# Patient Record
Sex: Male | Born: 1945 | Race: Black or African American | Hispanic: No | Marital: Married | Smoking: Former smoker
Health system: Southern US, Community
[De-identification: ages and names within clinical notes are randomized; demographics above are authoritative.]

## PROBLEM LIST (undated history)

## (undated) DIAGNOSIS — I509 Heart failure, unspecified: Secondary | ICD-10-CM

## (undated) DIAGNOSIS — I10 Essential (primary) hypertension: Secondary | ICD-10-CM

## (undated) DIAGNOSIS — E7800 Pure hypercholesterolemia, unspecified: Secondary | ICD-10-CM

## (undated) DIAGNOSIS — E119 Type 2 diabetes mellitus without complications: Secondary | ICD-10-CM

## (undated) DIAGNOSIS — R600 Localized edema: Secondary | ICD-10-CM

## (undated) DIAGNOSIS — K635 Polyp of colon: Secondary | ICD-10-CM

## (undated) DIAGNOSIS — K760 Fatty (change of) liver, not elsewhere classified: Secondary | ICD-10-CM

## (undated) HISTORY — DX: Fatty (change of) liver, not elsewhere classified: K76.0

## (undated) HISTORY — PX: COLONOSCOPY: SHX174

## (undated) HISTORY — DX: Localized edema: R60.0

## (undated) HISTORY — PX: HERNIA REPAIR: SHX51

---

## 2005-10-16 ENCOUNTER — Ambulatory Visit (HOSPITAL_COMMUNITY): Admission: RE | Admit: 2005-10-16 | Discharge: 2005-10-16 | Payer: Self-pay | Admitting: Internal Medicine

## 2005-10-16 ENCOUNTER — Encounter (INDEPENDENT_AMBULATORY_CARE_PROVIDER_SITE_OTHER): Payer: Self-pay | Admitting: Specialist

## 2005-10-16 ENCOUNTER — Ambulatory Visit: Payer: Self-pay | Admitting: Internal Medicine

## 2005-11-24 ENCOUNTER — Encounter: Payer: Self-pay | Admitting: Cardiology

## 2005-11-25 ENCOUNTER — Observation Stay (HOSPITAL_COMMUNITY): Admission: AD | Admit: 2005-11-25 | Discharge: 2005-11-26 | Payer: Self-pay | Admitting: Cardiology

## 2005-11-25 ENCOUNTER — Ambulatory Visit: Payer: Self-pay | Admitting: Cardiology

## 2005-11-25 ENCOUNTER — Ambulatory Visit: Payer: Self-pay | Admitting: Internal Medicine

## 2005-11-26 ENCOUNTER — Encounter: Payer: Self-pay | Admitting: Internal Medicine

## 2005-11-26 ENCOUNTER — Encounter: Payer: Self-pay | Admitting: Cardiology

## 2010-02-21 ENCOUNTER — Encounter: Payer: Self-pay | Admitting: Physician Assistant

## 2010-02-21 ENCOUNTER — Encounter: Payer: Self-pay | Admitting: Cardiology

## 2010-02-23 ENCOUNTER — Encounter: Payer: Self-pay | Admitting: Cardiology

## 2010-02-24 ENCOUNTER — Ambulatory Visit: Payer: Self-pay | Admitting: Cardiology

## 2010-04-21 DIAGNOSIS — E782 Mixed hyperlipidemia: Secondary | ICD-10-CM | POA: Insufficient documentation

## 2010-04-21 DIAGNOSIS — E7800 Pure hypercholesterolemia, unspecified: Secondary | ICD-10-CM | POA: Insufficient documentation

## 2010-04-21 DIAGNOSIS — I10 Essential (primary) hypertension: Secondary | ICD-10-CM | POA: Insufficient documentation

## 2010-04-21 DIAGNOSIS — E119 Type 2 diabetes mellitus without complications: Secondary | ICD-10-CM | POA: Insufficient documentation

## 2010-04-21 DIAGNOSIS — E876 Hypokalemia: Secondary | ICD-10-CM | POA: Insufficient documentation

## 2010-04-21 DIAGNOSIS — E785 Hyperlipidemia, unspecified: Secondary | ICD-10-CM

## 2010-04-21 DIAGNOSIS — I2510 Atherosclerotic heart disease of native coronary artery without angina pectoris: Secondary | ICD-10-CM | POA: Insufficient documentation

## 2010-04-21 DIAGNOSIS — E1100 Type 2 diabetes mellitus with hyperosmolarity without nonketotic hyperglycemic-hyperosmolar coma (NKHHC): Secondary | ICD-10-CM | POA: Insufficient documentation

## 2010-04-21 DIAGNOSIS — Z87891 Personal history of nicotine dependence: Secondary | ICD-10-CM | POA: Insufficient documentation

## 2010-04-21 DIAGNOSIS — Z9119 Patient's noncompliance with other medical treatment and regimen: Secondary | ICD-10-CM | POA: Insufficient documentation

## 2010-04-22 ENCOUNTER — Encounter (INDEPENDENT_AMBULATORY_CARE_PROVIDER_SITE_OTHER): Payer: Self-pay | Admitting: *Deleted

## 2010-07-31 NOTE — Consult Note (Signed)
Summary: CARDIOLOGY CONSULT/ [place] [facility]/ [place]   Imported By: Delfino Lovett 04/21/2010 16:53:17  _____________________________________________________________________  External Attachment:    Type:   Image     Comment:   External Document

## 2010-07-31 NOTE — Letter (Signed)
Summary: Appointment - Missed  [facility] [place]. [address] [place] [address]   Phone: [phone]  Fax: [phone]     April 22, 2010 MRN: [id number]   [facility] [place], [place]  [address]   Dear Gerald Evans,  Our records indicate you missed your appointment on        04/22/10                with Dr.    Dannielle Burn         It is very important that we reach you to reschedule this appointment. We look forward to participating in your health care needs. Please contact us at the number listed above at your earliest convenience to reschedule this appointment.     Sincerely,    Public relations account executive

## 2010-07-31 NOTE — Letter (Signed)
Summary: Discharge Summary  Discharge Summary   Imported By: Bartholomew Boards 04/21/2010 16:46:33  _____________________________________________________________________  External Attachment:    Type:   Image     Comment:   External Document

## 2010-07-31 NOTE — Consult Note (Signed)
Summary: CARDIOLOGY CONSULT/ [place] [facility]/ [place]   Imported By: Delfino Lovett 04/21/2010 16:52:54  _____________________________________________________________________  External Attachment:    Type:   Image     Comment:   External Document

## 2010-07-31 NOTE — Letter (Signed)
Summary: Discharge Summary/ [facility]  Discharge Summary/ [facility]   Imported By: Bartholomew Boards 04/21/2010 16:48:49  _____________________________________________________________________  External Attachment:    Type:   Image     Comment:   External Document

## 2010-07-31 NOTE — Cardiovascular Report (Signed)
Summary: Cardiac Catheterization  Cardiac Catheterization   Imported By: Bartholomew Boards 04/21/2010 16:44:53  _____________________________________________________________________  External Attachment:    Type:   Image     Comment:   External Document

## 2010-11-14 NOTE — Op Note (Signed)
Gerald Evans, Gerald Evans               ACCOUNT NO.:  [id number]   MEDICAL RECORD NO.:  [id number]          PATIENT TYPE:  AMB   LOCATION:  DAY                           FACILITY:  [facility]   PHYSICIAN:  Hildred Laser, M.D.    DATE OF BIRTH:  1946/05/10   DATE OF PROCEDURE:  10/16/2005  DATE OF DISCHARGE:                                 OPERATIVE REPORT   PROCEDURE PERFORMED:  Colonoscopy with polypectomy.   INDICATIONS FOR PROCEDURE:  Gerald Evans is a 65 year old African-American male  who was undergoing screening colonoscopy.  Family history is negative for  colorectal cancer.  Procedure risks were reviewed with the patient and  informed consent was obtained.   MEDS FOR CONSCIOUS SEDATION:  Demerol 50 mg IV, Versed 4 mg IV.   DESCRIPTION OF PROCEDURE:  Procedure performed in endoscopy suite.  Patient's vital signs and oxygen saturation were monitored during the  procedure and remained stable.  Patient was placed left lateral position and  rectal examination performed.  No abnormality noted on external or digital  exam.  Olympus video scope was placed in rectum and advanced under vision  into sigmoid colon and beyond.  Preparation was excellent.  Redundant colon.  The scope was easily passed to the cecum which was identified by the  ileocecal valve and appendiceal orifice.  The cecum was normal.  As the  scope was withdrawn, colonic  mucosa was carefully examined.  There was a  sessile polyp with bilobed appearance at __________.  There were two tiny  diverticula seen on the way in and a small lipoma approximately of proximal  sigmoid colon.  As the scope was withdrawn, colonic mucosa was carefully  examined.  A 5 mm polyp was snared from the distal descending colon and  another 7 to 8 mm sessile polyp was snared from the sigmoid colon.  Both of  these polyps were retrieved but they broke into pieces as they were  suctioned through the channel.  Mucosa of the rest of the colon was normal.  Rectal mucosa similarly was normal.  Scope was retroflexed to examine  anorectal junction and small hemorrhoids were noted below the dentate line.  Endoscope was straightened and withdrawn.  The patient tolerated the  procedure well.   FINAL DIAGNOSES:  1.  Examination performed to cecum.  2.  A 5 mm polyp snared from descending colon.  3.  7 to 8 mm polyp snared from sigmoid colon.  4.  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  5.  External hemorrhoids.   RECOMMENDATIONS:  1.  Standard instructions given.  2.  High fiber diet.  3.  I will be contacting the patient with biopsy results and further      recommendations.  Since both of these polyps appear to be adenomatous,      he will need to return in five years.  We will make final      recommendations after histology reviewed.      Hildred Laser, M.D.  Electronically Signed     NR/MEDQ  D:  10/16/2005  T:  10/17/2005  Job:  [id number]   cc:   Matthias Hughs  Fax: [phone]

## 2010-11-14 NOTE — Cardiovascular Report (Signed)
[name], [name]               ACCOUNT NO.:  [id number]   MEDICAL RECORD NO.:  [id number]          PATIENT TYPE:  INP   LOCATION:  6529                         FACILITY:  [facility]   PHYSICIAN:  Glori Bickers, M.D. LHCDATE OF BIRTH:  July 22, 1945   DATE OF PROCEDURE:  11/26/2005  DATE OF DISCHARGE:                              CARDIAC CATHETERIZATION   PRIMARY CARE PHYSICIAN:  Matthias Hughs, M.D. in [place].   CARDIOLOGIST:  Ernestine Mcmurray, M.D.   PATIENT IDENTIFICATION:  Gerald Evans is a delightful 65 year old male with a  history of hyperlipidemia, hypertension and recent onset of diabetes.  He  has had a week or two of substernal chest discomfort, as well as some  dyspnea on exertion.  He was admitted to [facility].  He ruled out for  myocardial infarction with serial cardiac markers.  EKG was nonacute.  He  was referred for cardiac catheterization.   PROCEDURES PERFORMED:  1.  Selective coronary angiography.  2.  Left heart cath.  3.  Left ventriculogram.  4.  Angio-Seal closure device.   DESCRIPTION OF PROCEDURE:  The risks and benefits of the catheterization  were explained.  Consent was signed and placed in the chart.  A 6-French  arterial sheath was placed in the right femoral artery using a modified  Seldinger technique.  Standard catheters including preformed Judkins JL-4,  JR-4 and angled pigtail were used for the catheterization.  All catheter  exchanges were made over wire.  There were no apparent complications.  At  the end the procedure, the right femoral arteriotomy site was closed using  Angio-Seal closure device.  There was good hemostasis.  No apparent  complications.   Central aortic pressure 122/75, a mean of 96.  LV 119/1 with an EDP of 9.  There was no aortic stenosis.   Left main was normal.   LAD was a very large vessel wrapping the apex.  It gave off a single  diagonal in the midsection.  There was some mild plaquing of about 20% in  the  mid-to-distal LAD, but no obstructive lesions.   Left circumflex gave off of a large branching ramus, moderate-sized OM-1, a  small OM-2.  There was some mild 20% plaquing in the ramus, but otherwise no  significant disease.   Right coronary artery was a moderate-sized vessel.  It gave off an RV branch  and acute marginal branch, a small PDA and a small PL.  There was a 30-40%  lesion proximally.   The left ventriculogram done in the RAO position showed an EF of about 50%  with no wall motion abnormalities and no mitral regurgitation.   ASSESSMENT:  1.  Minimal nonobstructive coronary artery disease.  2.  Low normal left ventricular function with normal filling pressures.   PLAN:  1.  Continue aggressive risk factor management.  I have encouraged him to      get his diabetes under good control with weight loss and exercise, and      also follow his cholesterol and blood pressure closely.  We will also  check a D-dimer to rule out the possibility of pulmonary emboli.  2.  He should be stable to go home later today if his groin is stable and      his D-dimer is not elevated.      Glori Bickers, M.D. [facility]  Electronically Signed     DB/MEDQ  D:  11/26/2005  T:  11/26/2005  Job:  [id number]   cc:   Ernestine Mcmurray, M.D. [facility]  [address]  [address] [place] [address]   David Tapper  Fax: [phone]

## 2010-11-14 NOTE — Discharge Summary (Signed)
Gerald Evans, Gerald Evans               ACCOUNT NO.:  [id number]   MEDICAL RECORD NO.:  [id number]          PATIENT TYPE:  INP   LOCATION:  2852                         FACILITY:  [facility]   PHYSICIAN:  Gerald Bickers, M.D. LHCDATE OF BIRTH:  September 07, 1945   DATE OF ADMISSION:  11/25/2005  DATE OF DISCHARGE:  11/26/2005                                 DISCHARGE SUMMARY   PROCEDURES:  1.  Cardiac catheterization.  2.  Coronary arteriogram.  3.  Left ventriculogram.   DISCHARGE DIAGNOSES:  Chest pain status post cardiac catheterization this  admission with minimal coronary artery disease.   SECONDARY DIAGNOSES:  1.  Hypertension.  2.  Hyperlipidemia.  3.  Mild anemia with a hemoglobin of 11.5, hematocrit 35.3 this admission.  4.  Status post umbilical hernia repair.  5.  No allergies.  6.  Family history of coronary artery disease in a brother.   Evans COURSE:  Gerald Evans is a 65 year old male with no previous history  of coronary artery disease.  He went to Gerald Evans on Nov 24, 2005,  for chest pain.  His symptoms were concerning for unstable anginal pain and  he has multiple cardiac risk factors.  He was transferred to Gerald Evans for further evaluation and cardiac catheterization.   The cardiac enzymes were negative for MI.  A cardiac catheterization showed  a 20% distal LAD, 20% ramus, and a 30-40% proximal RCA.  His EF was 50% with  no wall motion abnormalities or MR.  Gerald Evans evaluated the films and  felt that he had minimal nonobstructive coronary artery disease and low  normal EF.  It was felt that a D-dimer should be checked to rule out PE and  aggressive risk factor management should be pursued.   The D-dimer was less than 0.22.  A lipid profile was performed which showed  a total cholesterol 183, triglycerides 89, HDL 49, LDL 116.   Post cath, Gerald Evans was ambulating without chest pain or shortness of  breath.  He was considered stable for  discharge on Nov 26, 2005, and is to  follow up as an outpatient with Gerald Evans and with Gerald Evans in the [place]  office on a p.r.n. basis.   DISCHARGE INSTRUCTIONS:  His activity is to be increased gradually.  He is  to stick to a low fat diabetic diet.  He is to call our office for any  problems with the cath site.  He is to follow up with Gerald Evans and call  for appointment and follow up with Gerald Evans as needed.   DISCHARGE MEDICATIONS:  1.  Lipitor 10 mg daily (consider increasing as goal LDL optimally is less      than 70).  2.  Metformin 500 mg daily, restart November 29, 2005.  3.  Triamterene HCTZ as at home.  4.  Norvasc 5 mg daily.  5.  Altace to 0.5 mg daily.  6.  Potassium 20 mEq daily.  7.  Metoprolol 50 mg b.i.d.  8.  Coated aspirin 81 mg a day.  Gerald Ferries, P.A. [facility]      Gerald Bickers, M.D. [facility]  Electronically Signed    RB/MEDQ  D:  11/26/2005  T:  11/26/2005  Job:  [id number]   cc:   Gerald Evans  Fax: [phone]

## 2012-01-20 DIAGNOSIS — I10 Essential (primary) hypertension: Secondary | ICD-10-CM

## 2012-01-26 ENCOUNTER — Telehealth (INDEPENDENT_AMBULATORY_CARE_PROVIDER_SITE_OTHER): Payer: Self-pay | Admitting: *Deleted

## 2012-01-26 ENCOUNTER — Telehealth: Payer: Self-pay

## 2012-01-26 NOTE — Telephone Encounter (Signed)
LM for pt to call

## 2012-01-26 NOTE — Telephone Encounter (Signed)
Patient calls this morning stating he got a letter but does not have it and thinks he was about doing his TCS again.  He last had it 09/2005.    If you would look and see if we sent letter if not then he may need one and he was due I believe back 2007.

## 2012-01-27 NOTE — Telephone Encounter (Signed)
Pt returned call. I explained to him that I had a referral from Dr. Legrand Rams and he had his last colonoscopy in 09/2005. He needs OV. But if he wants Dr. Laural Golden to do the procedure he will not need OV here. He said to let him discuss with his wife and he will get back in touch with me. I gave him Dr. Olevia Perches phone number if that is what he wants. He said that he will let me know.

## 2012-02-01 ENCOUNTER — Telehealth (INDEPENDENT_AMBULATORY_CARE_PROVIDER_SITE_OTHER): Payer: Self-pay | Admitting: *Deleted

## 2012-02-01 ENCOUNTER — Other Ambulatory Visit (INDEPENDENT_AMBULATORY_CARE_PROVIDER_SITE_OTHER): Payer: Self-pay | Admitting: *Deleted

## 2012-02-01 DIAGNOSIS — Z1211 Encounter for screening for malignant neoplasm of colon: Secondary | ICD-10-CM

## 2012-02-01 DIAGNOSIS — Z860100 Personal history of colon polyps, unspecified: Secondary | ICD-10-CM

## 2012-02-01 MED ORDER — PEG 3350-KCL-NA BICARB-NACL 420 G PO SOLR: 4000.0000 mL | Freq: Once | ORAL | Status: AC

## 2012-02-01 NOTE — Telephone Encounter (Signed)
Patient needs movi prep 

## 2012-02-01 NOTE — Telephone Encounter (Signed)
TCS sch'd 03/16/12, patient aware

## 2012-02-02 NOTE — Telephone Encounter (Signed)
Pt is scheduled for colonoscopy with Dr. Laural Golden on 03/16/2012.

## 2012-02-24 ENCOUNTER — Telehealth (INDEPENDENT_AMBULATORY_CARE_PROVIDER_SITE_OTHER): Payer: Self-pay | Admitting: *Deleted

## 2012-02-24 NOTE — Telephone Encounter (Signed)
PCP/Requesting MD: fanta  Name & DOB: Gerald Evans 05/14/1946     Procedure: tcs  Reason/Indication:  Hx polyps  Has patient had this procedure before?  yes  If so, when, by whom and where?  2007  Is there a family history of colon cancer?  no  Who?  What age when diagnosed?    Is patient diabetic?   yes      Does patient have prosthetic heart valve?  no  Do you have a pacemaker?  no  Has patient had joint replacement within last 12 months?  no  Is patient on Coumadin, Plavix and/or Aspirin? yes  Medications: lisinopril 20 mg daily, metoprolol 100 mg bid, metformin 500 mg daily, asa 81 mg daily  Allergies: nkda  Medication Adjustment: asa 2 days, 1/2 metformin day before  Procedure date & time: 03/16/12 at 1200

## 2012-02-26 ENCOUNTER — Telehealth (INDEPENDENT_AMBULATORY_CARE_PROVIDER_SITE_OTHER): Payer: Self-pay | Admitting: *Deleted

## 2012-02-26 DIAGNOSIS — Z1211 Encounter for screening for malignant neoplasm of colon: Secondary | ICD-10-CM

## 2012-02-26 MED ORDER — PEG 3350-KCL-NA BICARB-NACL 420 G PO SOLR: 4000.0000 mL | Freq: Once | ORAL | Status: AC

## 2012-02-26 NOTE — Telephone Encounter (Signed)
Patient needs trilyte 

## 2012-02-26 NOTE — Telephone Encounter (Signed)
agree

## 2012-03-07 ENCOUNTER — Encounter (HOSPITAL_COMMUNITY): Payer: Self-pay | Admitting: Pharmacy Technician

## 2012-03-15 MED ORDER — SODIUM CHLORIDE 0.45 % IV SOLN: Freq: Once | INTRAVENOUS | Status: DC

## 2012-03-16 ENCOUNTER — Encounter (HOSPITAL_COMMUNITY): Admission: RE | Payer: Self-pay | Source: Ambulatory Visit

## 2012-03-16 ENCOUNTER — Ambulatory Visit (HOSPITAL_COMMUNITY): Admission: RE | Admit: 2012-03-16 | Payer: Medicare Other | Source: Ambulatory Visit | Admitting: Internal Medicine

## 2012-03-16 ENCOUNTER — Encounter (INDEPENDENT_AMBULATORY_CARE_PROVIDER_SITE_OTHER): Payer: Self-pay | Admitting: *Deleted

## 2012-03-16 ENCOUNTER — Other Ambulatory Visit (INDEPENDENT_AMBULATORY_CARE_PROVIDER_SITE_OTHER): Payer: Self-pay | Admitting: *Deleted

## 2012-03-16 DIAGNOSIS — Z86010 Personal history of colonic polyps: Secondary | ICD-10-CM

## 2012-03-16 SURGERY — COLONOSCOPY
Anesthesia: Moderate Sedation

## 2012-03-16 NOTE — Telephone Encounter (Signed)
This encounter was created in error - please disregard.

## 2012-03-22 ENCOUNTER — Encounter (HOSPITAL_COMMUNITY): Payer: Self-pay | Admitting: Dietician

## 2012-03-22 NOTE — Progress Notes (Signed)
[facility] Diabetes Class Completion  Date:March 22, 2012  Time: 10:00 AM  Pt attended [facility]'s Diabetes Group Education Class on March 22, 2012.   Patient was educated on the following topics: carbohydrate metabolism in relation to diabetes, sources of carbohydrate, carbohydrate counting, meal planning strategies, food label reading, and portion control.   Joaquim Lai, RD, LDN

## 2012-04-06 ENCOUNTER — Telehealth (INDEPENDENT_AMBULATORY_CARE_PROVIDER_SITE_OTHER): Payer: Self-pay | Admitting: *Deleted

## 2012-04-06 NOTE — Telephone Encounter (Signed)
PCP/Requesting MD: fanta  Name & DOB: Gerald Evans October 12, 1945   Procedure: tcs  Reason/Indication:  Hx polyps  Has patient had this procedure before?  yes  If so, when, by whom and where?  2007  Is there a family history of colon cancer?  no  Who?  What age when diagnosed?    Is patient diabetic?   yes      Does patient have prosthetic heart valve?  no  Do you have a pacemaker?  no  Has patient had joint replacement within last 12 months?  no  Is patient on Coumadin, Plavix and/or Aspirin? yes  Medications: lisinopril 20 mg daily, metoprolol 100 mg bid, metformin 500 mg daily, asa 81 mg daily  Allergies: nkda  Medication Adjustment: asa 2 days, 1/2 metformin day before  Procedure date & time: 04/21/12 at 1200

## 2012-04-07 NOTE — Telephone Encounter (Signed)
agaree

## 2012-04-20 MED ORDER — SODIUM CHLORIDE 0.45 % IV SOLN
INTRAVENOUS | Status: DC
  Administered 2012-04-21: 12:00:00 via INTRAVENOUS

## 2012-04-21 ENCOUNTER — Ambulatory Visit (HOSPITAL_COMMUNITY)
Admission: RE | Admit: 2012-04-21 | Discharge: 2012-04-21 | Disposition: A | Discharge status: Home / Self Care | Payer: Medicare Other | Source: Ambulatory Visit | Attending: Internal Medicine | Admitting: Internal Medicine

## 2012-04-21 ENCOUNTER — Encounter (HOSPITAL_COMMUNITY)
Admission: RE | Disposition: A | Discharge status: Home / Self Care | Payer: Self-pay | Source: Ambulatory Visit | Attending: Internal Medicine

## 2012-04-21 ENCOUNTER — Encounter (HOSPITAL_COMMUNITY): Payer: Self-pay | Admitting: *Deleted

## 2012-04-21 DIAGNOSIS — E119 Type 2 diabetes mellitus without complications: Secondary | ICD-10-CM | POA: Insufficient documentation

## 2012-04-21 DIAGNOSIS — Z01812 Encounter for preprocedural laboratory examination: Secondary | ICD-10-CM | POA: Insufficient documentation

## 2012-04-21 DIAGNOSIS — Z860100 Personal history of colon polyps, unspecified: Secondary | ICD-10-CM | POA: Insufficient documentation

## 2012-04-21 DIAGNOSIS — D126 Benign neoplasm of colon, unspecified: Secondary | ICD-10-CM

## 2012-04-21 DIAGNOSIS — I10 Essential (primary) hypertension: Secondary | ICD-10-CM | POA: Insufficient documentation

## 2012-04-21 HISTORY — PX: COLONOSCOPY: SHX5424

## 2012-04-21 HISTORY — DX: Type 2 diabetes mellitus without complications: E11.9

## 2012-04-21 HISTORY — DX: Polyp of colon: K63.5

## 2012-04-21 HISTORY — DX: Essential (primary) hypertension: I10

## 2012-04-21 LAB — GLUCOSE, CAPILLARY

## 2012-04-21 SURGERY — COLONOSCOPY
Anesthesia: Moderate Sedation

## 2012-04-21 MED ORDER — MEPERIDINE HCL 50 MG/ML IJ SOLN
INTRAMUSCULAR | Status: AC
  Filled 2012-04-21: qty 1

## 2012-04-21 MED ORDER — MIDAZOLAM HCL 5 MG/5ML IJ SOLN
INTRAMUSCULAR | Status: DC | PRN
  Administered 2012-04-21: 2 mg via INTRAVENOUS
  Administered 2012-04-21: 1 mg via INTRAVENOUS
  Administered 2012-04-21: 2 mg via INTRAVENOUS

## 2012-04-21 MED ORDER — STERILE WATER FOR IRRIGATION IR SOLN
Status: DC | PRN
  Administered 2012-04-21: 12:00:00

## 2012-04-21 MED ORDER — MEPERIDINE HCL 50 MG/ML IJ SOLN
INTRAMUSCULAR | Status: DC | PRN
  Administered 2012-04-21 (×2): 25 mg via INTRAVENOUS

## 2012-04-21 MED ORDER — MIDAZOLAM HCL 5 MG/5ML IJ SOLN
INTRAMUSCULAR | Status: AC
  Filled 2012-04-21: qty 5

## 2012-04-21 NOTE — Op Note (Signed)
COLONOSCOPY PROCEDURE REPORT  PATIENT:  Gerald Evans  MR#:  [id number] Birthdate:  October 03, 1945, 66 y.o., male Endoscopist:  Dr. Rogene Houston, MD Referred By:  Dr. Rosita Fire, M.D. Procedure Date: 04/21/2012  Procedure:   Colonoscopy with snare polypectomy.  Indications:  Patient is 66 year old male with history of colonic adenomas. He is undergoing surveillance colonoscopy.  Informed Consent:  The procedure and risks were reviewed with the patient and informed consent was obtained.  Medications:  Demerol 50 mg IV Versed 5 mg IV  Description of procedure:  After a digital rectal exam was performed, that colonoscope was advanced from the anus through the rectum and colon to the area of the cecum, ileocecal valve and appendiceal orifice. The cecum was deeply intubated. These structures were well-seen and photographed for the record. From the level of the cecum and ileocecal valve, the scope was slowly and cautiously withdrawn. The mucosal surfaces were carefully surveyed utilizing scope tip to flexion to facilitate fold flattening as needed. The scope was pulled down into the rectum where a thorough exam including retroflexion was performed.  Findings:   Prep excellent. Redundant colon. 5 mm polyp snared from left edge of ileocecal valve. Mucosa of the rest of the colon was normal. Normal rectal mucosa and anal rectal junction.  Therapeutic/Diagnostic Maneuvers Performed:  See above  Complications:  None  Cecal Withdrawal Time:   17 minutes  Impression:  Examination performed to cecum. Redundant colon. 5 mm cecal polyp snared.  Recommendations:  Standard instructions given. I would be contacting patient with biopsy results and further recommendations.  REHMAN,NAJEEB U  04/21/2012 12:27 PM  CC: Dr. Rosita Fire, MD & Dr. Rayne Du ref. provider found

## 2012-04-21 NOTE — H&P (Signed)
Gerald Evans is an 66 y.o. male.   Chief Complaint: Patient is here for colonoscopy. HPI: Patient is 66 year old Caucasian male who is undergoing surveillance colonoscopy. His last exam was in April 2007 with removal of 2 adenomas. He denies abdominal pain, change in his bowel habits or rectal bleeding. He has good appetite and his weight has been stable. Family history is negative for colorectal carcinoma.  Past Medical History  Diagnosis Date  . Hypertension   . Diabetes mellitus without complication   . Colon polyps     Past Surgical History  Procedure Date  . Colonoscopy   . Hernia repair     Family History  Problem Relation Age of Onset  . Colon cancer Neg Hx    Social History:  reports that he has quit smoking. His smoking use included Cigarettes. He has a 10 pack-year smoking history. He does not have any smokeless tobacco history on file. He reports that he drinks about 2 ounces of alcohol per week. He reports that he does not use illicit drugs.  Allergies: No Known Allergies  Medications Prior to Admission  Medication Sig Dispense Refill  . amLODipine (NORVASC) 5 MG tablet Take 5 mg by mouth daily.      Marland Kitchen linagliptin (TRADJENTA) 5 MG TABS tablet Take 5 mg by mouth daily.      Marland Kitchen lisinopril (PRINIVIL,ZESTRIL) 40 MG tablet Take 40 mg by mouth daily.      . metoprolol (LOPRESSOR) 100 MG tablet Take 100 mg by mouth daily.       Marland Kitchen aspirin EC 81 MG tablet Take 81 mg by mouth daily.        Results for orders placed during the hospital encounter of 04/21/12 (from the past 48 hour(s))  GLUCOSE, CAPILLARY     Status: Abnormal   Collection Time   04/21/12 11:37 AM      Component Value Range Comment   Glucose-Capillary 125 (*) 70 - 99 mg/dL    Comment 1 Documented in Chart      No results found.  ROS  Blood pressure 155/75, pulse 50, temperature 97.9 F (36.6 C), temperature source Oral, resp. rate 23, height 5' 8.5" (1.74 m), weight 210 lb (95.255 kg), SpO2  97.00%. Physical Exam  Constitutional: He appears well-developed and well-nourished.  HENT:  Mouth/Throat: Oropharynx is clear and moist.  Eyes: Conjunctivae normal are normal. No scleral icterus.  Neck: No thyromegaly present.  Cardiovascular: Normal rate, regular rhythm and normal heart sounds.   No murmur heard. Respiratory: Effort normal and breath sounds normal.  GI: Soft. He exhibits no distension and no mass. There is no tenderness.       Horizontal scar below umbilicus  Lymphadenopathy:    He has no cervical adenopathy.  Skin: Skin is warm and dry.     Assessment/Plan History of colonic adenomas. Surveillance colonoscopy.  Alisa Stjames U 04/21/2012, 11:46 AM

## 2012-04-27 ENCOUNTER — Encounter (HOSPITAL_COMMUNITY): Payer: Self-pay | Admitting: Internal Medicine

## 2012-05-02 ENCOUNTER — Encounter (INDEPENDENT_AMBULATORY_CARE_PROVIDER_SITE_OTHER): Payer: Self-pay | Admitting: *Deleted

## 2013-04-23 ENCOUNTER — Emergency Department (HOSPITAL_COMMUNITY)
Admission: EM | Admit: 2013-04-23 | Discharge: 2013-04-23 | Disposition: A | Discharge status: Home / Self Care | Payer: Medicare Other | Attending: Emergency Medicine | Admitting: Emergency Medicine

## 2013-04-23 ENCOUNTER — Encounter (HOSPITAL_COMMUNITY): Payer: Self-pay | Admitting: Emergency Medicine

## 2013-04-23 ENCOUNTER — Emergency Department (HOSPITAL_COMMUNITY): Payer: Medicare Other

## 2013-04-23 DIAGNOSIS — Z860100 Personal history of colon polyps, unspecified: Secondary | ICD-10-CM | POA: Insufficient documentation

## 2013-04-23 DIAGNOSIS — M25562 Pain in left knee: Secondary | ICD-10-CM

## 2013-04-23 DIAGNOSIS — I10 Essential (primary) hypertension: Secondary | ICD-10-CM | POA: Insufficient documentation

## 2013-04-23 DIAGNOSIS — M25569 Pain in unspecified knee: Secondary | ICD-10-CM | POA: Insufficient documentation

## 2013-04-23 DIAGNOSIS — E119 Type 2 diabetes mellitus without complications: Secondary | ICD-10-CM | POA: Insufficient documentation

## 2013-04-23 DIAGNOSIS — M25469 Effusion, unspecified knee: Secondary | ICD-10-CM | POA: Insufficient documentation

## 2013-04-23 DIAGNOSIS — Z7982 Long term (current) use of aspirin: Secondary | ICD-10-CM | POA: Insufficient documentation

## 2013-04-23 DIAGNOSIS — Z79899 Other long term (current) drug therapy: Secondary | ICD-10-CM | POA: Insufficient documentation

## 2013-04-23 DIAGNOSIS — Z87891 Personal history of nicotine dependence: Secondary | ICD-10-CM | POA: Insufficient documentation

## 2013-04-23 MED ORDER — HYDROCODONE-ACETAMINOPHEN 5-325 MG PO TABS: 2.0000 | ORAL_TABLET | ORAL | Status: DC | PRN

## 2013-04-23 MED ORDER — MELOXICAM 15 MG PO TABS: 15.0000 mg | ORAL_TABLET | Freq: Every day | ORAL | Status: DC

## 2013-04-23 NOTE — ED Provider Notes (Signed)
CSN: [id number]     Arrival date & time 04/23/13  1744 History   First MD Initiated Contact with Patient 04/23/13 1904     This chart was scribed for non-physician practitioner, Alvina Chou PA-C, working with Leota Jacobsen, MD by Forrestine Him, ED Scribe. This patient was seen in room WTR8/WTR8 and the patient's care was started at 7:51 PM.   Chief Complaint  Patient presents with  . Knee Pain   Patient is a 67 y.o. male presenting with knee pain. The history is provided by the patient. No language interpreter was used.  Knee Pain  HPI Comments: Gerald Evans is a 67 y.o. male who presents to the Emergency Department complaining of left knee pain that started about a week ago. He also reports associated swelling in his left knee. Pt states on 10/21 he was bending over, and felt a "pull" in his knee. He states he is unable to bare weight on his left leg. Pt says he has not tried anything OTC for the pain. Pt denies numbness or tingling.  Past Medical History  Diagnosis Date  . Hypertension   . Diabetes mellitus without complication   . Colon polyps    Past Surgical History  Procedure Laterality Date  . Colonoscopy    . Hernia repair    . Colonoscopy  04/21/2012    Procedure: COLONOSCOPY;  Surgeon: Rogene Houston, MD;  Location: [facility];  Service: Endoscopy;  Laterality: N/A;  1200   Family History  Problem Relation Age of Onset  . Colon cancer Neg Hx    History  Substance Use Topics  . Smoking status: Former Smoker -- 0.50 packs/day for 20 years    Types: Cigarettes  . Smokeless tobacco: Not on file  . Alcohol Use: 2.0 oz/week    4 drink(s) per week    Review of Systems  Musculoskeletal: Positive for arthralgias (knee pain).  All other systems reviewed and are negative.    Allergies  Review of patient's allergies indicates no known allergies.  Home Medications   Current Outpatient Rx  Name  Route  Sig  Dispense  Refill  . amLODipine (NORVASC) 5 MG  tablet   Oral   Take 5 mg by mouth daily.         Marland Kitchen aspirin EC 81 MG tablet   Oral   Take 81 mg by mouth daily.         Marland Kitchen linagliptin (TRADJENTA) 5 MG TABS tablet   Oral   Take 5 mg by mouth daily.         Marland Kitchen lisinopril (PRINIVIL,ZESTRIL) 40 MG tablet   Oral   Take 40 mg by mouth daily.         . metFORMIN (GLUCOPHAGE) 500 MG tablet   Oral   Take 500 mg by mouth at bedtime.         . metoprolol (LOPRESSOR) 100 MG tablet   Oral   Take 100 mg by mouth daily.           BP 160/68  Pulse 66  Temp(Src) 98.4 F (36.9 C) (Oral)  SpO2 95%  Physical Exam  Nursing note and vitals reviewed. Constitutional: He is oriented to person, place, and time. He appears well-developed and well-nourished.  HENT:  Head: Normocephalic and atraumatic.  Eyes: EOM are normal.  Neck: Normal range of motion.  Cardiovascular: Normal rate.   Pulmonary/Chest: Effort normal.  Musculoskeletal: Normal range of motion.  Generalized left knee  edema Medial knee tenderness to palpation No obvious deformity ROM limited due to pain and swelling  No calf tenderness to palpation   Neurological: He is alert and oriented to person, place, and time.  Skin: Skin is warm and dry.  Psychiatric: He has a normal mood and affect. His behavior is normal.    ED Course  Procedures (including critical care time)  DIAGNOSTIC STUDIES: Oxygen Saturation is 95% on RA, Adequate by my interpretation.    COORDINATION OF CARE: 7:17 PM- Will give pain medication. Will refer pt to orthopedic physician. Discussed treatment plan with pt at bedside and pt agreed to plan.     Labs Review Labs Reviewed - No data to display Imaging Review Dg Knee Complete 4 Views Left  04/23/2013   CLINICAL DATA:  Pain and swelling for 10 days  EXAM: LEFT KNEE - COMPLETE 4+ VIEW  COMPARISON:  None.  FINDINGS: Four views of left knee submitted. No acute fracture or subluxation. There is narrowing of medial joint compartment.  Spurring of medial femoral condyle and medial tibial plateau. Moderate joint effusion. Dystrophic soft tissue calcifications are noted. Narrowing of patellofemoral joint space.  IMPRESSION: No acute fracture or subluxation. Osteoarthritic changes as described above. Moderate joint effusion.   Electronically Signed   By: Lahoma Crocker M.D.   On: 04/23/2013 18:55    EKG Interpretation   None       MDM   1. Left knee pain     7:55 PM Xray shows moderate joint effusion. Patient will have Mobic for pain. Patient instructed to elevate and ice knee for pain and swelling relief. Vitals stable and patient afebrile. No neurovascular compromise or acute injury. I doubt septic joint at this time.   I personally performed the services described in this documentation, which was scribed in my presence. The recorded information has been reviewed and is accurate.    Alvina Chou, PA-C 04/23/13 2001

## 2013-04-23 NOTE — ED Notes (Signed)
Pt states that for more than a week now he has been having L knee pain and swelling. Denies numbness or tingling. Unable to walk on it. Feels like it 'slipped out.'

## 2013-04-23 NOTE — ED Provider Notes (Signed)
Medical screening examination/treatment/procedure(s) were performed by non-physician practitioner and as supervising physician I was immediately available for consultation/collaboration.   Leota Jacobsen, MD 04/23/13 [phone]

## 2015-05-14 DIAGNOSIS — E669 Obesity, unspecified: Secondary | ICD-10-CM | POA: Diagnosis not present

## 2015-05-14 DIAGNOSIS — E1165 Type 2 diabetes mellitus with hyperglycemia: Secondary | ICD-10-CM | POA: Diagnosis not present

## 2015-05-14 DIAGNOSIS — I10 Essential (primary) hypertension: Secondary | ICD-10-CM | POA: Diagnosis not present

## 2015-05-14 DIAGNOSIS — Z23 Encounter for immunization: Secondary | ICD-10-CM | POA: Diagnosis not present

## 2015-05-14 DIAGNOSIS — E785 Hyperlipidemia, unspecified: Secondary | ICD-10-CM | POA: Diagnosis not present

## 2015-08-13 DIAGNOSIS — E1165 Type 2 diabetes mellitus with hyperglycemia: Secondary | ICD-10-CM | POA: Diagnosis not present

## 2015-08-13 DIAGNOSIS — E785 Hyperlipidemia, unspecified: Secondary | ICD-10-CM | POA: Diagnosis not present

## 2015-08-13 DIAGNOSIS — Z6835 Body mass index (BMI) 35.0-35.9, adult: Secondary | ICD-10-CM | POA: Diagnosis not present

## 2015-08-13 DIAGNOSIS — I10 Essential (primary) hypertension: Secondary | ICD-10-CM | POA: Diagnosis not present

## 2015-08-16 DIAGNOSIS — E1165 Type 2 diabetes mellitus with hyperglycemia: Secondary | ICD-10-CM | POA: Diagnosis not present

## 2015-10-23 DIAGNOSIS — E785 Hyperlipidemia, unspecified: Secondary | ICD-10-CM | POA: Diagnosis not present

## 2015-10-23 DIAGNOSIS — E1165 Type 2 diabetes mellitus with hyperglycemia: Secondary | ICD-10-CM | POA: Diagnosis not present

## 2015-10-23 DIAGNOSIS — E119 Type 2 diabetes mellitus without complications: Secondary | ICD-10-CM | POA: Diagnosis not present

## 2015-10-23 DIAGNOSIS — Z6835 Body mass index (BMI) 35.0-35.9, adult: Secondary | ICD-10-CM | POA: Diagnosis not present

## 2015-10-23 DIAGNOSIS — I10 Essential (primary) hypertension: Secondary | ICD-10-CM | POA: Diagnosis not present

## 2016-02-19 DIAGNOSIS — E785 Hyperlipidemia, unspecified: Secondary | ICD-10-CM | POA: Diagnosis not present

## 2016-02-19 DIAGNOSIS — E119 Type 2 diabetes mellitus without complications: Secondary | ICD-10-CM | POA: Diagnosis not present

## 2016-02-19 DIAGNOSIS — E1165 Type 2 diabetes mellitus with hyperglycemia: Secondary | ICD-10-CM | POA: Diagnosis not present

## 2016-02-19 DIAGNOSIS — Z0000 Encounter for general adult medical examination without abnormal findings: Secondary | ICD-10-CM | POA: Diagnosis not present

## 2016-02-19 DIAGNOSIS — I10 Essential (primary) hypertension: Secondary | ICD-10-CM | POA: Diagnosis not present

## 2016-02-23 DIAGNOSIS — R69 Illness, unspecified: Secondary | ICD-10-CM | POA: Diagnosis not present

## 2016-02-24 DIAGNOSIS — R69 Illness, unspecified: Secondary | ICD-10-CM | POA: Diagnosis not present

## 2016-02-26 DIAGNOSIS — R69 Illness, unspecified: Secondary | ICD-10-CM | POA: Diagnosis not present

## 2016-03-27 DIAGNOSIS — R69 Illness, unspecified: Secondary | ICD-10-CM | POA: Diagnosis not present

## 2016-04-14 ENCOUNTER — Other Ambulatory Visit: Payer: Self-pay | Admitting: Internal Medicine

## 2016-04-14 ENCOUNTER — Other Ambulatory Visit (HOSPITAL_COMMUNITY): Payer: Self-pay | Admitting: Internal Medicine

## 2016-04-14 DIAGNOSIS — I10 Essential (primary) hypertension: Secondary | ICD-10-CM | POA: Diagnosis not present

## 2016-04-14 DIAGNOSIS — R188 Other ascites: Secondary | ICD-10-CM

## 2016-04-14 DIAGNOSIS — E1165 Type 2 diabetes mellitus with hyperglycemia: Secondary | ICD-10-CM | POA: Diagnosis not present

## 2016-04-14 DIAGNOSIS — R600 Localized edema: Secondary | ICD-10-CM | POA: Diagnosis not present

## 2016-04-17 ENCOUNTER — Ambulatory Visit (HOSPITAL_COMMUNITY)
Admission: RE | Admit: 2016-04-17 | Discharge: 2016-04-17 | Disposition: A | Discharge status: Home / Self Care | Payer: Medicare HMO | Source: Ambulatory Visit | Attending: Internal Medicine | Admitting: Internal Medicine

## 2016-04-17 DIAGNOSIS — R188 Other ascites: Secondary | ICD-10-CM | POA: Diagnosis present

## 2016-04-17 DIAGNOSIS — R932 Abnormal findings on diagnostic imaging of liver and biliary tract: Secondary | ICD-10-CM | POA: Insufficient documentation

## 2016-04-17 DIAGNOSIS — K7689 Other specified diseases of liver: Secondary | ICD-10-CM | POA: Insufficient documentation

## 2016-05-01 ENCOUNTER — Encounter (INDEPENDENT_AMBULATORY_CARE_PROVIDER_SITE_OTHER): Payer: Self-pay | Admitting: Internal Medicine

## 2016-05-01 ENCOUNTER — Encounter (INDEPENDENT_AMBULATORY_CARE_PROVIDER_SITE_OTHER): Payer: Self-pay

## 2016-05-13 ENCOUNTER — Encounter (INDEPENDENT_AMBULATORY_CARE_PROVIDER_SITE_OTHER): Payer: Self-pay | Admitting: *Deleted

## 2016-05-13 ENCOUNTER — Encounter (INDEPENDENT_AMBULATORY_CARE_PROVIDER_SITE_OTHER): Payer: Self-pay | Admitting: Internal Medicine

## 2016-05-13 ENCOUNTER — Ambulatory Visit (INDEPENDENT_AMBULATORY_CARE_PROVIDER_SITE_OTHER): Payer: Medicare HMO | Admitting: Internal Medicine

## 2016-05-13 VITALS — BP 140/80 | HR 63 | Resp 18 | Ht 68.0 in | Wt 238.1 lb

## 2016-05-13 DIAGNOSIS — K760 Fatty (change of) liver, not elsewhere classified: Secondary | ICD-10-CM | POA: Diagnosis not present

## 2016-05-13 NOTE — Patient Instructions (Signed)
Labs. And [place]  OV in 6 months.

## 2016-05-13 NOTE — Progress Notes (Signed)
   Subjective:    Patient ID: Gerald Evans, male    DOB: 1946/02/18, 70 y.o.   MRN: [id number]  HPI Referred by Dr. Legrand Rams for fatty liver. He was told by Dr. Legrand Rams to diet and loose weight. He tells me he feels good. No weight loss. His appetite is good.  Has a BM x 2 a day. No drugs.  No tattoos.  No GI symptoms . No bloating.  States he drank on the weekend (6-7 mixed drinks on the weekend). Has not drank in 2 weeks.  Diabetic x 10 yrs.     04/17/2016 US Abdomen:  IMPRESSION: 1. Liver is echogenic consistent fatty infiltration and/or hepatocellular disease.  2. 1.5 cm thinly septated cyst left hepatic lobe, this is most likely benign.  3.  No acute abnormality identified.  No ascites noted.  Review of Systems Past Medical History:  Diagnosis Date  . Colon polyps   . Diabetes mellitus without complication ([place])   . Hypertension     Past Surgical History:  Procedure Laterality Date  . COLONOSCOPY    . COLONOSCOPY  04/21/2012   Procedure: COLONOSCOPY;  Surgeon: Rogene Houston, MD;  Location: [facility];  Service: Endoscopy;  Laterality: N/A;  1200  . HERNIA REPAIR      No Known Allergies  Current Outpatient Prescriptions on File Prior to Visit  Medication Sig Dispense Refill  . amLODipine (NORVASC) 5 MG tablet Take 10 mg by mouth daily.     Marland Kitchen aspirin EC 81 MG tablet Take 81 mg by mouth daily.    Marland Kitchen lisinopril (PRINIVIL,ZESTRIL) 40 MG tablet Take 40 mg by mouth daily.    . metFORMIN (GLUCOPHAGE) 500 MG tablet Take 500 mg by mouth 2 (two) times daily with a meal.     . metoprolol (LOPRESSOR) 100 MG tablet Take 100 mg by mouth 2 (two) times daily.      No current facility-administered medications on file prior to visit.        Objective:   Physical Exam Blood pressure 140/80, pulse 63, resp. rate 18, height 5' 8"  (1.727 m), weight 238 lb 1.6 oz (108 kg).Alert and oriented. Skin warm and dry. Oral mucosa is moist.   . Sclera anicteric, conjunctivae is  pink. Thyroid not enlarged. No cervical lymphadenopathy. Lungs clear. Heart regular rate and rhythm.  Abdomen is soft. Bowel sounds are positive. No hepatomegaly. No abdominal masses felt. No tenderness.  No edema to lower extremities.        Assessment & Plan:  Fatty liver: Am going to get a [place] Elastrography. CBC and Hepatic function.  OV in 6 months if labs are normal. Patient know to diet and exercise.

## 2016-05-14 LAB — CBC WITH DIFFERENTIAL/PLATELET
BASOS PCT: 0 %
Basophils Absolute: 0 cells/uL (ref 0–200)
Eosinophils Absolute: 77 cells/uL (ref 15–500)
Eosinophils Relative: 1 %
HEMATOCRIT: 38.6 % (ref 38.5–50.0)
Hemoglobin: 12 g/dL — ABNORMAL LOW (ref 13.2–17.1)
LYMPHS ABS: 3157 {cells}/uL (ref 850–3900)
LYMPHS PCT: 41 %
MCH: 23.2 pg — ABNORMAL LOW (ref 27.0–33.0)
MCHC: 31.1 g/dL — ABNORMAL LOW (ref 32.0–36.0)
MCV: 74.5 fL — ABNORMAL LOW (ref 80.0–100.0)
MONO ABS: 616 {cells}/uL (ref 200–950)
MPV: 10.2 fL (ref 7.5–12.5)
Monocytes Relative: 8 %
NEUTROS ABS: 3850 {cells}/uL (ref 1500–7800)
Neutrophils Relative %: 50 %
PLATELETS: 236 10*3/uL (ref 140–400)
RBC: 5.18 MIL/uL (ref 4.20–5.80)
RDW: 17 % — AB (ref 11.0–15.0)
WBC: 7.7 10*3/uL (ref 3.8–10.8)

## 2016-05-14 LAB — HEPATIC FUNCTION PANEL
ALT: 11 U/L (ref 9–46)
AST: 16 U/L (ref 10–35)
Albumin: 4.4 g/dL (ref 3.6–5.1)
Alkaline Phosphatase: 66 U/L (ref 40–115)
BILIRUBIN DIRECT: 0.1 mg/dL (ref <=0.2)
BILIRUBIN INDIRECT: 0.2 mg/dL (ref 0.2–1.2)
Total Bilirubin: 0.3 mg/dL (ref 0.2–1.2)
Total Protein: 7.1 g/dL (ref 6.1–8.1)

## 2016-05-18 ENCOUNTER — Ambulatory Visit (HOSPITAL_COMMUNITY)
Admission: RE | Admit: 2016-05-18 | Discharge: 2016-05-18 | Disposition: A | Discharge status: Home / Self Care | Payer: Medicare HMO | Source: Ambulatory Visit | Attending: Internal Medicine | Admitting: Internal Medicine

## 2016-05-18 ENCOUNTER — Other Ambulatory Visit (INDEPENDENT_AMBULATORY_CARE_PROVIDER_SITE_OTHER): Payer: Self-pay | Admitting: Internal Medicine

## 2016-05-18 DIAGNOSIS — K760 Fatty (change of) liver, not elsewhere classified: Secondary | ICD-10-CM | POA: Insufficient documentation

## 2016-05-20 ENCOUNTER — Telehealth (INDEPENDENT_AMBULATORY_CARE_PROVIDER_SITE_OTHER): Payer: Self-pay | Admitting: Internal Medicine

## 2016-05-20 DIAGNOSIS — D508 Other iron deficiency anemias: Secondary | ICD-10-CM

## 2016-05-20 DIAGNOSIS — K760 Fatty (change of) liver, not elsewhere classified: Secondary | ICD-10-CM

## 2016-05-20 NOTE — Telephone Encounter (Signed)
Patient called, left a message, he would like to speak to [place] for second.  No details.  [phone]

## 2016-05-20 NOTE — Telephone Encounter (Signed)
Labs ordered. I have spoke with patient.

## 2016-05-20 NOTE — Telephone Encounter (Signed)
Diet and exercise.   

## 2016-05-25 DIAGNOSIS — D508 Other iron deficiency anemias: Secondary | ICD-10-CM | POA: Diagnosis not present

## 2016-05-25 DIAGNOSIS — K760 Fatty (change of) liver, not elsewhere classified: Secondary | ICD-10-CM | POA: Diagnosis not present

## 2016-05-26 LAB — SEDIMENTATION RATE: SED RATE: 7 mm/h (ref 0–20)

## 2016-05-26 LAB — IRON AND TIBC
%SAT: 24 % (ref 15–60)
Iron: 68 ug/dL (ref 50–180)
TIBC: 279 ug/dL (ref 250–425)
UIBC: 211 ug/dL (ref 125–400)

## 2016-05-26 LAB — VITAMIN B12: Vitamin B-12: 302 pg/mL (ref 200–1100)

## 2016-05-26 LAB — FERRITIN: FERRITIN: 187 ng/mL (ref 20–380)

## 2016-05-26 LAB — FOLATE: Folate: 16.9 ng/mL (ref >5.4)

## 2016-05-28 LAB — ANTI-SMOOTH MUSCLE ANTIBODY, IGG

## 2016-05-29 DIAGNOSIS — E1165 Type 2 diabetes mellitus with hyperglycemia: Secondary | ICD-10-CM | POA: Diagnosis not present

## 2016-05-29 DIAGNOSIS — I10 Essential (primary) hypertension: Secondary | ICD-10-CM | POA: Diagnosis not present

## 2016-05-29 DIAGNOSIS — K760 Fatty (change of) liver, not elsewhere classified: Secondary | ICD-10-CM | POA: Diagnosis not present

## 2016-05-29 DIAGNOSIS — Z6835 Body mass index (BMI) 35.0-35.9, adult: Secondary | ICD-10-CM | POA: Diagnosis not present

## 2016-05-29 DIAGNOSIS — Z23 Encounter for immunization: Secondary | ICD-10-CM | POA: Diagnosis not present

## 2016-06-10 ENCOUNTER — Other Ambulatory Visit (INDEPENDENT_AMBULATORY_CARE_PROVIDER_SITE_OTHER): Payer: Self-pay | Admitting: *Deleted

## 2016-06-10 DIAGNOSIS — B179 Acute viral hepatitis, unspecified: Secondary | ICD-10-CM | POA: Diagnosis not present

## 2016-06-12 LAB — CERULOPLASMIN: Ceruloplasmin: 33 mg/dL (ref 18–36)

## 2016-06-12 LAB — ALPHA-1-ANTITRYPSIN: A1 ANTITRYPSIN SER: 117 mg/dL (ref 83–199)

## 2016-06-21 DIAGNOSIS — R69 Illness, unspecified: Secondary | ICD-10-CM | POA: Diagnosis not present

## 2016-07-08 DIAGNOSIS — R69 Illness, unspecified: Secondary | ICD-10-CM | POA: Diagnosis not present

## 2016-08-19 ENCOUNTER — Emergency Department (HOSPITAL_COMMUNITY)
Admission: EM | Admit: 2016-08-19 | Discharge: 2016-08-19 | Disposition: A | Discharge status: Home / Self Care | Payer: Medicare HMO | Attending: Emergency Medicine | Admitting: Emergency Medicine

## 2016-08-19 ENCOUNTER — Encounter (HOSPITAL_COMMUNITY): Payer: Self-pay | Admitting: Cardiology

## 2016-08-19 ENCOUNTER — Emergency Department (HOSPITAL_COMMUNITY): Payer: Medicare HMO

## 2016-08-19 DIAGNOSIS — R1013 Epigastric pain: Secondary | ICD-10-CM

## 2016-08-19 DIAGNOSIS — R079 Chest pain, unspecified: Secondary | ICD-10-CM | POA: Diagnosis not present

## 2016-08-19 DIAGNOSIS — Z87891 Personal history of nicotine dependence: Secondary | ICD-10-CM | POA: Insufficient documentation

## 2016-08-19 DIAGNOSIS — Z79899 Other long term (current) drug therapy: Secondary | ICD-10-CM | POA: Insufficient documentation

## 2016-08-19 DIAGNOSIS — R0789 Other chest pain: Secondary | ICD-10-CM | POA: Diagnosis not present

## 2016-08-19 DIAGNOSIS — Z794 Long term (current) use of insulin: Secondary | ICD-10-CM | POA: Diagnosis not present

## 2016-08-19 DIAGNOSIS — I10 Essential (primary) hypertension: Secondary | ICD-10-CM | POA: Diagnosis not present

## 2016-08-19 DIAGNOSIS — E119 Type 2 diabetes mellitus without complications: Secondary | ICD-10-CM | POA: Diagnosis not present

## 2016-08-19 DIAGNOSIS — Z7982 Long term (current) use of aspirin: Secondary | ICD-10-CM | POA: Insufficient documentation

## 2016-08-19 LAB — CBC
HCT: 37.7 % — ABNORMAL LOW (ref 39.0–52.0)
HEMOGLOBIN: 12.5 g/dL — AB (ref 13.0–17.0)
MCH: 23.4 pg — ABNORMAL LOW (ref 26.0–34.0)
MCHC: 33.2 g/dL (ref 30.0–36.0)
MCV: 70.5 fL — AB (ref 78.0–100.0)
Platelets: 285 10*3/uL (ref 150–400)
RBC: 5.35 MIL/uL (ref 4.22–5.81)
RDW: 15.8 % — ABNORMAL HIGH (ref 11.5–15.5)
WBC: 6.4 10*3/uL (ref 4.0–10.5)

## 2016-08-19 LAB — BASIC METABOLIC PANEL
Anion gap: 9 (ref 5–15)
BUN: 15 mg/dL (ref 6–20)
CHLORIDE: 103 mmol/L (ref 101–111)
CO2: 24 mmol/L (ref 22–32)
Calcium: 8.8 mg/dL — ABNORMAL LOW (ref 8.9–10.3)
Creatinine, Ser: 1.07 mg/dL (ref 0.61–1.24)
GFR calc non Af Amer: >60 mL/min (ref >60)
Glucose, Bld: 182 mg/dL — ABNORMAL HIGH (ref 65–99)
POTASSIUM: 3.5 mmol/L (ref 3.5–5.1)
SODIUM: 136 mmol/L (ref 135–145)

## 2016-08-19 LAB — HEPATIC FUNCTION PANEL
ALT: 11 U/L — AB (ref 17–63)
AST: 16 U/L (ref 15–41)
Albumin: 4 g/dL (ref 3.5–5.0)
Alkaline Phosphatase: 65 U/L (ref 38–126)
BILIRUBIN INDIRECT: 0.6 mg/dL (ref 0.3–0.9)
Bilirubin, Direct: 0.1 mg/dL (ref 0.1–0.5)
TOTAL PROTEIN: 7.1 g/dL (ref 6.5–8.1)
Total Bilirubin: 0.7 mg/dL (ref 0.3–1.2)

## 2016-08-19 LAB — LIPASE, BLOOD: LIPASE: 24 U/L (ref 11–51)

## 2016-08-19 LAB — TROPONIN I
Troponin I: <0.03 ng/mL (ref <0.03)
Troponin I: <0.03 ng/mL (ref <0.03)

## 2016-08-19 NOTE — Discharge Instructions (Signed)
Return tomorrow at the given time for an ultrasound of your gallbladder.  Follow-up with cardiology to discuss the possibility of a stress test. The contact information for the [facility] cardiology offices provided in this discharge summary for you to call and make these arrangements.  Return emergency department if your symptoms significantly worsen or change in the meantime.

## 2016-08-19 NOTE — ED Provider Notes (Signed)
[facility] DEPT Provider Note   CSN: [id number] Arrival date & time: 08/19/16  1449     History   Chief Complaint Chief Complaint  Patient presents with  . Chest Pain    HPI Gerald Evans is a 71 y.o. male.  Patient is a 71 year old male with past medical history of hypertension, diabetes, high cholesterol. He presents today for evaluation of chest discomfort. He states this started 2 days ago and is worsening. He describes it as a burning to his upper abdomen/lower chest. He denies any shortness of breath cough, or diaphoresis. He does report having vomiting recently, but no diarrhea. He denies any exertional symptoms. He tells me he had a heart cath approximately 10 years ago that revealed no significant lesions.   The history is provided by the patient.  Chest Pain   This is a new problem. The current episode started 2 days ago. Episode frequency: Intermittently. The problem has been gradually worsening. The pain is present in the substernal region. The pain is moderate. The pain does not radiate. He has tried nothing for the symptoms. The treatment provided no relief.    Past Medical History:  Diagnosis Date  . Colon polyps   . Diabetes mellitus without complication ([place])   . Hypertension     Patient Active Problem List   Diagnosis Date Noted  . DIAB W/HYPEROSMOLARITY TYPE II/UNS TYPE UNCNTRL 04/21/2010  . PURE HYPERCHOLESTEROLEMIA 04/21/2010  . HYPERLIPIDEMIA 04/21/2010  . HYPOPOTASSEMIA 04/21/2010  . HYPERTENSION 04/21/2010  . CORONARY ATHEROSCLEROSIS NATIVE CORONARY ARTERY 04/21/2010  . PERS HX NONCOMPLIANCE W/MED TX PRS HAZARDS HLTH 04/21/2010  . TOBACCO ABUSE, HX OF 04/21/2010    Past Surgical History:  Procedure Laterality Date  . COLONOSCOPY    . COLONOSCOPY  04/21/2012   Procedure: COLONOSCOPY;  Surgeon: Rogene Houston, MD;  Location: [facility];  Service: Endoscopy;  Laterality: N/A;  1200  . HERNIA REPAIR         Home Medications     Prior to Admission medications   Medication Sig Start Date End Date Taking? Authorizing Provider  amLODipine (NORVASC) 5 MG tablet Take 10 mg by mouth daily.     Historical Provider, MD  aspirin EC 81 MG tablet Take 81 mg by mouth daily.    Historical Provider, MD  HYDROCHLOROTHIAZIDE PO Take 25 mg by mouth daily.    Historical Provider, MD  Insulin Glargine (TOUJEO SOLOSTAR ) Inject into the skin. Patient states that it varies depending on his blood sugars. It may be 0-20 untis- 40 units    Historical Provider, MD  lisinopril (PRINIVIL,ZESTRIL) 40 MG tablet Take 40 mg by mouth daily.    Historical Provider, MD  metFORMIN (GLUCOPHAGE) 500 MG tablet Take 500 mg by mouth 2 (two) times daily with a meal.     Historical Provider, MD  metoprolol (LOPRESSOR) 100 MG tablet Take 100 mg by mouth 2 (two) times daily.     Historical Provider, MD  simvastatin (ZOCOR) 20 MG tablet Take 20 mg by mouth daily.    Historical Provider, MD    Family History Family History  Problem Relation Age of Onset  . Hypertension Father   . Diabetes Sister   . Hypertension Sister   . Healthy Son   . Healthy Daughter   . Colon cancer Neg Hx     Social History Social History  Substance Use Topics  . Smoking status: Former Smoker    Packs/day: 0.50    Years: 20.00  Types: Cigarettes  . Smokeless tobacco: Never Used  . Alcohol use 2.0 oz/week    4 Standard drinks or equivalent per week     Comment: Patient states that he drinks on weekends, has ben dry for 2 weeks per patient     Allergies   Patient has no known allergies.   Review of Systems Review of Systems  Cardiovascular: Positive for chest pain.  All other systems reviewed and are negative.    Physical Exam Updated Vital Signs BP 136/79   Pulse 85   Temp 99.3 F (37.4 C) (Oral)   Resp 16   Ht 5' 8"  (1.727 m)   Wt 230 lb (104.3 kg)   SpO2 96%   BMI 34.97 kg/m   Physical Exam  Constitutional: He is oriented to person, place,  and time. He appears well-developed and well-nourished. No distress.  HENT:  Head: Normocephalic and atraumatic.  Mouth/Throat: Oropharynx is clear and moist.  Neck: Normal range of motion. Neck supple.  Cardiovascular: Normal rate and regular rhythm.  Exam reveals no friction rub.   No murmur heard. Pulmonary/Chest: Effort normal and breath sounds normal. No respiratory distress. He has no wheezes. He has no rales.  Abdominal: Soft. Bowel sounds are normal. He exhibits no distension. There is no tenderness.  Musculoskeletal: Normal range of motion. He exhibits no edema.  Neurological: He is alert and oriented to person, place, and time. Coordination normal.  Skin: Skin is warm and dry. He is not diaphoretic.  Nursing note and vitals reviewed.    ED Treatments / Results  Labs (all labs ordered are listed, but only abnormal results are displayed) Labs Reviewed  BASIC METABOLIC PANEL - Abnormal; Notable for the following:       Result Value   Glucose, Bld 182 (*)    Calcium 8.8 (*)    All other components within normal limits  CBC - Abnormal; Notable for the following:    Hemoglobin 12.5 (*)    HCT 37.7 (*)    MCV 70.5 (*)    MCH 23.4 (*)    RDW 15.8 (*)    All other components within normal limits  TROPONIN I    EKG  EKG Interpretation  Date/Time:  Wednesday August 19 2016 14:55:54 EST Ventricular Rate:  84 PR Interval:  180 QRS Duration: 86 QT Interval:  362 QTC Calculation: 427 R Axis:   12 Text Interpretation:  Normal sinus rhythm Anterior infarct , age undetermined Abnormal ECG Confirmed by Ridhi Hoffert  MD, Miosotis Wetsel ([id number]) on 08/19/2016 3:02:59 PM       Radiology Dg Chest 2 View  Result Date: 08/19/2016 CLINICAL DATA:  Chest pain. EXAM: CHEST  2 VIEW COMPARISON:  Prior report 05/02/2011 . FINDINGS: Mediastinum and hilar structures normal. Cardiomegaly with normal pulmonary vascularity. Low lung volumes with mild basilar atelectasis. Mild right middle lobe infiltrate  cannot be excluded . Patch that No pleural effusion or pneumothorax. Prior right paratracheal soft tissues most likely secondary to degenerative change. Similar findings noted on prior exams. Degenerative changes thoracic spine. IMPRESSION: 1. Low lung volumes with mild basilar atelectasis. Mild right middle lobe infiltrate cannot be excluded. 2.  Stable cardiomegaly.  No pulmonary venous congestion Electronically Signed   By: Marcello Moores  Register   On: 08/19/2016 15:31    Procedures Procedures (including critical care time)  Medications Ordered in ED Medications - No data to display   Initial Impression / Assessment and Plan / ED Course  I have reviewed the triage  vital signs and the nursing notes.  Pertinent labs & imaging results that were available during my care of the patient were reviewed by me and considered in my medical decision making (see chart for details).  Workup reveals no evidence for a cardiac etiology. EKG is unchanged and troponin is negative 2. He appears to be tender in the epigastrium and I suspect possibly could be related to a gallbladder. He will be discharged with pain medication and outpatient ultrasound.  He will also be given the contact information with cardiology for him to follow-up to discuss a possible stress test even though his symptoms are atypical for this.  Final Clinical Impressions(s) / ED Diagnoses   Final diagnoses:  None    New Prescriptions New Prescriptions   No medications on file     Veryl Speak, MD 08/19/16 2021

## 2016-08-19 NOTE — ED Triage Notes (Signed)
Epigastric pain since last night.  Vomited times one.

## 2016-08-20 ENCOUNTER — Ambulatory Visit (HOSPITAL_COMMUNITY)
Admit: 2016-08-20 | Discharge: 2016-08-20 | Disposition: A | Discharge status: Home / Self Care | Payer: Medicare HMO | Source: Ambulatory Visit | Attending: Emergency Medicine | Admitting: Emergency Medicine

## 2016-08-20 DIAGNOSIS — R0789 Other chest pain: Secondary | ICD-10-CM | POA: Diagnosis not present

## 2016-08-20 DIAGNOSIS — R1013 Epigastric pain: Secondary | ICD-10-CM

## 2016-08-20 DIAGNOSIS — K7689 Other specified diseases of liver: Secondary | ICD-10-CM | POA: Insufficient documentation

## 2016-08-20 NOTE — ED Provider Notes (Signed)
Pt returned for outpatient [place] with negative findings, normal appearing gallbladder study.  Discussed results.  Advised f/u with pcp for further eval/management if sx persist.    Evalee Jefferson, PA-C 08/20/16 [place], MD 08/21/16 [phone]

## 2016-08-27 DIAGNOSIS — E785 Hyperlipidemia, unspecified: Secondary | ICD-10-CM | POA: Diagnosis not present

## 2016-08-27 DIAGNOSIS — E1165 Type 2 diabetes mellitus with hyperglycemia: Secondary | ICD-10-CM | POA: Diagnosis not present

## 2016-08-27 DIAGNOSIS — Z6835 Body mass index (BMI) 35.0-35.9, adult: Secondary | ICD-10-CM | POA: Diagnosis not present

## 2016-08-27 DIAGNOSIS — I10 Essential (primary) hypertension: Secondary | ICD-10-CM | POA: Diagnosis not present

## 2016-09-14 DIAGNOSIS — E1165 Type 2 diabetes mellitus with hyperglycemia: Secondary | ICD-10-CM | POA: Diagnosis not present

## 2016-09-14 DIAGNOSIS — I10 Essential (primary) hypertension: Secondary | ICD-10-CM | POA: Diagnosis not present

## 2016-09-20 DIAGNOSIS — R69 Illness, unspecified: Secondary | ICD-10-CM | POA: Diagnosis not present

## 2016-11-10 ENCOUNTER — Ambulatory Visit (INDEPENDENT_AMBULATORY_CARE_PROVIDER_SITE_OTHER): Payer: Self-pay | Admitting: Internal Medicine

## 2016-11-10 ENCOUNTER — Ambulatory Visit (INDEPENDENT_AMBULATORY_CARE_PROVIDER_SITE_OTHER): Payer: Medicare HMO | Admitting: Internal Medicine

## 2016-11-10 ENCOUNTER — Encounter (INDEPENDENT_AMBULATORY_CARE_PROVIDER_SITE_OTHER): Payer: Self-pay | Admitting: Internal Medicine

## 2016-11-10 VITALS — BP 180/60 | HR 64 | Temp 97.8°F | Ht 68.0 in | Wt 227.5 lb

## 2016-11-10 DIAGNOSIS — K760 Fatty (change of) liver, not elsewhere classified: Secondary | ICD-10-CM

## 2016-11-10 LAB — HEPATITIS PANEL, ACUTE
HCV Ab: NEGATIVE
HEP B C IGM: NONREACTIVE
Hep A IgM: NONREACTIVE
Hepatitis B Surface Ag: NEGATIVE

## 2016-11-10 LAB — HEPATIC FUNCTION PANEL
ALT: 14 U/L (ref 9–46)
AST: 19 U/L (ref 10–35)
Albumin: 4.2 g/dL (ref 3.6–5.1)
Alkaline Phosphatase: 72 U/L (ref 40–115)
BILIRUBIN DIRECT: 0.1 mg/dL (ref <=0.2)
BILIRUBIN INDIRECT: 0.2 mg/dL (ref 0.2–1.2)
Total Bilirubin: 0.3 mg/dL (ref 0.2–1.2)
Total Protein: 7 g/dL (ref 6.1–8.1)

## 2016-11-10 NOTE — Progress Notes (Signed)
   Subjective:    Patient ID: Gerald Evans, male    DOB: 1946/06/04, 71 y.o.   MRN: [id number] wt in November 238.   HPI Here today for f/u. He was last seen in November of 2017.  Hx of fatty liver.  Auto immune process ruled out.  He is trying to diet. He has lost from 238 to 227.5. He is walking x once a week. Has been repairing houses. Staying active. Appetite is good. Weight loss intentional. He says he has more energy.   Hepatic Function Latest Ref Rng & Units 08/19/2016 05/13/2016  Total Protein 6.5 - 8.1 g/dL 7.1 7.1  Albumin 3.5 - 5.0 g/dL 4.0 4.4  AST 15 - 41 U/L 16 16  ALT 17 - 63 U/L 11(L) 11  Alk Phosphatase 38 - 126 U/L 65 66  Total Bilirubin 0.3 - 1.2 mg/dL 0.7 0.3  Bilirubin, Direct 0.1 - 0.5 mg/dL 0.1 0.1   CBC    Component Value Date/Time   WBC 6.4 08/19/2016 1508   RBC 5.35 08/19/2016 1508   HGB 12.5 (L) 08/19/2016 1508   HCT 37.7 (L) 08/19/2016 1508   PLT 285 08/19/2016 1508   MCV 70.5 (L) 08/19/2016 1508   MCH 23.4 (L) 08/19/2016 1508   MCHC 33.2 08/19/2016 1508   RDW 15.8 (H) 08/19/2016 1508   LYMPHSABS 3,157 05/13/2016 1557   MONOABS 616 05/13/2016 1557   EOSABS 77 05/13/2016 1557   BASOSABS 0 05/13/2016 1557    05/20/2016 Ferritin 187,   SMA less than 20 sedrate 7, vitamin B12 302,, Folate 16.9, Iron 68, Ceruloplasmin 33, Alpha 1 Antitrypsin 117.    08/20/2016 [place] RUQ:   Increased hepatic echotexture likely reflecting fatty infiltrative change. Partially septated cyst measuring 1.9 cm in greatest dimension in the left hepatic lobe appears stable.  No gallstones or sonographic evidence of acute cholecystitis. If there are clinical concerns of chronic cholecystitis, a nuclear medicine hepatobiliary scan with gallbladder ejection fraction determination may be useful.   04/2016 [place] Elasat: F3-F4 04/17/2016 US Abdomen:  IMPRESSION: 1. Liver is echogenic consistent fatty infiltration and/or hepatocellular disease.  2. 1.5 cm thinly septated  cyst left hepatic lobe, this is most likely benign.  3. No acute abnormality identified. No ascites noted. Review of Systems     Objective:   Physical Exam Blood pressure (!) 180/60, pulse 64, temperature 97.8 F (36.6 C), height _0  (1.727 m), weight 227 lb 8 oz (103.2 kg). Alert and oriented. Skin warm and dry. Oral mucosa is moist.   . Sclera anicteric, conjunctivae is pink. Thyroid not enlarged. No cervical lymphadenopathy. Lungs clear. Heart regular rate and rhythm.  Abdomen is soft. Bowel sounds are positive. No hepatomegaly. No abdominal masses felt. No tenderness.  No edema to lower extremities.          Assessment & Plan:  Fatty liver with normal transaminases. CBC normal with normal platelets.  Hepatic function, Acute Hepatitis panel  Continue to diet and exercise.  OV in 6 months.

## 2016-11-10 NOTE — Patient Instructions (Addendum)
Continue to diet and exercise. OV in 6 months. Nonalcoholic Fatty Liver Disease Diet Nonalcoholic fatty liver disease is a condition that causes fat to accumulate in and around the liver. The disease makes it harder for the liver to work the way that it should. Following a healthy diet can help to keep nonalcoholic fatty liver disease under control. It can also help to prevent or improve conditions that are associated with the disease, such as heart disease, diabetes, high blood pressure, and abnormal cholesterol levels. Along with regular exercise, this diet:  Promotes weight loss.  Helps to control blood sugar levels.  Helps to improve the way that the body uses insulin. What do I need to know about this diet?  Use the glycemic index (GI) to plan your meals. The index tells you how quickly a food will raise your blood sugar. Choose low-GI foods. These foods take a longer time to raise blood sugar.  Keep track of how many calories you take in. Eating the right amount of calories will help you to achieve a healthy weight.  You may want to follow a Mediterranean diet. This diet includes a lot of vegetables, lean meats or fish, whole grains, fruits, and healthy oils and fats. What foods can I eat? Grains  Whole grains, such as whole-wheat or whole-grain breads, crackers, tortillas, cereals, and pasta. Stone-ground whole wheat. Pumpernickel bread. Unsweetened oatmeal. Bulgur. Barley. Quinoa. Brown or wild rice. Corn or whole-wheat flour tortillas. Vegetables  Lettuce. Spinach. Peas. Beets. Cauliflower. Cabbage. Broccoli. Carrots. Tomatoes. Squash. Eggplant. Herbs. Peppers. Onions. Cucumbers. Brussels sprouts. Yams and sweet potatoes. Beans. Lentils. Fruits  Bananas. Apples. Oranges. Grapes. Papaya. Mango. Pomegranate. Kiwi. Grapefruit. Cherries. Meats and Other Protein Sources  Seafood and shellfish. Lean meats. Poultry. Tofu. Dairy  Low-fat or fat-free dairy products, such as yogurt, cottage  cheese, and cheese. Beverages  Water. Sugar-free drinks. Tea. Coffee. Low-fat or skim milk. Milk alternatives, such as soy or almond milk. Real fruit juice. Condiments  Mustard. Relish. Low-fat, low-sugar ketchup and barbecue sauce. Low-fat or fat-free mayonnaise. Sweets and Desserts  Sugar-free sweets. Fats and Oils  Avocado. Canola or olive oil. Nuts and nut butters. Seeds. The items listed above may not be a complete list of recommended foods or beverages. Contact your dietitian for more options.  What foods are not recommended? Palm oil and coconut oil. Processed foods. Fried foods. Sweetened drinks, such as sweet tea, milkshakes, snow cones, iced sweet drinks, and sodas. Alcohol. Sweets. Foods that contain a lot of salt or sodium. The items listed above may not be a complete list of foods and beverages to avoid. Contact your dietitian for more information.  This information is not intended to replace advice given to you by your health care provider. Make sure you discuss any questions you have with your health care provider. Document Released: 10/30/2014 Document Revised: 11/21/2015 Document Reviewed: 07/10/2014 Elsevier Interactive Patient Education  2017 [employer].

## 2016-11-27 DIAGNOSIS — E785 Hyperlipidemia, unspecified: Secondary | ICD-10-CM | POA: Diagnosis not present

## 2016-11-27 DIAGNOSIS — I10 Essential (primary) hypertension: Secondary | ICD-10-CM | POA: Diagnosis not present

## 2016-11-27 DIAGNOSIS — E1165 Type 2 diabetes mellitus with hyperglycemia: Secondary | ICD-10-CM | POA: Diagnosis not present

## 2016-11-27 DIAGNOSIS — E119 Type 2 diabetes mellitus without complications: Secondary | ICD-10-CM | POA: Diagnosis not present

## 2016-11-27 DIAGNOSIS — Z0000 Encounter for general adult medical examination without abnormal findings: Secondary | ICD-10-CM | POA: Diagnosis not present

## 2016-12-06 DIAGNOSIS — R69 Illness, unspecified: Secondary | ICD-10-CM | POA: Diagnosis not present

## 2017-01-13 IMAGING — US US ABDOMEN COMPLETE
1 series · 14 of 25 positions shown · non-contrast
Comparison: None.

CLINICAL DATA: Abdominal swelling.  Ascites.

EXAM:
ABDOMEN ULTRASOUND COMPLETE

[Series 1: us abdomen complete · 0.21mm/px · 14 of 107 slices shown]
[im 1/107]
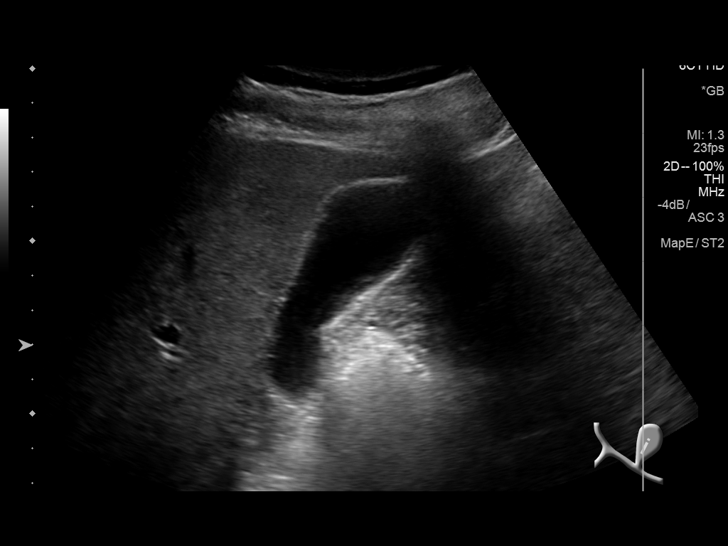
[im 9/107]
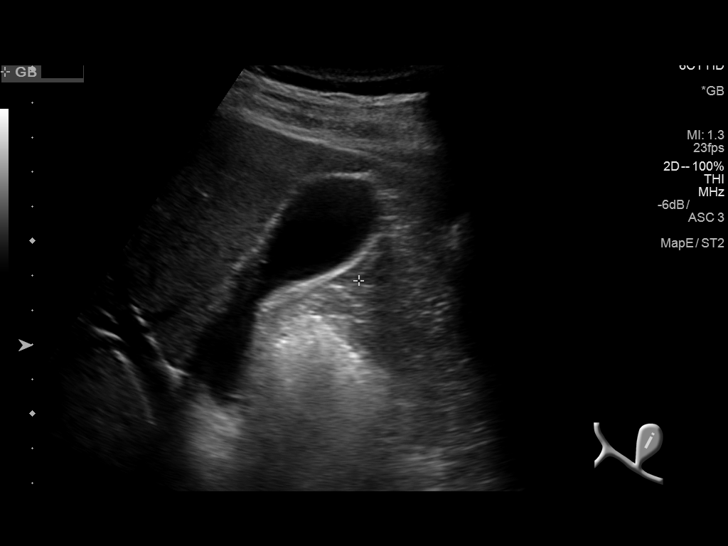
[im 18/107]
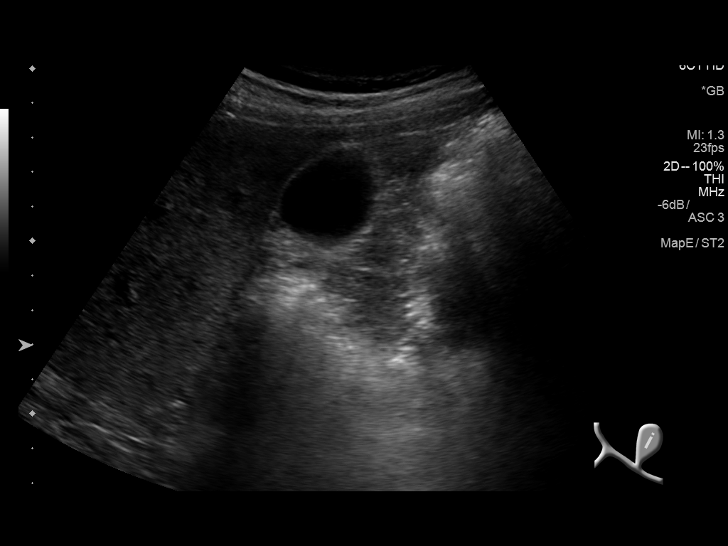
[im 27/107]
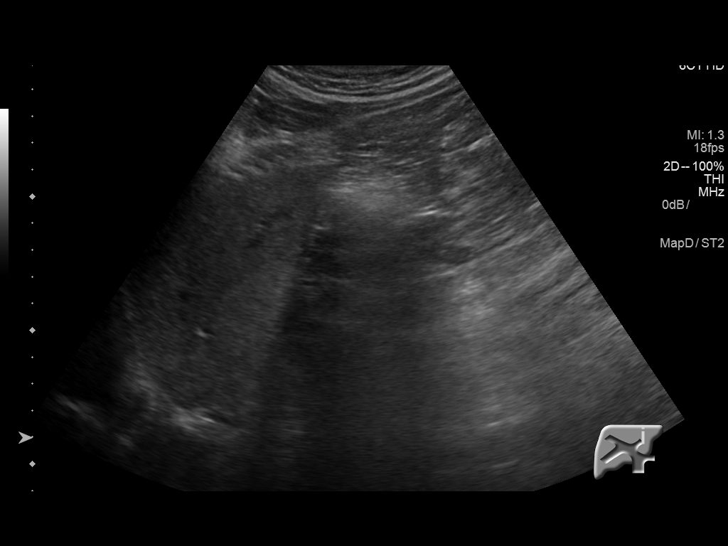
[im 36/107]
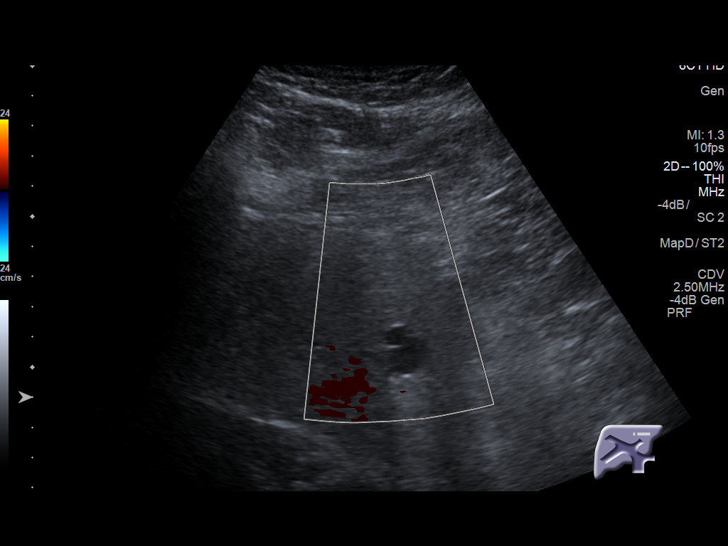
[im 40/107]
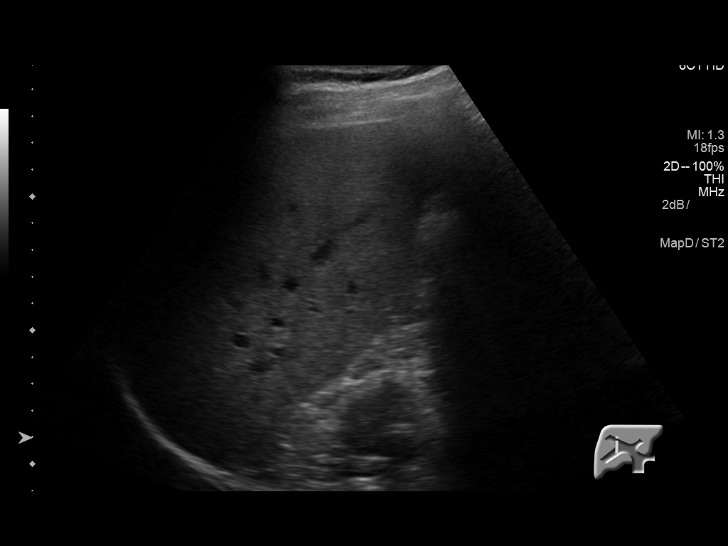
[im 49/107]
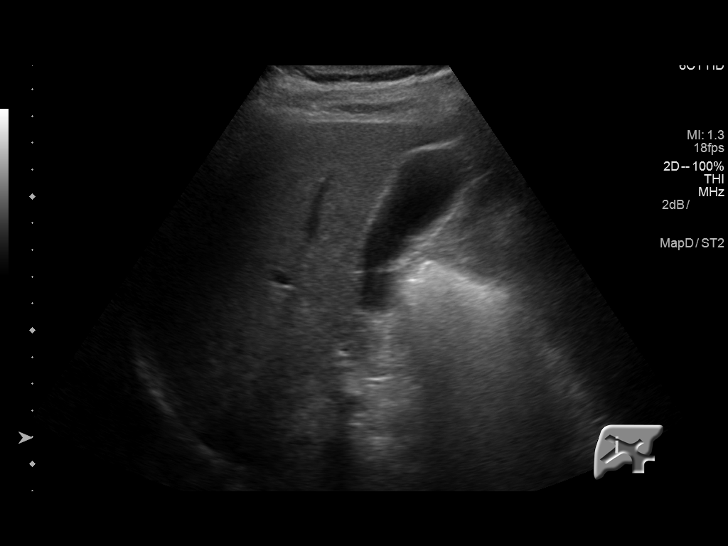
[im 58/107]
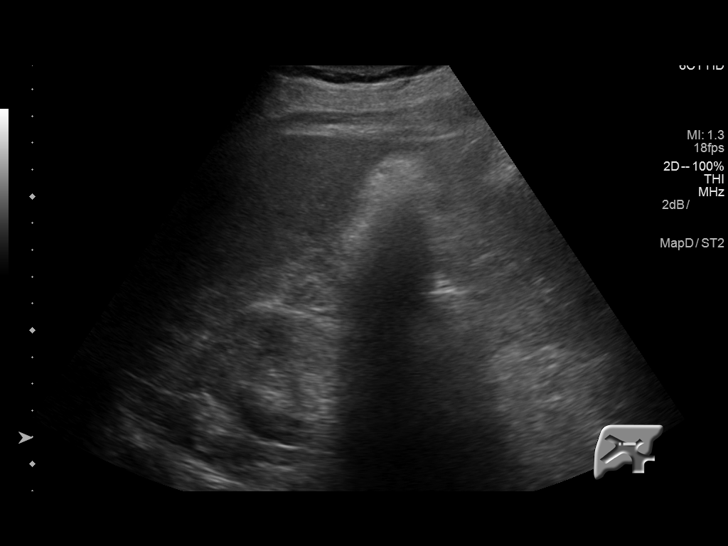
[im 67/107]
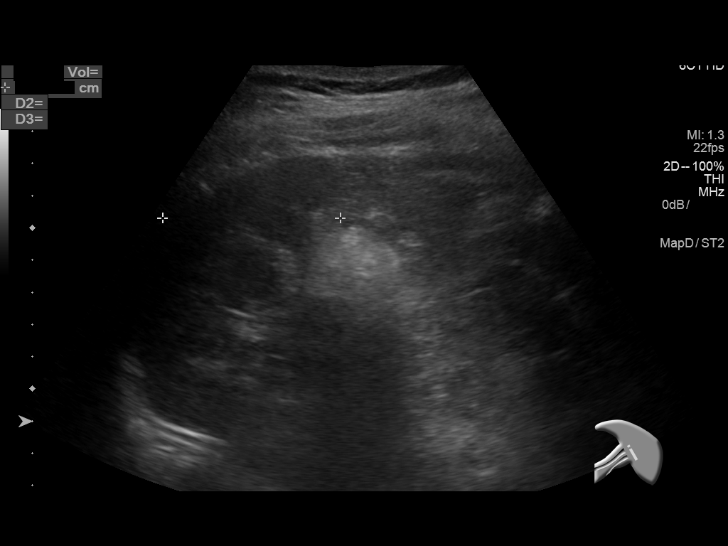
[im 71/107]
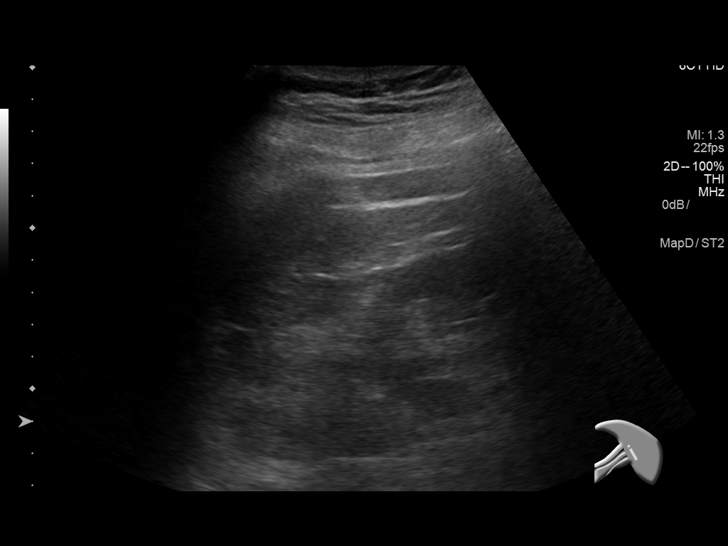
[im 80/107]
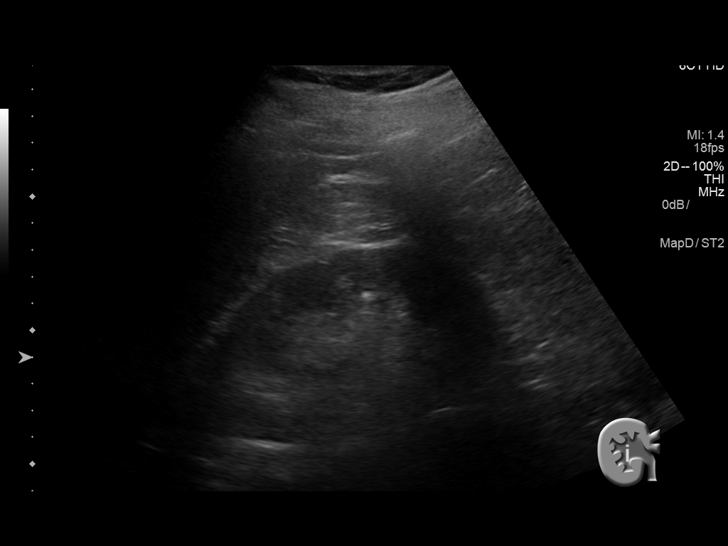
[im 89/107]
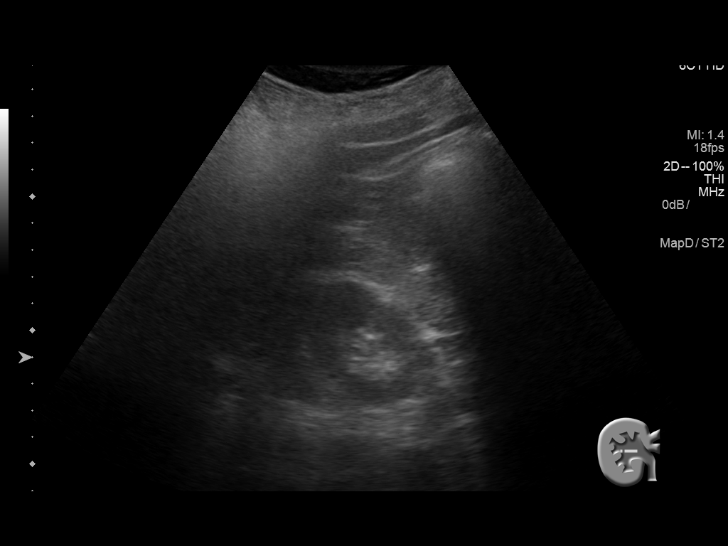
[im 98/107]
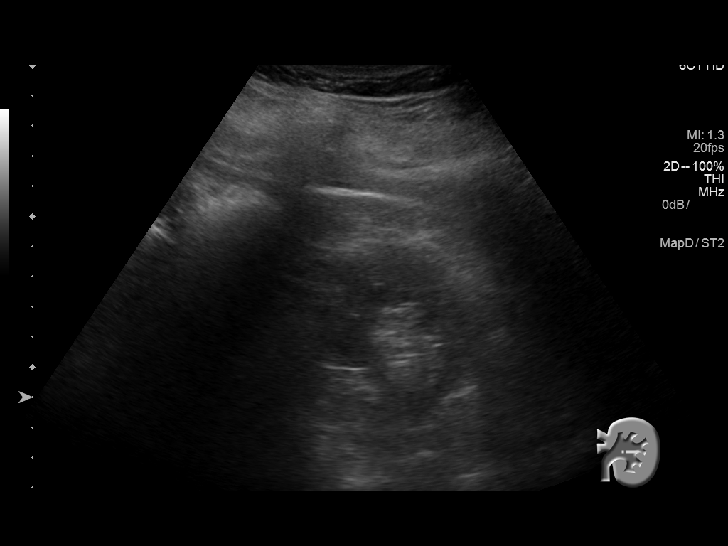
[im 107/107]
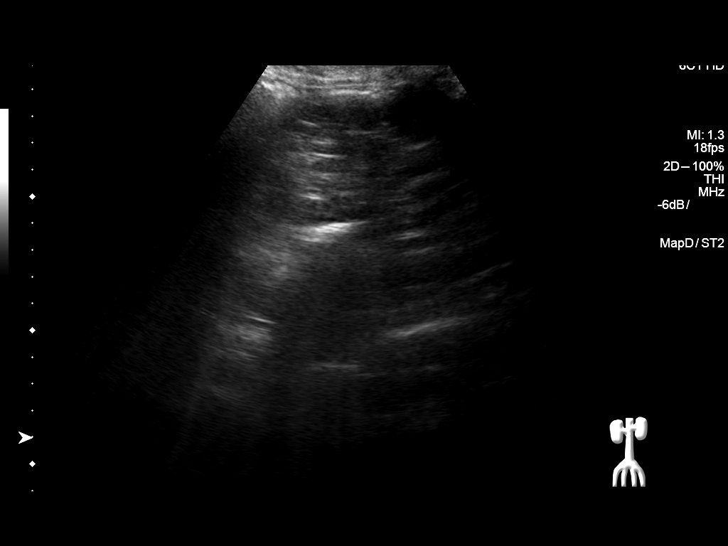

[14 of 25 positions shown; findings below may reference images not displayed]

FINDINGS: Gallbladder: No gallstones or wall thickening visualized. No
sonographic Murphy sign noted by sonographer.

Common bile duct: Diameter: 4.1 mm

Liver: There is echogenic consistent fatty infiltration and/or
hepatocellular disease. 1.5 cm cyst with thin septation left hepatic
lobe. This is most likely benign.

IVC: No abnormality visualized.

Pancreas: Visualized portion unremarkable.

Spleen: Size and appearance within normal limits.

Right Kidney: Length: 11.9 cm. Echogenicity within normal limits. No
mass or hydronephrosis visualized.

Left Kidney: Length: 11.8 cm. Echogenicity within normal limits. No
mass or hydronephrosis visualized.

Abdominal aorta: No aneurysm visualized.

Other findings: No ascites.
IMPRESSION: 1. Liver is echogenic consistent fatty infiltration and/or
hepatocellular disease.

2. 1.5 cm thinly septated cyst left hepatic lobe, this is most
likely benign.

3.  No acute abnormality identified.  No ascites noted.

## 2017-02-03 DIAGNOSIS — R69 Illness, unspecified: Secondary | ICD-10-CM | POA: Diagnosis not present

## 2017-02-10 DIAGNOSIS — R69 Illness, unspecified: Secondary | ICD-10-CM | POA: Diagnosis not present

## 2017-02-13 IMAGING — US US ABDOMEN LIMITED
1 series · 13 of 25 positions shown · non-contrast
Comparison: None.

CLINICAL DATA: Fatty liver

EXAM:
US ABDOMEN LIMITED - RIGHT UPPER QUADRANT
ULTRASOUND HEPATIC ELASTOGRAPHY
TECHNIQUE: Limited right upper quadrant abdominal ultrasound was performed. In
addition, ultrasound elastography evaluation of the liver was
performed. A region of interest was placed in the right lobe of the
liver. Following application of a compressive sonographic pulse,
shear waves were detected in the adjacent hepatic tissue and the
shear wave velocity was calculated. Multiple assessments were
performed at the selected site. Median shear wave velocity is
correlated to a Metavir fibrosis score.

[Series 1: us abdomen limited · 0.19mm/px · 13 of 65 slices shown]
[im 1/65]
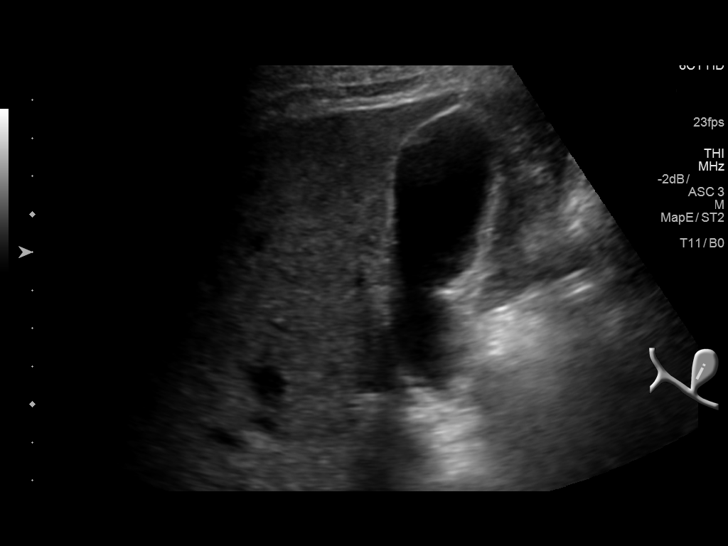
[im 6/65]
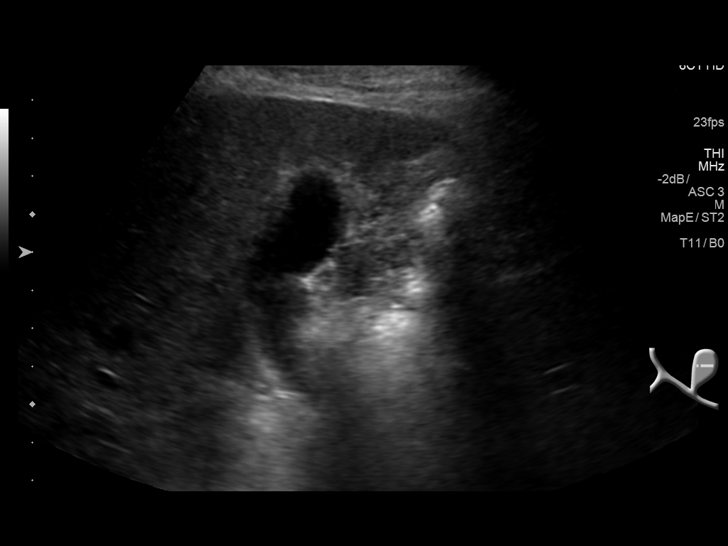
[im 11/65]
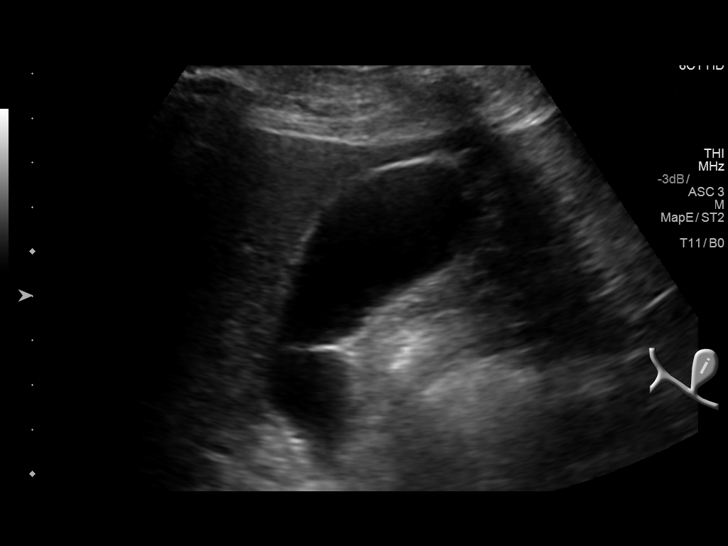
[im 17/65]
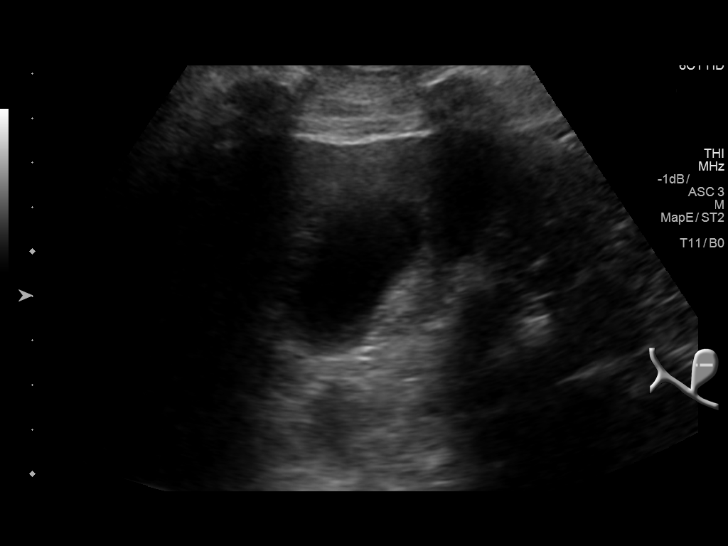
[im 22/65]
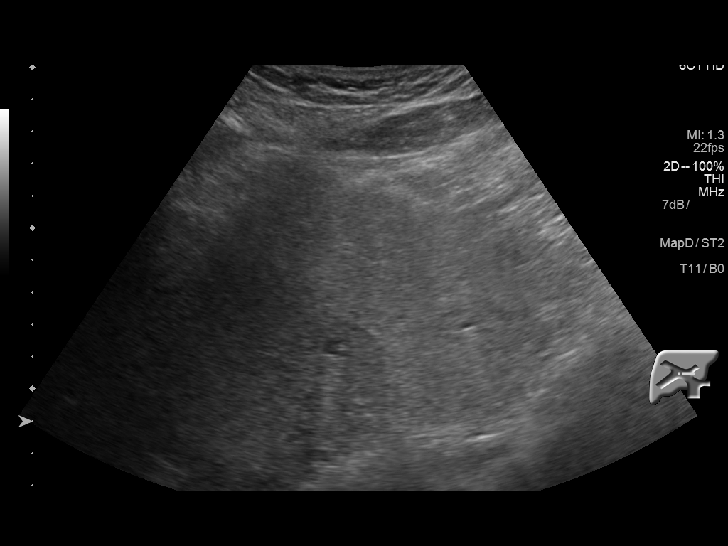
[im 27/65]
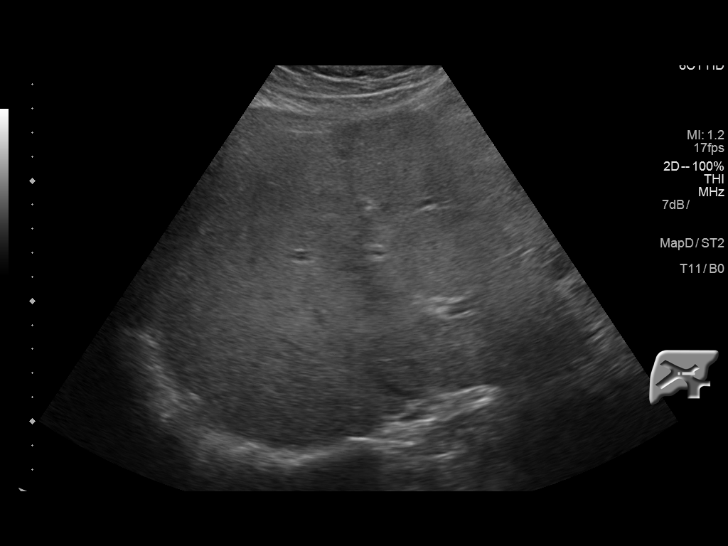
[im 33/65]
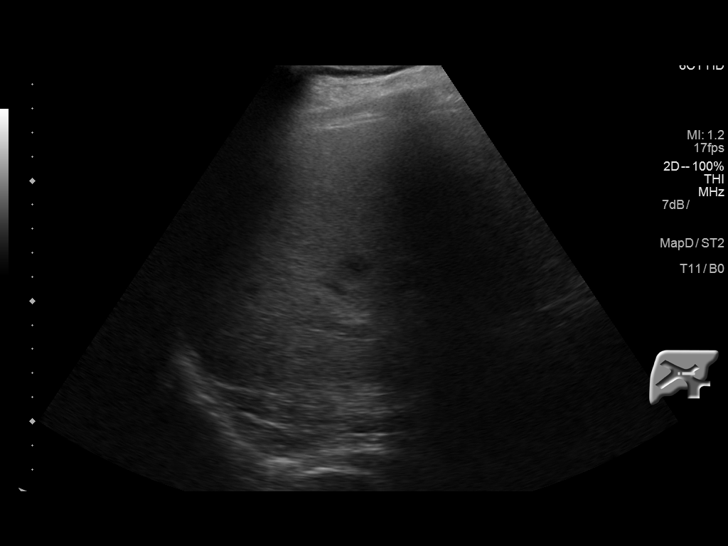
[im 38/65]
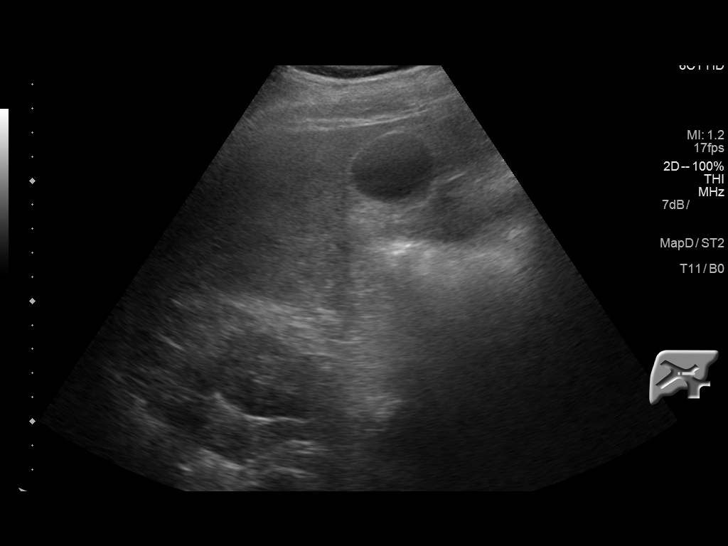
[im 43/65]
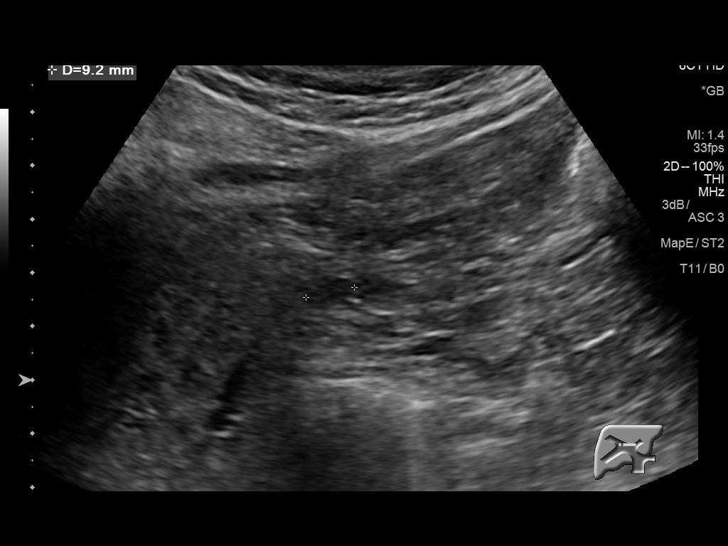
[im 49/65]
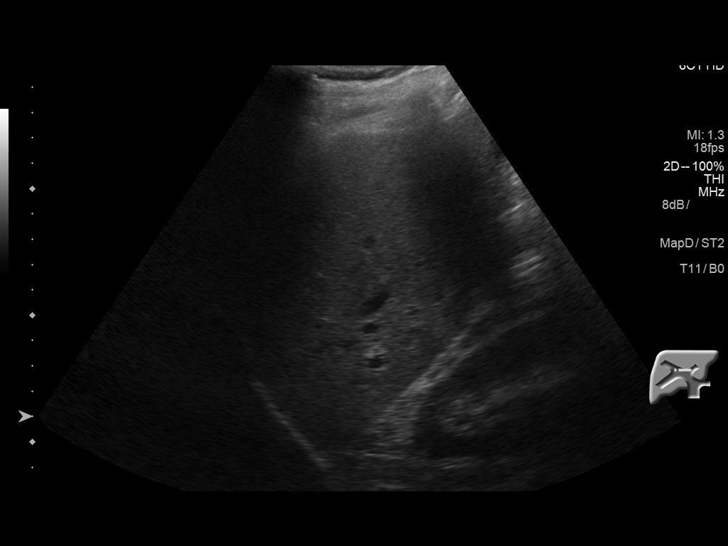
[im 54/65]
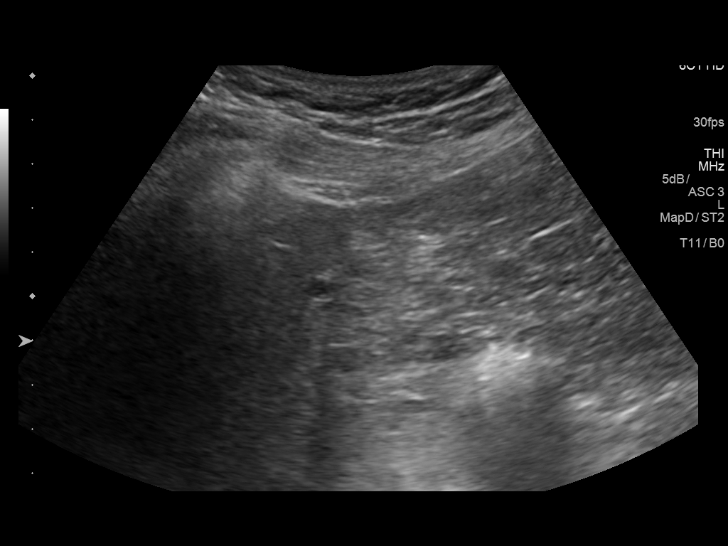
[im 59/65]
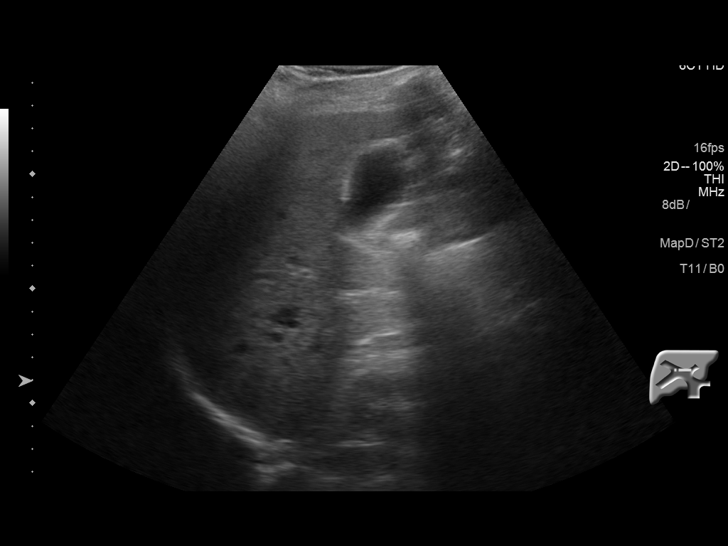
[im 65/65]
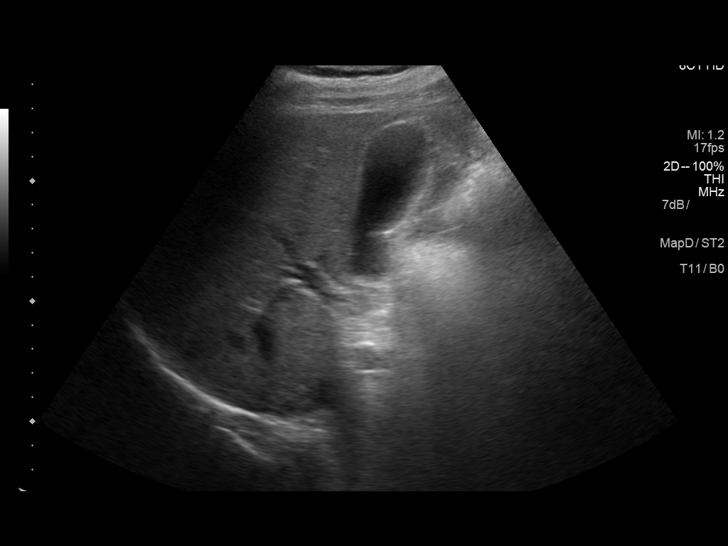

[13 of 25 positions shown; findings below may reference images not displayed]

FINDINGS: ULTRASOUND ABDOMEN LIMITED RIGHT UPPER QUADRANT

Gallbladder:

No gallstones or wall thickening visualized. No sonographic Murphy
sign noted.

Common bile duct:

Diameter: Normal caliber, 3 mm

Liver:

Increased echotexture compatible fatty infiltration. 9 mm hypoechoic
area within the left hepatic lobe, likely small cyst. No biliary
duct dilatation.

ULTRASOUND HEPATIC ELASTOGRAPHY

Device: Siemens Helix VTQ

Patient position: Left lateral decubitus

Transducer 4 v 1

Number of measurements: 10

Hepatic segment:  8

Median velocity:   3.67  m/sec

IQR:

IQR/Median velocity ratio:

Corresponding Metavir fibrosis score:  F 3+ F 4

Risk of fibrosis: High

Limitations of exam: None

Pertinent findings noted on other imaging exams:  None

Please note that abnormal shear wave velocities may also be
identified in clinical settings other than with hepatic fibrosis,
such as: acute hepatitis, elevated right heart and central venous
pressures including use of beta blockers, Yuu disease
([place]), infiltrative processes such as
mastocytosis/amyloidosis/infiltrative tumor, extrahepatic
cholestasis, in the post-prandial state, and liver transplantation.
Correlation with patient history, laboratory data, and clinical
condition recommended.
IMPRESSION: ULTRASOUND ABDOMEN:
Diffuse fatty infiltration of the liver.

ULTRASOUND HEPATIC ELASTOGRAHY:

Median hepatic shear wave velocity is calculated at 3.67 m/sec.

Corresponding Metavir fibrosis score is  F 3+ F 4.

Risk of fibrosis is high.

Follow-up: Advised

## 2017-03-24 DIAGNOSIS — Z1389 Encounter for screening for other disorder: Secondary | ICD-10-CM | POA: Diagnosis not present

## 2017-03-24 DIAGNOSIS — I10 Essential (primary) hypertension: Secondary | ICD-10-CM | POA: Diagnosis not present

## 2017-03-24 DIAGNOSIS — E785 Hyperlipidemia, unspecified: Secondary | ICD-10-CM | POA: Diagnosis not present

## 2017-03-24 DIAGNOSIS — Z23 Encounter for immunization: Secondary | ICD-10-CM | POA: Diagnosis not present

## 2017-03-24 DIAGNOSIS — E119 Type 2 diabetes mellitus without complications: Secondary | ICD-10-CM | POA: Diagnosis not present

## 2017-03-24 DIAGNOSIS — Z6835 Body mass index (BMI) 35.0-35.9, adult: Secondary | ICD-10-CM | POA: Diagnosis not present

## 2017-03-24 DIAGNOSIS — E1165 Type 2 diabetes mellitus with hyperglycemia: Secondary | ICD-10-CM | POA: Diagnosis not present

## 2017-03-24 DIAGNOSIS — Z0000 Encounter for general adult medical examination without abnormal findings: Secondary | ICD-10-CM | POA: Diagnosis not present

## 2017-04-15 DIAGNOSIS — R69 Illness, unspecified: Secondary | ICD-10-CM | POA: Diagnosis not present

## 2017-04-27 ENCOUNTER — Encounter (INDEPENDENT_AMBULATORY_CARE_PROVIDER_SITE_OTHER): Payer: Self-pay | Admitting: *Deleted

## 2017-05-13 ENCOUNTER — Encounter (INDEPENDENT_AMBULATORY_CARE_PROVIDER_SITE_OTHER): Payer: Self-pay

## 2017-05-13 ENCOUNTER — Other Ambulatory Visit (INDEPENDENT_AMBULATORY_CARE_PROVIDER_SITE_OTHER): Payer: Self-pay | Admitting: *Deleted

## 2017-05-13 ENCOUNTER — Ambulatory Visit (INDEPENDENT_AMBULATORY_CARE_PROVIDER_SITE_OTHER): Payer: Medicare HMO | Admitting: Internal Medicine

## 2017-05-13 ENCOUNTER — Encounter (INDEPENDENT_AMBULATORY_CARE_PROVIDER_SITE_OTHER): Payer: Self-pay | Admitting: Internal Medicine

## 2017-05-13 VITALS — BP 142/70 | HR 60 | Temp 97.6°F | Ht 68.0 in | Wt 240.3 lb

## 2017-05-13 DIAGNOSIS — Z86010 Personal history of colonic polyps: Secondary | ICD-10-CM | POA: Insufficient documentation

## 2017-05-13 DIAGNOSIS — K760 Fatty (change of) liver, not elsewhere classified: Secondary | ICD-10-CM

## 2017-05-13 NOTE — Progress Notes (Signed)
   Subjective:    Patient ID: Gerald Evans, male    DOB: 03-Jan-1946, 71 y.o.   MRN: [id number] Wt in May was 227.  HPI Here today for f/u. Las seen in May of 2018 for fatty liver with normal liver enzymes.  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 Partially septated cyst measuring 1.9 cm in greatest dimension in the left hepatic lobe appears stable. Normal platelets.  Auto immune process ruled out.  05/20/2016 Ferritin 187,   SMA less than 20 sedrate 7, vitamin B12 302,, Folate 16.9, Iron 68, Ceruloplasmin 33, Alpha 1 Antitrypsin 117.  Acute hepatitis panel negative.  Occasionally has a drink or two on the weekends.  He is doing good. Appetite is good.  She has gained 13 pounds since his in May. BMs ar normal. No melena or BRRB.   Diabetic x 10 yrs.  CBC    Component Value Date/Time   WBC 6.4 08/19/2016 1508   RBC 5.35 08/19/2016 1508   HGB 12.5 (L) 08/19/2016 1508   HCT 37.7 (L) 08/19/2016 1508   PLT 285 08/19/2016 1508   MCV 70.5 (L) 08/19/2016 1508   MCH 23.4 (L) 08/19/2016 1508   MCHC 33.2 08/19/2016 1508   RDW 15.8 (H) 08/19/2016 1508   LYMPHSABS 3,157 05/13/2016 1557   MONOABS 616 05/13/2016 1557   EOSABS 77 05/13/2016 1557   BASOSABS 0 05/13/2016 1557      Hepatic Function Latest Ref Rng & Units 11/10/2016 08/19/2016 05/13/2016  Total Protein 6.1 - 8.1 g/dL 7.0 7.1 7.1  Albumin 3.6 - 5.1 g/dL 4.2 4.0 4.4  AST 10 - 35 U/L 19 16 16   ALT 9 - 46 U/L 14 11(L) 11  Alk Phosphatase 40 - 115 U/L 72 65 66  Total Bilirubin 0.2 - 1.2 mg/dL 0.3 0.7 0.3  Bilirubin, Direct <=0.2 mg/dL 0.1 0.1 0.1      Review of Systems     Objective:   Physical Exam Blood pressure (!) 142/70, pulse 60, temperature 97.6 F (36.4 C), height 5' 8"  (1.727 m), weight 240 lb 4.8 oz (109 kg). Alert and oriented. Skin warm and dry. Oral mucosa is moist.   .  Sclera anicteric, conjunctivae is pink. Thyroid not enlarged. No cervical lymphadenopathy. Lungs clear. Heart regular rate and rhythm.  Abdomen is soft. Bowel sounds are positive. No hepatomegaly. No abdominal masses felt. No tenderness.  No edema to lower extremities.           Assessment & Plan:  Fatty liver with; normal liver enzymes. Platelets are normal. OV in 1 year. Diet and exercise.

## 2017-05-13 NOTE — Patient Instructions (Signed)
Labs today. OV in 6 months.

## 2017-05-14 DIAGNOSIS — K760 Fatty (change of) liver, not elsewhere classified: Secondary | ICD-10-CM | POA: Diagnosis not present

## 2017-05-14 LAB — CBC WITH DIFFERENTIAL/PLATELET
Basophils Absolute: 43 cells/uL (ref 0–200)
Basophils Relative: 0.7 %
EOS PCT: 2.8 %
Eosinophils Absolute: 171 cells/uL (ref 15–500)
HEMATOCRIT: 39.6 % (ref 38.5–50.0)
Hemoglobin: 12.5 g/dL — ABNORMAL LOW (ref 13.2–17.1)
LYMPHS ABS: 2532 {cells}/uL (ref 850–3900)
MCH: 23.3 pg — ABNORMAL LOW (ref 27.0–33.0)
MCHC: 31.6 g/dL — ABNORMAL LOW (ref 32.0–36.0)
MCV: 73.7 fL — ABNORMAL LOW (ref 80.0–100.0)
MPV: 10.7 fL (ref 7.5–12.5)
Monocytes Relative: 7.9 %
NEUTROS ABS: 2873 {cells}/uL (ref 1500–7800)
NEUTROS PCT: 47.1 %
Platelets: 237 10*3/uL (ref 140–400)
RBC: 5.37 10*6/uL (ref 4.20–5.80)
RDW: 16.6 % — AB (ref 11.0–15.0)
Total Lymphocyte: 41.5 %
WBC mixed population: 482 cells/uL (ref 200–950)
WBC: 6.1 10*3/uL (ref 3.8–10.8)

## 2017-05-14 LAB — HEPATIC FUNCTION PANEL
AG Ratio: 1.7 (calc) (ref 1.0–2.5)
ALT: 8 U/L — AB (ref 9–46)
AST: 12 U/L (ref 10–35)
Albumin: 4.2 g/dL (ref 3.6–5.1)
Alkaline phosphatase (APISO): 62 U/L (ref 40–115)
BILIRUBIN TOTAL: 0.3 mg/dL (ref 0.2–1.2)
Bilirubin, Direct: 0.1 mg/dL (ref 0.0–0.2)
Globulin: 2.5 g/dL (calc) (ref 1.9–3.7)
Indirect Bilirubin: 0.2 mg/dL (calc) (ref 0.2–1.2)
Total Protein: 6.7 g/dL (ref 6.1–8.1)

## 2017-05-17 ENCOUNTER — Telehealth (INDEPENDENT_AMBULATORY_CARE_PROVIDER_SITE_OTHER): Payer: Self-pay | Admitting: Internal Medicine

## 2017-05-17 ENCOUNTER — Other Ambulatory Visit (INDEPENDENT_AMBULATORY_CARE_PROVIDER_SITE_OTHER): Payer: Self-pay | Admitting: Internal Medicine

## 2017-05-17 DIAGNOSIS — D508 Other iron deficiency anemias: Secondary | ICD-10-CM

## 2017-05-17 NOTE — Addendum Note (Signed)
Addended by: Butch Penny on: 05/17/2017 01:40 PM   Modules accepted: Orders

## 2017-05-17 NOTE — Telephone Encounter (Signed)
I have spoken with patient 

## 2017-05-17 NOTE — Telephone Encounter (Signed)
I spoke with patient. He does not need Iron studies. Normal in 2017.

## 2017-05-17 NOTE — Telephone Encounter (Signed)
Iron studies ordered

## 2017-05-18 IMAGING — US US ABDOMEN LIMITED
1 series · 14 of 25 positions shown · non-contrast
Comparison: Right upper quadrant ultrasound May 18, 2016

CLINICAL DATA: Three days of chest discomfort. History of
hyperlipidemia and diabetes.

EXAM:
US ABDOMEN LIMITED - RIGHT UPPER QUADRANT

[Series 1: us abdomen limited · 0.24mm/px · 14 of 44 slices shown]
[im 1/44]
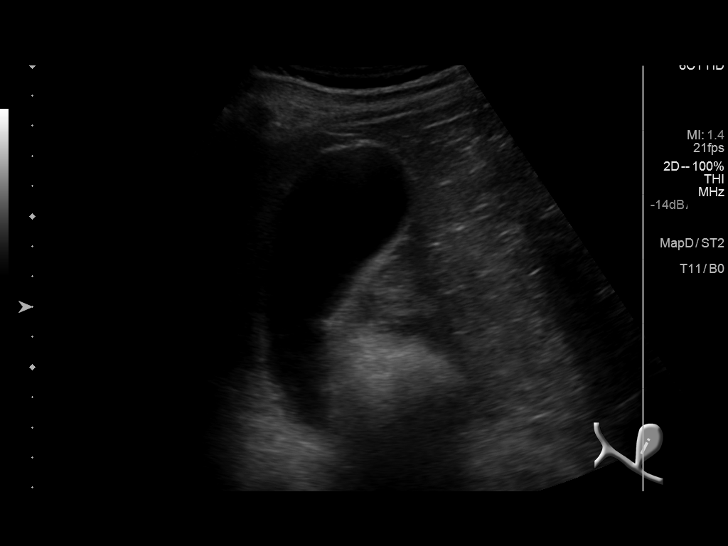
[im 4/44]
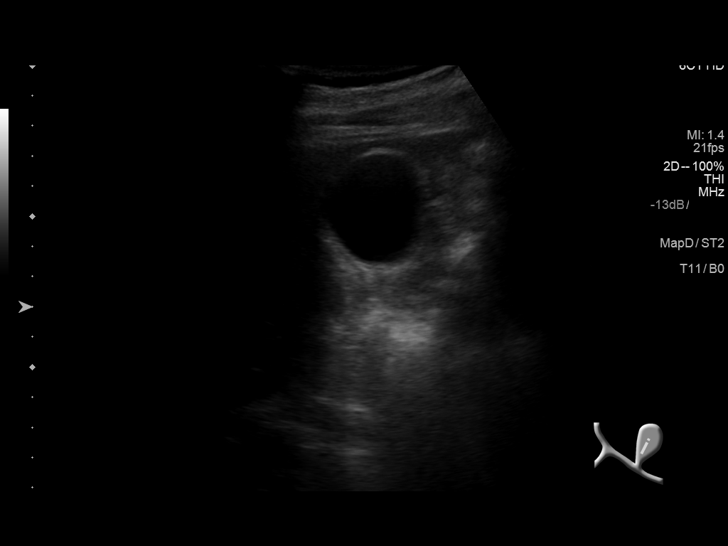
[im 8/44]
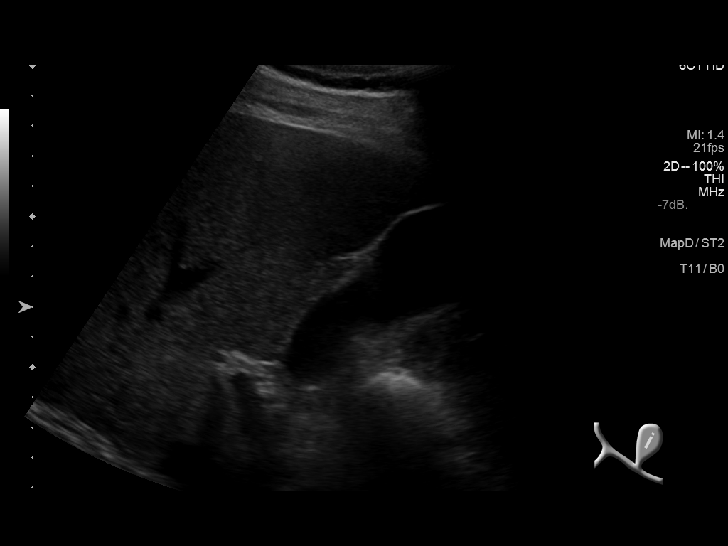
[im 11/44]
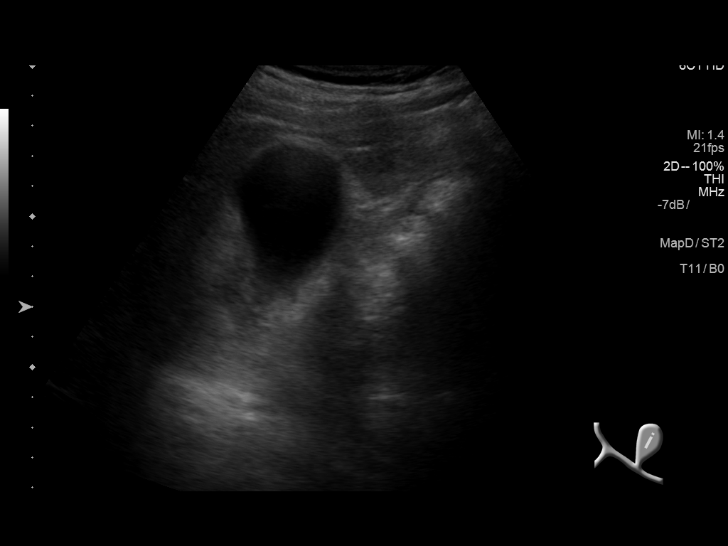
[im 15/44]
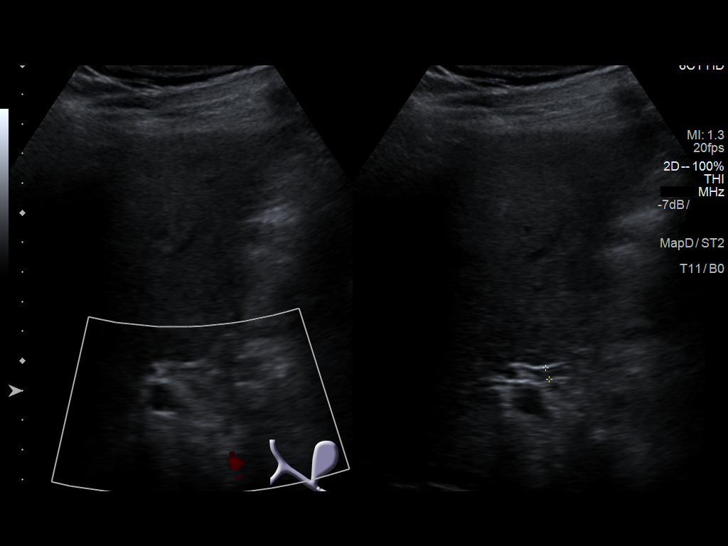
[im 17/44]
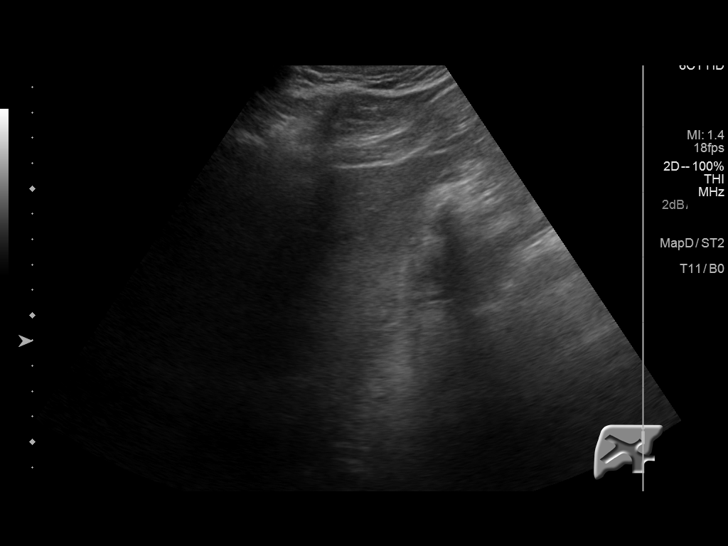
[im 20/44]
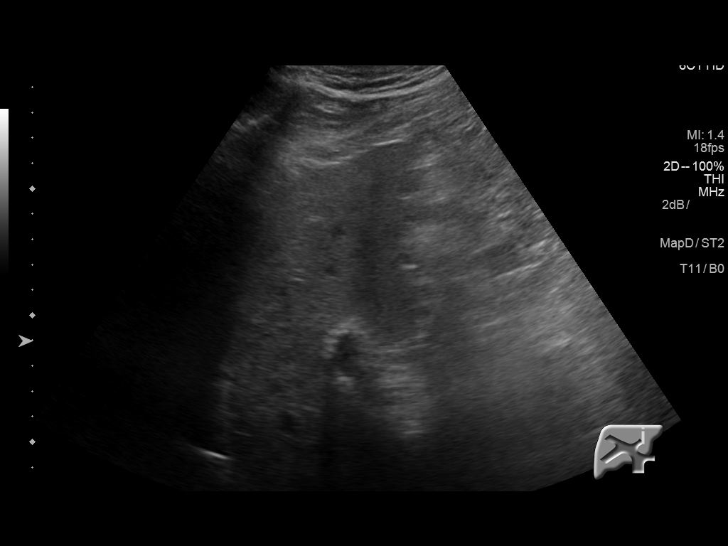
[im 24/44]
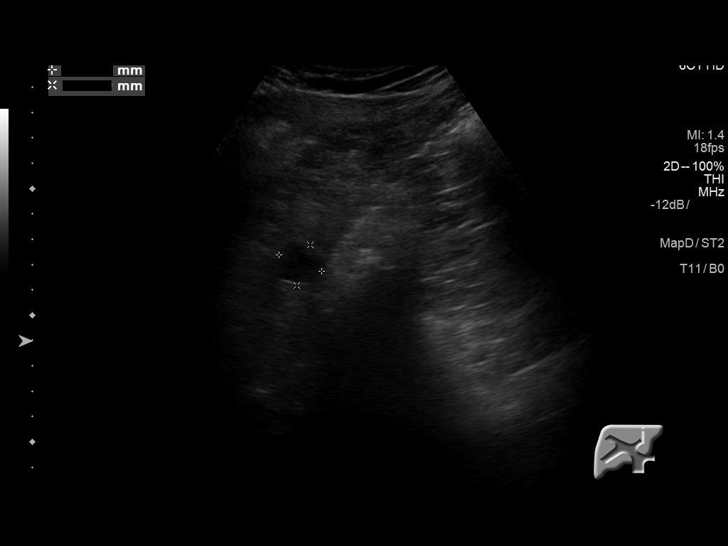
[im 27/44]
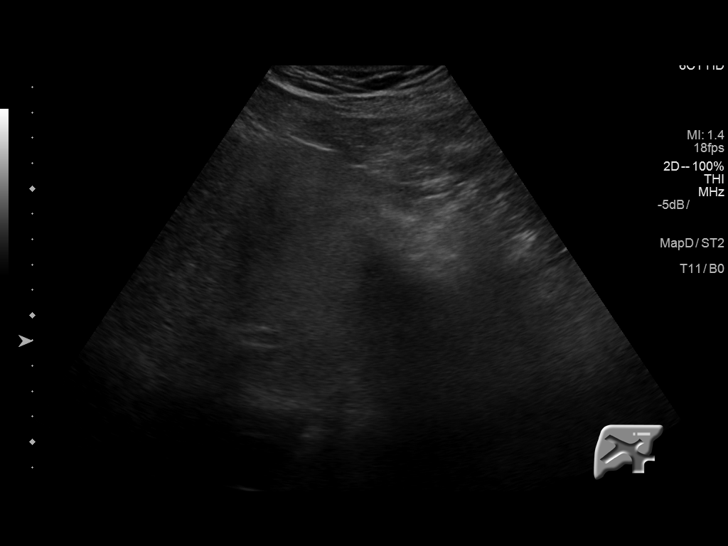
[im 29/44]
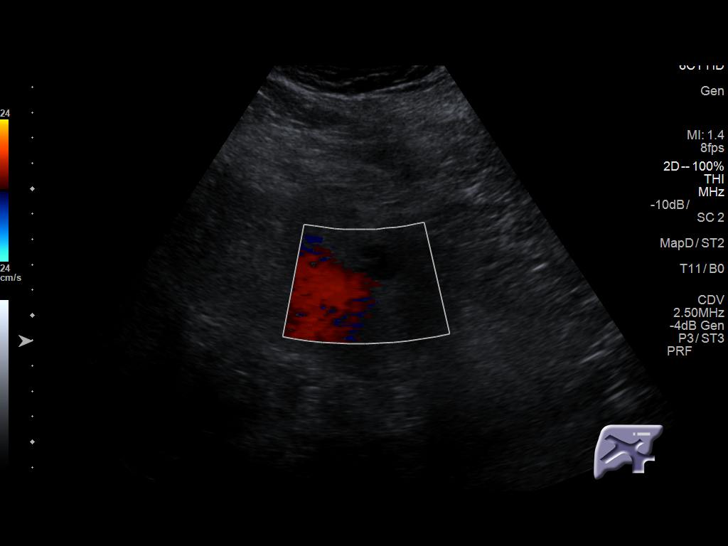
[im 33/44]
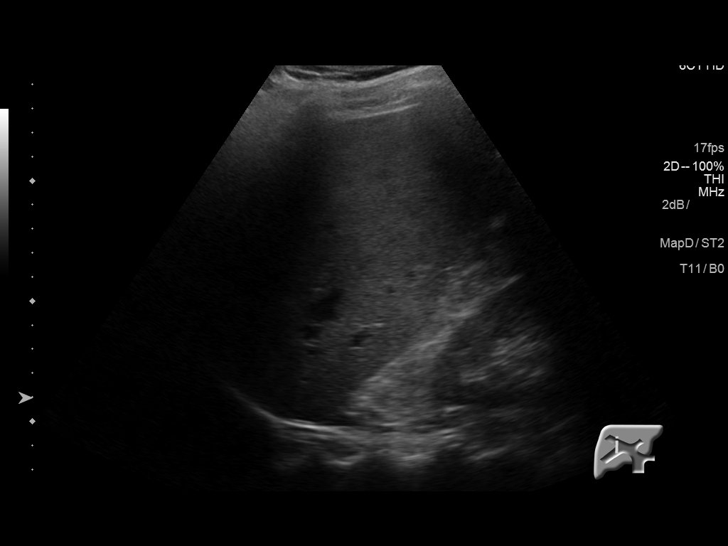
[im 36/44]
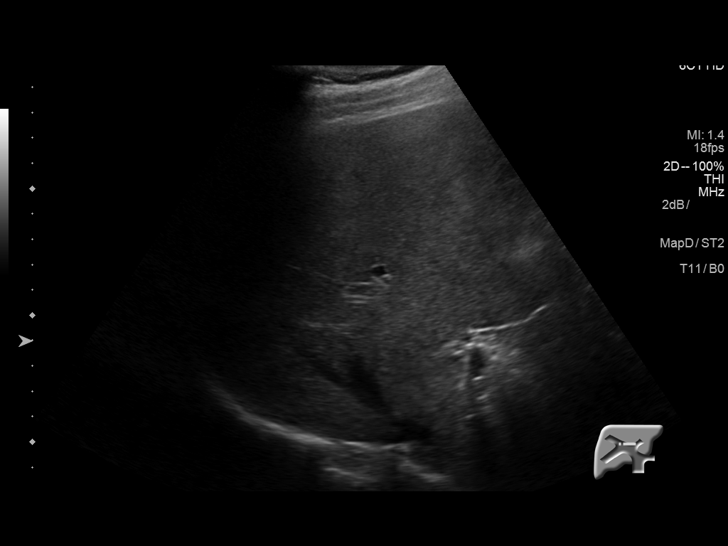
[im 40/44]
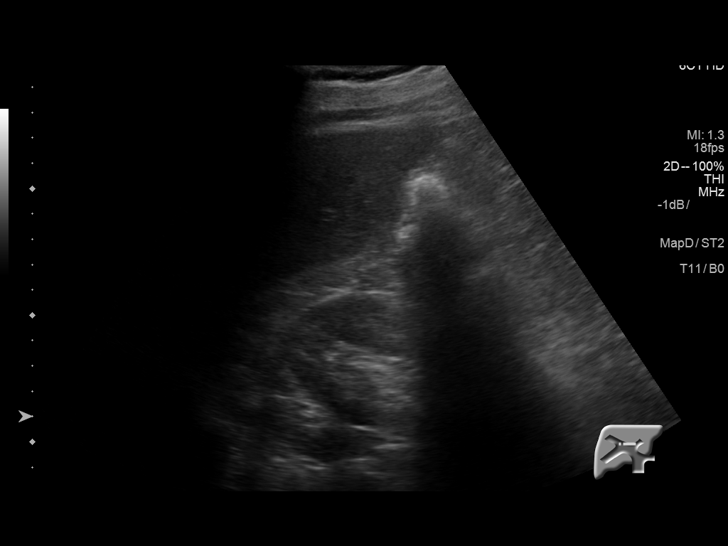
[im 44/44]
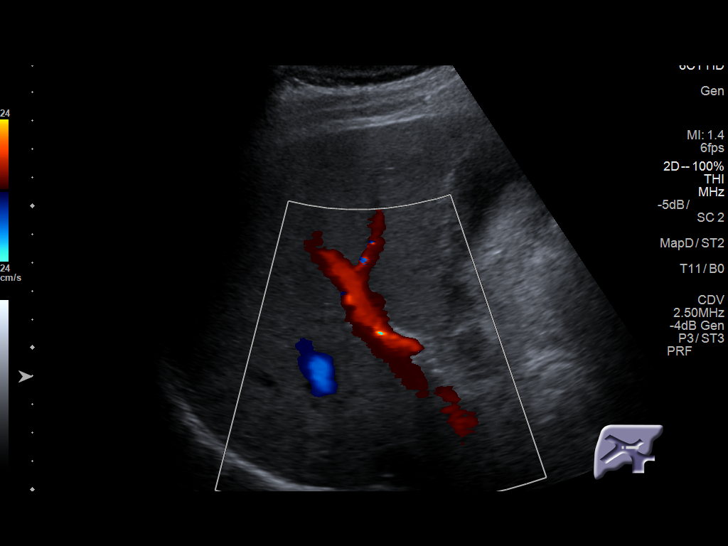

[14 of 25 positions shown; findings below may reference images not displayed]

FINDINGS: Gallbladder:

No gallstones or wall thickening visualized. No sonographic Murphy
sign noted by sonographer.

Common bile duct:

Diameter: 4 mm

Liver:

The hepatic echotexture is mildly increased. There is a partially
septated cyst in the left lobe which measures 1.8 x 1.7 x 1.9 cm.
IMPRESSION: Increased hepatic echotexture likely reflecting fatty infiltrative
change. Partially septated cyst measuring 1.9 cm in greatest
dimension in the left hepatic lobe appears stable.

No gallstones or sonographic evidence of acute cholecystitis. If
there are clinical concerns of chronic cholecystitis, a nuclear
medicine hepatobiliary scan with gallbladder ejection fraction
determination may be useful.

## 2017-06-03 ENCOUNTER — Telehealth (INDEPENDENT_AMBULATORY_CARE_PROVIDER_SITE_OTHER): Payer: Self-pay | Admitting: *Deleted

## 2017-06-03 ENCOUNTER — Encounter (INDEPENDENT_AMBULATORY_CARE_PROVIDER_SITE_OTHER): Payer: Self-pay | Admitting: *Deleted

## 2017-06-03 MED ORDER — PEG 3350-KCL-NA BICARB-NACL 420 G PO SOLR: 4000.0000 mL | Freq: Once | ORAL | 0 refills | Status: AC

## 2017-06-03 NOTE — Telephone Encounter (Signed)
Patient needs trilyte 

## 2017-06-14 ENCOUNTER — Telehealth (INDEPENDENT_AMBULATORY_CARE_PROVIDER_SITE_OTHER): Payer: Self-pay | Admitting: *Deleted

## 2017-06-14 NOTE — Telephone Encounter (Signed)
agree

## 2017-06-14 NOTE — Telephone Encounter (Signed)
Referring MD/PCP: fanta   Procedure: tcs  Reason/Indication:  Hx polyps  Has patient had this procedure before?  Yes, 2013  If so, when, by whom and where?    Is there a family history of colon cancer?  no  Who?  What age when diagnosed?    Is patient diabetic?   yes      Does patient have prosthetic heart valve or mechanical valve?  no  Do you have a pacemaker?  no  Has patient ever had endocarditis? no  Has patient had joint replacement within last 12 months?  no  Is patient constipated or take laxatives? no  Does patient have a history of alcohol/drug use?  no  Is patient on Coumadin, Plavix and/or Aspirin? yes  Medications: asa 81 mg daily, jardiance 10 mg daily, toujeo sliding scale, metformin 500 mg daily  Allergies: nkda  Medication Adjustment per Dr Laural Golden: asa 2 days, don't take diabetic medicine evening before and morning of procedure  Procedure date & time: 07/15/17 at 1200

## 2017-06-24 DIAGNOSIS — I10 Essential (primary) hypertension: Secondary | ICD-10-CM | POA: Diagnosis not present

## 2017-06-24 DIAGNOSIS — E785 Hyperlipidemia, unspecified: Secondary | ICD-10-CM | POA: Diagnosis not present

## 2017-06-24 DIAGNOSIS — R69 Illness, unspecified: Secondary | ICD-10-CM | POA: Diagnosis not present

## 2017-06-24 DIAGNOSIS — E1165 Type 2 diabetes mellitus with hyperglycemia: Secondary | ICD-10-CM | POA: Diagnosis not present

## 2017-06-24 DIAGNOSIS — K760 Fatty (change of) liver, not elsewhere classified: Secondary | ICD-10-CM | POA: Diagnosis not present

## 2017-07-09 DIAGNOSIS — H524 Presbyopia: Secondary | ICD-10-CM | POA: Diagnosis not present

## 2017-07-09 DIAGNOSIS — H40013 Open angle with borderline findings, low risk, bilateral: Secondary | ICD-10-CM | POA: Diagnosis not present

## 2017-07-14 ENCOUNTER — Encounter (INDEPENDENT_AMBULATORY_CARE_PROVIDER_SITE_OTHER): Payer: Self-pay | Admitting: *Deleted

## 2017-09-09 ENCOUNTER — Encounter (HOSPITAL_COMMUNITY): Payer: Self-pay | Admitting: *Deleted

## 2017-09-09 ENCOUNTER — Encounter (HOSPITAL_COMMUNITY)
Admission: RE | Disposition: A | Discharge status: Home / Self Care | Payer: Self-pay | Source: Ambulatory Visit | Attending: Internal Medicine

## 2017-09-09 ENCOUNTER — Other Ambulatory Visit: Payer: Self-pay

## 2017-09-09 ENCOUNTER — Ambulatory Visit (HOSPITAL_COMMUNITY)
Admission: RE | Admit: 2017-09-09 | Discharge: 2017-09-09 | Disposition: A | Discharge status: Home / Self Care | Payer: Medicare HMO | Source: Ambulatory Visit | Attending: Internal Medicine | Admitting: Internal Medicine

## 2017-09-09 DIAGNOSIS — E7800 Pure hypercholesterolemia, unspecified: Secondary | ICD-10-CM | POA: Diagnosis not present

## 2017-09-09 DIAGNOSIS — Z87891 Personal history of nicotine dependence: Secondary | ICD-10-CM | POA: Diagnosis not present

## 2017-09-09 DIAGNOSIS — I10 Essential (primary) hypertension: Secondary | ICD-10-CM | POA: Diagnosis not present

## 2017-09-09 DIAGNOSIS — Z86010 Personal history of colonic polyps: Secondary | ICD-10-CM | POA: Diagnosis not present

## 2017-09-09 DIAGNOSIS — K5730 Diverticulosis of large intestine without perforation or abscess without bleeding: Secondary | ICD-10-CM | POA: Diagnosis not present

## 2017-09-09 DIAGNOSIS — Z1211 Encounter for screening for malignant neoplasm of colon: Secondary | ICD-10-CM | POA: Diagnosis present

## 2017-09-09 DIAGNOSIS — K648 Other hemorrhoids: Secondary | ICD-10-CM | POA: Diagnosis not present

## 2017-09-09 DIAGNOSIS — Z79899 Other long term (current) drug therapy: Secondary | ICD-10-CM | POA: Diagnosis not present

## 2017-09-09 DIAGNOSIS — Z7984 Long term (current) use of oral hypoglycemic drugs: Secondary | ICD-10-CM | POA: Insufficient documentation

## 2017-09-09 DIAGNOSIS — Z7982 Long term (current) use of aspirin: Secondary | ICD-10-CM | POA: Diagnosis not present

## 2017-09-09 DIAGNOSIS — E119 Type 2 diabetes mellitus without complications: Secondary | ICD-10-CM | POA: Diagnosis not present

## 2017-09-09 DIAGNOSIS — D123 Benign neoplasm of transverse colon: Secondary | ICD-10-CM | POA: Diagnosis not present

## 2017-09-09 DIAGNOSIS — Z09 Encounter for follow-up examination after completed treatment for conditions other than malignant neoplasm: Secondary | ICD-10-CM | POA: Diagnosis not present

## 2017-09-09 HISTORY — PX: POLYPECTOMY: SHX5525

## 2017-09-09 HISTORY — DX: Pure hypercholesterolemia, unspecified: E78.00

## 2017-09-09 HISTORY — PX: COLONOSCOPY: SHX5424

## 2017-09-09 LAB — GLUCOSE, CAPILLARY: Glucose-Capillary: 222 mg/dL — ABNORMAL HIGH (ref 65–99)

## 2017-09-09 SURGERY — COLONOSCOPY
Anesthesia: Moderate Sedation

## 2017-09-09 MED ORDER — MIDAZOLAM HCL 5 MG/5ML IJ SOLN
INTRAMUSCULAR | Status: DC | PRN
  Administered 2017-09-09: 1 mg via INTRAVENOUS
  Administered 2017-09-09 (×2): 2 mg via INTRAVENOUS

## 2017-09-09 MED ORDER — MIDAZOLAM HCL 5 MG/5ML IJ SOLN
INTRAMUSCULAR | Status: DC
Start: 2017-09-09 — End: 2017-09-09
  Filled 2017-09-09: qty 10

## 2017-09-09 MED ORDER — STERILE WATER FOR IRRIGATION IR SOLN
Status: DC | PRN
  Administered 2017-09-09: 15 mL

## 2017-09-09 MED ORDER — MEPERIDINE HCL 50 MG/ML IJ SOLN
INTRAMUSCULAR | Status: AC
  Filled 2017-09-09: qty 1

## 2017-09-09 MED ORDER — MEPERIDINE HCL 50 MG/ML IJ SOLN
INTRAMUSCULAR | Status: DC | PRN
  Administered 2017-09-09 (×2): 25 mg via INTRAVENOUS

## 2017-09-09 MED ORDER — SODIUM CHLORIDE 0.9 % IV SOLN
INTRAVENOUS | Status: DC
  Administered 2017-09-09: 11:00:00 via INTRAVENOUS

## 2017-09-09 NOTE — Discharge Instructions (Signed)
Resume aspirin on 09/10/2017. Resume other medications as before. High fiber diet. No driving for 24 hours. Physician will call with biopsy results.   Colonoscopy, Adult, Care After This sheet gives you information about how to care for yourself after your procedure. Your doctor may also give you more specific instructions. If you have problems or questions, call your doctor. Follow these instructions at home: General instructions   For the first 24 hours after the procedure: ? Do not drive or use machinery. ? Do not sign important documents. ? Do not drink alcohol. ? Do your daily activities more slowly than normal. ? Eat foods that are soft and easy to digest. ? Rest often.  Take over-the-counter or prescription medicines only as told by your doctor.  It is up to you to get the results of your procedure. Ask your doctor, or the department performing the procedure, when your results will be ready. To help cramping and bloating:  Try walking around.  Put heat on your belly (abdomen) as told by your doctor. Use a heat source that your doctor recommends, such as a moist heat pack or a heating pad. ? Put a towel between your skin and the heat source. ? Leave the heat on for 20-30 minutes. ? Remove the heat if your skin turns bright red. This is especially important if you cannot feel pain, heat, or cold. You can get burned. Eating and drinking  Drink enough fluid to keep your pee (urine) clear or pale yellow.  Return to your normal diet as told by your doctor. Avoid heavy or fried foods that are hard to digest.  Avoid drinking alcohol for as long as told by your doctor. Contact a doctor if:  You have blood in your poop (stool) 2-3 days after the procedure. Get help right away if:  You have more than a small amount of blood in your poop.  You see large clumps of tissue (blood clots) in your poop.  Your belly is swollen.  You feel sick to your stomach (nauseous).  You  throw up (vomit).  You have a fever.  You have belly pain that gets worse, and medicine does not help your pain. This information is not intended to replace advice given to you by your health care provider. Make sure you discuss any questions you have with your health care provider. Document Released: 07/18/2010 Document Revised: 03/09/2016 Document Reviewed: 03/09/2016 Elsevier Interactive Patient Education  2017 [employer]  Hemorrhoids Hemorrhoids are swollen veins in and around the rectum or anus. There are two types of hemorrhoids:  Internal hemorrhoids. These occur in the veins that are just inside the rectum. They may poke through to the outside and become irritated and painful.  External hemorrhoids. These occur in the veins that are outside of the anus and can be felt as a painful swelling or hard lump near the anus.  Most hemorrhoids do not cause serious problems, and they can be managed with home treatments such as diet and lifestyle changes. If home treatments do not help your symptoms, procedures can be done to shrink or remove the hemorrhoids. What are the causes? This condition is caused by increased pressure in the anal area. This pressure may result from various things, including:  Constipation.  Straining to have a bowel movement.  Diarrhea.  Pregnancy.  Obesity.  Sitting for long periods of time.  Heavy lifting or other activity that causes you to strain.  Anal sex.  What are the signs  or symptoms? Symptoms of this condition include:  Pain.  Anal itching or irritation.  Rectal bleeding.  Leakage of stool (feces).  Anal swelling.  One or more lumps around the anus.  How is this diagnosed? This condition can often be diagnosed through a visual exam. Other exams or tests may also be done, such as:  Examination of the rectal area with a gloved hand (digital rectal exam).  Examination of the anal canal using a small tube (anoscope).  A blood  test, if you have lost a significant amount of blood.  A test to look inside the colon (sigmoidoscopy or colonoscopy).  How is this treated? This condition can usually be treated at home. However, various procedures may be done if dietary changes, lifestyle changes, and other home treatments do not help your symptoms. These procedures can help make the hemorrhoids smaller or remove them completely. Some of these procedures involve surgery, and others do not. Common procedures include:  Rubber band ligation. Rubber bands are placed at the base of the hemorrhoids to cut off the blood supply to them.  Sclerotherapy. Medicine is injected into the hemorrhoids to shrink them.  Infrared coagulation. A type of light energy is used to get rid of the hemorrhoids.  Hemorrhoidectomy surgery. The hemorrhoids are surgically removed, and the veins that supply them are tied off.  Stapled hemorrhoidopexy surgery. A circular stapling device is used to remove the hemorrhoids and use staples to cut off the blood supply to them.  Follow these instructions at home: Eating and drinking  Eat foods that have a lot of fiber in them, such as whole grains, beans, nuts, fruits, and vegetables. Ask your health care provider about taking products that have added fiber (fiber supplements).  Drink enough fluid to keep your urine clear or pale yellow. Managing pain and swelling  Take warm sitz baths for 20 minutes, 3-4 times a day to ease pain and discomfort.  If directed, apply ice to the affected area. Using ice packs between sitz baths may be helpful. ? Put ice in a plastic bag. ? Place a towel between your skin and the bag. ? Leave the ice on for 20 minutes, 2-3 times a day. General instructions  Take over-the-counter and prescription medicines only as told by your health care provider.  Use medicated creams or suppositories as told.  Exercise regularly.  Go to the bathroom when you have the urge to have a  bowel movement. Do not wait.  Avoid straining to have bowel movements.  Keep the anal area dry and clean. Use wet toilet paper or moist towelettes after a bowel movement.  Do not sit on the toilet for long periods of time. This increases blood pooling and pain. Contact a health care provider if:  You have increasing pain and swelling that are not controlled by treatment or medicine.  You have uncontrolled bleeding.  You have difficulty having a bowel movement, or you are unable to have a bowel movement.  You have pain or inflammation outside the area of the hemorrhoids. This information is not intended to replace advice given to you by your health care provider. Make sure you discuss any questions you have with your health care provider. Document Released: 06/12/2000 Document Revised: 11/13/2015 Document Reviewed: 02/27/2015 Elsevier Interactive Patient Education  2018 [employer].  Diverticulosis Diverticulosis is a condition that develops when small pouches (diverticula) form in the wall of the large intestine (colon). The colon is where water is absorbed and stool  is formed. The pouches form when the inside layer of the colon pushes through weak spots in the outer layers of the colon. You may have a few pouches or many of them. What are the causes? The cause of this condition is not known. What increases the risk? The following factors may make you more likely to develop this condition:  Being older than age 54. Your risk for this condition increases with age. Diverticulosis is rare among people younger than age 58. By age 41, many people have it.  Eating a low-fiber diet.  Having frequent constipation.  Being overweight.  Not getting enough exercise.  Smoking.  Taking over-the-counter pain medicines, like aspirin and ibuprofen.  Having a family history of diverticulosis.  What are the signs or symptoms? In most people, there are no symptoms of this condition. If you  do have symptoms, they may include:  Bloating.  Cramps in the abdomen.  Constipation or diarrhea.  Pain in the lower left side of the abdomen.  How is this diagnosed? This condition is most often diagnosed during an exam for other colon problems. Because diverticulosis usually has no symptoms, it often cannot be diagnosed independently. This condition may be diagnosed by:  Using a flexible scope to examine the colon (colonoscopy).  Taking an X-ray of the colon after dye has been put into the colon (barium enema).  Doing a CT scan.  How is this treated? You may not need treatment for this condition if you have never developed an infection related to diverticulosis. If you have had an infection before, treatment may include:  Eating a high-fiber diet. This may include eating more fruits, vegetables, and grains.  Taking a fiber supplement.  Taking a live bacteria supplement (probiotic).  Taking medicine to relax your colon.  Taking antibiotic medicines.  Follow these instructions at home:  Drink 6-8 glasses of water or more each day to prevent constipation.  Try not to strain when you have a bowel movement.  If you have had an infection before: ? Eat more fiber as directed by your health care provider or your diet and nutrition specialist (dietitian). ? Take a fiber supplement or probiotic, if your health care provider approves.  Take over-the-counter and prescription medicines only as told by your health care provider.  If you were prescribed an antibiotic, take it as told by your health care provider. Do not stop taking the antibiotic even if you start to feel better.  Keep all follow-up visits as told by your health care provider. This is important. Contact a health care provider if:  You have pain in your abdomen.  You have bloating.  You have cramps.  You have not had a bowel movement in 3 days. Get help right away if:  Your pain gets worse.  Your  bloating becomes very bad.  You have a fever or chills, and your symptoms suddenly get worse.  You vomit.  You have bowel movements that are bloody or black.  You have bleeding from your rectum. Summary  Diverticulosis is a condition that develops when small pouches (diverticula) form in the wall of the large intestine (colon).  You may have a few pouches or many of them.  This condition is most often diagnosed during an exam for other colon problems.  If you have had an infection related to diverticulosis, treatment may include increasing the fiber in your diet, taking supplements, or taking medicines. This information is not intended to replace advice given to  you by your health care provider. Make sure you discuss any questions you have with your health care provider. Document Released: 03/12/2004 Document Revised: 05/04/2016 Document Reviewed: 05/04/2016 Elsevier Interactive Patient Education  2017 [place].  Colon Polyps Polyps are tissue growths inside the body. Polyps can grow in many places, including the large intestine (colon). A polyp may be a round bump or a mushroom-shaped growth. You could have one polyp or several. Most colon polyps are noncancerous (benign). However, some colon polyps can become cancerous over time. What are the causes? The exact cause of colon polyps is not known. What increases the risk? This condition is more likely to develop in people who:  Have a family history of colon cancer or colon polyps.  Are older than 69 or older than 45 if they are African American.  Have inflammatory bowel disease, such as ulcerative colitis or Crohn disease.  Are overweight.  Smoke cigarettes.  Do not get enough exercise.  Drink too much alcohol.  Eat a diet that is: ? High in fat and red meat. ? Low in fiber.  Had childhood cancer that was treated with abdominal radiation.  What are the signs or symptoms? Most polyps do not cause symptoms. If  you have symptoms, they may include:  Blood coming from your rectum when having a bowel movement.  Blood in your stool.The stool may look dark red or black.  A change in bowel habits, such as constipation or diarrhea.  How is this diagnosed? This condition is diagnosed with a colonoscopy. This is a procedure that uses a lighted, flexible scope to look at the inside of your colon. How is this treated? Treatment for this condition involves removing any polyps that are found. Those polyps will then be tested for cancer. If cancer is found, your health care provider will talk to you about options for colon cancer treatment. Follow these instructions at home: Diet  Eat plenty of fiber, such as fruits, vegetables, and whole grains.  Eat foods that are high in calcium and vitamin D, such as milk, cheese, yogurt, eggs, liver, fish, and broccoli.  Limit foods high in fat, red meats, and processed meats, such as hot dogs, sausage, bacon, and lunch meats.  Maintain a healthy weight, or lose weight if recommended by your health care provider. General instructions  Do not smoke cigarettes.  Do not drink alcohol excessively.  Keep all follow-up visits as told by your health care provider. This is important. This includes keeping regularly scheduled colonoscopies. Talk to your health care provider about when you need a colonoscopy.  Exercise every day or as told by your health care provider. Contact a health care provider if:  You have new or worsening bleeding during a bowel movement.  You have new or increased blood in your stool.  You have a change in bowel habits.  You unexpectedly lose weight. This information is not intended to replace advice given to you by your health care provider. Make sure you discuss any questions you have with your health care provider. Document Released: 03/11/2004 Document Revised: 11/21/2015 Document Reviewed: 05/06/2015 Elsevier Interactive Patient  Education  [employer].

## 2017-09-09 NOTE — H&P (Signed)
Gerald Evans is an 72 y.o. male.   Chief Complaint: Patient is here for colonoscopy. HPI: Patient is 72 year old Afro-American male who is here for surveillance colonoscopy.  He has history of colonic adenomas and 2 prior colonoscopies.  He had one removed on last exam in 2013.  He denies abdominal pain change in bowel habits or rectal bleeding. Family history is negative for CRC.  Past Medical History:  Diagnosis Date  . Colon polyps   . Diabetes mellitus without complication ([place])   . Fatty liver   . Hypercholesteremia   . Hypertension     Past Surgical History:  Procedure Laterality Date  . COLONOSCOPY    . COLONOSCOPY  04/21/2012   Procedure: COLONOSCOPY;  Surgeon: Rogene Houston, MD;  [facility];  Service: Endoscopy;  Laterality: N/A;  1200  . HERNIA REPAIR      Family History  Problem Relation Age of Onset  . Hypertension Father   . Diabetes Sister   . Hypertension Sister   . Healthy Son   . Healthy Daughter   . Colon cancer Neg Hx    Social History:  reports that he has quit smoking. His smoking use included cigarettes. He has a 10.00 pack-year smoking history. he has never used smokeless tobacco. He reports that he drinks about 2.0 oz of alcohol per week. He reports that he does not use drugs.  Allergies: No Known Allergies  Medications Prior to Admission  Medication Sig Dispense Refill  . amLODipine (NORVASC) 5 MG tablet Take 5 mg by mouth daily.     Marland Kitchen aspirin EC 81 MG tablet Take 81 mg by mouth daily.    . empagliflozin (JARDIANCE) 10 MG TABS tablet Take 10 mg by mouth daily.    . hydrochlorothiazide (HYDRODIURIL) 25 MG tablet Take 25 mg by mouth daily.    . Insulin Glargine (TOUJEO SOLOSTAR [place]) Inject 30-50 Units into the skin at bedtime. If blood sugars in the 100-175=30 units If greater than 175=50 units.    Marland Kitchen lisinopril (PRINIVIL,ZESTRIL) 40 MG tablet Take 40 mg by mouth daily.    . metFORMIN (GLUCOPHAGE) 500 MG tablet Take 500 mg by mouth 2  (two) times daily with a meal.     . metoprolol (LOPRESSOR) 100 MG tablet Take 100 mg by mouth 2 (two) times daily.     . simvastatin (ZOCOR) 20 MG tablet Take 20 mg by mouth daily.      Results for orders placed or performed during the hospital encounter of 09/09/17 (from the past 48 hour(s))  Glucose, capillary     Status: Abnormal   Collection Time: 09/09/17 10:58 AM  Result Value Ref Range   Glucose-Capillary 222 (H) 65 - 99 mg/dL   No results found.  ROS  Blood pressure (!) 143/80, pulse 74, temperature 97.7 F (36.5 C), temperature source Oral, resp. rate 20, height 5' 8"  (1.727 m), weight 235 lb (106.6 kg), SpO2 98 %. Physical Exam  Constitutional: He appears well-developed and well-nourished.  HENT:  Mouth/Throat: Oropharynx is clear and moist.  Eyes: Conjunctivae are normal. No scleral icterus.  Neck: No thyromegaly present.  Cardiovascular: Normal rate, regular rhythm and normal heart sounds.  No murmur heard. Respiratory: Effort normal and breath sounds normal.  GI:  Abdomen is full but soft and nontender with organomegaly or masses.  Lymphadenopathy:    He has no cervical adenopathy.     Assessment/Plan History of colonic adenomas. Surveillance colonoscopy.  Hildred Laser, MD 09/09/2017,  11:10 AM

## 2017-09-09 NOTE — Op Note (Signed)
[facility] Patient Name: Gerald Evans Procedure Date: 09/09/2017 10:51 AM MRN: [id number] Date of Birth: August 31, 1945 Attending MD: Hildred Laser , MD CSN: [id number] Age: 72 Admit Type: Outpatient Procedure:                Colonoscopy Indications:              High risk colon cancer surveillance: Personal                            history of colonic polyps Providers:                Hildred Laser, MD, Charlsie Quest. Theda Sers RN, RN, Nelma Rothman, Technician Referring MD:             Rosita Fire, MD Medicines:                Meperidine 50 mg IV, Midazolam 5 mg IV Complications:            No immediate complications. Estimated Blood Loss:     Estimated blood loss was minimal. Procedure:                Pre-Anesthesia Assessment:                           - Prior to the procedure, a History and Physical                            was performed, and patient medications and                            allergies were reviewed. The patient's tolerance of                            previous anesthesia was also reviewed. The risks                            and benefits of the procedure and the sedation                            options and risks were discussed with the patient.                            All questions were answered, and informed consent                            was obtained. Prior Anticoagulants: The patient                            last took aspirin 10 days prior to the procedure.                            ASA Grade Assessment: II - A patient with mild  systemic disease. After reviewing the risks and                            benefits, the patient was deemed in satisfactory                            condition to undergo the procedure.                           After obtaining informed consent, the colonoscope                            was passed under direct vision. Throughout the   procedure, the patient's blood pressure, pulse, and                            oxygen saturations were monitored continuously. The                            EC-[id number] ([id number]) scope was introduced through                            the anus and advanced to the the cecum, identified                            by appendiceal orifice and ileocecal valve. The                            colonoscopy was somewhat difficult due to a                            tortuous colon. The patient tolerated the procedure                            well. The quality of the bowel preparation was                            excellent. The ileocecal valve, appendiceal                            orifice, and rectum, the ileocecal valve and the                            rectum were photographed. Scope In: 11:20:51 AM Scope Out: 11:53:09 AM Scope Withdrawal Time: 0 hours 12 minutes 23 seconds  Total Procedure Duration: 0 hours 32 minutes 18 seconds  Findings:      The perianal and digital rectal examinations were normal.      A small polyp was found in the transverse colon. The polyp was sessile.       Biopsies were taken with a cold forceps for histology.      A few small-mouthed diverticula were found in the sigmoid colon.      Internal hemorrhoids were found during retroflexion. The hemorrhoids       were medium-sized. Impression:               -  One small polyp in the transverse colon. Biopsied.                           - Diverticulosis in the sigmoid colon.                           - Internal hemorrhoids. Moderate Sedation:      Moderate (conscious) sedation was administered by the endoscopy nurse       and supervised by the endoscopist. The following parameters were       monitored: oxygen saturation, heart rate, blood pressure, CO2       capnography and response to care. Total physician intraservice time was       38 minutes. Recommendation:           - Patient has a contact number available for                             emergencies. The signs and symptoms of potential                            delayed complications were discussed with the                            patient. Return to normal activities tomorrow.                            Written discharge instructions were provided to the                            patient.                           - High fiber diet and diabetic (ADA) diet today.                           - Continue present medications.                           - No aspirin, ibuprofen, naproxen, or other                            non-steroidal anti-inflammatory drugs for 1 day.                           - Await pathology results.                           - Repeat colonoscopy in 7 years for surveillance. Procedure Code(s):        --- Professional ---                           [phone], Colonoscopy, flexible; with biopsy, single                            or multiple  [id number], Moderate sedation services provided by the                            same physician or other qualified health care                            professional performing the diagnostic or                            therapeutic service that the sedation supports,                            requiring the presence of an independent trained                            observer to assist in the monitoring of the                            patient's level of consciousness and physiological                            status; initial 15 minutes of intraservice time,                            patient age 61 years or older                           [phone], Moderate sedation services; each additional                            15 minutes intraservice time                           99153, Moderate sedation services; each additional                            15 minutes intraservice time Diagnosis Code(s):        --- Professional ---                           Z86.010, Personal history  of colonic polyps                           D12.3, Benign neoplasm of transverse colon (hepatic                            flexure or splenic flexure)                           K64.8, Other hemorrhoids                           K57.30, Diverticulosis of large intestine without                            perforation or  abscess without bleeding CPT copyright 2016 American [employer]. All rights reserved. The codes documented in this report are preliminary and upon coder review may  be revised to meet current compliance requirements. Hildred Laser, MD Hildred Laser, MD 09/09/2017 12:02:31 PM This report has been signed electronically. Number of Addenda: 0

## 2017-09-10 DIAGNOSIS — R69 Illness, unspecified: Secondary | ICD-10-CM | POA: Diagnosis not present

## 2017-09-15 ENCOUNTER — Encounter (HOSPITAL_COMMUNITY): Payer: Self-pay | Admitting: Internal Medicine

## 2017-09-21 DIAGNOSIS — Z6835 Body mass index (BMI) 35.0-35.9, adult: Secondary | ICD-10-CM | POA: Diagnosis not present

## 2017-09-21 DIAGNOSIS — Z0000 Encounter for general adult medical examination without abnormal findings: Secondary | ICD-10-CM | POA: Diagnosis not present

## 2017-09-21 DIAGNOSIS — E119 Type 2 diabetes mellitus without complications: Secondary | ICD-10-CM | POA: Diagnosis not present

## 2017-09-21 DIAGNOSIS — I10 Essential (primary) hypertension: Secondary | ICD-10-CM | POA: Diagnosis not present

## 2017-09-21 DIAGNOSIS — E785 Hyperlipidemia, unspecified: Secondary | ICD-10-CM | POA: Diagnosis not present

## 2017-09-21 DIAGNOSIS — N529 Male erectile dysfunction, unspecified: Secondary | ICD-10-CM | POA: Diagnosis not present

## 2017-09-21 DIAGNOSIS — N401 Enlarged prostate with lower urinary tract symptoms: Secondary | ICD-10-CM | POA: Diagnosis not present

## 2017-09-21 DIAGNOSIS — E1165 Type 2 diabetes mellitus with hyperglycemia: Secondary | ICD-10-CM | POA: Diagnosis not present

## 2017-11-15 DIAGNOSIS — R69 Illness, unspecified: Secondary | ICD-10-CM | POA: Diagnosis not present

## 2017-12-15 DIAGNOSIS — E1165 Type 2 diabetes mellitus with hyperglycemia: Secondary | ICD-10-CM | POA: Diagnosis not present

## 2017-12-15 DIAGNOSIS — I10 Essential (primary) hypertension: Secondary | ICD-10-CM | POA: Diagnosis not present

## 2017-12-15 DIAGNOSIS — E785 Hyperlipidemia, unspecified: Secondary | ICD-10-CM | POA: Diagnosis not present

## 2017-12-15 DIAGNOSIS — N401 Enlarged prostate with lower urinary tract symptoms: Secondary | ICD-10-CM | POA: Diagnosis not present

## 2017-12-20 DIAGNOSIS — I10 Essential (primary) hypertension: Secondary | ICD-10-CM | POA: Diagnosis not present

## 2017-12-20 DIAGNOSIS — Z79899 Other long term (current) drug therapy: Secondary | ICD-10-CM | POA: Diagnosis not present

## 2017-12-20 DIAGNOSIS — S61332A Puncture wound without foreign body of right middle finger with damage to nail, initial encounter: Secondary | ICD-10-CM | POA: Diagnosis not present

## 2017-12-20 DIAGNOSIS — W228XXA Striking against or struck by other objects, initial encounter: Secondary | ICD-10-CM | POA: Diagnosis not present

## 2017-12-20 DIAGNOSIS — Z794 Long term (current) use of insulin: Secondary | ICD-10-CM | POA: Diagnosis not present

## 2017-12-20 DIAGNOSIS — E119 Type 2 diabetes mellitus without complications: Secondary | ICD-10-CM | POA: Diagnosis not present

## 2017-12-20 DIAGNOSIS — S61232A Puncture wound without foreign body of right middle finger without damage to nail, initial encounter: Secondary | ICD-10-CM | POA: Diagnosis not present

## 2018-01-18 DIAGNOSIS — R69 Illness, unspecified: Secondary | ICD-10-CM | POA: Diagnosis not present

## 2018-02-01 ENCOUNTER — Ambulatory Visit: Payer: Medicare HMO | Admitting: Urology

## 2018-02-01 DIAGNOSIS — N5201 Erectile dysfunction due to arterial insufficiency: Secondary | ICD-10-CM | POA: Diagnosis not present

## 2018-02-01 DIAGNOSIS — R109 Unspecified abdominal pain: Secondary | ICD-10-CM

## 2018-02-07 DIAGNOSIS — I10 Essential (primary) hypertension: Secondary | ICD-10-CM | POA: Diagnosis not present

## 2018-02-07 DIAGNOSIS — E1165 Type 2 diabetes mellitus with hyperglycemia: Secondary | ICD-10-CM | POA: Diagnosis not present

## 2018-02-07 DIAGNOSIS — T464X5A Adverse effect of angiotensin-converting-enzyme inhibitors, initial encounter: Secondary | ICD-10-CM | POA: Diagnosis not present

## 2018-04-01 DIAGNOSIS — R69 Illness, unspecified: Secondary | ICD-10-CM | POA: Diagnosis not present

## 2018-05-10 DIAGNOSIS — Z23 Encounter for immunization: Secondary | ICD-10-CM | POA: Diagnosis not present

## 2018-05-10 DIAGNOSIS — R69 Illness, unspecified: Secondary | ICD-10-CM | POA: Diagnosis not present

## 2018-05-10 DIAGNOSIS — Z0001 Encounter for general adult medical examination with abnormal findings: Secondary | ICD-10-CM | POA: Diagnosis not present

## 2018-05-10 DIAGNOSIS — E1165 Type 2 diabetes mellitus with hyperglycemia: Secondary | ICD-10-CM | POA: Diagnosis not present

## 2018-05-10 DIAGNOSIS — I10 Essential (primary) hypertension: Secondary | ICD-10-CM | POA: Diagnosis not present

## 2018-05-10 DIAGNOSIS — Z1389 Encounter for screening for other disorder: Secondary | ICD-10-CM | POA: Diagnosis not present

## 2018-05-10 DIAGNOSIS — Z1331 Encounter for screening for depression: Secondary | ICD-10-CM | POA: Diagnosis not present

## 2018-05-10 DIAGNOSIS — E785 Hyperlipidemia, unspecified: Secondary | ICD-10-CM | POA: Diagnosis not present

## 2018-05-10 DIAGNOSIS — Z0000 Encounter for general adult medical examination without abnormal findings: Secondary | ICD-10-CM | POA: Diagnosis not present

## 2018-05-10 DIAGNOSIS — E119 Type 2 diabetes mellitus without complications: Secondary | ICD-10-CM | POA: Diagnosis not present

## 2018-05-12 ENCOUNTER — Ambulatory Visit (INDEPENDENT_AMBULATORY_CARE_PROVIDER_SITE_OTHER): Payer: Medicare HMO | Admitting: Internal Medicine

## 2018-05-12 ENCOUNTER — Encounter (INDEPENDENT_AMBULATORY_CARE_PROVIDER_SITE_OTHER): Payer: Self-pay | Admitting: Internal Medicine

## 2018-05-12 VITALS — BP 152/72 | HR 60 | Temp 97.7°F | Ht 68.0 in | Wt 237.9 lb

## 2018-05-12 DIAGNOSIS — K760 Fatty (change of) liver, not elsewhere classified: Secondary | ICD-10-CM | POA: Diagnosis not present

## 2018-05-12 NOTE — Progress Notes (Signed)
Subjective:    Patient ID: Gerald Evans, male    DOB: 04/11/1946, 72 y.o.   MRN: [id number]  HPI Here today for f/u. Last seen in November of 2018. Hx of fatty liver with normal liver enzymes. Diabetic for greater than 10 yrs.  States his sugars are good. He says he walks 4 times a weeks. He is watching what he eats. Rarely eats greasy foods. Appetite is good. He has lost 1 pound since his last visit. BMs are normal. No melnea or BRRB.  Platelet are normal.    [place] elast of 05/18/2016 revealed Corresponding Metavir fibrosis score: F 3+ F 4 08/20/2016 [place] RUQ:  Increased hepatic echotexture likely reflecting fatty infiltrative change. Partially septated cyst measuring 1.9 cm in greatest dimension in the left hepatic lobe appears stable.  08/20/2016 [place] RUQ: IMPRESSION: Increased hepatic echotexture likely reflecting fatty infiltrative change. Partially septated cyst measuring 1.9 cm in greatest dimension in the left hepatic lobe appears stable.  10/31/2016 Acute hepatitis panel negative.  Auto immune process ruled out.  05/20/2016 Ferritin 187, sedrate 7 SMA less than 20, ANA less than 20 sedrate 7, vitamin B12 302,, Folate 16.9, Iron 68, 06/10/2016 Ceruloplasmin 33, Alpha 1 Antitrypsin 117. Acute hepatitis panel negative.    Hepatic Function Latest Ref Rng & Units 05/14/2017 11/10/2016 08/19/2016  Total Protein 6.1 - 8.1 g/dL 6.7 7.0 7.1  Albumin 3.6 - 5.1 g/dL - 4.2 4.0  AST 10 - 35 U/L 12 19 16   ALT 9 - 46 U/L 8(L) 14 11(L)  Alk Phosphatase 40 - 115 U/L - 72 65  Total Bilirubin 0.2 - 1.2 mg/dL 0.3 0.3 0.7  Bilirubin, Direct 0.0 - 0.2 mg/dL 0.1 0.1 0.1   CBC    Component Value Date/Time   WBC 6.1 05/14/2017 1333   RBC 5.37 05/14/2017 1333   HGB 12.5 (L) 05/14/2017 1333   HCT 39.6 05/14/2017 1333   PLT 237 05/14/2017 1333   MCV 73.7 (L) 05/14/2017 1333   MCH 23.3 (L) 05/14/2017 1333   MCHC 31.6 (L) 05/14/2017 1333   RDW 16.6 (H) 05/14/2017 1333   LYMPHSABS 2,532  05/14/2017 1333   MONOABS 616 05/13/2016 1557   EOSABS 171 05/14/2017 1333   BASOSABS 43 05/14/2017 1333        Underwent a colonoscopy in March of this year.  One polyp removed transverse colon. Biopsy: Tubular adenoma.  Few small diverticula, internal hemorrhoids.      Review of Systems     Past Medical History:  Diagnosis Date  . Colon polyps   . Diabetes mellitus without complication ([place])   . Fatty liver   . Hypercholesteremia   . Hypertension     Past Surgical History:  Procedure Laterality Date  . COLONOSCOPY    . COLONOSCOPY  04/21/2012   Procedure: COLONOSCOPY;  Surgeon: Rogene Houston, MD;  [facility];  Service: Endoscopy;  Laterality: N/A;  1200  . COLONOSCOPY N/A 09/09/2017   Procedure: COLONOSCOPY;  Surgeon: Rogene Houston, MD;  [facility];  Service: Endoscopy;  Laterality: N/A;  1200-rescheduled to 3/14/ @12 :00pm per Lelon Frohlich  . HERNIA REPAIR    . POLYPECTOMY  09/09/2017   Procedure: POLYPECTOMY;  Surgeon: Rogene Houston, MD;  [facility]: [facility];  Service: Endoscopy;;  Transverse Colon (CB)    Allergies  Allergen Reactions  . Lisinopril     cough    Current Outpatient Medications on File Prior to Visit  Medication Sig Dispense  Refill  . amLODipine (NORVASC) 5 MG tablet Take 5 mg by mouth daily.     Marland Kitchen aspirin EC 81 MG tablet Take 1 tablet (81 mg total) by mouth daily.    . empagliflozin (JARDIANCE) 10 MG TABS tablet Take 10 mg by mouth daily.    . hydrochlorothiazide (HYDRODIURIL) 25 MG tablet Take 25 mg by mouth daily.    . Insulin Glargine (TOUJEO SOLOSTAR [place]) Inject 30-50 Units into the skin at bedtime. If blood sugars in the 100-175=30 units If greater than 175=50 units.    Marland Kitchen losartan (COZAAR) 100 MG tablet Take 100 mg by mouth daily.    . metFORMIN (GLUCOPHAGE) 500 MG tablet Take 500 mg by mouth 2 (two) times daily with a meal.     . metoprolol (LOPRESSOR) 100 MG tablet Take 100 mg by mouth 2 (two) times daily.       . simvastatin (ZOCOR) 20 MG tablet Take 20 mg by mouth daily.     No current facility-administered medications on file prior to visit.      Objective:   Physical Exam Blood pressure (!) 152/72, pulse 60, temperature 97.7 F (36.5 C), height 5' 8"  (1.727 m), weight 237 lb 14.4 oz (107.9 kg). Alert and oriented. Skin warm and dry. Oral mucosa is moist.   . Sclera anicteric, conjunctivae is pink. Thyroid not enlarged. No cervical lymphadenopathy. Lungs clear. Heart regular rate and rhythm.  Abdomen is soft. Bowel sounds are positive. No hepatomegaly. No abdominal masses felt. No tenderness.  No edema to lower extremities.          Assessment & Plan:  Fatty liver. He is exercising 4 times a week. He is on a low fat diet. His platelet ct is good. Liver enzymes are normal. Will see back in 1 years.

## 2018-05-12 NOTE — Patient Instructions (Addendum)
OV in 1 year.  Labs today

## 2018-05-13 LAB — CBC WITH DIFFERENTIAL/PLATELET
Basophils Absolute: 39 cells/uL (ref 0–200)
Basophils Relative: 0.5 %
EOS ABS: 108 {cells}/uL (ref 15–500)
Eosinophils Relative: 1.4 %
HCT: 39.5 % (ref 38.5–50.0)
HEMOGLOBIN: 12.5 g/dL — AB (ref 13.2–17.1)
Lymphs Abs: 3249 cells/uL (ref 850–3900)
MCH: 23 pg — AB (ref 27.0–33.0)
MCHC: 31.6 g/dL — AB (ref 32.0–36.0)
MCV: 72.6 fL — AB (ref 80.0–100.0)
MONOS PCT: 8.4 %
MPV: 11.5 fL (ref 7.5–12.5)
NEUTROS ABS: 3658 {cells}/uL (ref 1500–7800)
Neutrophils Relative %: 47.5 %
Platelets: 241 10*3/uL (ref 140–400)
RBC: 5.44 10*6/uL (ref 4.20–5.80)
RDW: 16.6 % — ABNORMAL HIGH (ref 11.0–15.0)
Total Lymphocyte: 42.2 %
WBC: 7.7 10*3/uL (ref 3.8–10.8)
WBCMIX: 647 {cells}/uL (ref 200–950)

## 2018-05-13 LAB — COMPREHENSIVE METABOLIC PANEL
AG Ratio: 1.5 (calc) (ref 1.0–2.5)
ALT: 12 U/L (ref 9–46)
AST: 15 U/L (ref 10–35)
Albumin: 4.3 g/dL (ref 3.6–5.1)
Alkaline phosphatase (APISO): 69 U/L (ref 40–115)
BUN: 15 mg/dL (ref 7–25)
CO2: 28 mmol/L (ref 20–32)
CREATININE: 1.08 mg/dL (ref 0.70–1.18)
Calcium: 9.2 mg/dL (ref 8.6–10.3)
Chloride: 102 mmol/L (ref 98–110)
GLUCOSE: 211 mg/dL — AB (ref 65–139)
Globulin: 2.8 g/dL (calc) (ref 1.9–3.7)
Potassium: 3.9 mmol/L (ref 3.5–5.3)
SODIUM: 138 mmol/L (ref 135–146)
TOTAL PROTEIN: 7.1 g/dL (ref 6.1–8.1)
Total Bilirubin: 0.4 mg/dL (ref 0.2–1.2)

## 2018-06-13 DIAGNOSIS — R69 Illness, unspecified: Secondary | ICD-10-CM | POA: Diagnosis not present

## 2018-07-06 DIAGNOSIS — I10 Essential (primary) hypertension: Secondary | ICD-10-CM | POA: Diagnosis not present

## 2018-07-28 DIAGNOSIS — E1165 Type 2 diabetes mellitus with hyperglycemia: Secondary | ICD-10-CM | POA: Diagnosis not present

## 2018-07-28 DIAGNOSIS — I10 Essential (primary) hypertension: Secondary | ICD-10-CM | POA: Diagnosis not present

## 2018-07-28 DIAGNOSIS — S91339A Puncture wound without foreign body, unspecified foot, initial encounter: Secondary | ICD-10-CM | POA: Diagnosis not present

## 2018-08-04 ENCOUNTER — Encounter: Payer: Medicare HMO | Attending: Internal Medicine | Admitting: Nutrition

## 2018-08-04 VITALS — Ht 68.0 in | Wt 238.0 lb

## 2018-08-04 DIAGNOSIS — E1165 Type 2 diabetes mellitus with hyperglycemia: Secondary | ICD-10-CM | POA: Insufficient documentation

## 2018-08-04 DIAGNOSIS — E669 Obesity, unspecified: Secondary | ICD-10-CM | POA: Insufficient documentation

## 2018-08-04 DIAGNOSIS — E118 Type 2 diabetes mellitus with unspecified complications: Secondary | ICD-10-CM | POA: Insufficient documentation

## 2018-08-04 DIAGNOSIS — K760 Fatty (change of) liver, not elsewhere classified: Secondary | ICD-10-CM | POA: Diagnosis not present

## 2018-08-04 DIAGNOSIS — IMO0002 Reserved for concepts with insufficient information to code with codable children: Secondary | ICD-10-CM

## 2018-08-04 NOTE — Progress Notes (Signed)
  Medical Nutrition Therapy:  Appt start time: 0930 end time:  1030.   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 Needing to lose weight and eat healthier to improve his fatty liver.  She does the coooking and shopping in the home. Had DM for 10 yrrs.. Eats 3 meals and 2 - 3 snacks per day. Stays hungry. Drinks a lot of milk. [place] [facility] Dr. Legrand Rams for PCP. FBS 105-170's. Bedtimes 200's usually. Jardiance, and Levermir 50 units.   Current diet is excessive in calories contributing to elevated BS and weigh gain. Vitals with BMI 08/04/2018 05/12/2018  Height 5' 8"  5' 8"   Weight 238 lbs 237 lbs 14 oz  BMI 50.0 37.04  Systolic  888  Diastolic  72  Pulse  60  Respirations     Preferred Learning Style:   No preference indicated   Learning Readiness:   Ready  Change in progress   MEDICATIONS: See list   DIETARY INTAKE:  24-hr recall:  B ( AM): 2 eggs, oatmeal, coffe Snk ( AM):  misc  Chips, or fruit, beans  L ( PM): Vegetables soup, 1-1/2- mushrooms, pumpkin, tomatoes.pasta  And 2 cup of cream of wheat,  Sausage 4 ",  water Snk ( PM):  1/2 bagel,D ( PM):  Snk ( PM): [place] chili 1/2,  With cabbage/carrots 1 c, water Yogurt 1 c,  With raisins, Beverages:water Milk, 16 oz  Or gallon a week.  Usual physical activity: ADL  Estimated energy needs: 1800 calories 200  g carbohydrates 135 g protein 50 g fat  Progress Towards Goal(s):  Some progress.   Nutritional Diagnosis:  NB-1.1 Food and nutrition-related knowledge deficit As related to Diabetes.  As evidenced by  A1C .    Intervention:  Nutrition and Diabetes education provided on My Plate, CHO counting, meal planning, portion sizes, timing of meals, avoiding snacks between meals unless having a low blood sugar, target ranges for A1C and blood sugars, signs/symptoms and treatment of hyper/hypoglycemia, monitoring blood sugars, taking medications as prescribed, benefits of  exercising 30 minutes per day and prevention of complications of DM. Heart Healthy Diet, Lower Fat  Goals 1. Eat three meals on time 2. Cut out snacks Measure foods out Eat 3-4 carb choices per meal  Exercise by walking 30 minutes 3-4 times per week.   Teaching Method Utilized:  Visual Auditory Hands on  Handouts given during visit include:  The Plate Method  Meal Plan Card   Barriers to learning/adherence to lifestyle change: none  Demonstrated degree of understanding via:  Teach Back   Monitoring/Evaluation:  Dietary intake, exercise, , and body weight in 1 month(s).

## 2018-08-04 NOTE — Patient Instructions (Signed)
Goals 1. Eat three meals on time 2. Cut out snacks Measure foods out Eat 3-4 carb choices per meal  Exercise by walking 30 minutes 3-4 times per week.

## 2018-08-10 ENCOUNTER — Encounter: Payer: Self-pay | Admitting: Nutrition

## 2018-08-15 DIAGNOSIS — R69 Illness, unspecified: Secondary | ICD-10-CM | POA: Diagnosis not present

## 2018-08-16 DIAGNOSIS — R69 Illness, unspecified: Secondary | ICD-10-CM | POA: Diagnosis not present

## 2018-09-01 ENCOUNTER — Encounter: Payer: Medicare HMO | Attending: Internal Medicine | Admitting: Nutrition

## 2018-09-01 ENCOUNTER — Encounter: Payer: Self-pay | Admitting: Nutrition

## 2018-09-01 VITALS — Ht 68.0 in | Wt 235.0 lb

## 2018-09-01 DIAGNOSIS — K760 Fatty (change of) liver, not elsewhere classified: Secondary | ICD-10-CM

## 2018-09-01 DIAGNOSIS — E118 Type 2 diabetes mellitus with unspecified complications: Secondary | ICD-10-CM | POA: Insufficient documentation

## 2018-09-01 DIAGNOSIS — E669 Obesity, unspecified: Secondary | ICD-10-CM

## 2018-09-01 DIAGNOSIS — IMO0002 Reserved for concepts with insufficient information to code with codable children: Secondary | ICD-10-CM

## 2018-09-01 DIAGNOSIS — E1165 Type 2 diabetes mellitus with hyperglycemia: Secondary | ICD-10-CM | POA: Diagnosis present

## 2018-09-01 NOTE — Patient Instructions (Signed)
Goals  Go to the [facility] three times per week  Lose 3-4 lbs a month' Aim for BS less than 180 mg/dl before

## 2018-09-01 NOTE — Progress Notes (Signed)
  Medical Nutrition Therapy:  Appt start time: 0930 end time:  1000.   Assessment:  Primary concerns today: Diabetes Type 2 with Fatty Liver..  Lost 3 lbs. Changes made: eating 3 meals per day. Drinking water. Walking some. Levemir 50 units a day. Metformin 850 mg BID. Dr. Legrand Rams PCP. Feels much better.  Cut out snacks.  Avg BS readings 150 mg/dl..  Last A1C 04/2018 was 10.3%.  Scheduled to see Dr. Legrand Rams in April 2020. Willing to start going to [facility] for further weight loss and improved BS control.  Current diet is excessive in calories contributing to elevated BS and weigh gain. Vitals with BMI 08/04/2018 05/12/2018  Height 5' 8"  5' 8"   Weight 238 lbs 237 lbs 14 oz  BMI 72.0 94.70  Systolic  962  Diastolic  72  Pulse  60  Respirations     Preferred Learning Style:   No preference indicated   Learning Readiness:   Ready  Change in progress   MEDICATIONS: See list   DIETARY INTAKE:  24-hr recall:  B ( AM): coffee, oatmeal and fruit, 2 eggmisc  Chips, or fruit, beans  L ( PM): [place], vegetables, spinach, Milk and water  D) Steak, mixed vegetables, water, fruit Snack dark chocolate, Beverages:water  Usual physical activity: walking.   Estimated energy needs: 1800 calories 200  g carbohydrates 135 g protein 50 g fat  Progress Towards Goal(s):  Some progress.   Nutritional Diagnosis:  NB-1.1 Food and nutrition-related knowledge deficit As related to Diabetes.  As evidenced by  A1C .    Intervention:  Nutrition and Diabetes education provided on My Plate, CHO counting, meal planning, portion sizes, timing of meals, avoiding snacks between meals unless having a low blood sugar, target ranges for A1C and blood sugars, signs/symptoms and treatment of hyper/hypoglycemia, monitoring blood sugars, taking medications as prescribed, benefits of exercising 30 minutes per day and prevention of complications of DM. Heart Healthy Diet, Lower Fat  Goals  Go to the [facility] three  times per week  Lose 3-4 lbs a month' Aim for BS less than 180 mg/dl before bed  Teaching Method Utilized:  Visual Auditory Hands on  Handouts given during visit include:  The Plate Method  Meal Plan Card   Barriers to learning/adherence to lifestyle change: none  Demonstrated degree of understanding via:  Teach Back   Monitoring/Evaluation:  Dietary intake, exercise, , and body weight in 3 month(s).

## 2018-10-02 DIAGNOSIS — R69 Illness, unspecified: Secondary | ICD-10-CM | POA: Diagnosis not present

## 2018-10-25 DIAGNOSIS — I10 Essential (primary) hypertension: Secondary | ICD-10-CM | POA: Diagnosis not present

## 2018-10-25 DIAGNOSIS — E1165 Type 2 diabetes mellitus with hyperglycemia: Secondary | ICD-10-CM | POA: Diagnosis not present

## 2018-10-25 DIAGNOSIS — E785 Hyperlipidemia, unspecified: Secondary | ICD-10-CM | POA: Diagnosis not present

## 2018-11-18 DIAGNOSIS — R69 Illness, unspecified: Secondary | ICD-10-CM | POA: Diagnosis not present

## 2018-11-25 DIAGNOSIS — I10 Essential (primary) hypertension: Secondary | ICD-10-CM | POA: Diagnosis not present

## 2018-11-25 DIAGNOSIS — E1165 Type 2 diabetes mellitus with hyperglycemia: Secondary | ICD-10-CM | POA: Diagnosis not present

## 2018-11-25 DIAGNOSIS — R600 Localized edema: Secondary | ICD-10-CM | POA: Diagnosis not present

## 2018-11-28 DIAGNOSIS — I10 Essential (primary) hypertension: Secondary | ICD-10-CM | POA: Diagnosis not present

## 2018-11-28 DIAGNOSIS — R600 Localized edema: Secondary | ICD-10-CM | POA: Diagnosis not present

## 2018-11-28 DIAGNOSIS — E1165 Type 2 diabetes mellitus with hyperglycemia: Secondary | ICD-10-CM | POA: Diagnosis not present

## 2018-12-15 ENCOUNTER — Ambulatory Visit: Payer: Self-pay | Admitting: Nutrition

## 2018-12-20 DIAGNOSIS — I10 Essential (primary) hypertension: Secondary | ICD-10-CM | POA: Diagnosis not present

## 2018-12-20 DIAGNOSIS — E1165 Type 2 diabetes mellitus with hyperglycemia: Secondary | ICD-10-CM | POA: Diagnosis not present

## 2018-12-20 DIAGNOSIS — L209 Atopic dermatitis, unspecified: Secondary | ICD-10-CM | POA: Diagnosis not present

## 2019-04-14 ENCOUNTER — Other Ambulatory Visit: Payer: Self-pay

## 2019-04-14 DIAGNOSIS — Z20822 Contact with and (suspected) exposure to covid-19: Secondary | ICD-10-CM

## 2019-04-15 LAB — NOVEL CORONAVIRUS, NAA: SARS-CoV-2, NAA: NOT DETECTED

## 2019-04-18 ENCOUNTER — Telehealth: Payer: Self-pay | Admitting: General Practice

## 2019-04-18 NOTE — Telephone Encounter (Signed)
Gave patient negative covid test results Patient understood

## 2019-05-15 ENCOUNTER — Ambulatory Visit (INDEPENDENT_AMBULATORY_CARE_PROVIDER_SITE_OTHER): Payer: Medicare HMO | Admitting: Nurse Practitioner

## 2019-05-23 ENCOUNTER — Other Ambulatory Visit: Payer: Self-pay

## 2019-05-23 DIAGNOSIS — Z20822 Contact with and (suspected) exposure to covid-19: Secondary | ICD-10-CM

## 2019-05-24 LAB — NOVEL CORONAVIRUS, NAA: SARS-CoV-2, NAA: NOT DETECTED

## 2019-07-17 ENCOUNTER — Encounter (INDEPENDENT_AMBULATORY_CARE_PROVIDER_SITE_OTHER): Payer: Self-pay | Admitting: Internal Medicine

## 2019-07-17 ENCOUNTER — Encounter (INDEPENDENT_AMBULATORY_CARE_PROVIDER_SITE_OTHER): Payer: Self-pay | Admitting: *Deleted

## 2019-07-17 ENCOUNTER — Other Ambulatory Visit: Payer: Self-pay

## 2019-07-17 ENCOUNTER — Ambulatory Visit (INDEPENDENT_AMBULATORY_CARE_PROVIDER_SITE_OTHER): Payer: Medicare HMO | Admitting: Internal Medicine

## 2019-07-17 DIAGNOSIS — K760 Fatty (change of) liver, not elsewhere classified: Secondary | ICD-10-CM | POA: Diagnosis not present

## 2019-07-17 NOTE — Progress Notes (Signed)
Presenting complaint;  Follow-up for fatty liver.  Subjective and subjective:  Patient is 74 year old Afro-American male who has history of fatty liver.  Elastography back in November 2017 suggested F3 and F4 disease.  He had lab studies in December 2017.  Alpha 1 antitrypsin and ceruloplasmin levels were normal.  Serum iron was 68 saturation was 24% and serum ferritin was 187.  Sed rate was 7 and smooth muscle antibody was negative. Viral markers for hepatitis A, B, and C were negative in May 2018. Patient does not recall if he has had hepatitis a and B vaccination. Patient is advised regular physical activity and up to control of his diabetes.  Patient has no complaints.  He says he has been diabetic for about 11 years.  He feels control has been satisfactory.  He was walking regularly at [place] in [facility] until [place] pandemic started.  He stays busy but does not do any regular physical activity. His appetite is good.  He has not lost any weight since his last visit.  His bowels move daily and he denies melena or rectal bleeding. He has history of colonic adenomas.  Last colonoscopy was in March 2019 with removal of small tubular adenoma from transverse colon. Patient does not drink alcohol.  He states family history is negative for chronic liver disease.   Current Medications: Outpatient Encounter Medications as of 07/17/2019  Medication Sig  . aspirin EC 81 MG tablet Take 1 tablet (81 mg total) by mouth daily.  . empagliflozin (JARDIANCE) 10 MG TABS tablet Take 10 mg by mouth daily.  . hydrochlorothiazide (HYDRODIURIL) 25 MG tablet Take 25 mg by mouth daily.  . insulin detemir (LEVEMIR) 100 UNIT/ML injection Inject 60 Units into the skin at bedtime.   Marland Kitchen losartan (COZAAR) 100 MG tablet Take 100 mg by mouth daily.  . metFORMIN (GLUCOPHAGE) 500 MG tablet Take 500 mg by mouth 2 (two) times daily with a meal.   . metoprolol (LOPRESSOR) 100 MG tablet Take 100 mg by mouth 2  (two) times daily.   Marland Kitchen NOVOLOG FLEXPEN 100 UNIT/ML FlexPen INJECT 10 UNITS SUBCUTANEOUSLY WITH MEALS  . simvastatin (ZOCOR) 20 MG tablet Take 20 mg by mouth daily.  . Insulin Glargine (TOUJEO SOLOSTAR [place]) Inject 30-50 Units into the skin at bedtime. If blood sugars in the 100-175=30 units If greater than 175=50 units.  . [DISCONTINUED] amLODipine (NORVASC) 5 MG tablet Take 5 mg by mouth daily.    No facility-administered encounter medications on file as of 07/17/2019.   Past Medical History:  Diagnosis Date  . Colon polyps   . Diabetes mellitus without complication ([place])   . Fatty liver   . Hypercholesteremia   . Hypertension    Past Surgical History:  Procedure Laterality Date  . COLONOSCOPY    . COLONOSCOPY  04/21/2012   Procedure: COLONOSCOPY;  Surgeon: Rogene Houston, MD;  [facility];  Service: Endoscopy;  Laterality: N/A;  1200  . COLONOSCOPY N/A 09/09/2017   Procedure: COLONOSCOPY;  Surgeon: Rogene Houston, MD;  [facility];  Service: Endoscopy;  Laterality: N/A;  1200-rescheduled to 3/14/ @12 :00pm per Lelon Frohlich  . HERNIA REPAIR    . POLYPECTOMY  09/09/2017   Procedure: POLYPECTOMY;  Surgeon: Rogene Houston, MD;  [facility];  Service: Endoscopy;;  Transverse Colon (CB)      Objective: Blood pressure (!) 143/75, pulse (!) 56, temperature (!) 97 F (36.1 C), temperature source Temporal, height 5' 8"  (  1.727 m), weight 240 lb 8 oz (109.1 kg). Patient is alert and in no acute distress. He is wearing a facial mask. Conjunctiva is pink. Sclera is nonicteric Oropharyngeal mucosa is normal. No neck masses or thyromegaly noted. Cardiac exam with regular rhythm normal S1 and S2. No murmur or gallop noted. Lungs are clear to auscultation. Abdomen is protuberant with horizontal/curved infraumbilical scar.  Abdomen is soft and nontender.  Liver edge is indistinct.  Liver does not appear to be enlarged.  Spleen is nonpalpable. No LE edema or clubbing  noted.  Labs/studies Results:  CBC Latest Ref Rng & Units 05/12/2018 05/14/2017 08/19/2016  WBC 3.8 - 10.8 Thousand/uL 7.7 6.1 6.4  Hemoglobin 13.2 - 17.1 g/dL 12.5(L) 12.5(L) 12.5(L)  Hematocrit 38.5 - 50.0 % 39.5 39.6 37.7(L)  Platelets 140 - 400 Thousand/uL 241 237 285    CMP Latest Ref Rng & Units 05/12/2018 05/14/2017 11/10/2016  Glucose 65 - 139 mg/dL 211(H) - -  BUN 7 - 25 mg/dL 15 - -  Creatinine 0.70 - 1.18 mg/dL 1.08 - -  Sodium 135 - 146 mmol/L 138 - -  Potassium 3.5 - 5.3 mmol/L 3.9 - -  Chloride 98 - 110 mmol/L 102 - -  CO2 20 - 32 mmol/L 28 - -  Calcium 8.6 - 10.3 mg/dL 9.2 - -  Total Protein 6.1 - 8.1 g/dL 7.1 6.7 7.0  Total Bilirubin 0.2 - 1.2 mg/dL 0.4 0.3 0.3  Alkaline Phos 40 - 115 U/L - - 72  AST 10 - 35 U/L 15 12 19   ALT 9 - 46 U/L 12 8(L) 14    Hepatic Function Latest Ref Rng & Units 05/12/2018 05/14/2017 11/10/2016  Total Protein 6.1 - 8.1 g/dL 7.1 6.7 7.0  Albumin 3.6 - 5.1 g/dL - - 4.2  AST 10 - 35 U/L 15 12 19   ALT 9 - 46 U/L 12 8(L) 14  Alk Phosphatase 40 - 115 U/L - - 72  Total Bilirubin 0.2 - 1.2 mg/dL 0.4 0.3 0.3  Bilirubin, Direct 0.0 - 0.2 mg/dL - 0.1 0.1    Last ultrasound was in February 2018 revealing fatty liver and stable left hepatic lobe cyst.  Assessment:  #1.  Fatty liver.  Patient's transaminases have always been normal.  He does not have additional etiology for his liver disease.  Elastography in November 2017 suggested F3 and F4 disease.  He does not have stigmata of chronic liver disease or cirrhosis. His condition needs to be reevaluated.  If he does have cirrhosis then he needs to be screened on regular basis for [facility]. He also needs to get hepatitis A and B vaccination unless he has had it by Dr. Legrand Rams.  #2.  History of colonic adenomas.  He is up-to-date on surveillance colonoscopy schedule.  Last exam was in March 2019 and next exam would be due in March 2026.  #3.  Mild anemia.  Mild anemia possibly due to chronic  disease.  Plan:  Once again patient reminded to do regular physical activity such as brisk walking if he is able to.  Goal is for him to do at least 2 hours/week. Request copy of recent lab from Dr. Josephine Cables office. Schedule abdominal ultrasound with elastography. Office visit in 1 year.

## 2019-07-17 NOTE — Patient Instructions (Signed)
Will request copy of recent blood work from Dr. Josephine Cables office. Physician will call with results of ultrasound and elastography.

## 2019-07-20 ENCOUNTER — Ambulatory Visit (HOSPITAL_COMMUNITY)
Admission: RE | Admit: 2019-07-20 | Discharge: 2019-07-20 | Disposition: A | Discharge status: Home / Self Care | Payer: Medicare HMO | Source: Ambulatory Visit | Attending: Internal Medicine | Admitting: Internal Medicine

## 2019-07-20 ENCOUNTER — Other Ambulatory Visit: Payer: Self-pay

## 2019-07-20 DIAGNOSIS — K760 Fatty (change of) liver, not elsewhere classified: Secondary | ICD-10-CM | POA: Insufficient documentation

## 2019-08-20 ENCOUNTER — Ambulatory Visit: Payer: Medicare HMO | Attending: Internal Medicine

## 2019-08-20 ENCOUNTER — Other Ambulatory Visit: Payer: Self-pay

## 2019-08-20 DIAGNOSIS — Z23 Encounter for immunization: Secondary | ICD-10-CM

## 2019-08-20 NOTE — Progress Notes (Signed)
   Covid-19 Vaccination Clinic  Name:  Gerald Evans    MRN: [id number] DOB: 1945-10-31  08/20/2019  Gerald Evans was observed post Covid-19 immunization for 15 minutes without incidence. He was provided with Vaccine Information Sheet and instruction to access the V-Safe system.   Gerald Evans was instructed to call 911 with any severe reactions post vaccine: Marland Kitchen Difficulty breathing  . Swelling of your face and throat  . A fast heartbeat  . A bad rash all over your body  . Dizziness and weakness    Immunizations Administered    Name Date Dose VIS Date Route   Pfizer COVID-19 Vaccine 08/20/2019 11:12 AM 0.3 mL 06/09/2019 Intramuscular   Manufacturer: [place]   Lot: [id number]   [place]: [id number]

## 2019-09-12 ENCOUNTER — Ambulatory Visit: Payer: Medicare HMO | Attending: Internal Medicine

## 2019-09-12 DIAGNOSIS — Z23 Encounter for immunization: Secondary | ICD-10-CM

## 2019-09-12 NOTE — Progress Notes (Signed)
   Covid-19 Vaccination Clinic  Name:  BENJIMEN KELLEY    MRN: [id number] DOB: 31-Mar-1946  09/12/2019  Mr. Pereyra was observed post Covid-19 immunization for 15 minutes without incident. He was provided with Vaccine Information Sheet and instruction to access the V-Safe system.   Mr. Duffus was instructed to call 911 with any severe reactions post vaccine: Marland Kitchen Difficulty breathing  . Swelling of face and throat  . A fast heartbeat  . A bad rash all over body  . Dizziness and weakness   Immunizations Administered    Name Date Dose VIS Date Route   Pfizer COVID-19 Vaccine 09/12/2019 11:22 AM 0.3 mL 06/09/2019 Intramuscular   Manufacturer: [place]   Lot: [id number]   [place]: [id number]

## 2019-10-10 LAB — HEMOGLOBIN A1C: Hemoglobin A1C: 8.5

## 2019-11-21 ENCOUNTER — Ambulatory Visit (INDEPENDENT_AMBULATORY_CARE_PROVIDER_SITE_OTHER): Payer: Medicare HMO | Admitting: Nurse Practitioner

## 2019-11-22 ENCOUNTER — Other Ambulatory Visit: Payer: Self-pay

## 2019-11-22 ENCOUNTER — Ambulatory Visit (INDEPENDENT_AMBULATORY_CARE_PROVIDER_SITE_OTHER): Payer: Medicare HMO | Admitting: "Endocrinology

## 2019-11-22 ENCOUNTER — Encounter: Payer: Self-pay | Admitting: "Endocrinology

## 2019-11-22 VITALS — BP 167/93 | HR 55 | Ht 68.0 in | Wt 248.6 lb

## 2019-11-22 DIAGNOSIS — E1165 Type 2 diabetes mellitus with hyperglycemia: Secondary | ICD-10-CM | POA: Diagnosis not present

## 2019-11-22 MED ORDER — ACCU-CHEK GUIDE W/DEVICE KIT: 1.0000 | PACK | 0 refills | Status: DC

## 2019-11-22 MED ORDER — ACCU-CHEK GUIDE VI STRP: ORAL_STRIP | 2 refills | Status: DC

## 2019-11-22 MED ORDER — LEVEMIR FLEXTOUCH 100 UNIT/ML ~~LOC~~ SOPN: 60.0000 [IU] | PEN_INJECTOR | Freq: Every day | SUBCUTANEOUS | 2 refills | Status: DC

## 2019-11-22 NOTE — Progress Notes (Signed)
Endocrinology Consult Note       11/22/2019, 12:47 PM   Subjective:    Patient ID: Gerald Evans, male    DOB: [birth year].  Gerald Evans is being seen in consultation for management of currently uncontrolled symptomatic diabetes requested by  Rosita Fire, MD.   Past Medical History:  Diagnosis Date  . Colon polyps   . Diabetes mellitus without complication ([place])   . Fatty liver   . Hypercholesteremia   . Hypertension     Past Surgical History:  Procedure Laterality Date  . COLONOSCOPY    . COLONOSCOPY  04/21/2012   Procedure: COLONOSCOPY;  Surgeon: Rogene Houston, MD;  [facility];  Service: Endoscopy;  Laterality: N/A;  1200  . COLONOSCOPY N/A 09/09/2017   Procedure: COLONOSCOPY;  Surgeon: Rogene Houston, MD;  [facility];  Service: Endoscopy;  Laterality: N/A;  1200-rescheduled to 3/14/ @12 :00pm per Lelon Frohlich  . HERNIA REPAIR    . POLYPECTOMY  09/09/2017   Procedure: POLYPECTOMY;  Surgeon: Rogene Houston, MD;  [facility];  Service: Endoscopy;;  Transverse Colon (CB)    Social History   Socioeconomic History  . Marital status: Married    Spouse name: Not on file  . Number of children: Not on file  . Years of education: Not on file  . Highest education level: Not on file  Occupational History  . Not on file  Tobacco Use  . Smoking status: Former Smoker    Packs/day: 0.50    Years: 20.00    Pack years: 10.00    Types: Cigarettes  . Smokeless tobacco: Never Used  Substance and Sexual Activity  . Alcohol use: Yes    Alcohol/week: 4.0 standard drinks    Types: 4 Standard drinks or equivalent per week    Comment: Patient states that he drinks on weekends, has ben dry for 2 weeks per patient  . Drug use: No  . Sexual activity: Not on file  Other Topics Concern  . Not on file  Social History Narrative  . Not on file   Social Determinants of  Health   Financial Resource Strain:   . Difficulty of Paying Living Expenses:   Food Insecurity:   . Worried About Charity fundraiser in the Last Year:   . Arboriculturist in the Last Year:   Transportation Needs:   . Film/video editor (Medical):   Marland Kitchen Lack of Transportation (Non-Medical):   Physical Activity:   . Days of Exercise per Week:   . Minutes of Exercise per Session:   Stress:   . Feeling of Stress :   Social Connections:   . Frequency of Communication with Friends and Family:   . Frequency of Social Gatherings with Friends and Family:   . Attends Religious Services:   . Active Member of Clubs or Organizations:   . Attends Archivist Meetings:   Marland Kitchen Marital Status:     Family History  Problem Relation Age of Onset  . Hypertension Mother   . Hypertension Father   . Diabetes Sister   .  Hypertension Sister   . Healthy Son   . Healthy Daughter   . Colon cancer Neg Hx     Outpatient Encounter Medications as of 11/22/2019  Medication Sig  . hydrALAZINE (APRESOLINE) 50 MG tablet Take 50 mg by mouth daily.   . [DISCONTINUED] spironolactone (ALDACTONE) 25 MG tablet Take 25 mg by mouth 2 (two) times daily.  Marland Kitchen aspirin EC 81 MG tablet Take 1 tablet (81 mg total) by mouth daily.  . Blood Glucose Monitoring Suppl (ACCU-CHEK GUIDE) w/Device KIT 1 Piece by Does not apply route as directed.  Marland Kitchen glucose blood (ACCU-CHEK GUIDE) test strip Use as instructed  . hydrochlorothiazide (HYDRODIURIL) 25 MG tablet Take 25 mg by mouth daily.  . insulin detemir (LEVEMIR FLEXTOUCH) 100 UNIT/ML FlexPen Inject 60 Units into the skin daily.  Marland Kitchen losartan (COZAAR) 100 MG tablet Take 100 mg by mouth daily.  . metFORMIN (GLUCOPHAGE) 1000 MG tablet Take 1,000 mg by mouth 2 (two) times daily.  . metoprolol (LOPRESSOR) 100 MG tablet Take 100 mg by mouth 2 (two) times daily.   Marland Kitchen NOVOLOG FLEXPEN 100 UNIT/ML FlexPen Inject 6-12 Units into the skin 3 (three) times daily before meals.  .  sildenafil (VIAGRA) 100 MG tablet Take 100 mg by mouth daily as needed.  . simvastatin (ZOCOR) 20 MG tablet Take 20 mg by mouth daily.  . [DISCONTINUED] empagliflozin (JARDIANCE) 10 MG TABS tablet Take 10 mg by mouth daily.  . [DISCONTINUED] insulin detemir (LEVEMIR) 100 UNIT/ML injection Inject 60 Units into the skin at bedtime.   . [DISCONTINUED] metFORMIN (GLUCOPHAGE) 500 MG tablet Take 500 mg by mouth 2 (two) times daily with a meal.    No facility-administered encounter medications on file as of 11/22/2019.    ALLERGIES: Allergies  Allergen Reactions  . Lisinopril     cough    VACCINATION STATUS: Immunization History  Administered Date(s) Administered  . PFIZER SARS-COV-2 Vaccination 08/20/2019, 09/12/2019    Diabetes He presents for his initial diabetic visit. He has type 2 diabetes mellitus. Onset time: He was diagnosed at approximate age of 54 years. His disease course has been worsening. There are no hypoglycemic associated symptoms. Pertinent negatives for hypoglycemia include no confusion, headaches, pallor or seizures. Associated symptoms include polydipsia and polyuria. Pertinent negatives for diabetes include no chest pain, no fatigue, no polyphagia and no weakness. There are no hypoglycemic complications. Symptoms are worsening. Diabetic complications include impotence. Risk factors for coronary artery disease include dyslipidemia, diabetes mellitus, family history, tobacco exposure, sedentary lifestyle, male sex, obesity and hypertension. Current diabetic treatment includes insulin injections (Is currently on Levemir 60 units nightly, NovoLog 10 units 3 times daily AC, Jardiance 10 mg daily, Metformin 500 mg p.o. twice daily.). His weight is fluctuating minimally. He is following a generally unhealthy diet. When asked about meal planning, he reported none. He has not had a previous visit with a dietitian. He participates in exercise intermittently. (He did not bring any logs nor  meter to review.  His recent A1c was 8.5% on October 10, 2019.) An ACE inhibitor/angiotensin II receptor blocker is being taken. Eye exam is current.  Hyperlipidemia This is a chronic problem. The current episode started more than 1 year ago. Exacerbating diseases include diabetes and obesity. Pertinent negatives include no chest pain, myalgias or shortness of breath. Current antihyperlipidemic treatment includes statins. Risk factors for coronary artery disease include diabetes mellitus, dyslipidemia, hypertension, male sex, obesity, a sedentary lifestyle and family history.  Hypertension This is a chronic problem.  The current episode started more than 1 year ago. Pertinent negatives include no chest pain, headaches, neck pain, palpitations or shortness of breath. Risk factors for coronary artery disease include dyslipidemia, diabetes mellitus, male gender, obesity and smoking/tobacco exposure. Past treatments include angiotensin blockers.     Review of Systems  Constitutional: Negative for chills, fatigue, fever and unexpected weight change.  HENT: Negative for dental problem, mouth sores and trouble swallowing.   Eyes: Negative for visual disturbance.  Respiratory: Negative for cough, choking, chest tightness, shortness of breath and wheezing.   Cardiovascular: Negative for chest pain, palpitations and leg swelling.  Gastrointestinal: Negative for abdominal distention, abdominal pain, constipation, diarrhea, nausea and vomiting.  Endocrine: Positive for polydipsia and polyuria. Negative for polyphagia.  Genitourinary: Positive for impotence. Negative for dysuria, flank pain, hematuria and urgency.  Musculoskeletal: Negative for back pain, gait problem, myalgias and neck pain.  Skin: Negative for pallor, rash and wound.  Neurological: Negative for seizures, syncope, weakness, numbness and headaches.  Psychiatric/Behavioral: Negative for confusion and dysphoric mood.    Objective:    Vitals  with BMI 11/22/2019 07/17/2019 09/01/2018  Height 5' 8"  5' 8"  5' 8"   Weight 248 lbs 10 oz 240 lbs 8 oz 235 lbs  BMI 37.81 76.16 07.37  Systolic 106 269 -  Diastolic 93 75 -  Pulse 55 56 -    BP (!) 167/93   Pulse (!) 55   Ht 5' 8"  (1.727 m)   Wt 248 lb 9.6 oz (112.8 kg)   BMI 37.80 kg/m   Wt Readings from Last 3 Encounters:  11/22/19 248 lb 9.6 oz (112.8 kg)  07/17/19 240 lb 8 oz (109.1 kg)  09/01/18 235 lb (106.6 kg)     Physical Exam Constitutional:      General: He is not in acute distress.    Appearance: He is well-developed.  HENT:     Head: Normocephalic and atraumatic.  Neck:     Thyroid: No thyromegaly.     Trachea: No tracheal deviation.  Cardiovascular:     Rate and Rhythm: Normal rate.     Pulses:          Dorsalis pedis pulses are 1+ on the right side and 1+ on the left side.       Posterior tibial pulses are 1+ on the right side and 1+ on the left side.     Heart sounds: Normal heart sounds, S1 normal and S2 normal. No murmur. No gallop.   Pulmonary:     Effort: No respiratory distress.     Breath sounds: Normal breath sounds. No wheezing.  Abdominal:     General: Bowel sounds are normal. There is no distension.     Palpations: Abdomen is soft.     Tenderness: There is no abdominal tenderness. There is no guarding.  Musculoskeletal:     Right shoulder: No swelling or deformity.     Cervical back: Normal range of motion and neck supple.  Skin:    General: Skin is warm and dry.     Findings: No rash.     Nails: There is no clubbing.  Neurological:     Mental Status: He is alert and oriented to person, place, and time.     Cranial Nerves: No cranial nerve deficit.     Sensory: No sensory deficit.     Gait: Gait normal.     Deep Tendon Reflexes: Reflexes are normal and symmetric.  Psychiatric:  Speech: Speech normal.        Behavior: Behavior normal. Behavior is cooperative.        Thought Content: Thought content normal.        Judgment:  Judgment normal.      CMP ( most recent) CMP     Component Value Date/Time   NA 138 05/12/2018 1137   K 3.9 05/12/2018 1137   CL 102 05/12/2018 1137   CO2 28 05/12/2018 1137   GLUCOSE 211 (H) 05/12/2018 1137   BUN 15 05/12/2018 1137   CREATININE 1.08 05/12/2018 1137   CALCIUM 9.2 05/12/2018 1137   PROT 7.1 05/12/2018 1137   ALBUMIN 4.2 11/10/2016 0937   AST 15 05/12/2018 1137   ALT 12 05/12/2018 1137   ALKPHOS 72 11/10/2016 0937   BILITOT 0.4 05/12/2018 1137   GFRNONAA >60 08/19/2016 1508   GFRAA >60 08/19/2016 1508     Diabetic Labs (most recent): Lab Results  Component Value Date   HGBA1C 8.5 10/10/2019     Assessment & Plan:   1. Uncontrolled type 2 diabetes mellitus with hyperglycemia ([place])  - BEDFORD WINSOR has currently uncontrolled symptomatic type 2 DM since  74 years of age,  with most recent A1c of 8.5 %. Recent labs reviewed. - I had a long discussion with him about the progressive nature of diabetes and the pathology behind its complications. -his diabetes is complicated by obesity/sedentary life and he remains at a high risk for more acute and chronic complications which include CAD, CVA, CKD, retinopathy, and neuropathy. These are all discussed in detail with him.  - I have counseled him on diet  and weight management  by adopting a carbohydrate restricted/protein rich diet. Patient is encouraged to switch to  unprocessed or minimally processed     complex starch and increased protein intake (animal or plant source), fruits, and vegetables. -  he is advised to stick to a routine mealtimes to eat 3 meals  a day and avoid unnecessary snacks ( to snack only to correct hypoglycemia).   - he admits that there is a room for improvement in his food and drink choices. - Suggestion is made for him to avoid simple carbohydrates  from his diet including Cakes, Sweet Desserts, Ice Cream, Soda (diet and regular), Sweet Tea, Candies, Chips, Cookies, Store Bought  Juices, Alcohol in Excess of  1-2 drinks a day, Artificial Sweeteners,  Coffee Creamer, and "Sugar-free" Products. This will help patient to have more stable blood glucose profile and potentially avoid unintended weight gain.  - he will be scheduled with Jearld Fenton, RDN, CDE for diabetes education.  - I have approached him with the following individualized plan to manage  his diabetes and patient agrees:   - he will continue to require insulin treatment in order for him to achieve and maintain control of diabetes to target.   -Accordingly, he is advised to continue Levemir 60 units nightly, lower NovoLog to 6 units 3 times a day with meals  for pre-meal BG readings of 90-144m/dl, plus patient specific correction dose for unexpected hyperglycemia above 1534mdl, associated with strict monitoring of glucose 4 times a day-before meals and at bedtime. - he is warned not to take insulin without proper monitoring per orders. - Adjustment parameters are given to him for hypo and hyperglycemia in writing. - he is encouraged to call clinic for blood glucose levels less than 70 or above 300 mg /dl. - he is advised to continue Metformin 500  mg p.o. twice daily, therapeutically suitable for patient . - his  Vania Rea will be discontinued, risk outweighs benefit for this patient.  He will be considered for incretin therapy as needed on subsequent visits. - Specific targets for  A1c;  LDL, HDL,  and Triglycerides were discussed with the patient.  2) Blood Pressure /Hypertension:  his blood pressure is  not controlled to target.   he is advised to continue his current medications including hydralazine 50 mg p.o. daily, hydrochlorothiazide 25 mg daily, losartan 100 mg p.o. daily, metoprolol 100 mg p.o. twice daily .   3) Lipids/Hyperlipidemia: No recent lipid panel to review.  He is advised to continue simvastatin 20 mg p.o. nightly.  Side effects and precautions discussed with him.   4)  Weight/Diet:   Body mass index is 37.8 kg/m.  -   clearly complicating his diabetes care.   he is  a candidate for weight loss. I discussed with him the fact that loss of 5 - 10% of his  current body weight will have the most impact on his diabetes management.  Exercise, and detailed carbohydrates information provided  -  detailed on discharge instructions.  5) Chronic Care/Health Maintenance:  -he  is on ACEI/ARB and Statin medications and  is encouraged to initiate and continue to follow up with Ophthalmology, Dentist,  Podiatrist at least yearly or according to recommendations, and advised to  stay away from smoking. I have recommended yearly flu vaccine and pneumonia vaccine at least every 5 years; moderate intensity exercise for up to 150 minutes weekly; and  sleep for at least 7 hours a day.  - he is  advised to maintain close follow up with Rosita Fire, MD for primary care needs, as well as his other providers for optimal and coordinated care.   - Time spent in this patient care: 60 min, of which > 50% was spent in  counseling  him about his currently uncontrolled, complicated type 2 diabetes, hyperlipidemia, hypertension and the rest reviewing his blood glucose logs , discussing his hypoglycemia and hyperglycemia episodes, reviewing his current and  previous labs / studies  ( including abstraction from other facilities) and medications  doses and developing a  long term treatment plan based on the latest standards of care/ guidelines; and documenting his care.    Please refer to Patient Instructions for Blood Glucose Monitoring and Insulin/Medications Dosing Guide"  in media tab for additional information. Please  also refer to " Patient Self Inventory" in the Media  tab for reviewed elements of pertinent patient history.  Gerald Evans participated in the discussions, expressed understanding, and voiced agreement with the above plans.  All questions were answered to his satisfaction. he is encouraged to  contact clinic should he have any questions or concerns prior to his return visit.   Follow up plan: - Return in about 2 weeks (around 12/06/2019) for F/U with Meter and Logs Only - no Labs.  Glade Lloyd, MD [facility] [address] [place], [place] [address] Phone: [phone]  Fax: [phone]    11/22/2019, 12:47 PM  This note was partially dictated with voice recognition software. Similar sounding words can be transcribed inadequately or may not  be corrected upon review.

## 2019-11-22 NOTE — Patient Instructions (Signed)

## 2019-11-23 ENCOUNTER — Other Ambulatory Visit: Payer: Self-pay

## 2019-11-23 MED ORDER — ACCU-CHEK GUIDE VI STRP: ORAL_STRIP | 2 refills | Status: DC

## 2019-11-29 ENCOUNTER — Other Ambulatory Visit: Payer: Self-pay

## 2019-11-29 ENCOUNTER — Other Ambulatory Visit: Payer: Self-pay | Admitting: "Endocrinology

## 2019-11-29 MED ORDER — BLOOD GLUCOSE METER KIT: PACK | 0 refills | Status: AC

## 2019-11-29 MED ORDER — BLOOD GLUCOSE METER KIT: PACK | 5 refills | Status: DC

## 2019-12-19 ENCOUNTER — Other Ambulatory Visit: Payer: Self-pay

## 2019-12-19 ENCOUNTER — Encounter: Payer: Self-pay | Admitting: "Endocrinology

## 2019-12-19 ENCOUNTER — Ambulatory Visit (INDEPENDENT_AMBULATORY_CARE_PROVIDER_SITE_OTHER): Payer: Medicare HMO | Admitting: "Endocrinology

## 2019-12-19 VITALS — BP 171/78 | HR 55 | Ht 68.0 in | Wt 246.0 lb

## 2019-12-19 DIAGNOSIS — I10 Essential (primary) hypertension: Secondary | ICD-10-CM

## 2019-12-19 DIAGNOSIS — E1165 Type 2 diabetes mellitus with hyperglycemia: Secondary | ICD-10-CM | POA: Diagnosis not present

## 2019-12-19 DIAGNOSIS — E782 Mixed hyperlipidemia: Secondary | ICD-10-CM

## 2019-12-19 MED ORDER — CLONIDINE HCL 0.2 MG PO TABS: 0.2000 mg | ORAL_TABLET | Freq: Two times a day (BID) | ORAL | 3 refills | Status: DC

## 2019-12-19 MED ORDER — LEVEMIR FLEXTOUCH 100 UNIT/ML ~~LOC~~ SOPN: 50.0000 [IU] | PEN_INJECTOR | Freq: Every day | SUBCUTANEOUS | 2 refills | Status: DC

## 2019-12-19 NOTE — Patient Instructions (Signed)

## 2019-12-19 NOTE — Progress Notes (Signed)
12/19/2019, 5:18 PM  Endocrinology follow-up note   Subjective:    Patient ID: Gerald Evans, male    DOB: 1945-08-26.  [name] is being seen in follow-up after he was seen in consultation for management of currently uncontrolled symptomatic diabetes requested by  Rosita Fire, MD.   Past Medical History:  Diagnosis Date  . Colon polyps   . Diabetes mellitus without complication ([place])   . Fatty liver   . Hypercholesteremia   . Hypertension     Past Surgical History:  Procedure Laterality Date  . COLONOSCOPY    . COLONOSCOPY  04/21/2012   Procedure: COLONOSCOPY;  Surgeon: Rogene Houston, MD;  [facility];  Service: Endoscopy;  Laterality: N/A;  1200  . COLONOSCOPY N/A 09/09/2017   Procedure: COLONOSCOPY;  Surgeon: Rogene Houston, MD;  [facility]: [facility];  Service: Endoscopy;  Laterality: N/A;  1200-rescheduled to 3/14/ @12 :00pm per Lelon Frohlich  . HERNIA REPAIR    . POLYPECTOMY  09/09/2017   Procedure: POLYPECTOMY;  Surgeon: Rogene Houston, MD;  Location: [facility];  Service: Endoscopy;;  Transverse Colon (CB)    Social History   Socioeconomic History  . Marital status: Married    Spouse name: Not on file  . Number of children: Not on file  . Years of education: Not on file  . Highest education level: Not on file  Occupational History  . Not on file  Tobacco Use  . Smoking status: Former Smoker    Packs/day: 0.50    Years: 20.00    Pack years: 10.00    Types: Cigarettes  . Smokeless tobacco: Never Used  Vaping Use  . Vaping Use: Never used  Substance and Sexual Activity  . Alcohol use: Yes    Alcohol/week: 4.0 standard drinks    Types: 4 Standard drinks or equivalent per week    Comment: Patient states that he drinks on weekends, has ben dry for 2 weeks per patient  . Drug use: No  . Sexual activity: Not on file  Other Topics Concern  . Not on file   Social History Narrative  . Not on file   Social Determinants of Health   Financial Resource Strain:   . Difficulty of Paying Living Expenses:   Food Insecurity:   . Worried About Charity fundraiser in the Last Year:   . Arboriculturist in the Last Year:   Transportation Needs:   . Film/video editor (Medical):   Marland Kitchen Lack of Transportation (Non-Medical):   Physical Activity:   . Days of Exercise per Week:   . Minutes of Exercise per Session:   Stress:   . Feeling of Stress :   Social Connections:   . Frequency of Communication with Friends and Family:   . Frequency of Social Gatherings with Friends and Family:   . Attends Religious Services:   . Active Member of Clubs or Organizations:   . Attends Archivist Meetings:   Marland Kitchen Marital Status:     Family History  Problem Relation Age of Onset  . Hypertension Mother   . Hypertension  Father   . Diabetes Sister   . Hypertension Sister   . Healthy Son   . Healthy Daughter   . Colon cancer Neg Hx     Outpatient Encounter Medications as of 12/19/2019  Medication Sig  . aspirin EC 81 MG tablet Take 1 tablet (81 mg total) by mouth daily.  . blood glucose meter kit and supplies Dispense based on patient and insurance preference. Use up to four times daily as directed. (FOR ICD-10 E11.65)  . Blood Glucose Monitoring Suppl (ACCU-CHEK GUIDE) w/Device KIT 1 Piece by Does not apply route as directed.  . cloNIDine (CATAPRES) 0.2 MG tablet Take 1 tablet (0.2 mg total) by mouth 2 (two) times daily.  Marland Kitchen glucose blood (ACCU-CHEK GUIDE) test strip Use as instructed to check blood glucose four times daily.  Marland Kitchen glucose blood test strip 1 each by Other route in the morning, at noon, in the evening, and at bedtime. Use as instructed qid. One touch ultra blue 2. E11.65  . hydrochlorothiazide (HYDRODIURIL) 25 MG tablet Take 25 mg by mouth daily.  . insulin detemir (LEVEMIR FLEXTOUCH) 100 UNIT/ML FlexPen Inject 50 Units into the skin at  bedtime.  Marland Kitchen losartan (COZAAR) 100 MG tablet Take 100 mg by mouth daily.  . metFORMIN (GLUCOPHAGE) 1000 MG tablet Take 1,000 mg by mouth 2 (two) times daily.  . metoprolol (LOPRESSOR) 100 MG tablet Take 100 mg by mouth 2 (two) times daily.   Marland Kitchen NOVOLOG FLEXPEN 100 UNIT/ML FlexPen Inject 5-11 Units into the skin 3 (three) times daily before meals.  . sildenafil (VIAGRA) 100 MG tablet Take 100 mg by mouth daily as needed.  . simvastatin (ZOCOR) 20 MG tablet Take 20 mg by mouth daily.  . [DISCONTINUED] hydrALAZINE (APRESOLINE) 50 MG tablet Take 50 mg by mouth daily.   . [DISCONTINUED] insulin detemir (LEVEMIR FLEXTOUCH) 100 UNIT/ML FlexPen Inject 60 Units into the skin daily.   No facility-administered encounter medications on file as of 12/19/2019.    ALLERGIES: Allergies  Allergen Reactions  . Lisinopril     cough    VACCINATION STATUS: Immunization History  Administered Date(s) Administered  . PFIZER SARS-COV-2 Vaccination 08/20/2019, 09/12/2019    Diabetes He presents for his follow-up diabetic visit. He has type 2 diabetes mellitus. Onset time: He was diagnosed at approximate age of 59 years. His disease course has been improving. There are no hypoglycemic associated symptoms. Pertinent negatives for hypoglycemia include no confusion, headaches, pallor or seizures. Associated symptoms include polydipsia and polyuria. Pertinent negatives for diabetes include no chest pain, no fatigue, no polyphagia and no weakness. There are no hypoglycemic complications. Symptoms are improving. Diabetic complications include impotence. Risk factors for coronary artery disease include dyslipidemia, diabetes mellitus, family history, tobacco exposure, sedentary lifestyle, male sex, obesity and hypertension. Current diabetic treatment includes insulin injections (Is currently on Levemir 60 units nightly, NovoLog 10 units 3 times daily AC, Jardiance 10 mg daily, Metformin 500 mg p.o. twice daily.). His weight  is stable. He is following a generally unhealthy diet. When asked about meal planning, he reported none. He has not had a previous visit with a dietitian. He participates in exercise intermittently. His home blood glucose trend is decreasing steadily. His breakfast blood glucose range is generally 130-140 mg/dl. His lunch blood glucose range is generally 130-140 mg/dl. His dinner blood glucose range is generally 130-140 mg/dl. His bedtime blood glucose range is generally 130-140 mg/dl. His overall blood glucose range is 130-140 mg/dl. (He presents with significantly improved glycemic  profile, averaging 157 over the last 14 days.  His recent A1c was 8.5% on October 10, 2019.   ) An ACE inhibitor/angiotensin II receptor blocker is being taken. Eye exam is current.  Hyperlipidemia This is a chronic problem. The current episode started more than 1 year ago. Exacerbating diseases include diabetes and obesity. Pertinent negatives include no chest pain, myalgias or shortness of breath. Current antihyperlipidemic treatment includes statins. Risk factors for coronary artery disease include diabetes mellitus, dyslipidemia, hypertension, male sex, obesity, a sedentary lifestyle and family history.  Hypertension This is a chronic problem. The current episode started more than 1 year ago. Pertinent negatives include no chest pain, headaches, neck pain, palpitations or shortness of breath. Risk factors for coronary artery disease include dyslipidemia, diabetes mellitus, male gender, obesity and smoking/tobacco exposure. Past treatments include angiotensin blockers.    Review of Systems  Constitutional: Negative for chills, fatigue, fever and unexpected weight change.  HENT: Negative for dental problem, mouth sores and trouble swallowing.   Eyes: Negative for visual disturbance.  Respiratory: Negative for cough, choking, chest tightness, shortness of breath and wheezing.   Cardiovascular: Negative for chest pain,  palpitations and leg swelling.  Gastrointestinal: Negative for abdominal distention, abdominal pain, constipation, diarrhea, nausea and vomiting.  Endocrine: Positive for polydipsia and polyuria. Negative for polyphagia.  Genitourinary: Positive for impotence. Negative for dysuria, flank pain, hematuria and urgency.  Musculoskeletal: Negative for back pain, gait problem, myalgias and neck pain.  Skin: Negative for pallor, rash and wound.  Neurological: Negative for seizures, syncope, weakness, numbness and headaches.  Psychiatric/Behavioral: Negative for confusion and dysphoric mood.    Objective:    Vitals with BMI 12/19/2019 11/22/2019 07/17/2019  Height 5' 8"  5' 8"  5' 8"   Weight 246 lbs 248 lbs 10 oz 240 lbs 8 oz  BMI 37.41 90.24 09.73  Systolic 532 992 426  Diastolic 78 93 75  Pulse 55 55 56    BP (!) 171/78   Pulse (!) 55   Ht 5' 8"  (1.727 m)   Wt 246 lb (111.6 kg)   BMI 37.40 kg/m   Wt Readings from Last 3 Encounters:  12/19/19 246 lb (111.6 kg)  11/22/19 248 lb 9.6 oz (112.8 kg)  07/17/19 240 lb 8 oz (109.1 kg)     Physical Exam Constitutional:      General: He is not in acute distress.    Appearance: He is well-developed.  HENT:     Head: Normocephalic and atraumatic.  Neck:     Thyroid: No thyromegaly.     Trachea: No tracheal deviation.  Cardiovascular:     Rate and Rhythm: Normal rate.     Pulses:          Dorsalis pedis pulses are 1+ on the right side and 1+ on the left side.       Posterior tibial pulses are 1+ on the right side and 1+ on the left side.     Heart sounds: Normal heart sounds, S1 normal and S2 normal. No murmur heard.  No gallop.   Pulmonary:     Effort: No respiratory distress.     Breath sounds: Normal breath sounds. No wheezing.  Abdominal:     General: Bowel sounds are normal. There is no distension.     Palpations: Abdomen is soft.     Tenderness: There is no abdominal tenderness. There is no guarding.  Musculoskeletal:     Right  shoulder: No swelling or deformity.  Cervical back: Normal range of motion and neck supple.  Skin:    General: Skin is warm and dry.     Findings: No rash.     Nails: There is no clubbing.  Neurological:     Mental Status: He is alert and oriented to person, place, and time.     Cranial Nerves: No cranial nerve deficit.     Sensory: No sensory deficit.     Gait: Gait normal.     Deep Tendon Reflexes: Reflexes are normal and symmetric.  Psychiatric:        Speech: Speech normal.        Behavior: Behavior normal. Behavior is cooperative.        Thought Content: Thought content normal.        Judgment: Judgment normal.      CMP ( most recent) CMP     Component Value Date/Time   NA 138 05/12/2018 1137   K 3.9 05/12/2018 1137   CL 102 05/12/2018 1137   CO2 28 05/12/2018 1137   GLUCOSE 211 (H) 05/12/2018 1137   BUN 15 05/12/2018 1137   CREATININE 1.08 05/12/2018 1137   CALCIUM 9.2 05/12/2018 1137   PROT 7.1 05/12/2018 1137   ALBUMIN 4.2 11/10/2016 0937   AST 15 05/12/2018 1137   ALT 12 05/12/2018 1137   ALKPHOS 72 11/10/2016 0937   BILITOT 0.4 05/12/2018 1137   GFRNONAA >60 08/19/2016 1508   GFRAA >60 08/19/2016 1508    Diabetic Labs (most recent): Lab Results  Component Value Date   HGBA1C 8.5 10/10/2019     Assessment & Plan:   1. Uncontrolled type 2 diabetes mellitus with hyperglycemia ([place])  - SYLVIA KONDRACKI has currently uncontrolled symptomatic type 2 DM since  74 years of age.  He presents with significantly improved glycemic profile, averaging 157 over the last 14 days.  His recent A1c was 8.5% on October 10, 2019.     Recent labs reviewed.  - I had a long discussion with him about the progressive nature of diabetes and the pathology behind its complications. -his diabetes is complicated by obesity/sedentary life and he remains at a high risk for more acute and chronic complications which include CAD, CVA, CKD, retinopathy, and neuropathy. These are  all discussed in detail with him.  - I have counseled him on diet  and weight management  by adopting a carbohydrate restricted/protein rich diet. Patient is encouraged to switch to  unprocessed or minimally processed     complex starch and increased protein intake (animal or plant source), fruits, and vegetables. -  he is advised to stick to a routine mealtimes to eat 3 meals  a day and avoid unnecessary snacks ( to snack only to correct hypoglycemia).   - he  admits there is a room for improvement in his diet and drink choices. -  Suggestion is made for him to avoid simple carbohydrates  from his diet including Cakes, Sweet Desserts / Pastries, Ice Cream, Soda (diet and regular), Sweet Tea, Candies, Chips, Cookies, Sweet Pastries,  Store Bought Juices, Alcohol in Excess of  1-2 drinks a day, Artificial Sweeteners, Coffee Creamer, and "Sugar-free" Products. This will help patient to have stable blood glucose profile and potentially avoid unintended weight gain.   - he will be scheduled with Jearld Fenton, RDN, CDE for diabetes education.  - I have approached him with the following individualized plan to manage  his diabetes and patient agrees:   - he will  continue to require insulin treatment in order for him to achieve and maintain control of diabetes to target.   -Accordingly, he is advised to lower his Levemir to 50 units nightly, lower his NovoLog to 5  units 3 times a day with meals  for pre-meal BG readings of 90-195m/dl, plus patient specific correction dose for unexpected hyperglycemia above 1536mdl, associated with strict monitoring of glucose 4 times a day-before meals and at bedtime. - he is warned not to take insulin without proper monitoring per orders. - Adjustment parameters are given to him for hypo and hyperglycemia in writing. - he is encouraged to call clinic for blood glucose levels less than 70 or above 300 mg /dl. - he is advised to continue Metformin 500 mg p.o. twice  daily, therapeutically suitable for patient .  He will be considered for incretin therapy as needed on subsequent visits. - Specific targets for  A1c;  LDL, HDL,  and Triglycerides were discussed with the patient.  2) Blood Pressure /Hypertension:  his blood pressure is not controlled to target.    he is advised to continue his current medications including hydrochlorothiazide 25 mg daily, losartan 100 mg p.o. daily, metoprolol 100 mg p.o. twice daily . -I discussed and added clonidine 0.1 mg p.o. twice daily, for better control of hypertension.   3) Lipids/Hyperlipidemia: No recent lipid panel to review.  He is advised to continue simvastatin 20 mg p.o. nightly.  Side effects and precautions discussed with him.   4)  Weight/Diet:  Body mass index is 37.4 kg/m.  -   clearly complicating his diabetes care.   he is  a candidate for weight loss. I discussed with him the fact that loss of 5 - 10% of his  current body weight will have the most impact on his diabetes management.  Exercise, and detailed carbohydrates information provided  -  detailed on discharge instructions.  5) Chronic Care/Health Maintenance:  -he  is on ACEI/ARB and Statin medications and  is encouraged to initiate and continue to follow up with Ophthalmology, Dentist,  Podiatrist at least yearly or according to recommendations, and advised to  stay away from smoking. I have recommended yearly flu vaccine and pneumonia vaccine at least every 5 years; moderate intensity exercise for up to 150 minutes weekly; and  sleep for at least 7 hours a day.  - he is  advised to maintain close follow up with FaRosita FireMD for primary care needs, as well as his other providers for optimal and coordinated care.   - Time spent on this patient care encounter:  35 min, of which > 50% was spent in  counseling and the rest reviewing his blood glucose logs , discussing his hypoglycemia and hyperglycemia episodes, reviewing his current and   previous labs / studies  ( including abstraction from other facilities) and medications  doses and developing a  long term treatment plan and documenting his care.   Please refer to Patient Instructions for Blood Glucose Monitoring and Insulin/Medications Dosing Guide"  in media tab for additional information. Please  also refer to " Patient Self Inventory" in the Media  tab for reviewed elements of pertinent patient history.  WoGlenetta Borgarticipated in the discussions, expressed understanding, and voiced agreement with the above plans.  All questions were answered to his satisfaction. he is encouraged to contact clinic should he have any questions or concerns prior to his return visit.   Follow up plan: - Return in 9  weeks (on 02/20/2020) for F/U with Pre-visit Labs, Meter, Logs, A1c here., NV Office Urine MA, NV A1c in Office.  Glade Lloyd, MD [facility] [address] [place], [place] [address] Phone: [phone]  Fax: [phone]    12/19/2019, 5:18 PM  This note was partially dictated with voice recognition software. Similar sounding words can be transcribed inadequately or may not  be corrected upon review.

## 2020-02-05 ENCOUNTER — Other Ambulatory Visit: Payer: Self-pay

## 2020-02-05 ENCOUNTER — Encounter: Payer: Medicare HMO | Attending: "Endocrinology | Admitting: Nutrition

## 2020-02-05 ENCOUNTER — Encounter: Payer: Self-pay | Admitting: Nutrition

## 2020-02-05 VITALS — Ht 68.0 in | Wt 243.4 lb

## 2020-02-05 DIAGNOSIS — E1165 Type 2 diabetes mellitus with hyperglycemia: Secondary | ICD-10-CM | POA: Insufficient documentation

## 2020-02-05 DIAGNOSIS — E118 Type 2 diabetes mellitus with unspecified complications: Secondary | ICD-10-CM | POA: Diagnosis not present

## 2020-02-05 DIAGNOSIS — IMO0002 Reserved for concepts with insufficient information to code with codable children: Secondary | ICD-10-CM

## 2020-02-05 DIAGNOSIS — K760 Fatty (change of) liver, not elsewhere classified: Secondary | ICD-10-CM | POA: Insufficient documentation

## 2020-02-05 DIAGNOSIS — E669 Obesity, unspecified: Secondary | ICD-10-CM | POA: Diagnosis present

## 2020-02-05 NOTE — Patient Instructions (Signed)
Goals  Walking 40 minutes 3- 4 days per week. Cut out eating after 7 pm Aim to get A1C to 7%. Lose 2 lbs per month.

## 2020-02-05 NOTE — Progress Notes (Signed)
  Medical Nutrition Therapy:  Appt start time: 1300 end time:  1330.  Lab Results  Component Value Date   HGBA1C 8.5 10/10/2019   Assessment:  Primary concerns today: Dm Follow up   Checking blood pressure sometimes. [id number]'F. 160-180s' before supper and dinner  . Interested in the [place] . He checks BS 4 times a day Walking 40 minutes  A day. 50 of Levemir.Gerald Evans  5 units of Novolog with sliding scale with meals.   Diabetes Type 2 with Fatty Liver.Gerald Evans Here with his wife. He has cut out sweets and most snacks, except after supper. He tends to stay up late and sleep in some. A1C down to 8.5% in April. Will get A1C done next month. Lost 3 lbs. Changes made: eating 3 meals per day. Drinking water. Walking some. Levemir 50 units a day. Metformin 850 mg BID. Dr. Legrand Rams PCP. Feels much better.  .  Avg BS readings 150 mg/dl..  .  Scheduled to see Dr. Legrand Rams in April 2020. Willing to start going to [facility]  Or walking for further weight loss and improved BS control.  Lab Results  Component Value Date   HGBA1C 8.5 10/10/2019     Current diet is excessive in calories contributing to elevated BS and weigh gain. Vitals with BMI 08/04/2018 05/12/2018  Height 5' 8"  5' 8"   Weight 238 lbs 237 lbs 14 oz  BMI 41.4 23.95  Systolic  320  Diastolic  72  Pulse  60  Respirations     Preferred Learning Style:   No preference indicated   Learning Readiness:   Ready  Change in progress   MEDICATIONS: See list   DIETARY INTAKE:  24-hr recall:  B ( AM): coffee, oatmeal and fruit, 2 egg or misc   pb , water L ( PM): salmon, lower carb vegetables- spiranch, or brooli, fruit, water  D)  Watermelon, shrimp, egg noodles, green beans, Snack dark chocolate, Beverages:water  Usual physical activity: walking.   Estimated energy needs: 1800 calories 200  g carbohydrates 135 g protein 50 g fat  Progress Towards Goal(s):  Some progress.   Nutritional Diagnosis:  NB-1.1 Food and nutrition-related  knowledge deficit As related to Diabetes.  As evidenced by  A1C .    Intervention:  Nutrition and Diabetes education provided on My Plate, CHO counting, meal planning, portion sizes, timing of meals, avoiding snacks between meals unless having a low blood sugar, target ranges for A1C and blood sugars, signs/symptoms and treatment of hyper/hypoglycemia, monitoring blood sugars, taking medications as prescribed, benefits of exercising 30 minutes per day and prevention of complications of DM. Heart Healthy Diet, Lower Fat  Goals  Walking 40 minutes 3- 4 days per week. Cut out eating after 7 pm Aim to get A1C to 7%. Lose 2 lbs per month.  Teaching Method Utilized:  Visual Auditory Hands on  Handouts given during visit include:  The Plate Method  Meal Plan Card   Barriers to learning/adherence to lifestyle change: none  Demonstrated degree of understanding via:  Teach Back   Monitoring/Evaluation:  Dietary intake, exercise, , and body weight in 3 month(s). He would benefit from the [employer] CGM for improved blood sugar control.

## 2020-02-16 ENCOUNTER — Other Ambulatory Visit: Payer: Self-pay | Admitting: "Endocrinology

## 2020-02-20 ENCOUNTER — Encounter: Payer: Self-pay | Admitting: Nutrition

## 2020-03-19 LAB — COMPLETE METABOLIC PANEL WITH GFR
AG Ratio: 1.7 (calc) (ref 1.0–2.5)
ALT: 6 U/L — ABNORMAL LOW (ref 9–46)
AST: 10 U/L (ref 10–35)
Albumin: 4.4 g/dL (ref 3.6–5.1)
Alkaline phosphatase (APISO): 69 U/L (ref 35–144)
BUN: 13 mg/dL (ref 7–25)
CO2: 26 mmol/L (ref 20–32)
Calcium: 9.6 mg/dL (ref 8.6–10.3)
Chloride: 103 mmol/L (ref 98–110)
Creat: 1 mg/dL (ref 0.70–1.18)
GFR, Est African American: 86 mL/min/{1.73_m2} (ref >=60)
GFR, Est Non African American: 74 mL/min/{1.73_m2} (ref >=60)
Globulin: 2.6 g/dL (calc) (ref 1.9–3.7)
Glucose, Bld: 230 mg/dL — ABNORMAL HIGH (ref 65–99)
Potassium: 4 mmol/L (ref 3.5–5.3)
Sodium: 138 mmol/L (ref 135–146)
Total Bilirubin: 0.4 mg/dL (ref 0.2–1.2)
Total Protein: 7 g/dL (ref 6.1–8.1)

## 2020-03-19 LAB — LIPID PANEL
Cholesterol: 138 mg/dL (ref <200)
HDL: 44 mg/dL (ref >=40)
LDL Cholesterol (Calc): 74 mg/dL (calc)
Non-HDL Cholesterol (Calc): 94 mg/dL (calc) (ref <130)
Total CHOL/HDL Ratio: 3.1 (calc) (ref <5.0)
Triglycerides: 121 mg/dL (ref <150)

## 2020-03-19 LAB — TSH: TSH: 3.96 mIU/L (ref 0.40–4.50)

## 2020-03-19 LAB — T4, FREE: Free T4: 1 ng/dL (ref 0.8–1.8)

## 2020-03-20 ENCOUNTER — Ambulatory Visit (INDEPENDENT_AMBULATORY_CARE_PROVIDER_SITE_OTHER): Payer: Medicare HMO | Admitting: Nurse Practitioner

## 2020-03-20 ENCOUNTER — Other Ambulatory Visit: Payer: Self-pay

## 2020-03-20 ENCOUNTER — Encounter: Payer: Self-pay | Admitting: Nurse Practitioner

## 2020-03-20 ENCOUNTER — Encounter: Payer: Medicare HMO | Attending: "Endocrinology | Admitting: Nutrition

## 2020-03-20 VITALS — BP 165/77 | HR 59 | Ht 68.0 in | Wt 240.4 lb

## 2020-03-20 DIAGNOSIS — E669 Obesity, unspecified: Secondary | ICD-10-CM | POA: Diagnosis present

## 2020-03-20 DIAGNOSIS — E782 Mixed hyperlipidemia: Secondary | ICD-10-CM

## 2020-03-20 DIAGNOSIS — I10 Essential (primary) hypertension: Secondary | ICD-10-CM | POA: Diagnosis present

## 2020-03-20 DIAGNOSIS — E1165 Type 2 diabetes mellitus with hyperglycemia: Secondary | ICD-10-CM

## 2020-03-20 DIAGNOSIS — IMO0002 Reserved for concepts with insufficient information to code with codable children: Secondary | ICD-10-CM

## 2020-03-20 DIAGNOSIS — E118 Type 2 diabetes mellitus with unspecified complications: Secondary | ICD-10-CM | POA: Diagnosis not present

## 2020-03-20 DIAGNOSIS — K760 Fatty (change of) liver, not elsewhere classified: Secondary | ICD-10-CM

## 2020-03-20 LAB — POCT GLYCOSYLATED HEMOGLOBIN (HGB A1C): Hemoglobin A1C: 8.8 % — AB (ref 4.0–5.6)

## 2020-03-20 LAB — POCT UA - MICROALBUMIN: Microalbumin Ur, POC: 80 mg/L

## 2020-03-20 MED ORDER — DEXCOM G6 RECEIVER DEVI: 1.0000 | 0 refills | Status: DC | PRN

## 2020-03-20 MED ORDER — LEVEMIR FLEXTOUCH 100 UNIT/ML ~~LOC~~ SOPN: 60.0000 [IU] | PEN_INJECTOR | Freq: Every day | SUBCUTANEOUS | 3 refills | Status: DC

## 2020-03-20 NOTE — Progress Notes (Signed)
03/20/2020, 11:53 AM  Endocrinology follow-up note   Subjective:    Patient ID: Gerald Evans, male    DOB: 01/30/1946.  Gerald Evans is being seen in follow-up after he was seen in consultation for management of currently uncontrolled symptomatic diabetes requested by  Rosita Fire, MD.   Past Medical History:  Diagnosis Date  . Colon polyps   . Diabetes mellitus without complication ([place])   . Fatty liver   . Hypercholesteremia   . Hypertension     Past Surgical History:  Procedure Laterality Date  . COLONOSCOPY    . COLONOSCOPY  04/21/2012   Procedure: COLONOSCOPY;  Surgeon: Rogene Houston, MD;  [facility]: [facility];  Service: Endoscopy;  Laterality: N/A;  1200  . COLONOSCOPY N/A 09/09/2017   Procedure: COLONOSCOPY;  Surgeon: Rogene Houston, MD;  Location: [facility];  Service: Endoscopy;  Laterality: N/A;  1200-rescheduled to 3/14/ @12 :00pm per Lelon Frohlich  . HERNIA REPAIR    . POLYPECTOMY  09/09/2017   Procedure: POLYPECTOMY;  Surgeon: Rogene Houston, MD;  Location: [facility];  Service: Endoscopy;;  Transverse Colon (CB)    Social History   Socioeconomic History  . Marital status: Married    Spouse name: Not on file  . Number of children: Not on file  . Years of education: Not on file  . Highest education level: Not on file  Occupational History  . Not on file  Tobacco Use  . Smoking status: Former Smoker    Packs/day: 0.50    Years: 20.00    Pack years: 10.00    Types: Cigarettes  . Smokeless tobacco: Never Used  Vaping Use  . Vaping Use: Never used  Substance and Sexual Activity  . Alcohol use: Yes    Alcohol/week: 4.0 standard drinks    Types: 4 Standard drinks or equivalent per week    Comment: Patient states that he drinks on weekends, has ben dry for 2 weeks per patient  . Drug use: No  . Sexual activity: Not on file  Other Topics Concern  . Not on file   Social History Narrative  . Not on file   Social Determinants of Health   Financial Resource Strain:   . Difficulty of Paying Living Expenses: Not on file  Food Insecurity:   . Worried About Charity fundraiser in the Last Year: Not on file  . Ran Out of Food in the Last Year: Not on file  Transportation Needs:   . Lack of Transportation (Medical): Not on file  . Lack of Transportation (Non-Medical): Not on file  Physical Activity:   . Days of Exercise per Week: Not on file  . Minutes of Exercise per Session: Not on file  Stress:   . Feeling of Stress : Not on file  Social Connections:   . Frequency of Communication with Friends and Family: Not on file  . Frequency of Social Gatherings with Friends and Family: Not on file  . Attends Religious Services: Not on file  . Active Member of Clubs or Organizations: Not on file  . Attends Archivist Meetings: Not  on file  . Marital Status: Not on file    Family History  Problem Relation Age of Onset  . Hypertension Mother   . Hypertension Father   . Diabetes Sister   . Hypertension Sister   . Healthy Son   . Healthy Daughter   . Colon cancer Neg Hx     Outpatient Encounter Medications as of 03/20/2020  Medication Sig  . aspirin EC 81 MG tablet Take 1 tablet (81 mg total) by mouth daily.  . blood glucose meter kit and supplies Dispense based on patient and insurance preference. Use up to four times daily as directed. (FOR ICD-10 E11.65)  . Blood Glucose Monitoring Suppl (ACCU-CHEK GUIDE) w/Device KIT 1 Piece by Does not apply route as directed.  . cloNIDine (CATAPRES) 0.2 MG tablet Take 1 tablet (0.2 mg total) by mouth 2 (two) times daily.  Marland Kitchen glucose blood (ACCU-CHEK GUIDE) test strip Use as instructed to check blood glucose four times daily.  Marland Kitchen glucose blood test strip 1 each by Other route in the morning, at noon, in the evening, and at bedtime. Use as instructed qid. One touch ultra blue 2. E11.65  .  hydrochlorothiazide (HYDRODIURIL) 25 MG tablet Take 25 mg by mouth daily.  . insulin detemir (LEVEMIR FLEXTOUCH) 100 UNIT/ML FlexPen Inject 60 Units into the skin at bedtime.  . Lancets (ONETOUCH DELICA PLUS ZPHXTA56P) MISC Apply topically 4 (four) times daily.  Marland Kitchen losartan (COZAAR) 100 MG tablet Take 100 mg by mouth daily.  . metFORMIN (GLUCOPHAGE) 1000 MG tablet Take 1,000 mg by mouth 2 (two) times daily.  . metoprolol (LOPRESSOR) 100 MG tablet Take 100 mg by mouth 2 (two) times daily.   Marland Kitchen NOVOLOG FLEXPEN 100 UNIT/ML FlexPen Inject 5-11 Units into the skin 3 (three) times daily before meals.  Glory Rosebush ULTRA test strip USE 1 STRIP TO CHECK GLUCOSE 4 TIMES DAILY AS DIRECTED  . sildenafil (VIAGRA) 100 MG tablet Take 100 mg by mouth daily as needed.  . simvastatin (ZOCOR) 20 MG tablet Take 20 mg by mouth daily.  . [DISCONTINUED] insulin detemir (LEVEMIR FLEXTOUCH) 100 UNIT/ML FlexPen Inject 50 Units into the skin at bedtime.  . Continuous Blood Gluc Receiver (DEXCOM G6 RECEIVER) DEVI 1 Piece by Does not apply route as needed.   No facility-administered encounter medications on file as of 03/20/2020.    ALLERGIES: Allergies  Allergen Reactions  . Lisinopril     cough    VACCINATION STATUS: Immunization History  Administered Date(s) Administered  . PFIZER SARS-COV-2 Vaccination 08/20/2019, 09/12/2019    Diabetes He presents for his follow-up diabetic visit. He has type 2 diabetes mellitus. Onset time: He was diagnosed at approximate age of 23 years. His disease course has been worsening. There are no hypoglycemic associated symptoms. Pertinent negatives for hypoglycemia include no confusion, headaches, pallor or seizures. Associated symptoms include polydipsia and polyuria. Pertinent negatives for diabetes include no chest pain, no fatigue, no polyphagia and no weakness. There are no hypoglycemic complications. Symptoms are worsening. Diabetic complications include impotence. Risk factors  for coronary artery disease include dyslipidemia, diabetes mellitus, family history, tobacco exposure, sedentary lifestyle, male sex, obesity and hypertension. Current diabetic treatment includes insulin injections and oral agent (monotherapy). He is compliant with treatment some of the time. His weight is stable. He is following a generally unhealthy diet. When asked about meal planning, he reported none. He has not had a previous visit with a dietitian. He participates in exercise intermittently. His home blood glucose trend  is fluctuating minimally. His breakfast blood glucose range is generally 180-200 mg/dl. His lunch blood glucose range is generally 180-200 mg/dl. (He presents today, accompanied by his wife, with his meter and logs showing significantly above target fasting and postprandial glycemic profile overall.  His POCT A1C today is 8.8%, slightly worse than last visit of 8.5%.  He has not been monitoring blood glucose 4 times per day as recommended (currently doing 1-2 times per day). ) An ACE inhibitor/angiotensin II receptor blocker is being taken. Eye exam is current.  Hyperlipidemia This is a chronic problem. The current episode started more than 1 year ago. Exacerbating diseases include diabetes and obesity. Pertinent negatives include no chest pain, myalgias or shortness of breath. Current antihyperlipidemic treatment includes statins. Risk factors for coronary artery disease include diabetes mellitus, dyslipidemia, hypertension, male sex, obesity, a sedentary lifestyle and family history.  Hypertension This is a chronic problem. The current episode started more than 1 year ago. Pertinent negatives include no chest pain, headaches, neck pain, palpitations or shortness of breath. Risk factors for coronary artery disease include dyslipidemia, diabetes mellitus, male gender, obesity and smoking/tobacco exposure. Past treatments include angiotensin blockers.    Review of  systems  Constitutional: + Minimally fluctuating body weight,  current Body mass index is 36.55 kg/m. , no fatigue, no subjective hyperthermia, no subjective hypothermia Eyes: no blurry vision, no xerophthalmia ENT: no sore throat, no nodules palpated in throat, no dysphagia/odynophagia, no hoarseness Cardiovascular: no chest pain, no shortness of breath, no palpitations, no leg swelling Respiratory: no cough, no shortness of breath Gastrointestinal: no nausea/vomiting/diarrhea Musculoskeletal: no muscle/joint aches Skin: no rashes, no hyperemia, brittle nails Neurological: no tremors, no numbness, no tingling, no dizziness Psychiatric: no depression, no anxiety  Objective:    BP (!) 165/77 (BP Location: Left Arm, Patient Position: Sitting)   Pulse (!) 59   Ht 5' 8"  (1.727 m)   Wt 240 lb 6.4 oz (109 kg)   BMI 36.55 kg/m   Wt Readings from Last 3 Encounters:  03/20/20 240 lb 6.4 oz (109 kg)  02/05/20 243 lb 6.4 oz (110.4 kg)  12/19/19 246 lb (111.6 kg)    BP Readings from Last 3 Encounters:  03/20/20 (!) 165/77  12/19/19 (!) 171/78  11/22/19 (!) 167/93     Physical Exam- Limited  Constitutional:  Body mass index is 36.55 kg/m. , not in acute distress, normal state of mind Eyes:  EOMI, no exophthalmos Neck: Supple Thyroid: No gross goiter Cardiovascular: RRR, no murmers, rubs, or gallops, no edema Respiratory: Adequate breathing efforts, no crackles, rales, rhonchi, or wheezing Musculoskeletal: no gross deformities, strength intact in all four extremities, no gross restriction of joint movements Skin:  no rashes, no hyperemia, brittle nails Neurological: no tremor with outstretched hands  CMP ( most recent) CMP     Component Value Date/Time   NA 138 03/18/2020 1321   K 4.0 03/18/2020 1321   CL 103 03/18/2020 1321   CO2 26 03/18/2020 1321   GLUCOSE 230 (H) 03/18/2020 1321   BUN 13 03/18/2020 1321   CREATININE 1.00 03/18/2020 1321   CALCIUM 9.6 03/18/2020 1321    PROT 7.0 03/18/2020 1321   ALBUMIN 4.2 11/10/2016 0937   AST 10 03/18/2020 1321   ALT 6 (L) 03/18/2020 1321   ALKPHOS 72 11/10/2016 0937   BILITOT 0.4 03/18/2020 1321   GFRNONAA 74 03/18/2020 1321   GFRAA 86 03/18/2020 1321    Diabetic Labs (most recent): Lab Results  Component Value Date  HGBA1C 8.8 (A) 03/20/2020   HGBA1C 8.5 10/10/2019     Assessment & Plan:   1. Uncontrolled type 2 diabetes mellitus with hyperglycemia ([place])  - FURQAN GOSSELIN has currently uncontrolled symptomatic type 2 DM since  74 years of age.  He presents today, accompanied by his wife, with his meter and logs showing significantly above target fasting and postprandial glycemic profile overall.  His POCT A1C today is 8.8%, slightly worse than last visit of 8.5%.  He has not been monitoring blood glucose 4 times per day as recommended (currently doing 1-2 times per day).   Recent labs reviewed.  - I had a long discussion with him about the progressive nature of diabetes and the pathology behind its complications. -his diabetes is complicated by obesity/sedentary life and he remains at a high risk for more acute and chronic complications which include CAD, CVA, CKD, retinopathy, and neuropathy. These are all discussed in detail with him.  - The patient admits there is a room for improvement in their diet and drink choices. -  Suggestion is made for the patient to avoid simple carbohydrates from their diet including Cakes, Sweet Desserts / Pastries, Ice Cream, Soda (diet and regular), Sweet Tea, Candies, Chips, Cookies, Sweet Pastries,  Store Bought Juices, Alcohol in Excess of  1-2 drinks a day, Artificial Sweeteners, Coffee Creamer, and "Sugar-free" Products. This will help patient to have stable blood glucose profile and potentially avoid unintended weight gain.   - I encouraged the patient to switch to  unprocessed or minimally processed complex starch and increased protein intake (animal or plant  source), fruits, and vegetables.   - Patient is advised to stick to a routine mealtimes to eat 3 meals  a day and avoid unnecessary snacks ( to snack only to correct hypoglycemia).  - he is scheduled with Jearld Fenton, RDN, CDE for diabetes education today.  - I have approached him with the following individualized plan to manage  his diabetes and patient agrees:   - he will continue to require insulin treatment in order for him to achieve and maintain control of diabetes to target.    -Based on fasting hyperglycemia, he will benefit from increase in Levemir to 60 units SQ nightly, and continue current dose of Novolog 5-11 units SQ TID with meals if blood glucose above 90 and eating.  Specific instructions on how to titrate insulin dose based on glucose readings given to patient in writing.  He is to continue current dose of Metformin 500 mg po twice daily with meals.  -He is encouraged to do better monitoring his blood glucose.  He is urged to monitor sugar at least 4 times per day, before meals and at bedtime and call the clinic if readings are less than 70 or greater than 300 for 3 tests in a row.  He would benefit greatly from CGM device.  Sample Dexcom G6 given from office.  He will be considered for incretin therapy as needed on subsequent visits. - Specific targets for  A1c;  LDL, HDL,  and Triglycerides were discussed with the patient.  2) Blood Pressure /Hypertension:  His blood pressure is not controlled to target.  He had not long taken his medication prior to his appointment.  He is advised to continue Clonidine 0.2 mg po twice daily, HCTZ 25 mg po daily, Losartan 100 mg po daily, and metoprolol 100 mg po twice daily.  3) Lipids/Hyperlipidemia:  No recent lipid panel to review.  He is  advised to continue Simvastatin 20 mg po daily at bedtime.  Side effects and precautions discussed with him.  Will recheck lipid panel prior to next visit.  4)  Weight/Diet:  His Body mass index is  36.55 kg/m.  -   clearly complicating his diabetes care.   he is  a candidate for weight loss. I discussed with him the fact that loss of 5 - 10% of his  current body weight will have the most impact on his diabetes management.  Exercise, and detailed carbohydrates information provided  -  detailed on discharge instructions.  5) Chronic Care/Health Maintenance: -he  is on ACEI/ARB and Statin medications and  is encouraged to initiate and continue to follow up with Ophthalmology, Dentist,  Podiatrist at least yearly or according to recommendations, and advised to  stay away from smoking. I have recommended yearly flu vaccine and pneumonia vaccine at least every 5 years; moderate intensity exercise for up to 150 minutes weekly; and  sleep for at least 7 hours a day.  - he is  advised to maintain close follow up with Rosita Fire, MD for primary care needs, as well as his other providers for optimal and coordinated care.   - Time spent on this patient care encounter:  35 min, of which > 50% was spent in  counseling and the rest reviewing his blood glucose logs , discussing his hypoglycemia and hyperglycemia episodes, reviewing his current and  previous labs / studies  ( including abstraction from other facilities) and medications  doses and developing a  long term treatment plan and documenting his care.   Please refer to Patient Instructions for Blood Glucose Monitoring and Insulin/Medications Dosing Guide"  in media tab for additional information. Please  also refer to " Patient Self Inventory" in the Media  tab for reviewed elements of pertinent patient history.  [name] participated in the discussions, expressed understanding, and voiced agreement with the above plans.  All questions were answered to his satisfaction. he is encouraged to contact clinic should he have any questions or concerns prior to his return visit.  Follow up plan: - Return in about 4 months (around 07/20/2020) for  Diabetes follow up, Previsit labs, Virtual visit ok.  Rayetta Pigg, [facility] [address] [place], [place] [address] Phone: [phone] Fax: [phone]  03/20/2020, 11:53 AM  This note was partially dictated with voice recognition software. Similar sounding words can be transcribed inadequately or may not  be corrected upon review.

## 2020-03-20 NOTE — Patient Instructions (Signed)

## 2020-03-20 NOTE — Progress Notes (Signed)
  Medical Nutrition Therapy:  Appt start time: 1300 end time:  1330.  Assessment:  Primary concerns today: Dm Follow up   A1C up from 8.5% to 8.8%. His wife is here with him today. Lantus 60 and Novolog 5 units plus sliding scale. Metformin 1000 mg BID. Admits to eating more than he should and snacking.Was prescribed the decom. Has an older phone and so will need reader to use Dexcom. Sample dexcom given my Rayetta Pigg, FNP today. Willing to work on eating better and start walking to lose weight and improve his DM.Marland Kitchen Lost 6 lbs in the last 3 months.   Lab Results  Component Value Date   HGBA1C 8.8 (A) 03/20/2020     Current diet is excessive in calories contributing to elevated BS and weigh gain. Vitals with BMI 08/04/2018 05/12/2018  Height 5' 8"  5' 8"   Weight 238 lbs 237 lbs 14 oz  BMI 52.8 41.32  Systolic  440  Diastolic  72  Pulse  60  Respirations     Preferred Learning Style:   No preference indicated   Learning Readiness:   Ready  Change in progress   MEDICATIONS: See list   DIETARY INTAKE:  24-hr recall:  B ( AM): coffee, oatmeal and fruit, 2 egg or misc   pb , water L ( PM): salmon, lower carb vegetables- spiranch, or brooli, fruit, water  D)  Watermelon, shrimp, egg noodles, green beans, Snack dark chocolate, Beverages:water  Usual physical activity: walking.   Estimated energy needs: 1800 calories 200  g carbohydrates 135 g protein 50 g fat  Progress Towards Goal(s):  Some progress.   Nutritional Diagnosis:  NB-1.1 Food and nutrition-related knowledge deficit As related to Diabetes.  As evidenced by  A1C .    Intervention:  Nutrition and Diabetes education provided on My Plate, CHO counting, meal planning, portion sizes, timing of meals, avoiding snacks between meals unless having a low blood sugar, target ranges for A1C and blood sugars, signs/symptoms and treatment of hyper/hypoglycemia, monitoring blood sugars, taking medications as  prescribed, benefits of exercising 30 minutes per day and prevention of complications of DM. Heart Healthy Diet, Lower Fat  Goals  Cut out snacks Walk 30-60 minutes 3 times per week. Don't eat past 7 pm. Pick up reader at pharmacy and call back to schedule appt to put on Dexcom. Get A1C down to 7.5%.   Teaching Method Utilized:  Visual Auditory Hands on  Handouts given during visit include:  The Plate Method  Meal Plan Card   Barriers to learning/adherence to lifestyle change: none  Demonstrated degree of understanding via:  Teach Back   Monitoring/Evaluation:  Dietary intake, exercise, , and body weight in 3 month(s).

## 2020-03-21 ENCOUNTER — Encounter: Payer: Self-pay | Admitting: Nutrition

## 2020-03-21 NOTE — Patient Instructions (Signed)
Goals  Cut out snacks Walk 30-60 minutes 3 times per week. Don't eat past 7 pm. Pick up reader at pharmacy and call back to schedule appt to put on Dexcom. Get A1C down to 7.5%.

## 2020-04-02 ENCOUNTER — Other Ambulatory Visit: Payer: Medicare HMO

## 2020-04-02 ENCOUNTER — Other Ambulatory Visit: Payer: Self-pay | Admitting: *Deleted

## 2020-04-02 DIAGNOSIS — Z20822 Contact with and (suspected) exposure to covid-19: Secondary | ICD-10-CM

## 2020-04-03 LAB — SARS-COV-2, NAA 2 DAY TAT

## 2020-04-03 LAB — NOVEL CORONAVIRUS, NAA: SARS-CoV-2, NAA: NOT DETECTED

## 2020-04-03 LAB — SPECIMEN STATUS REPORT

## 2020-04-17 ENCOUNTER — Other Ambulatory Visit: Payer: Self-pay | Admitting: "Endocrinology

## 2020-04-25 ENCOUNTER — Ambulatory Visit: Payer: Medicare HMO | Attending: Internal Medicine

## 2020-04-25 DIAGNOSIS — Z23 Encounter for immunization: Secondary | ICD-10-CM

## 2020-04-25 NOTE — Progress Notes (Signed)
   Covid-19 Vaccination Clinic  Name:  Gerald Evans    MRN: [id number] DOB: 09-29-1945  04/25/2020  [name] was observed post Covid-19 immunization for 15 minutes without incident. He was provided with Vaccine Information Sheet and instruction to access the V-Safe system.   [name] was instructed to call 911 with any severe reactions post vaccine: Marland Kitchen Difficulty breathing  . Swelling of face and throat  . A fast heartbeat  . A bad rash all over body  . Dizziness and weakness

## 2020-06-18 ENCOUNTER — Other Ambulatory Visit: Payer: Self-pay

## 2020-06-18 DIAGNOSIS — E1165 Type 2 diabetes mellitus with hyperglycemia: Secondary | ICD-10-CM

## 2020-06-18 MED ORDER — ONETOUCH DELICA PLUS LANCET33G MISC: 3 refills | Status: DC

## 2020-07-16 ENCOUNTER — Ambulatory Visit (INDEPENDENT_AMBULATORY_CARE_PROVIDER_SITE_OTHER): Payer: Medicare HMO | Admitting: Internal Medicine

## 2020-07-18 LAB — COMPREHENSIVE METABOLIC PANEL
ALT: 7 IU/L (ref 0–44)
AST: 13 IU/L (ref 0–40)
Albumin/Globulin Ratio: 1.7 (ref 1.2–2.2)
Albumin: 4.3 g/dL (ref 3.7–4.7)
Alkaline Phosphatase: 81 IU/L (ref 44–121)
BUN/Creatinine Ratio: 16 (ref 10–24)
BUN: 20 mg/dL (ref 8–27)
Bilirubin Total: 0.3 mg/dL (ref 0.0–1.2)
CO2: 25 mmol/L (ref 20–29)
Calcium: 9.6 mg/dL (ref 8.6–10.2)
Chloride: 100 mmol/L (ref 96–106)
Creatinine, Ser: 1.23 mg/dL (ref 0.76–1.27)
GFR calc Af Amer: 67 mL/min/{1.73_m2} (ref >59)
GFR calc non Af Amer: 58 mL/min/{1.73_m2} — ABNORMAL LOW (ref >59)
Globulin, Total: 2.6 g/dL (ref 1.5–4.5)
Glucose: 108 mg/dL — ABNORMAL HIGH (ref 65–99)
Potassium: 3.6 mmol/L (ref 3.5–5.2)
Sodium: 139 mmol/L (ref 134–144)
Total Protein: 6.9 g/dL (ref 6.0–8.5)

## 2020-07-18 LAB — HEMOGLOBIN A1C
Est. average glucose Bld gHb Est-mCnc: 229 mg/dL
Hgb A1c MFr Bld: 9.6 % — ABNORMAL HIGH (ref 4.8–5.6)

## 2020-07-19 ENCOUNTER — Other Ambulatory Visit: Payer: Self-pay | Admitting: "Endocrinology

## 2020-07-19 ENCOUNTER — Encounter (INDEPENDENT_AMBULATORY_CARE_PROVIDER_SITE_OTHER): Payer: Self-pay

## 2020-07-22 ENCOUNTER — Ambulatory Visit (INDEPENDENT_AMBULATORY_CARE_PROVIDER_SITE_OTHER): Payer: Medicare HMO | Admitting: Internal Medicine

## 2020-07-23 ENCOUNTER — Ambulatory Visit (INDEPENDENT_AMBULATORY_CARE_PROVIDER_SITE_OTHER): Payer: Medicare HMO | Admitting: Nurse Practitioner

## 2020-07-23 ENCOUNTER — Encounter: Payer: Self-pay | Admitting: Nurse Practitioner

## 2020-07-23 ENCOUNTER — Other Ambulatory Visit: Payer: Self-pay

## 2020-07-23 VITALS — BP 187/88 | HR 54 | Ht 68.0 in | Wt 240.2 lb

## 2020-07-23 DIAGNOSIS — I10 Essential (primary) hypertension: Secondary | ICD-10-CM | POA: Diagnosis not present

## 2020-07-23 DIAGNOSIS — E1165 Type 2 diabetes mellitus with hyperglycemia: Secondary | ICD-10-CM

## 2020-07-23 DIAGNOSIS — E782 Mixed hyperlipidemia: Secondary | ICD-10-CM

## 2020-07-23 MED ORDER — NOVOLOG FLEXPEN 100 UNIT/ML ~~LOC~~ SOPN: 7.0000 [IU] | PEN_INJECTOR | Freq: Three times a day (TID) | SUBCUTANEOUS | 0 refills | Status: DC

## 2020-07-23 NOTE — Patient Instructions (Signed)

## 2020-07-23 NOTE — Progress Notes (Signed)
07/23/2020, 10:21 AM  Endocrinology follow-up note   Subjective:    Patient ID: Gerald Evans, male    DOB: 24-Jun-1946.  Gerald Evans is being seen in follow-up after he was seen in consultation for management of currently uncontrolled symptomatic diabetes requested by  Rosita Fire, MD.   Past Medical History:  Diagnosis Date  . Colon polyps   . Diabetes mellitus without complication ([place])   . Fatty liver   . Hypercholesteremia   . Hypertension     Past Surgical History:  Procedure Laterality Date  . COLONOSCOPY    . COLONOSCOPY  04/21/2012   Procedure: COLONOSCOPY;  Surgeon: Rogene Houston, MD;  [facility]: [facility];  Service: Endoscopy;  Laterality: N/A;  1200  . COLONOSCOPY N/A 09/09/2017   Procedure: COLONOSCOPY;  Surgeon: Rogene Houston, MD;  Location: [facility];  Service: Endoscopy;  Laterality: N/A;  1200-rescheduled to 3/14/ @12 :00pm per Lelon Frohlich  . HERNIA REPAIR    . POLYPECTOMY  09/09/2017   Procedure: POLYPECTOMY;  Surgeon: Rogene Houston, MD;  Location: [facility];  Service: Endoscopy;;  Transverse Colon ([facility])    Social History   Socioeconomic History  . Marital status: Married    Spouse name: Not on file  . Number of children: Not on file  . Years of education: Not on file  . Highest education level: Not on file  Occupational History  . Not on file  Tobacco Use  . Smoking status: Former Smoker    Packs/day: 0.50    Years: 20.00    Pack years: 10.00    Types: Cigarettes  . Smokeless tobacco: Never Used  Vaping Use  . Vaping Use: Never used  Substance and Sexual Activity  . Alcohol use: Yes    Alcohol/week: 4.0 standard drinks    Types: 4 Standard drinks or equivalent per week    Comment: Patient states that he drinks on weekends, has ben dry for 2 weeks per patient  . Drug use: No  . Sexual activity: Not on file  Other Topics Concern  . Not on file   Social History Narrative  . Not on file   Social Determinants of Health   Financial Resource Strain: Not on file  Food Insecurity: Not on file  Transportation Needs: Not on file  Physical Activity: Not on file  Stress: Not on file  Social Connections: Not on file    Family History  Problem Relation Age of Onset  . Hypertension Mother   . Hypertension Father   . Diabetes Sister   . Hypertension Sister   . Healthy Son   . Healthy Daughter   . Colon cancer Neg Hx     Outpatient Encounter Medications as of 07/23/2020  Medication Sig  . aspirin EC 81 MG tablet Take 1 tablet (81 mg total) by mouth daily.  . blood glucose meter kit and supplies Dispense based on patient and insurance preference. Use up to four times daily as directed. (FOR ICD-10 E11.65)  . Blood Glucose Monitoring Suppl (ACCU-CHEK GUIDE) w/Device KIT 1 Piece by Does not apply route as directed.  . cloNIDine (CATAPRES)  0.2 MG tablet Take 1 tablet by mouth twice daily  . glucose blood (ACCU-CHEK GUIDE) test strip Use as instructed to check blood glucose four times daily.  Marland Kitchen glucose blood test strip 1 each by Other route in the morning, at noon, in the evening, and at bedtime. Use as instructed qid. One touch ultra blue 2. E11.65  . hydrochlorothiazide (HYDRODIURIL) 25 MG tablet Take 25 mg by mouth daily.  . insulin detemir (LEVEMIR FLEXTOUCH) 100 UNIT/ML FlexPen Inject 60 Units into the skin at bedtime.  . Lancets (ONETOUCH DELICA PLUS [id number]) MISC Use as instructed to test blood glucose four times daily  . losartan (COZAAR) 100 MG tablet Take 100 mg by mouth daily.  . metFORMIN (GLUCOPHAGE) 1000 MG tablet Take 1,000 mg by mouth 2 (two) times daily.  . metoprolol (LOPRESSOR) 100 MG tablet Take 100 mg by mouth 2 (two) times daily.   Glory Rosebush ULTRA test strip USE 1 STRIP TO CHECK GLUCOSE 4 TIMES DAILY AS DIRECTED  . sildenafil (VIAGRA) 100 MG tablet Take 100 mg by mouth daily as needed.  . simvastatin (ZOCOR) 20  MG tablet Take 20 mg by mouth daily.  . [DISCONTINUED] NOVOLOG FLEXPEN 100 UNIT/ML FlexPen Inject 7-10 Units into the skin 3 (three) times daily before meals.  . Continuous Blood Gluc Receiver (DEXCOM G6 RECEIVER) DEVI 1 Piece by Does not apply route as needed. (Patient not taking: Reported on 07/23/2020)  . NOVOLOG FLEXPEN 100 UNIT/ML FlexPen Inject 7-10 Units into the skin 3 (three) times daily with meals.   No facility-administered encounter medications on file as of 07/23/2020.    ALLERGIES: Allergies  Allergen Reactions  . Lisinopril     cough    VACCINATION STATUS: Immunization History  Administered Date(s) Administered  . PFIZER(Purple Top)SARS-COV-2 Vaccination 08/20/2019, 09/12/2019, 04/25/2020    Diabetes He presents for his follow-up diabetic visit. He has type 2 diabetes mellitus. Onset time: He was diagnosed at approximate age of 39 years. His disease course has been fluctuating. There are no hypoglycemic associated symptoms. Pertinent negatives for hypoglycemia include no confusion, headaches, pallor or seizures. Associated symptoms include polydipsia and polyuria. Pertinent negatives for diabetes include no chest pain, no fatigue, no polyphagia and no weakness. There are no hypoglycemic complications. Symptoms are worsening. Diabetic complications include impotence. Risk factors for coronary artery disease include dyslipidemia, diabetes mellitus, family history, tobacco exposure, sedentary lifestyle, male sex, obesity and hypertension. Current diabetic treatment includes intensive insulin program and oral agent (monotherapy). He is compliant with treatment some of the time. His weight is stable. He is following a generally unhealthy diet. When asked about meal planning, he reported none. He has not had a previous visit with a dietitian. He participates in exercise intermittently. His home blood glucose trend is increasing steadily. His overall blood glucose range is 140-180 mg/dl.  (He presents today with his meter, no logs, still showing inconsistent monitoring pattern.  He has only been checking his glucose 1 or 2 times a day.  He has been missing opportunities to inject insulin due to lack of glucose monitoring.  His previsit A1c was 9.6%, worsening from last visit of 8.8%.  He denies any s/s of hypoglycemia.  Analysis of his meter shows 7-day average of 169 (with 9 readings); 14-day average of 160 (with 25 readings), and 30-day average of 166 (with 57 readings).) An ACE inhibitor/angiotensin II receptor blocker is being taken. He does not see a podiatrist.Eye exam is current.  Hyperlipidemia This is a chronic problem.  The current episode started more than 1 year ago. The problem is controlled. Recent lipid tests were reviewed and are normal. Exacerbating diseases include chronic renal disease, diabetes and obesity. Factors aggravating his hyperlipidemia include fatty foods, beta blockers and thiazides. Pertinent negatives include no chest pain, myalgias or shortness of breath. Current antihyperlipidemic treatment includes statins. The current treatment provides mild improvement of lipids. Compliance problems include adherence to diet and adherence to exercise.  Risk factors for coronary artery disease include diabetes mellitus, dyslipidemia, hypertension, male sex, obesity, a sedentary lifestyle and family history.  Hypertension This is a chronic problem. The current episode started more than 1 year ago. The problem is unchanged. The problem is uncontrolled. Pertinent negatives include no chest pain, headaches, neck pain, palpitations or shortness of breath. There are no associated agents to hypertension. Risk factors for coronary artery disease include dyslipidemia, diabetes mellitus, male gender, obesity and smoking/tobacco exposure. Past treatments include angiotensin blockers, diuretics, beta blockers and central alpha agonists. The current treatment provides mild improvement.  There are no compliance problems.  Hypertensive end-organ damage includes kidney disease. Identifiable causes of hypertension include chronic renal disease.    Review of systems  Constitutional: + Minimally fluctuating body weight,  current Body mass index is 36.52 kg/m. , no fatigue, no subjective hyperthermia, no subjective hypothermia Eyes: no blurry vision, no xerophthalmia ENT: no sore throat, no nodules palpated in throat, no dysphagia/odynophagia, no hoarseness Cardiovascular: no chest pain, no shortness of breath, no palpitations, no leg swelling Respiratory: no cough, no shortness of breath Gastrointestinal: no nausea/vomiting/diarrhea Musculoskeletal: no muscle/joint aches Skin: no rashes, no hyperemia, brittle nails Neurological: no tremors, no numbness, no tingling, no dizziness Psychiatric: no depression, no anxiety  Objective:    BP (!) 187/88 (BP Location: Left Arm, Patient Position: Sitting)   Pulse (!) 54   Ht 5' 8"  (1.727 m)   Wt 240 lb 3.2 oz (109 kg)   BMI 36.52 kg/m   Wt Readings from Last 3 Encounters:  07/23/20 240 lb 3.2 oz (109 kg)  03/20/20 240 lb 6.4 oz (109 kg)  02/05/20 243 lb 6.4 oz (110.4 kg)    BP Readings from Last 3 Encounters:  07/23/20 (!) 187/88  03/20/20 (!) 165/77  12/19/19 (!) 171/78    Physical Exam- Limited  Constitutional:  Body mass index is 36.52 kg/m. , not in acute distress, normal state of mind Eyes:  EOMI, no exophthalmos Neck: Supple Thyroid: No gross goiter Cardiovascular: RRR, no murmers, rubs, or gallops, no edema Respiratory: Adequate breathing efforts, no crackles, rales, rhonchi, or wheezing Musculoskeletal: no gross deformities, strength intact in all four extremities, no gross restriction of joint movements Skin:  no rashes, no hyperemia, brittle nails Neurological: no tremor with outstretched hands   POCT ABI Results 07/23/20   Right ABI:  1.08      Left ABI:  1.03  Right leg systolic / diastolic: 272/536  mmHg Left leg systolic / diastolic: 644/03 mmHg  Arm systolic / diastolic: 474/25 mmHG  Detailed report will be scanned into patient chart.  CMP ( most recent) CMP     Component Value Date/Time   NA 139 07/17/2020 0953   K 3.6 07/17/2020 0953   CL 100 07/17/2020 0953   CO2 25 07/17/2020 0953   GLUCOSE 108 (H) 07/17/2020 0953   GLUCOSE 230 (H) 03/18/2020 1321   BUN 20 07/17/2020 0953   CREATININE 1.23 07/17/2020 0953   CREATININE 1.00 03/18/2020 1321   CALCIUM 9.6 07/17/2020 0953  PROT 6.9 07/17/2020 0953   ALBUMIN 4.3 07/17/2020 0953   AST 13 07/17/2020 0953   ALT 7 07/17/2020 0953   ALKPHOS 81 07/17/2020 0953   BILITOT 0.3 07/17/2020 0953   GFRNONAA 58 (L) 07/17/2020 0953   GFRNONAA 74 03/18/2020 1321   GFRAA 67 07/17/2020 0953   GFRAA 86 03/18/2020 1321    Diabetic Labs (most recent): Lab Results  Component Value Date   HGBA1C 9.6 (H) 07/17/2020   HGBA1C 8.8 (A) 03/20/2020   HGBA1C 8.5 10/10/2019     Assessment & Plan:   1) Uncontrolled type 2 diabetes mellitus with hyperglycemia ([place])  - Gerald Evans has currently uncontrolled symptomatic type 2 DM since  75 years of age.  He presents today with his meter, no logs, still showing inconsistent monitoring pattern.  He has only been checking his glucose 1 or 2 times a day.  He has been missing opportunities to inject insulin due to lack of glucose monitoring.  His previsit A1c was 9.6%, worsening from last visit of 8.8%.  He denies any s/s of hypoglycemia.  Analysis of his meter shows 7-day average of 169 (with 9 readings); 14-day average of 160 (with 25 readings), and 30-day average of 166 (with 57 readings).  His PCP increased his Metformin to 1000 mg po twice daily since last visit with me.   Recent labs reviewed.  - I had a long discussion with him about the progressive nature of diabetes and the pathology behind its complications.  -his diabetes is complicated by obesity/sedentary life and he remains at a  high risk for more acute and chronic complications which include CAD, CVA, CKD, retinopathy, and neuropathy. These are all discussed in detail with him.  - Nutritional counseling repeated at each appointment due to patients tendency to fall back in to old habits.  - The patient admits there is a room for improvement in their diet and drink choices. -  Suggestion is made for the patient to avoid simple carbohydrates from their diet including Cakes, Sweet Desserts / Pastries, Ice Cream, Soda (diet and regular), Sweet Tea, Candies, Chips, Cookies, Sweet Pastries,  Store Bought Juices, Alcohol in Excess of  1-2 drinks a day, Artificial Sweeteners, Coffee Creamer, and "Sugar-free" Products. This will help patient to have stable blood glucose profile and potentially avoid unintended weight gain.   - I encouraged the patient to switch to  unprocessed or minimally processed complex starch and increased protein intake (animal or plant source), fruits, and vegetables.   - Patient is advised to stick to a routine mealtimes to eat 3 meals  a day and avoid unnecessary snacks ( to snack only to correct hypoglycemia).  - I have approached him with the following individualized plan to manage  his diabetes and patient agrees:   - he will continue to require insulin treatment in order for him to achieve and maintain control of diabetes to target.  He may benefit from changing to more simplified regimen with premixed insulin twice daily.  He still has Levemir left, and would like to use up his supply before switching.  -Given his worsening glycemic profile, he could tolerate increase in his insulin, but for safety reasons, only minor adjustments will be made.  He is advised to continue Levemir 60 units SQ daily at bedtime, increase his Novolog to 7-10 units TID with meals if glucose is above 90 and he is eating.  Specific instructions on how to titrate insulin dose based on glucose readings  given to patient in writing.   He is advised to continue Metformin 1000 mg po twice daily with meals.  -He is encouraged to do better monitoring his blood glucose.  He is urged to monitor sugar at least 4 times per day, before meals and at bedtime and call the clinic if readings are less than 70 or greater than 300 for 3 tests in a row.  He would benefit greatly from CGM device.  Sample Dexcom G6 given from office at last visit (he never used it).  He will be considered for incretin therapy as needed on subsequent visits.  - Specific targets for  A1c;  LDL, HDL,  and Triglycerides were discussed with the patient.  2) Blood Pressure /Hypertension:  His blood pressure is not controlled to target.  He had not long taken his medication prior to his appointment.  He is advised to continue Clonidine 0.2 mg po twice daily, HCTZ 25 mg po daily, Losartan 100 mg po daily, and Metoprolol 100 mg po twice daily.  He reports he attended a funeral yesterday for a long-time friend which may be impacting his BP level today.  Will not make any changes to medication today.  He is encouraged to start monitoring BP at home and let me know if his BP remains above 140/90 consistently.  3) Lipids/Hyperlipidemia:  His most recent lipid panel from 03/18/20 shows controlled LDL of 74.  He is advised to continue Simvastatin 20 mg po daily at bedtime.  Side effects and precautions discussed with him.    4)  Weight/Diet:  His Body mass index is 36.52 kg/m.  -   clearly complicating his diabetes care.   he is  a candidate for weight loss. I discussed with him the fact that loss of 5 - 10% of his  current body weight will have the most impact on his diabetes management.  Exercise, and detailed carbohydrates information provided  -  detailed on discharge instructions.  5) Chronic Care/Health Maintenance: -he  is on ACEI/ARB and Statin medications and  is encouraged to initiate and continue to follow up with Ophthalmology, Dentist,  Podiatrist at least yearly or  according to recommendations, and advised to  stay away from smoking. I have recommended yearly flu vaccine and pneumonia vaccine at least every 5 years; moderate intensity exercise for up to 150 minutes weekly; and  sleep for at least 7 hours a day.  - he is  advised to maintain close follow up with Rosita Fire, MD for primary care needs, as well as his other providers for optimal and coordinated care.   - Time spent on this patient care encounter:  35 min, of which > 50% was spent in  counseling and the rest reviewing his blood glucose logs , discussing his hypoglycemia and hyperglycemia episodes, reviewing his current and  previous labs / studies  ( including abstraction from other facilities) and medications  doses and developing a  long term treatment plan and documenting his care.   Please refer to Patient Instructions for Blood Glucose Monitoring and Insulin/Medications Dosing Guide"  in media tab for additional information. Please  also refer to " Patient Self Inventory" in the Media  tab for reviewed elements of pertinent patient history.  Gerald Evans participated in the discussions, expressed understanding, and voiced agreement with the above plans.  All questions were answered to his satisfaction. he is encouraged to contact clinic should he have any questions or concerns prior to his return  visit.  Follow up plan: - Return in about 3 months (around 10/21/2020) for Diabetes follow up with A1c in office, No previsit labs, Bring glucometer and logs.  Rayetta Pigg, [facility] [address] [place], [place] [address] Phone: [phone] Fax: [phone]  07/23/2020, 10:21 AM

## 2020-07-24 ENCOUNTER — Telehealth: Payer: Self-pay

## 2020-07-24 ENCOUNTER — Other Ambulatory Visit: Payer: Self-pay

## 2020-07-24 DIAGNOSIS — E1165 Type 2 diabetes mellitus with hyperglycemia: Secondary | ICD-10-CM

## 2020-07-24 MED ORDER — NOVOLOG FLEXPEN 100 UNIT/ML ~~LOC~~ SOPN: 7.0000 [IU] | PEN_INJECTOR | Freq: Three times a day (TID) | SUBCUTANEOUS | 0 refills | Status: DC

## 2020-07-24 MED ORDER — NOVOLIN 70/30 FLEXPEN (70-30) 100 UNIT/ML ~~LOC~~ SUPN: 24.0000 [IU] | PEN_INJECTOR | Freq: Two times a day (BID) | SUBCUTANEOUS | 3 refills | Status: DC

## 2020-07-24 NOTE — Addendum Note (Signed)
Addended by: Brita Romp on: 07/24/2020 03:26 PM   Modules accepted: Orders

## 2020-07-24 NOTE — Telephone Encounter (Signed)
Patient called and stated that the Novolog was going to be $94 to fill, wants you to send in for the 70/30 insulin instead. Please advise.

## 2020-07-24 NOTE — Telephone Encounter (Signed)
Called patient and advised, he verbalized understanding

## 2020-08-05 ENCOUNTER — Other Ambulatory Visit: Payer: Medicare HMO

## 2020-08-05 ENCOUNTER — Other Ambulatory Visit: Payer: Self-pay

## 2020-08-05 DIAGNOSIS — Z20822 Contact with and (suspected) exposure to covid-19: Secondary | ICD-10-CM

## 2020-08-06 LAB — SARS-COV-2, NAA 2 DAY TAT

## 2020-08-06 LAB — NOVEL CORONAVIRUS, NAA: SARS-CoV-2, NAA: NOT DETECTED

## 2020-08-21 ENCOUNTER — Other Ambulatory Visit: Payer: Self-pay

## 2020-08-21 ENCOUNTER — Emergency Department (HOSPITAL_COMMUNITY): Payer: Medicare HMO

## 2020-08-21 ENCOUNTER — Encounter (HOSPITAL_COMMUNITY): Payer: Self-pay

## 2020-08-21 ENCOUNTER — Emergency Department (HOSPITAL_COMMUNITY)
Admission: EM | Admit: 2020-08-21 | Discharge: 2020-08-21 | Disposition: A | Discharge status: Home / Self Care | Payer: Medicare HMO | Attending: Emergency Medicine | Admitting: Emergency Medicine

## 2020-08-21 DIAGNOSIS — I2510 Atherosclerotic heart disease of native coronary artery without angina pectoris: Secondary | ICD-10-CM | POA: Diagnosis not present

## 2020-08-21 DIAGNOSIS — E119 Type 2 diabetes mellitus without complications: Secondary | ICD-10-CM | POA: Insufficient documentation

## 2020-08-21 DIAGNOSIS — Z79899 Other long term (current) drug therapy: Secondary | ICD-10-CM | POA: Insufficient documentation

## 2020-08-21 DIAGNOSIS — Z7984 Long term (current) use of oral hypoglycemic drugs: Secondary | ICD-10-CM | POA: Diagnosis not present

## 2020-08-21 DIAGNOSIS — Z87891 Personal history of nicotine dependence: Secondary | ICD-10-CM | POA: Diagnosis not present

## 2020-08-21 DIAGNOSIS — R300 Dysuria: Secondary | ICD-10-CM | POA: Diagnosis present

## 2020-08-21 DIAGNOSIS — Z7982 Long term (current) use of aspirin: Secondary | ICD-10-CM | POA: Insufficient documentation

## 2020-08-21 DIAGNOSIS — Z794 Long term (current) use of insulin: Secondary | ICD-10-CM | POA: Diagnosis not present

## 2020-08-21 DIAGNOSIS — I10 Essential (primary) hypertension: Secondary | ICD-10-CM | POA: Insufficient documentation

## 2020-08-21 DIAGNOSIS — N3001 Acute cystitis with hematuria: Secondary | ICD-10-CM | POA: Diagnosis not present

## 2020-08-21 LAB — CBC WITH DIFFERENTIAL/PLATELET
Abs Immature Granulocytes: 0.16 10*3/uL — ABNORMAL HIGH (ref 0.00–0.07)
Basophils Absolute: 0 10*3/uL (ref 0.0–0.1)
Basophils Relative: 0 %
Eosinophils Absolute: 0 10*3/uL (ref 0.0–0.5)
Eosinophils Relative: 0 %
HCT: 35.7 % — ABNORMAL LOW (ref 39.0–52.0)
Hemoglobin: 11.8 g/dL — ABNORMAL LOW (ref 13.0–17.0)
Immature Granulocytes: 1 %
Lymphocytes Relative: 20 %
Lymphs Abs: 2.8 10*3/uL (ref 0.7–4.0)
MCH: 24.3 pg — ABNORMAL LOW (ref 26.0–34.0)
MCHC: 33.1 g/dL (ref 30.0–36.0)
MCV: 73.6 fL — ABNORMAL LOW (ref 80.0–100.0)
Monocytes Absolute: 0.9 10*3/uL (ref 0.1–1.0)
Monocytes Relative: 7 %
Neutro Abs: 9.7 10*3/uL — ABNORMAL HIGH (ref 1.7–7.7)
Neutrophils Relative %: 72 %
Platelets: 198 10*3/uL (ref 150–400)
RBC: 4.85 MIL/uL (ref 4.22–5.81)
RDW: 15.3 % (ref 11.5–15.5)
WBC: 13.6 10*3/uL — ABNORMAL HIGH (ref 4.0–10.5)
nRBC: 0 % (ref 0.0–0.2)

## 2020-08-21 LAB — URINALYSIS, ROUTINE W REFLEX MICROSCOPIC
Bilirubin Urine: NEGATIVE
Glucose, UA: >=500 mg/dL — AB
Ketones, ur: 20 mg/dL — AB
Nitrite: NEGATIVE
Protein, ur: >=300 mg/dL — AB
RBC / HPF: >50 RBC/hpf — ABNORMAL HIGH (ref 0–5)
Specific Gravity, Urine: 1.025 (ref 1.005–1.030)
WBC, UA: >50 WBC/hpf — ABNORMAL HIGH (ref 0–5)
pH: 6 (ref 5.0–8.0)

## 2020-08-21 LAB — COMPREHENSIVE METABOLIC PANEL
ALT: 11 U/L (ref 0–44)
AST: 13 U/L — ABNORMAL LOW (ref 15–41)
Albumin: 3.6 g/dL (ref 3.5–5.0)
Alkaline Phosphatase: 69 U/L (ref 38–126)
Anion gap: 11 (ref 5–15)
BUN: 9 mg/dL (ref 8–23)
CO2: 23 mmol/L (ref 22–32)
Calcium: 8.5 mg/dL — ABNORMAL LOW (ref 8.9–10.3)
Chloride: 100 mmol/L (ref 98–111)
Creatinine, Ser: 1.02 mg/dL (ref 0.61–1.24)
GFR, Estimated: >60 mL/min (ref >60)
Glucose, Bld: 234 mg/dL — ABNORMAL HIGH (ref 70–99)
Potassium: 3.2 mmol/L — ABNORMAL LOW (ref 3.5–5.1)
Sodium: 134 mmol/L — ABNORMAL LOW (ref 135–145)
Total Bilirubin: 0.6 mg/dL (ref 0.3–1.2)
Total Protein: 7.3 g/dL (ref 6.5–8.1)

## 2020-08-21 LAB — CBG MONITORING, ED: Glucose-Capillary: 220 mg/dL — ABNORMAL HIGH (ref 70–99)

## 2020-08-21 MED ORDER — CEPHALEXIN 500 MG PO CAPS: 500.0000 mg | ORAL_CAPSULE | Freq: Four times a day (QID) | ORAL | 0 refills | Status: AC

## 2020-08-21 MED ORDER — SODIUM CHLORIDE 0.9 % IV SOLN
1.0000 g | Freq: Once | INTRAVENOUS | Status: AC
  Administered 2020-08-21: 1 g via INTRAVENOUS
  Filled 2020-08-21: qty 10

## 2020-08-21 MED ORDER — POTASSIUM CHLORIDE CRYS ER 20 MEQ PO TBCR
40.0000 meq | EXTENDED_RELEASE_TABLET | Freq: Once | ORAL | Status: AC
  Administered 2020-08-21: 40 meq via ORAL
  Filled 2020-08-21: qty 2

## 2020-08-21 NOTE — Discharge Instructions (Addendum)
You are seen today for urinary tract infection, please follow-up with your primary care, kick stick to your appointment on Friday.  Take the antibiotics as directed.  Have any new worsening concerning symptoms please come back to the emergency department.  Please speak to your pharmacist about any new medications prescribed in regards to side effects or interactions with the medications.  Your blood pressure is also elevated today, please follow-up with your PCP about this as well.  Get help right away if: You have severe pain in your back or your lower abdomen. You have a fever or chills. You have nausea or vomiting.

## 2020-08-21 NOTE — ED Provider Notes (Signed)
[facility] Provider Note   CSN: [id number] Arrival date & time: 08/21/20  1255     History Chief Complaint  Patient presents with  . Hematuria    Gerald Evans is a 75 y.o. male with past medical history of diabetes, hypercholesteremia, hypertension that presents emergency department today for dysuria and hematuria and urinary frequency.  Patient states this started yesterday.  Denies any difficulty getting urine out, no urinary retention.  Denies any urological procedures or abdominal procedures.  He states that he does not have any back pain, does have some suprapubic pain, mild has not taken anything for this.  Has never had a UTI before.  Denies any fevers or chills.  Denies any cough or myalgias.  States that he did have an abdominal surgery, ventral hernia repair over 20 years ago, no other abdominal surgeries.  Denies any sick contacts.  Denies any penile discharge, scrotal swelling or testicular pain.  Denies any back trauma.  Denies any bowel incontinence, urinary incontinence, paresthesias  No other complaints at this time.  States that he is generally healthy, lives at home with his wife.  HPI     Past Medical History:  Diagnosis Date  . Colon polyps   . Diabetes mellitus without complication ([place])   . Fatty liver   . Hypercholesteremia   . Hypertension     Patient Active Problem List   Diagnosis Date Noted  . Uncontrolled type 2 diabetes mellitus with hyperglycemia ([place]) 12/19/2019  . Fatty liver   . History of colonic polyps 05/13/2017  . DIAB W/HYPEROSMOLARITY TYPE II/UNS TYPE UNCNTRL 04/21/2010  . PURE HYPERCHOLESTEROLEMIA 04/21/2010  . Mixed hyperlipidemia 04/21/2010  . HYPOPOTASSEMIA 04/21/2010  . Essential hypertension, benign 04/21/2010  . CORONARY ATHEROSCLEROSIS NATIVE CORONARY ARTERY 04/21/2010  . PERS HX NONCOMPLIANCE W/MED TX PRS HAZARDS HLTH 04/21/2010  . TOBACCO ABUSE, HX OF 04/21/2010    Past Surgical History:  Procedure  Laterality Date  . COLONOSCOPY    . COLONOSCOPY  04/21/2012   Procedure: COLONOSCOPY;  Surgeon: Rogene Houston, MD;  Location: [facility];  Service: Endoscopy;  Laterality: N/A;  1200  . COLONOSCOPY N/A 09/09/2017   Procedure: COLONOSCOPY;  Surgeon: Rogene Houston, MD;  Location: [facility];  Service: Endoscopy;  Laterality: N/A;  1200-rescheduled to 3/14/ @12 :00pm per Lelon Frohlich  . HERNIA REPAIR    . POLYPECTOMY  09/09/2017   Procedure: POLYPECTOMY;  Surgeon: Rogene Houston, MD;  Location: [facility];  Service: Endoscopy;;  Transverse Colon (CB)       Family History  Problem Relation Age of Onset  . Hypertension Mother   . Hypertension Father   . Diabetes Sister   . Hypertension Sister   . Healthy Son   . Healthy Daughter   . Colon cancer Neg Hx     Social History   Tobacco Use  . Smoking status: Former Smoker    Packs/day: 0.50    Years: 20.00    Pack years: 10.00    Types: Cigarettes  . Smokeless tobacco: Never Used  Vaping Use  . Vaping Use: Never used  Substance Use Topics  . Alcohol use: Yes    Alcohol/week: 4.0 standard drinks    Types: 4 Standard drinks or equivalent per week  . Drug use: No    Home Medications Prior to Admission medications   Medication Sig Start Date End Date Taking? Authorizing Provider  cephALEXin (KEFLEX) 500 MG capsule Take 1 capsule (500 mg total)  by mouth 4 (four) times daily for 7 days. 08/21/20 08/28/20 Yes Alfredia Client, PA-C  aspirin EC 81 MG tablet Take 1 tablet (81 mg total) by mouth daily. 09/10/17   Rogene Houston, MD  blood glucose meter kit and supplies Dispense based on patient and insurance preference. Use up to four times daily as directed. (FOR ICD-10 E11.65) 11/29/19   Cassandria Anger, MD  Blood Glucose Monitoring Suppl (ACCU-CHEK GUIDE) w/Device KIT 1 Piece by Does not apply route as directed. 11/22/19   Cassandria Anger, MD  cloNIDine (CATAPRES) 0.2 MG tablet Take 1 tablet by mouth twice daily 04/18/20    Nida, Marella Chimes, MD  Continuous Blood Gluc Receiver (DEXCOM G6 RECEIVER) DEVI 1 Piece by Does not apply route as needed. Patient not taking: Reported on 07/23/2020 03/20/20   Brita Romp, NP  glucose blood (ACCU-CHEK GUIDE) test strip Use as instructed to check blood glucose four times daily. 11/23/19   Cassandria Anger, MD  glucose blood test strip 1 each by Other route in the morning, at noon, in the evening, and at bedtime. Use as instructed qid. One touch ultra blue 2. E11.65    [provider]  hydrochlorothiazide (HYDRODIURIL) 25 MG tablet Take 25 mg by mouth daily.    [provider]  insulin isophane & regular human (NOVOLIN 70/30 FLEXPEN) (70-30) 100 UNIT/ML KwikPen Inject 24 Units into the skin 2 (two) times daily with a meal. Inject 24 units with breakfast and 24 units with supper if glucose is above 90 and you are eating. 07/24/20   Brita Romp, NP  Lancets [facility] DELICA PLUS [id number]) MISC Use as instructed to test blood glucose four times daily 06/18/20   Cassandria Anger, MD  losartan (COZAAR) 100 MG tablet Take 100 mg by mouth daily.    [provider]  metFORMIN (GLUCOPHAGE) 1000 MG tablet Take 1,000 mg by mouth 2 (two) times daily. 09/22/19   [provider]  metoprolol (LOPRESSOR) 100 MG tablet Take 100 mg by mouth 2 (two) times daily.     [provider]  ONETOUCH ULTRA test strip USE 1 STRIP TO CHECK GLUCOSE 4 TIMES DAILY AS DIRECTED 02/16/20   Cassandria Anger, MD  sildenafil (VIAGRA) 100 MG tablet Take 100 mg by mouth daily as needed. 08/21/19   [provider]  simvastatin (ZOCOR) 20 MG tablet Take 20 mg by mouth daily.    [provider]    Allergies    Lisinopril  Review of Systems   Review of Systems  Constitutional: Negative for chills, diaphoresis, fatigue and fever.  HENT: Negative for congestion, sore throat and trouble swallowing.   Eyes: Negative for pain and visual  disturbance.  Respiratory: Negative for cough, shortness of breath and wheezing.   Cardiovascular: Negative for chest pain, palpitations and leg swelling.  Gastrointestinal: Negative for abdominal distention, abdominal pain, diarrhea, nausea and vomiting.  Genitourinary: Positive for dysuria, frequency and hematuria. Negative for decreased urine volume, difficulty urinating, enuresis and flank pain.  Musculoskeletal: Negative for back pain, neck pain and neck stiffness.  Skin: Negative for pallor.  Neurological: Negative for dizziness, speech difficulty, weakness and headaches.  Psychiatric/Behavioral: Negative for confusion.    Physical Exam Updated Vital Signs BP (!) 182/92   Pulse (!) 102   Temp 99 F (37.2 C) (Oral)   Resp 18   Ht 5' 8"  (1.727 m)   Wt 106.1 kg   SpO2 97%   BMI 35.58  kg/m   Physical Exam Constitutional:      General: He is not in acute distress.    Appearance: Normal appearance. He is not ill-appearing, toxic-appearing or diaphoretic.  HENT:     Mouth/Throat:     Mouth: Mucous membranes are moist.     Pharynx: Oropharynx is clear.  Eyes:     General: No scleral icterus.    Extraocular Movements: Extraocular movements intact.     Pupils: Pupils are equal, round, and reactive to light.  Cardiovascular:     Rate and Rhythm: Normal rate and regular rhythm.     Pulses: Normal pulses.     Heart sounds: Normal heart sounds.  Pulmonary:     Effort: Pulmonary effort is normal. No respiratory distress.     Breath sounds: Normal breath sounds. No stridor. No wheezing, rhonchi or rales.  Chest:     Chest wall: No tenderness.  Abdominal:     General: Abdomen is flat. There is no distension.     Palpations: Abdomen is soft.     Tenderness: There is no abdominal tenderness. There is no right CVA tenderness, left CVA tenderness, guarding or rebound.     Comments: No CVA tenderness   Musculoskeletal:        General: No swelling or tenderness. Normal range of  motion.     Cervical back: Normal range of motion and neck supple. No rigidity.     Right lower leg: No edema.     Left lower leg: No edema.  Skin:    General: Skin is warm and dry.     Capillary Refill: Capillary refill takes less than 2 seconds.     Coloration: Skin is not pale.  Neurological:     General: No focal deficit present.     Mental Status: He is alert and oriented to person, place, and time.  Psychiatric:        Mood and Affect: Mood normal.        Behavior: Behavior normal.     ED Results / Procedures / Treatments   Labs (all labs ordered are listed, but only abnormal results are displayed) Labs Reviewed  URINALYSIS, ROUTINE W REFLEX MICROSCOPIC - Abnormal; Notable for the following components:      Result Value   APPearance CLOUDY (*)    Glucose, UA >=500 (*)    Hgb urine dipstick LARGE (*)    Ketones, ur 20 (*)    Protein, ur >=300 (*)    Leukocytes,Ua MODERATE (*)    RBC / HPF >50 (*)    WBC, UA >50 (*)    Bacteria, UA RARE (*)    All other components within normal limits  CBC WITH DIFFERENTIAL/PLATELET - Abnormal; Notable for the following components:   WBC 13.6 (*)    Hemoglobin 11.8 (*)    HCT 35.7 (*)    MCV 73.6 (*)    MCH 24.3 (*)    Neutro Abs 9.7 (*)    Abs Immature Granulocytes 0.16 (*)    All other components within normal limits  COMPREHENSIVE METABOLIC PANEL - Abnormal; Notable for the following components:   Sodium 134 (*)    Potassium 3.2 (*)    Glucose, Bld 234 (*)    Calcium 8.5 (*)    AST 13 (*)    All other components within normal limits  CBG MONITORING, ED - Abnormal; Notable for the following components:   Glucose-Capillary 220 (*)    All other components within normal  limits  URINE CULTURE  CBC WITH DIFFERENTIAL/PLATELET    EKG None  Radiology CT Renal Stone Study  Result Date: 08/21/2020 CLINICAL DATA:  Hematuria painful urination EXAM: CT ABDOMEN AND PELVIS WITHOUT CONTRAST TECHNIQUE: Multidetector CT imaging of  the abdomen and pelvis was performed following the standard protocol without IV contrast. COMPARISON:  Ultrasound 07/20/2019 FINDINGS: Lower chest: Lung bases demonstrate no acute consolidation or pleural effusion. Mild cardiomegaly. Small hiatal hernia. Hepatobiliary: 1.4 cm cyst in the left hepatic lobe. No calcified gallstone or biliary dilatation Pancreas: Unremarkable. No pancreatic ductal dilatation or surrounding inflammatory changes. Spleen: Normal in size without focal abnormality. Adrenals/Urinary Tract: Adrenal glands are normal. Nonspecific perinephric fat stranding. Kidneys show no hydronephrosis. Thick-walled appearing urinary bladder with perivesical inflammatory change. No calcified stones within the kidneys or ureters. Stomach/Bowel: Stomach is within normal limits. Appendix appears normal. No evidence of bowel wall thickening, distention, or inflammatory changes. Mild diverticular disease of left colon without acute inflammatory process. Vascular/Lymphatic: Advanced aortic atherosclerosis. 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 No suspicious nodes. Reproductive: Slightly enlarged prostate Other: Negative for free air or free fluid. Small fat containing inguinal hernias. Prior ventral hernia repair. Musculoskeletal: No acute or suspicious osseous abnormality. Degenerative changes of the lumbar spine. IMPRESSION: 1. Negative for hydronephrosis or ureteral stone. 2. Thick-walled appearing urinary bladder with perivesical inflammatory change, suspicious for cystitis. 3. Mild diverticular disease of the left colon without acute inflammatory process Aortic Atherosclerosis (ICD10-I70.0). Electronically Signed   By: Donavan Foil M.D.   On: 08/21/2020 16:34    Procedures Procedures   Medications Ordered in ED Medications  cefTRIAXone (ROCEPHIN) 1 g in sodium chloride 0.9 % 100 mL IVPB (1 g Intravenous New Bag/Given 08/21/20 1706)  potassium chloride SA (KLOR-CON) CR  tablet 40 mEq (40 mEq Oral Given 08/21/20 1704)    ED Course  I have reviewed the triage vital signs and the nursing notes.  Pertinent labs & imaging results that were available during my care of the patient were reviewed by me and considered in my medical decision making (see chart for details).    MDM Rules/Calculators/A&P                          Gerald Evans is a 75 y.o. male with past medical history of diabetes, hypercholesteremia, hypertension that presents emergency department today for dysuria and hematuria and urinary frequency.  Patient is a healthy 75 year old male, will obtain basic labs for kidney function and urinalysis at this time.   Work-up today shows small leukocytosis of 13.6, hemoglobin of 11.8.  A year ago patient's hemoglobin was 12.  CMP with potassium of 3.2, did replete here today.  Glucose 234, patient states that he did not take his insulin this morning.  No anion gap.  Urinalysis does show signs of infection with hemoglobin.  Will obtain CT renal study due to large hemoglobin in urine.  CT renal negative, does show cystitis as predicted.  Patient is stable, will give a gram of Rocephin here today and have patient follow-up with PCP.  Patient and wife agreeable for plan.  Patient's blood pressure elevated to 172/83, patient has not taken her hypertensive medications nor insulin today.  Do not think this is hypertensive urgency, no hydronephrosis, Creatnine normal, however patient will follow up with PCP.  Doubt need for further emergent work up at this time. I explained the diagnosis and have given explicit precautions to  return to the ER including for any other new or worsening symptoms. The patient understands and accepts the medical plan as it's been dictated and I have answered their questions. Discharge instructions concerning home care and prescriptions have been given. The patient is STABLE and is discharged to home in good condition.  I discussed this  case with my attending physician who cosigned this note including patient's presenting symptoms, physical exam, and planned diagnostics and interventions. Attending physician stated agreement with plan or made changes to plan which were implemented.    Final Clinical Impression(s) / ED Diagnoses hemoglobin Final diagnoses:  Acute cystitis with hematuria    Rx / DC Orders ED Discharge Orders         Ordered    cephALEXin (KEFLEX) 500 MG capsule  4 times daily        08/21/20 1718    Urine Culture  Status:  Canceled        08/21/20 [place], Jamarrius Salay, PA-C 08/21/20 1730    Horton, Alvin Critchley, DO 08/21/20 2358

## 2020-08-21 NOTE — ED Triage Notes (Signed)
Pt to er, pt states that he is here for some hematuria, states that it started yesterday, states that it hurts to urinate, pt states that he is urinating more often than normal.  Denies any recent surgeries.

## 2020-08-24 LAB — URINE CULTURE: Culture: >=100000 — AB

## 2020-08-26 ENCOUNTER — Telehealth: Payer: Self-pay | Admitting: Nurse Practitioner

## 2020-08-26 NOTE — Telephone Encounter (Signed)
Pt called in regards to high readings.   2/25 AM 290  2/26 AM 265. Lunch 288, PM 145  2/27 AM 219 lunch 229 PM 245

## 2020-08-26 NOTE — Telephone Encounter (Signed)
Please advise 

## 2020-08-26 NOTE — Telephone Encounter (Signed)
Is he still taking the 70/30 24 units BID?  If so, we can increase to 28 units BID for now.

## 2020-08-26 NOTE — Telephone Encounter (Signed)
Returned call to patient and advised of changes, verbalized understanding, will call with readings If they continue to be elevated or drop low.

## 2020-08-31 ENCOUNTER — Other Ambulatory Visit: Payer: Self-pay | Admitting: "Endocrinology

## 2020-09-04 NOTE — Progress Notes (Signed)
Subjective: 1. Elevated PSA   2. Personal history of urinary infection   3. BPH with urinary obstruction   4. Urgency of urination      Consult requested by Dr. Rosita Fire  Gerald Evans is a 75 yo male who is sent by Dr. Legrand Rams for the evaluation of an elevated PSA of 36 on 08/23/20.  He thinks his PSA was about 3.1 in 2021.  He had been seen in the ER on 2/23 and was found to have a e. Coli UTI.   He was treated with a 7 day course of keflex.  A CT showed changes consistent with cystitis.  He had previously seen Dr. Diona Fanti for ED. His UA is clear today.  He has a little residual initial dysuria.  He has moderate LUTS with urgency and occasional UUI. He doesn't always feel empty.  He has a good stream.  He has nocturia x 2.   He has had no prior UTI's or GU surgery.   He is a diabetic and has HTN.  ROS:  ROS  Allergies  Allergen Reactions  . Lisinopril     cough    Past Medical History:  Diagnosis Date  . Colon polyps   . Diabetes mellitus without complication ([place])   . Fatty liver   . Hypercholesteremia   . Hypertension     Past Surgical History:  Procedure Laterality Date  . COLONOSCOPY    . COLONOSCOPY  04/21/2012   Procedure: COLONOSCOPY;  Surgeon: Rogene Houston, MD;  [facility];  Service: Endoscopy;  Laterality: N/A;  1200  . COLONOSCOPY N/A 09/09/2017   Procedure: COLONOSCOPY;  Surgeon: Rogene Houston, MD;  Location: [facility];  Service: Endoscopy;  Laterality: N/A;  1200-rescheduled to 3/14/ _0 :00pm per Lelon Frohlich  . HERNIA REPAIR    . POLYPECTOMY  09/09/2017   Procedure: POLYPECTOMY;  Surgeon: Rogene Houston, MD;  Location: [facility];  Service: Endoscopy;;  Transverse Colon (CB)    Social History   Socioeconomic History  . Marital status: Married    Spouse name: Not on file  . Number of children: 2  . Years of education: Not on file  . Highest education level: Not on file  Occupational History  . Occupation: retired  Tobacco Use  .  Smoking status: Former Smoker    Packs/day: 0.50    Years: 20.00    Pack years: 10.00    Types: Cigarettes  . Smokeless tobacco: Never Used  Vaping Use  . Vaping Use: Never used  Substance and Sexual Activity  . Alcohol use: Yes    Alcohol/week: 4.0 standard drinks    Types: 4 Standard drinks or equivalent per week  . Drug use: No  . Sexual activity: Not on file  Other Topics Concern  . Not on file  Social History Narrative  . Not on file   Social Determinants of Health   Financial Resource Strain: Not on file  Food Insecurity: Not on file  Transportation Needs: Not on file  Physical Activity: Not on file  Stress: Not on file  Social Connections: Not on file  Intimate Partner Violence: Not on file    Family History  Problem Relation Age of Onset  . Hypertension Mother   . Hypertension Father   . Diabetes Sister   . Hypertension Sister   . Healthy Son   . Healthy Daughter   . Colon cancer Neg Hx     Anti-infectives: Anti-infectives (From admission,  onward)   Start     Dose/Rate Route Frequency Ordered Stop   09/05/20 0000  cephALEXin (KEFLEX) 500 MG capsule        500 mg Oral 3 times daily 09/05/20 1120 09/19/20 2359      Current Outpatient Medications  Medication Sig Dispense Refill  . aspirin EC 81 MG tablet Take 1 tablet (81 mg total) by mouth daily.    . blood glucose meter kit and supplies Dispense based on patient and insurance preference. Use up to four times daily as directed. (FOR ICD-10 E11.65) 1 each 0  . Blood Glucose Monitoring Suppl (ACCU-CHEK GUIDE) w/Device KIT 1 Piece by Does not apply route as directed. 1 kit 0  . cephALEXin (KEFLEX) 500 MG capsule Take 1 capsule (500 mg total) by mouth 3 (three) times daily for 14 days. 42 capsule 0  . cloNIDine (CATAPRES) 0.2 MG tablet Take 1 tablet by mouth twice daily 180 tablet 0  . Continuous Blood Gluc Receiver (DEXCOM G6 RECEIVER) DEVI 1 Piece by Does not apply route as needed. 1 each 0  . glucose  blood (ACCU-CHEK GUIDE) test strip Use as instructed to check blood glucose four times daily. 150 each 2  . glucose blood test strip 1 each by Other route in the morning, at noon, in the evening, and at bedtime. Use as instructed qid. One touch ultra blue 2. E11.65    . hydrochlorothiazide (HYDRODIURIL) 25 MG tablet Take 25 mg by mouth daily.    . insulin isophane & regular human (NOVOLIN 70/30 FLEXPEN) (70-30) 100 UNIT/ML KwikPen Inject 24 Units into the skin 2 (two) times daily with a meal. Inject 24 units with breakfast and 24 units with supper if glucose is above 90 and you are eating. 45 mL 3  . Lancets (ONETOUCH DELICA PLUS GLOVFI43P) MISC Use as instructed to test blood glucose four times daily 150 each 3  . losartan (COZAAR) 100 MG tablet Take 100 mg by mouth daily.    . metFORMIN (GLUCOPHAGE) 1000 MG tablet Take 1,000 mg by mouth 2 (two) times daily.    . metoprolol (LOPRESSOR) 100 MG tablet Take 100 mg by mouth 2 (two) times daily.     Glory Rosebush ULTRA test strip USE 1 STRIP TO CHECK GLUCOSE 4 TIMES DAILY AS DIRECTED 150 each 5  . sildenafil (VIAGRA) 100 MG tablet Take 100 mg by mouth daily as needed.    . simvastatin (ZOCOR) 20 MG tablet Take 20 mg by mouth daily.     No current facility-administered medications for this visit.     Objective: Vital signs in last 24 hours: BP (!) 148/77   Pulse (!) 55   Temp 98 F (36.7 C)   Ht _0  (1.727 m)   Wt 231 lb (104.8 kg)   BMI 35.12 kg/m   Intake/Output from previous day: No intake/output data recorded. Intake/Output this shift: _1 @   Physical Exam Vitals reviewed.  Constitutional:      Appearance: Normal appearance. He is obese.  Abdominal:     Palpations: Abdomen is soft.     Comments: Obese without hernia. Umbilical incision is well healed.   Genitourinary:    Comments: Uncirc phallus with adequate meatus. Scrotum with small right hydrocele.  Testes and epididymis normal. AP without lesions. NST without  mass. Prostate 2+ benign without tenderness. SV non-palpable.  Neurological:     Mental Status: He is alert.     Lab Results:  Recent Results (from the past  2160 hour(s))  Comprehensive metabolic panel     Status: Abnormal   Collection Time: 07/17/20  9:53 AM  Result Value Ref Range   Glucose 108 (H) 65 - 99 mg/dL   BUN 20 8 - 27 mg/dL   Creatinine, Ser 1.23 0.76 - 1.27 mg/dL   GFR calc non Af Amer 58 (L) >59 mL/min/1.73   GFR calc Af Amer 67 >59 mL/min/1.73    Comment: **In accordance with recommendations from the NKF-ASN Task force,**   [facility] is in the process of updating its eGFR calculation to the   2021 CKD-EPI creatinine equation that estimates kidney function   without a race variable.    BUN/Creatinine Ratio 16 10 - 24   Sodium 139 134 - 144 mmol/L   Potassium 3.6 3.5 - 5.2 mmol/L   Chloride 100 96 - 106 mmol/L   CO2 25 20 - 29 mmol/L   Calcium 9.6 8.6 - 10.2 mg/dL   Total Protein 6.9 6.0 - 8.5 g/dL   Albumin 4.3 3.7 - 4.7 g/dL   Globulin, Total 2.6 1.5 - 4.5 g/dL   Albumin/Globulin Ratio 1.7 1.2 - 2.2   Bilirubin Total 0.3 0.0 - 1.2 mg/dL   Alkaline Phosphatase 81 44 - 121 IU/L   AST 13 0 - 40 IU/L   ALT 7 0 - 44 IU/L  Hemoglobin A1c     Status: Abnormal   Collection Time: 07/17/20  9:53 AM  Result Value Ref Range   Hgb A1c MFr Bld 9.6 (H) 4.8 - 5.6 %    Comment:          Prediabetes: 5.7 - 6.4          Diabetes: >6.4          Glycemic control for adults with diabetes: <7.0    Est. average glucose Bld gHb Est-mCnc 229 mg/dL  Novel Coronavirus, NAA ([facility])     Status: None   Collection Time: 08/05/20  2:10 PM   Specimen: Nasopharyngeal(NP) swabs in vial transport medium   Nasopharynge  Screenin  Result Value Ref Range   SARS-CoV-2, NAA Not Detected Not Detected    Comment: This nucleic acid amplification test was developed and its performance characteristics determined by [employer]. Nucleic acid amplification tests include RT-PCR and TMA.  This test has not been FDA cleared or approved. This test has been authorized by FDA under an Emergency Use Authorization (EUA). This test is only authorized for the duration of time the declaration that circumstances exist justifying the authorization of the emergency use of in vitro diagnostic tests for detection of SARS-CoV-2 virus and/or diagnosis of COVID-19 infection under section 564(b)(1) of the Act, 21 U.S.C. 754GBE-0(F) (1), unless the authorization is terminated or revoked sooner. When diagnostic testing is negative, the possibility of a false negative result should be considered in the context of a patient's recent exposures and the presence of clinical signs and symptoms consistent with COVID-19. An individual without symptoms of COVID-19 and who is not shedding SARS-CoV-2 virus wo uld expect to have a negative (not detected) result in this assay.   SARS-COV-2, NAA 2 DAY TAT     Status: None   Collection Time: 08/05/20  2:10 PM   Nasopharynge  Screenin  Result Value Ref Range   SARS-CoV-2, NAA 2 DAY TAT Performed   Urinalysis, Routine w reflex microscopic Urine, Clean Catch     Status: Abnormal   Collection Time: 08/21/20  1:28 PM  Result Value Ref Range  Color, Urine YELLOW YELLOW   APPearance CLOUDY (A) CLEAR   Specific Gravity, Urine 1.025 1.005 - 1.030   pH 6.0 5.0 - 8.0   Glucose, UA >=500 (A) NEGATIVE mg/dL   Hgb urine dipstick LARGE (A) NEGATIVE   Bilirubin Urine NEGATIVE NEGATIVE   Ketones, ur 20 (A) NEGATIVE mg/dL   Protein, ur >=300 (A) NEGATIVE mg/dL   Nitrite NEGATIVE NEGATIVE   Leukocytes,Ua MODERATE (A) NEGATIVE   RBC / HPF >50 (H) 0 - 5 RBC/hpf   WBC, UA >50 (H) 0 - 5 WBC/hpf   Bacteria, UA RARE (A) NONE SEEN   WBC Clumps PRESENT    Mucus PRESENT     Comment: Performed at [facility], [address], [place], [place] [address]  CBC with Differential/Platelet     Status: Abnormal   Collection Time: 08/21/20  2:37 PM  Result Value Ref Range   WBC  13.6 (H) 4.0 - 10.5 K/uL   RBC 4.85 4.22 - 5.81 MIL/uL   Hemoglobin 11.8 (L) 13.0 - 17.0 g/dL   HCT 35.7 (L) 39.0 - 52.0 %   MCV 73.6 (L) 80.0 - 100.0 fL   MCH 24.3 (L) 26.0 - 34.0 pg   MCHC 33.1 30.0 - 36.0 g/dL   RDW 15.3 11.5 - 15.5 %   Platelets 198 150 - 400 K/uL   nRBC 0.0 0.0 - 0.2 %   Neutrophils Relative % 72 %   Neutro Abs 9.7 (H) 1.7 - 7.7 K/uL   Lymphocytes Relative 20 %   Lymphs Abs 2.8 0.7 - 4.0 K/uL   Monocytes Relative 7 %   Monocytes Absolute 0.9 0.1 - 1.0 K/uL   Eosinophils Relative 0 %   Eosinophils Absolute 0.0 0.0 - 0.5 K/uL   Basophils Relative 0 %   Basophils Absolute 0.0 0.0 - 0.1 K/uL   Immature Granulocytes 1 %   Abs Immature Granulocytes 0.16 (H) 0.00 - 0.07 K/uL    Comment: Performed at [facility], [address] [place], [place] [address]  Comprehensive metabolic panel     Status: Abnormal   Collection Time: 08/21/20  2:37 PM  Result Value Ref Range   Sodium 134 (L) 135 - 145 mmol/L   Potassium 3.2 (L) 3.5 - 5.1 mmol/L   Chloride 100 98 - 111 mmol/L   CO2 23 22 - 32 mmol/L   Glucose, Bld 234 (H) 70 - 99 mg/dL    Comment: Glucose reference range applies only to samples taken after fasting for at least 8 hours.   BUN 9 8 - 23 mg/dL   Creatinine, Ser 1.02 0.61 - 1.24 mg/dL   Calcium 8.5 (L) 8.9 - 10.3 mg/dL   Total Protein 7.3 6.5 - 8.1 g/dL   Albumin 3.6 3.5 - 5.0 g/dL   AST 13 (L) 15 - 41 U/L   ALT 11 0 - 44 U/L   Alkaline Phosphatase 69 38 - 126 U/L   Total Bilirubin 0.6 0.3 - 1.2 mg/dL   GFR, Estimated >60 >60 mL/min    Comment: (NOTE) Calculated using the CKD-EPI Creatinine Equation (2021)    Anion gap 11 5 - 15    Comment: Performed at [facility], [address] [place], [place] [address]  CBG monitoring, ED     Status: Abnormal   Collection Time: 08/21/20  2:39 PM  Result Value Ref Range   Glucose-Capillary 220 (H) 70 - 99 mg/dL    Comment: Glucose reference range applies only to samples taken after  fasting for at least 8 hours.   Urine culture     Status: Abnormal   Collection Time: 08/21/20  5:30 PM   Specimen: Urine, Clean Catch  Result Value Ref Range   Specimen Description      URINE, CLEAN CATCH Performed at [facility], [address], [place], [place] [address]    Special Requests      NONE Performed at [facility], [address] [place],  [address]    Culture >=100,000 COLONIES/mL ESCHERICHIA COLI (A)    Report Status 08/24/2020 FINAL    Organism ID, Bacteria ESCHERICHIA COLI (A)       Susceptibility   Escherichia coli - MIC*    AMPICILLIN 4 SENSITIVE Sensitive     CEFAZOLIN <=4 SENSITIVE Sensitive     CEFEPIME <=0.12 SENSITIVE Sensitive     CEFTRIAXONE <=0.25 SENSITIVE Sensitive     CIPROFLOXACIN <=0.25 SENSITIVE Sensitive     GENTAMICIN <=1 SENSITIVE Sensitive     IMIPENEM <=0.25 SENSITIVE Sensitive     NITROFURANTOIN <=16 SENSITIVE Sensitive     TRIMETH/SULFA <=20 SENSITIVE Sensitive     AMPICILLIN/SULBACTAM <=2 SENSITIVE Sensitive     PIP/TAZO <=4 SENSITIVE Sensitive     * >=100,000 COLONIES/mL ESCHERICHIA COLI  Urinalysis, Routine w reflex microscopic     Status: None   Collection Time: 09/05/20 10:44 AM  Result Value Ref Range   Specific Gravity, UA 1.015 1.005 - 1.030   pH, UA 6.0 5.0 - 7.5   Color, UA Yellow Yellow   Appearance Ur Clear Clear   Leukocytes,UA Negative Negative   Protein,UA Negative Negative/Trace   Glucose, UA Negative Negative   Ketones, UA Negative Negative   RBC, UA Negative Negative   Bilirubin, UA Negative Negative   Urobilinogen, Ur 0.2 0.2 - 1.0 mg/dL   Nitrite, UA Negative Negative   Microscopic Examination Comment     Comment: Microscopic follows if indicated.  Bladder scan     Status: None   Collection Time: 09/05/20 11:36 AM  Result Value Ref Range   Scan Result 34     Studies/Results: No results found.   Assessment/Plan: Elevated PSA.  His PSA was markedly elevated over the past year and is most consistent with a rise associated  with acute prostatitis.   I am going to give him another 2 weeks of antibiotic and have him return in a month for a PSA and in 6 weeks for f/u.   Meds ordered this encounter  Medications  . cephALEXin (KEFLEX) 500 MG capsule    Sig: Take 1 capsule (500 mg total) by mouth 3 (three) times daily for 14 days.    Dispense:  42 capsule    Refill:  0     Orders Placed This Encounter  Procedures  . Urinalysis, Routine w reflex microscopic  . PSA, total and free    Standing Status:   Future    Standing Expiration Date:   11/05/2020  . Bladder scan     Return in about 6 weeks (around 10/17/2020).    CC: Dr. Rosita Fire.      Irine Seal 09/05/2020 [phone]

## 2020-09-05 ENCOUNTER — Encounter: Payer: Self-pay | Admitting: Urology

## 2020-09-05 ENCOUNTER — Ambulatory Visit (INDEPENDENT_AMBULATORY_CARE_PROVIDER_SITE_OTHER): Payer: Medicare HMO | Admitting: Urology

## 2020-09-05 ENCOUNTER — Other Ambulatory Visit: Payer: Self-pay

## 2020-09-05 VITALS — BP 148/77 | HR 55 | Temp 98.0°F | Ht 68.0 in | Wt 231.0 lb

## 2020-09-05 DIAGNOSIS — Z87440 Personal history of urinary (tract) infections: Secondary | ICD-10-CM | POA: Diagnosis not present

## 2020-09-05 DIAGNOSIS — R3915 Urgency of urination: Secondary | ICD-10-CM | POA: Diagnosis not present

## 2020-09-05 DIAGNOSIS — R972 Elevated prostate specific antigen [PSA]: Secondary | ICD-10-CM

## 2020-09-05 DIAGNOSIS — N401 Enlarged prostate with lower urinary tract symptoms: Secondary | ICD-10-CM | POA: Diagnosis not present

## 2020-09-05 DIAGNOSIS — N138 Other obstructive and reflux uropathy: Secondary | ICD-10-CM

## 2020-09-05 LAB — URINALYSIS, ROUTINE W REFLEX MICROSCOPIC
Bilirubin, UA: NEGATIVE
Glucose, UA: NEGATIVE
Ketones, UA: NEGATIVE
Leukocytes,UA: NEGATIVE
Nitrite, UA: NEGATIVE
Protein,UA: NEGATIVE
RBC, UA: NEGATIVE
Specific Gravity, UA: 1.015 (ref 1.005–1.030)
Urobilinogen, Ur: 0.2 mg/dL (ref 0.2–1.0)
pH, UA: 6 (ref 5.0–7.5)

## 2020-09-05 LAB — BLADDER SCAN: Scan Result: 34

## 2020-09-05 MED ORDER — CEPHALEXIN 500 MG PO CAPS: 500.0000 mg | ORAL_CAPSULE | Freq: Three times a day (TID) | ORAL | 0 refills | Status: AC

## 2020-09-05 NOTE — Progress Notes (Signed)
Urological Symptom Review  Patient is experiencing the following symptoms: Burning/pain with urination Erection problems (male only)   Review of Systems  Gastrointestinal (upper)  : Negative for upper GI symptoms  Gastrointestinal (lower) : Negative for lower GI symptoms  Constitutional : Negative for symptoms  Skin: Negative for skin symptoms  Eyes: Negative for eye symptoms  Ear/Nose/Throat : Negative for Ear/Nose/Throat symptoms  Hematologic/Lymphatic: Negative for Hematologic/Lymphatic symptoms  Cardiovascular : Negative for cardiovascular symptoms  Respiratory : Negative for respiratory symptoms  Endocrine: Negative for endocrine symptoms  Musculoskeletal: Negative for musculoskeletal symptoms  Neurological: Negative for neurological symptoms  Psychologic: Negative for psychiatric symptoms

## 2020-10-17 ENCOUNTER — Other Ambulatory Visit: Payer: Self-pay

## 2020-10-17 ENCOUNTER — Other Ambulatory Visit: Payer: Medicare HMO

## 2020-10-17 DIAGNOSIS — R972 Elevated prostate specific antigen [PSA]: Secondary | ICD-10-CM

## 2020-10-18 LAB — PSA, TOTAL AND FREE
PSA, Free Pct: 26.2 %
PSA, Free: 0.68 ng/mL
Prostate Specific Ag, Serum: 2.6 ng/mL (ref 0.0–4.0)

## 2020-10-21 ENCOUNTER — Other Ambulatory Visit: Payer: Self-pay

## 2020-10-21 ENCOUNTER — Ambulatory Visit (INDEPENDENT_AMBULATORY_CARE_PROVIDER_SITE_OTHER): Payer: Medicare HMO | Admitting: Nurse Practitioner

## 2020-10-21 ENCOUNTER — Encounter: Payer: Self-pay | Admitting: Nurse Practitioner

## 2020-10-21 VITALS — BP 172/84 | HR 54 | Ht 68.0 in

## 2020-10-21 DIAGNOSIS — E782 Mixed hyperlipidemia: Secondary | ICD-10-CM

## 2020-10-21 DIAGNOSIS — I10 Essential (primary) hypertension: Secondary | ICD-10-CM | POA: Diagnosis not present

## 2020-10-21 DIAGNOSIS — E1165 Type 2 diabetes mellitus with hyperglycemia: Secondary | ICD-10-CM

## 2020-10-21 LAB — POCT GLYCOSYLATED HEMOGLOBIN (HGB A1C): HbA1c, POC (controlled diabetic range): 9.3 % — AB (ref 0.0–7.0)

## 2020-10-21 MED ORDER — NOVOLIN 70/30 FLEXPEN (70-30) 100 UNIT/ML ~~LOC~~ SUPN: 32.0000 [IU] | PEN_INJECTOR | Freq: Two times a day (BID) | SUBCUTANEOUS | 3 refills | Status: DC

## 2020-10-21 NOTE — Progress Notes (Signed)
10/21/2020, 9:31 AM  Endocrinology follow-up note   Subjective:    Patient ID: Gerald Evans, male    DOB: June 01, 1946.  Gerald Evans is being seen in follow-up after he was seen in consultation for management of currently uncontrolled symptomatic diabetes requested by  Rosita Fire, MD.   Past Medical History:  Diagnosis Date  . Colon polyps   . Diabetes mellitus without complication ([place])   . Fatty liver   . Hypercholesteremia   . Hypertension     Past Surgical History:  Procedure Laterality Date  . COLONOSCOPY    . COLONOSCOPY  04/21/2012   Procedure: COLONOSCOPY;  Surgeon: Rogene Houston, MD;  [facility];  Service: Endoscopy;  Laterality: N/A;  1200  . COLONOSCOPY N/A 09/09/2017   Procedure: COLONOSCOPY;  Surgeon: Rogene Houston, MD;  Location: [facility];  Service: Endoscopy;  Laterality: N/A;  1200-rescheduled to 3/14/ @12 :00pm per Lelon Frohlich  . HERNIA REPAIR    . POLYPECTOMY  09/09/2017   Procedure: POLYPECTOMY;  Surgeon: Rogene Houston, MD;  Location: [facility];  Service: Endoscopy;;  Transverse Colon ([facility])    Social History   Socioeconomic History  . Marital status: Married    Spouse name: Not on file  . Number of children: 2  . Years of education: Not on file  . Highest education level: Not on file  Occupational History  . Occupation: retired  Tobacco Use  . Smoking status: Former Smoker    Packs/day: 0.50    Years: 20.00    Pack years: 10.00    Types: Cigarettes  . Smokeless tobacco: Never Used  Vaping Use  . Vaping Use: Never used  Substance and Sexual Activity  . Alcohol use: Yes    Alcohol/week: 4.0 standard drinks    Types: 4 Standard drinks or equivalent per week  . Drug use: No  . Sexual activity: Not on file  Other Topics Concern  . Not on file  Social History Narrative  . Not on file   Social Determinants of Health   Financial Resource  Strain: Not on file  Food Insecurity: Not on file  Transportation Needs: Not on file  Physical Activity: Not on file  Stress: Not on file  Social Connections: Not on file    Family History  Problem Relation Age of Onset  . Hypertension Mother   . Hypertension Father   . Diabetes Sister   . Hypertension Sister   . Healthy Son   . Healthy Daughter   . Colon cancer Neg Hx     Outpatient Encounter Medications as of 10/21/2020  Medication Sig  . blood glucose meter kit and supplies Dispense based on patient and insurance preference. Use up to four times daily as directed. (FOR ICD-10 E11.65)  . cloNIDine (CATAPRES) 0.2 MG tablet Take 1 tablet by mouth twice daily  . hydrochlorothiazide (HYDRODIURIL) 25 MG tablet Take 25 mg by mouth daily.  . Lancets (ONETOUCH DELICA PLUS [id number]) MISC Use as instructed to test blood glucose four times daily  . losartan (COZAAR) 100 MG tablet Take 100 mg by mouth daily.  . metFORMIN (  GLUCOPHAGE) 1000 MG tablet Take 1,000 mg by mouth 2 (two) times daily.  . metoprolol (LOPRESSOR) 100 MG tablet Take 100 mg by mouth 2 (two) times daily.   Glory Rosebush ULTRA test strip USE 1 STRIP TO CHECK GLUCOSE 4 TIMES DAILY AS DIRECTED  . sildenafil (VIAGRA) 100 MG tablet Take 100 mg by mouth daily as needed.  . simvastatin (ZOCOR) 20 MG tablet Take 20 mg by mouth daily.  . [DISCONTINUED] aspirin EC 81 MG tablet Take 1 tablet (81 mg total) by mouth daily.  . [DISCONTINUED] glucose blood test strip 1 each by Other route in the morning, at noon, in the evening, and at bedtime. Use as instructed qid. One touch ultra blue 2. E11.65  . [DISCONTINUED] insulin isophane & regular human (NOVOLIN 70/30 FLEXPEN) (70-30) 100 UNIT/ML KwikPen Inject 24 Units into the skin 2 (two) times daily with a meal. Inject 24 units with breakfast and 24 units with supper if glucose is above 90 and you are eating. (Patient taking differently: Inject 28 Units into the skin 2 (two) times daily with a  meal. Inject 24 units with breakfast and 24 units with supper if glucose is above 90 and you are eating.)  . insulin isophane & regular human (NOVOLIN 70/30 FLEXPEN) (70-30) 100 UNIT/ML KwikPen Inject 32 Units into the skin 2 (two) times daily with a meal. Inject 24 units with breakfast and 24 units with supper if glucose is above 90 and you are eating.  . [DISCONTINUED] Blood Glucose Monitoring Suppl (ACCU-CHEK GUIDE) w/Device KIT 1 Piece by Does not apply route as directed. (Patient not taking: Reported on 10/21/2020)  . [DISCONTINUED] Continuous Blood Gluc Receiver (DEXCOM G6 RECEIVER) DEVI 1 Piece by Does not apply route as needed.  . [DISCONTINUED] glucose blood (ACCU-CHEK GUIDE) test strip Use as instructed to check blood glucose four times daily. (Patient not taking: Reported on 10/21/2020)   No facility-administered encounter medications on file as of 10/21/2020.    ALLERGIES: Allergies  Allergen Reactions  . Lisinopril     cough    VACCINATION STATUS: Immunization History  Administered Date(s) Administered  . PFIZER(Purple Top)SARS-COV-2 Vaccination 08/20/2019, 09/12/2019, 04/25/2020    Diabetes He presents for his follow-up diabetic visit. He has type 2 diabetes mellitus. Onset time: He was diagnosed at approximate age of 75 years. His disease course has been improving. There are no hypoglycemic associated symptoms. Pertinent negatives for hypoglycemia include no confusion, headaches, pallor or seizures. Pertinent negatives for diabetes include no chest pain, no fatigue, no polydipsia, no polyphagia, no polyuria and no weakness. There are no hypoglycemic complications. Symptoms are improving. Diabetic complications include impotence. Risk factors for coronary artery disease include dyslipidemia, diabetes mellitus, family history, tobacco exposure, sedentary lifestyle, male sex, obesity and hypertension. Current diabetic treatment includes oral agent (monotherapy) and insulin injections.  He is compliant with treatment most of the time. His weight is fluctuating minimally. He is following a generally unhealthy diet. When asked about meal planning, he reported none. He has not had a previous visit with a dietitian. He participates in exercise intermittently. His home blood glucose trend is decreasing steadily. His overall blood glucose range is 140-180 mg/dl. (He presents today with his meter, no logs, showing slightly improved glycemic profile (still inconsistent as far as monitoring goes).  His POCT A1c today is 9.3%, decreasing from last visit of 9.6%.  He denies any episodes of hypoglycemia.  Analysis of his meter shows 7-day average of 157, 14-day average of 175, 30-day  average of 199.  He reports he was unable to obtain CGM since last visit because he has not upgraded his phone to a compatible version as he had previously intended to do.) An ACE inhibitor/angiotensin II receptor blocker is being taken. He does not see a podiatrist.Eye exam is current.  Hyperlipidemia This is a chronic problem. The current episode started more than 1 year ago. The problem is controlled. Recent lipid tests were reviewed and are normal. Exacerbating diseases include chronic renal disease, diabetes and obesity. Factors aggravating his hyperlipidemia include fatty foods, beta blockers and thiazides. Pertinent negatives include no chest pain, myalgias or shortness of breath. Current antihyperlipidemic treatment includes statins. The current treatment provides mild improvement of lipids. Compliance problems include adherence to diet and adherence to exercise.  Risk factors for coronary artery disease include diabetes mellitus, dyslipidemia, hypertension, male sex, obesity, a sedentary lifestyle and family history.  Hypertension This is a chronic problem. The current episode started more than 1 year ago. The problem has been waxing and waning since onset. The problem is uncontrolled. Pertinent negatives include no  chest pain, headaches, neck pain, palpitations or shortness of breath. There are no associated agents to hypertension. Risk factors for coronary artery disease include dyslipidemia, diabetes mellitus, male gender, obesity and smoking/tobacco exposure. Past treatments include angiotensin blockers, diuretics, beta blockers and central alpha agonists. The current treatment provides mild improvement. There are no compliance problems.  Hypertensive end-organ damage includes kidney disease. Identifiable causes of hypertension include chronic renal disease.    Review of systems  Constitutional: + Minimally fluctuating body weight,  current Body mass index is 35.12 kg/m. , no fatigue, no subjective hyperthermia, no subjective hypothermia Eyes: no blurry vision, no xerophthalmia ENT: no sore throat, no nodules palpated in throat, no dysphagia/odynophagia, no hoarseness Cardiovascular: no chest pain, no shortness of breath, no palpitations, no leg swelling Respiratory: no cough, no shortness of breath Gastrointestinal: no nausea/vomiting/diarrhea Musculoskeletal: no muscle/joint aches Skin: no rashes, no hyperemia Neurological: no tremors, no numbness, no tingling, no dizziness Psychiatric: no depression, no anxiety  Objective:    BP (!) 172/84 (BP Location: Right Arm, Patient Position: Sitting)   Pulse (!) 54   Ht 5' 8"  (1.727 m)   BMI 35.12 kg/m   Wt Readings from Last 3 Encounters:  09/05/20 231 lb (104.8 kg)  08/21/20 234 lb (106.1 kg)  07/23/20 240 lb 3.2 oz (109 kg)    BP Readings from Last 3 Encounters:  10/21/20 (!) 172/84  09/05/20 (!) 148/77  08/21/20 (!) 182/92     Physical Exam- Limited  Constitutional:  Body mass index is 35.12 kg/m. , not in acute distress, normal state of mind Eyes:  EOMI, no exophthalmos Neck: Supple Cardiovascular: RRR, no murmers, rubs, or gallops, no edema Respiratory: Adequate breathing efforts, no crackles, rales, rhonchi, or  wheezing Musculoskeletal: no gross deformities, strength intact in all four extremities, no gross restriction of joint movements Skin:  no rashes, no hyperemia Neurological: no tremor with outstretched hands     CMP ( most recent) CMP     Component Value Date/Time   NA 134 (L) 08/21/2020 1437   NA 139 07/17/2020 0953   K 3.2 (L) 08/21/2020 1437   CL 100 08/21/2020 1437   CO2 23 08/21/2020 1437   GLUCOSE 234 (H) 08/21/2020 1437   BUN 9 08/21/2020 1437   BUN 20 07/17/2020 0953   CREATININE 1.02 08/21/2020 1437   CREATININE 1.00 03/18/2020 1321   CALCIUM 8.5 (L) 08/21/2020  1437   PROT 7.3 08/21/2020 1437   PROT 6.9 07/17/2020 0953   ALBUMIN 3.6 08/21/2020 1437   ALBUMIN 4.3 07/17/2020 0953   AST 13 (L) 08/21/2020 1437   ALT 11 08/21/2020 1437   ALKPHOS 69 08/21/2020 1437   BILITOT 0.6 08/21/2020 1437   BILITOT 0.3 07/17/2020 0953   GFRNONAA >60 08/21/2020 1437   GFRNONAA 74 03/18/2020 1321   GFRAA 67 07/17/2020 0953   GFRAA 86 03/18/2020 1321    Diabetic Labs (most recent): Lab Results  Component Value Date   HGBA1C 9.3 (A) 10/21/2020   HGBA1C 9.6 (H) 07/17/2020   HGBA1C 8.8 (A) 03/20/2020     Assessment & Plan:   1) Uncontrolled type 2 diabetes mellitus with hyperglycemia ([place])  - Gerald Evans has currently uncontrolled symptomatic type 2 DM since  75 years of age.  He presents today with his meter, no logs, showing slightly improved glycemic profile (still inconsistent as far as monitoring goes).  His POCT A1c today is 9.3%, decreasing from last visit of 9.6%.  He denies any episodes of hypoglycemia.  Analysis of his meter shows 7-day average of 157, 14-day average of 175, 30-day average of 199.  He reports he was unable to obtain CGM since last visit because he has not upgraded his phone to a compatible version as he had previously intended to do.   Recent labs reviewed.  - I had a long discussion with him about the progressive nature of diabetes and the  pathology behind its complications.  -his diabetes is complicated by obesity/sedentary life and he remains at a high risk for more acute and chronic complications which include CAD, CVA, CKD, retinopathy, and neuropathy. These are all discussed in detail with him.  - Nutritional counseling repeated at each appointment due to patients tendency to fall back in to old habits.  - The patient admits there is a room for improvement in their diet and drink choices. -  Suggestion is made for the patient to avoid simple carbohydrates from their diet including Cakes, Sweet Desserts / Pastries, Ice Cream, Soda (diet and regular), Sweet Tea, Candies, Chips, Cookies, Sweet Pastries,  Store Bought Juices, Alcohol in Excess of  1-2 drinks a day, Artificial Sweeteners, Coffee Creamer, and "Sugar-free" Products. This will help patient to have stable blood glucose profile and potentially avoid unintended weight gain.   - I encouraged the patient to switch to  unprocessed or minimally processed complex starch and increased protein intake (animal or plant source), fruits, and vegetables.   - Patient is advised to stick to a routine mealtimes to eat 3 meals  a day and avoid unnecessary snacks ( to snack only to correct hypoglycemia).  - I have approached him with the following individualized plan to manage  his diabetes and patient agrees:   - he will continue to require insulin treatment in order for him to achieve and maintain control of diabetes to target.    -He is benefiting from simplified insulin regimen with premixed insulin.  Given his fasting hyperglycemia, he will tolerate slight increase to 32 units SQ BID with breakfast and supper if glucose is above 90 and he is eating.  He can also continue Metformin 1000 mg po twice daily with meals.    -He is encouraged to do better monitoring his blood glucose.  He is urged to monitor sugar at least 3 times per day, before injecting insulin (at breakfast and supper)  and at bedtime and call the  clinic if readings are less than 70 or greater than 300 for 3 tests in a row.  He would benefit greatly from CGM device.  Will send in Rx for Dexcom with reader to local [employer] company.  He will be considered for incretin therapy as needed on subsequent visits.  - Specific targets for  A1c;  LDL, HDL,  and Triglycerides were discussed with the patient.  2) Blood Pressure /Hypertension:  His blood pressure is not controlled to target.  He had not long taken his medication prior to his appointment.  He is advised to continue Clonidine 0.2 mg po twice daily, HCTZ 25 mg po daily, Losartan 100 mg po daily, and Metoprolol 100 mg po twice daily.    3) Lipids/Hyperlipidemia:  His most recent lipid panel from 03/18/20 shows controlled LDL of 74.  He is advised to continue Simvastatin 20 mg po daily at bedtime.  Side effects and precautions discussed with him.    4)  Weight/Diet:  His Body mass index is 35.12 kg/m.  -   clearly complicating his diabetes care.   he is  a candidate for weight loss. I discussed with him the fact that loss of 5 - 10% of his  current body weight will have the most impact on his diabetes management.  Exercise, and detailed carbohydrates information provided  -  detailed on discharge instructions.  5) Chronic Care/Health Maintenance: -he  is on ACEI/ARB and Statin medications and  is encouraged to initiate and continue to follow up with Ophthalmology, Dentist,  Podiatrist at least yearly or according to recommendations, and advised to  stay away from smoking. I have recommended yearly flu vaccine and pneumonia vaccine at least every 5 years; moderate intensity exercise for up to 150 minutes weekly; and  sleep for at least 7 hours a day.  - he is  advised to maintain close follow up with Rosita Fire, MD for primary care needs, as well as his other providers for optimal and coordinated care.     I spent 40 minutes in the care of the patient today  including review of labs from [facility] [place], Lipids, Thyroid Function, Hematology (current and previous including abstractions from other facilities); face-to-face time discussing  his blood glucose readings/logs, discussing hypoglycemia and hyperglycemia episodes and symptoms, medications doses, his options of short and long term treatment based on the latest standards of care / guidelines;  discussion about incorporating lifestyle medicine;  and documenting the encounter.    Please refer to Patient Instructions for Blood Glucose Monitoring and Insulin/Medications Dosing Guide"  in media tab for additional information. Please  also refer to " Patient Self Inventory" in the Media  tab for reviewed elements of pertinent patient history.  Gerald Evans participated in the discussions, expressed understanding, and voiced agreement with the above plans.  All questions were answered to his satisfaction. he is encouraged to contact clinic should he have any questions or concerns prior to his return visit.  Follow up plan: - Return in about 3 months (around 01/20/2021) for Diabetes follow up with A1c in office, Previsit labs, Bring glucometer and logs.  Rayetta Pigg, [facility] [address] [place], [place] [address] Phone: [phone] Fax: [phone]  10/21/2020, 9:31 AM

## 2020-10-24 ENCOUNTER — Other Ambulatory Visit: Payer: Self-pay

## 2020-10-24 ENCOUNTER — Ambulatory Visit (INDEPENDENT_AMBULATORY_CARE_PROVIDER_SITE_OTHER): Payer: Medicare HMO | Admitting: Urology

## 2020-10-24 ENCOUNTER — Encounter: Payer: Self-pay | Admitting: Urology

## 2020-10-24 VITALS — BP 166/79 | HR 56 | Temp 98.5°F | Ht 68.0 in | Wt 235.0 lb

## 2020-10-24 DIAGNOSIS — R972 Elevated prostate specific antigen [PSA]: Secondary | ICD-10-CM | POA: Diagnosis not present

## 2020-10-24 DIAGNOSIS — Z87440 Personal history of urinary (tract) infections: Secondary | ICD-10-CM

## 2020-10-24 DIAGNOSIS — N401 Enlarged prostate with lower urinary tract symptoms: Secondary | ICD-10-CM | POA: Diagnosis not present

## 2020-10-24 DIAGNOSIS — N138 Other obstructive and reflux uropathy: Secondary | ICD-10-CM

## 2020-10-24 LAB — URINALYSIS, ROUTINE W REFLEX MICROSCOPIC
Bilirubin, UA: NEGATIVE
Glucose, UA: NEGATIVE
Ketones, UA: NEGATIVE
Leukocytes,UA: NEGATIVE
Nitrite, UA: NEGATIVE
Protein,UA: NEGATIVE
RBC, UA: NEGATIVE
Specific Gravity, UA: 1.01 (ref 1.005–1.030)
Urobilinogen, Ur: 0.2 mg/dL (ref 0.2–1.0)
pH, UA: 6.5 (ref 5.0–7.5)

## 2020-10-24 NOTE — Progress Notes (Signed)
Urological Symptom Review  Patient is experiencing the following symptoms: None   Review of Systems  Gastrointestinal (upper)  : Negative for upper GI symptoms  Gastrointestinal (lower) : Negative for lower GI symptoms  Constitutional : Negative for symptoms  Skin: Itching  Eyes: Negative for eye symptoms  Ear/Nose/Throat : Negative for Ear/Nose/Throat symptoms  Hematologic/Lymphatic: Negative for Hematologic/Lymphatic symptoms  Cardiovascular : Negative for cardiovascular symptoms  Respiratory : Negative for respiratory symptoms  Endocrine: Negative for endocrine symptoms  Musculoskeletal: Negative for musculoskeletal symptoms  Neurological: Negative for neurological symptoms  Psychologic: Negative for psychiatric symptoms

## 2020-10-24 NOTE — Progress Notes (Signed)
Subjective: 1. Elevated PSA   2. BPH with urinary obstruction   3. Personal history of urinary infection      10/24/20: Mr. Hoehn returns today with a repeat PSA which is down to 2.6 with a 26% f/t ratio after completing his antibiotics.   His UA is clear.     GU Hx: Mr. Bollman is a 75 yo male who is sent by Dr. Legrand Rams for the evaluation of an elevated PSA of 36 on 08/23/20.  He thinks his PSA was about 3.1 in 2021.  He had been seen in the ER on 2/23 and was found to have a e. Coli UTI.   He was treated with a 7 day course of keflex.  A CT showed changes consistent with cystitis.  He had previously seen Dr. Diona Fanti for ED. His UA is clear today.  He has a little residual initial dysuria.  He has moderate LUTS with urgency and occasional UUI. He doesn't always feel empty.  He has a good stream.  He has nocturia x 2.   He has had no prior UTI's or GU surgery.   He is a diabetic and has HTN.  ROS:  ROS  Allergies  Allergen Reactions  . Lisinopril     cough    Past Medical History:  Diagnosis Date  . Colon polyps   . Diabetes mellitus without complication ([place])   . Fatty liver   . Hypercholesteremia   . Hypertension     Past Surgical History:  Procedure Laterality Date  . COLONOSCOPY    . COLONOSCOPY  04/21/2012   Procedure: COLONOSCOPY;  Surgeon: Rogene Houston, MD;  [facility];  Service: Endoscopy;  Laterality: N/A;  1200  . COLONOSCOPY N/A 09/09/2017   Procedure: COLONOSCOPY;  Surgeon: Rogene Houston, MD;  Location: [facility];  Service: Endoscopy;  Laterality: N/A;  1200-rescheduled to 3/14/ _0 :00pm per Lelon Frohlich  . HERNIA REPAIR    . POLYPECTOMY  09/09/2017   Procedure: POLYPECTOMY;  Surgeon: Rogene Houston, MD;  Location: [facility];  Service: Endoscopy;;  Transverse Colon (CB)    Social History   Socioeconomic History  . Marital status: Married    Spouse name: Not on file  . Number of children: 2  . Years of education: Not on file  . Highest  education level: Not on file  Occupational History  . Occupation: retired  Tobacco Use  . Smoking status: Former Smoker    Packs/day: 0.50    Years: 20.00    Pack years: 10.00    Types: Cigarettes  . Smokeless tobacco: Never Used  Vaping Use  . Vaping Use: Never used  Substance and Sexual Activity  . Alcohol use: Yes    Alcohol/week: 4.0 standard drinks    Types: 4 Standard drinks or equivalent per week  . Drug use: No  . Sexual activity: Not on file  Other Topics Concern  . Not on file  Social History Narrative  . Not on file   Social Determinants of Health   Financial Resource Strain: Not on file  Food Insecurity: Not on file  Transportation Needs: Not on file  Physical Activity: Not on file  Stress: Not on file  Social Connections: Not on file  Intimate Partner Violence: Not on file    Family History  Problem Relation Age of Onset  . Hypertension Mother   . Hypertension Father   . Diabetes Sister   . Hypertension Sister   .  Healthy Son   . Healthy Daughter   . Colon cancer Neg Hx     Anti-infectives: Anti-infectives (From admission, onward)   None      Current Outpatient Medications  Medication Sig Dispense Refill  . blood glucose meter kit and supplies Dispense based on patient and insurance preference. Use up to four times daily as directed. (FOR ICD-10 E11.65) 1 each 0  . cloNIDine (CATAPRES) 0.2 MG tablet Take 1 tablet by mouth twice daily 180 tablet 0  . hydrochlorothiazide (HYDRODIURIL) 25 MG tablet Take 25 mg by mouth daily.    . insulin isophane & regular human (NOVOLIN 70/30 FLEXPEN) (70-30) 100 UNIT/ML KwikPen Inject 32 Units into the skin 2 (two) times daily with a meal. Inject 24 units with breakfast and 24 units with supper if glucose is above 90 and you are eating. 45 mL 3  . Lancets (ONETOUCH DELICA PLUS [id number]) MISC Use as instructed to test blood glucose four times daily 150 each 3  . losartan (COZAAR) 100 MG tablet Take 100 mg by mouth  daily.    . metFORMIN (GLUCOPHAGE) 1000 MG tablet Take 1,000 mg by mouth 2 (two) times daily.    . metoprolol (LOPRESSOR) 100 MG tablet Take 100 mg by mouth 2 (two) times daily.     Glory Rosebush ULTRA test strip USE 1 STRIP TO CHECK GLUCOSE 4 TIMES DAILY AS DIRECTED 150 each 5  . sildenafil (VIAGRA) 100 MG tablet Take 100 mg by mouth daily as needed.    . simvastatin (ZOCOR) 20 MG tablet Take 20 mg by mouth daily.    Marland Kitchen triamcinolone cream (KENALOG) 0.1 % Apply topically 2 (two) times daily as needed.     No current facility-administered medications for this visit.     Objective: Vital signs in last 24 hours: BP (!) 166/79   Pulse (!) 56   Temp 98.5 F (36.9 C)   Ht 5' 8" (1.727 m)   Wt 235 lb (106.6 kg)   BMI 35.73 kg/m   Intake/Output from previous day: No intake/output data recorded. Intake/Output this shift: _0 @   Physical Exam  Lab Results:  Recent Results (from the past 2160 hour(s))  Novel Coronavirus, NAA ([facility])     Status: None   Collection Time: 08/05/20  2:10 PM   Specimen: Nasopharyngeal(NP) swabs in vial transport medium   Nasopharynge  Screenin  Result Value Ref Range   SARS-CoV-2, NAA Not Detected Not Detected    Comment: This nucleic acid amplification test was developed and its performance characteristics determined by [employer]. Nucleic acid amplification tests include RT-PCR and TMA. This test has not been FDA cleared or approved. This test has been authorized by FDA under an Emergency Use Authorization (EUA). This test is only authorized for the duration of time the declaration that circumstances exist justifying the authorization of the emergency use of in vitro diagnostic tests for detection of SARS-CoV-2 virus and/or diagnosis of COVID-19 infection under section 564(b)(1) of the Act, 21 U.S.C. 644IHK-7(Q) (1), unless the authorization is terminated or revoked sooner. When diagnostic testing is negative, the possibility of a  false negative result should be considered in the context of a patient's recent exposures and the presence of clinical signs and symptoms consistent with COVID-19. An individual without symptoms of COVID-19 and who is not shedding SARS-CoV-2 virus wo uld expect to have a negative (not detected) result in this assay.   SARS-COV-2, NAA 2 DAY TAT     Status: None  Collection Time: 08/05/20  2:10 PM   Nasopharynge  Screenin  Result Value Ref Range   SARS-CoV-2, NAA 2 DAY TAT Performed   Urinalysis, Routine w reflex microscopic Urine, Clean Catch     Status: Abnormal   Collection Time: 08/21/20  1:28 PM  Result Value Ref Range   Color, Urine YELLOW YELLOW   APPearance CLOUDY (A) CLEAR   Specific Gravity, Urine 1.025 1.005 - 1.030   pH 6.0 5.0 - 8.0   Glucose, UA >=500 (A) NEGATIVE mg/dL   Hgb urine dipstick LARGE (A) NEGATIVE   Bilirubin Urine NEGATIVE NEGATIVE   Ketones, ur 20 (A) NEGATIVE mg/dL   Protein, ur >=300 (A) NEGATIVE mg/dL   Nitrite NEGATIVE NEGATIVE   Leukocytes,Ua MODERATE (A) NEGATIVE   RBC / HPF >50 (H) 0 - 5 RBC/hpf   WBC, UA >50 (H) 0 - 5 WBC/hpf   Bacteria, UA RARE (A) NONE SEEN   WBC Clumps PRESENT    Mucus PRESENT     Comment: Performed at [facility], [address], [place], [place] [address]  CBC with Differential/Platelet     Status: Abnormal   Collection Time: 08/21/20  2:37 PM  Result Value Ref Range   WBC 13.6 (H) 4.0 - 10.5 K/uL   RBC 4.85 4.22 - 5.81 MIL/uL   Hemoglobin 11.8 (L) 13.0 - 17.0 g/dL   HCT 35.7 (L) 39.0 - 52.0 %   MCV 73.6 (L) 80.0 - 100.0 fL   MCH 24.3 (L) 26.0 - 34.0 pg   MCHC 33.1 30.0 - 36.0 g/dL   RDW 15.3 11.5 - 15.5 %   Platelets 198 150 - 400 K/uL   nRBC 0.0 0.0 - 0.2 %   Neutrophils Relative % 72 %   Neutro Abs 9.7 (H) 1.7 - 7.7 K/uL   Lymphocytes Relative 20 %   Lymphs Abs 2.8 0.7 - 4.0 K/uL   Monocytes Relative 7 %   Monocytes Absolute 0.9 0.1 - 1.0 K/uL   Eosinophils Relative 0 %   Eosinophils Absolute 0.0 0.0 - 0.5  K/uL   Basophils Relative 0 %   Basophils Absolute 0.0 0.0 - 0.1 K/uL   Immature Granulocytes 1 %   Abs Immature Granulocytes 0.16 (H) 0.00 - 0.07 K/uL    Comment: Performed at [facility], [address] [place], [place] [address]  Comprehensive metabolic panel     Status: Abnormal   Collection Time: 08/21/20  2:37 PM  Result Value Ref Range   Sodium 134 (L) 135 - 145 mmol/L   Potassium 3.2 (L) 3.5 - 5.1 mmol/L   Chloride 100 98 - 111 mmol/L   CO2 23 22 - 32 mmol/L   Glucose, Bld 234 (H) 70 - 99 mg/dL    Comment: Glucose reference range applies only to samples taken after fasting for at least 8 hours.   BUN 9 8 - 23 mg/dL   Creatinine, Ser 1.02 0.61 - 1.24 mg/dL   Calcium 8.5 (L) 8.9 - 10.3 mg/dL   Total Protein 7.3 6.5 - 8.1 g/dL   Albumin 3.6 3.5 - 5.0 g/dL   AST 13 (L) 15 - 41 U/L   ALT 11 0 - 44 U/L   Alkaline Phosphatase 69 38 - 126 U/L   Total Bilirubin 0.6 0.3 - 1.2 mg/dL   GFR, Estimated >60 >60 mL/min    Comment: (NOTE) Calculated using the CKD-EPI Creatinine Equation (2021)    Anion gap 11 5 - 15    Comment: Performed  at [facility] [address] [place], [place] [address]  CBG monitoring, ED     Status: Abnormal   Collection Time: 08/21/20  2:39 PM  Result Value Ref Range   Glucose-Capillary 220 (H) 70 - 99 mg/dL    Comment: Glucose reference range applies only to samples taken after fasting for at least 8 hours.  Urine culture     Status: Abnormal   Collection Time: 08/21/20  5:30 PM   Specimen: Urine, Clean Catch  Result Value Ref Range   Specimen Description      URINE, CLEAN CATCH Performed at [facility], [address], [place], [place] [address]    Special Requests      NONE Performed at [facility], [address] [place], [place] [address]    Culture >=100,000 COLONIES/mL ESCHERICHIA COLI (A)    Report Status 08/24/2020 FINAL    Organism ID, Bacteria ESCHERICHIA COLI (A)       Susceptibility   Escherichia coli - MIC*    AMPICILLIN 4  SENSITIVE Sensitive     CEFAZOLIN <=4 SENSITIVE Sensitive     CEFEPIME <=0.12 SENSITIVE Sensitive     CEFTRIAXONE <=0.25 SENSITIVE Sensitive     CIPROFLOXACIN <=0.25 SENSITIVE Sensitive     GENTAMICIN <=1 SENSITIVE Sensitive     IMIPENEM <=0.25 SENSITIVE Sensitive     NITROFURANTOIN <=16 SENSITIVE Sensitive     TRIMETH/SULFA <=20 SENSITIVE Sensitive     AMPICILLIN/SULBACTAM <=2 SENSITIVE Sensitive     PIP/TAZO <=4 SENSITIVE Sensitive     * >=100,000 COLONIES/mL ESCHERICHIA COLI  Urinalysis, Routine w reflex microscopic     Status: None   Collection Time: 09/05/20 10:44 AM  Result Value Ref Range   Specific Gravity, UA 1.015 1.005 - 1.030   pH, UA 6.0 5.0 - 7.5   Color, UA Yellow Yellow   Appearance Ur Clear Clear   Leukocytes,UA Negative Negative   Protein,UA Negative Negative/Trace   Glucose, UA Negative Negative   Ketones, UA Negative Negative   RBC, UA Negative Negative   Bilirubin, UA Negative Negative   Urobilinogen, Ur 0.2 0.2 - 1.0 mg/dL   Nitrite, UA Negative Negative   Microscopic Examination Comment     Comment: Microscopic follows if indicated.  Bladder scan     Status: None   Collection Time: 09/05/20 11:36 AM  Result Value Ref Range   Scan Result 34   PSA, total and free     Status: None   Collection Time: 10/17/20  2:12 PM  Result Value Ref Range   Prostate Specific Ag, Serum 2.6 0.0 - 4.0 ng/mL    Comment: Roche ECLIA methodology. According to the American Urological Association, Serum PSA should decrease and remain at undetectable levels after radical prostatectomy. The AUA defines biochemical recurrence as an initial PSA value 0.2 ng/mL or greater followed by a subsequent confirmatory PSA value 0.2 ng/mL or greater. Values obtained with different assay methods or kits cannot be used interchangeably. Results cannot be interpreted as absolute evidence of the presence or absence of malignant disease.    PSA, Free 0.68 N/A ng/mL    Comment: Roche ECLIA  methodology.   PSA, Free Pct 26.2 %    Comment: The table below lists the probability of prostate cancer for men with non-suspicious DRE results and total PSA between 4 and 10 ng/mL, by patient age Ricci Barker, [place], [id number]).                   %  Free PSA       50-64 yr        65-75 yr                   0.00-10.00%        56%             55%                  10.01-15.00%        24%             35%                  15.01-20.00%        17%             23%                  20.01-25.00%        10%             20%                       >25.00%         5%              9% Please note:  Catalona et al did not make specific               recommendations regarding the use of               percent free PSA for any other population               of men.   HgB A1c     Status: Abnormal   Collection Time: 10/21/20  9:05 AM  Result Value Ref Range   Hemoglobin A1C     HbA1c POC (<> result, manual entry)     HbA1c, POC (prediabetic range)     HbA1c, POC (controlled diabetic range) 9.3 (A) 0.0 - 7.0 %   UA is clear.  Studies/Results: No results found.   Assessment/Plan: Elevated PSA.  His PSA has normalized with antibiotics.   He will just need f/u in a year with a PSA.   BPH with BOO.  His symptoms have improved with the antibiotic and he doesn't need further therapy.   History of UTI/Prostatitis.  UA is clear.   No orders of the defined types were placed in this encounter.    Orders Placed This Encounter  Procedures  . Urinalysis, Routine w reflex microscopic  . PSA, total and free    Standing Status:   Future    Standing Expiration Date:   10/24/2021     Return in about 1 year (around 10/24/2021) for with PSA.    CC: Dr. Rosita Fire.      Irine Seal 10/24/2020 [phone] ID: [name], male   DOB: August 11, 1945, 75 y.o.   MRN: [id number]

## 2020-11-12 ENCOUNTER — Ambulatory Visit: Payer: Medicare HMO | Attending: Internal Medicine

## 2020-11-12 DIAGNOSIS — Z23 Encounter for immunization: Secondary | ICD-10-CM

## 2020-11-12 NOTE — Progress Notes (Signed)
   Covid-19 Vaccination Clinic  Name:  Gerald Evans    MRN: [id number] DOB: 1946/04/09  11/12/2020  Gerald Evans was observed post Covid-19 immunization for 15 minutes without incident. He was provided with Vaccine Information Sheet and instruction to access the V-Safe system.   Gerald Evans was instructed to call 911 with any severe reactions post vaccine: Marland Kitchen Difficulty breathing  . Swelling of face and throat  . A fast heartbeat  . A bad rash all over body  . Dizziness and weakness   Immunizations Administered    Name Date Dose VIS Date Route   PFIZER Comrnaty(Gray TOP) Covid-19 Vaccine 11/12/2020  1:01 PM 0.3 mL 06/06/2020 Intramuscular   Manufacturer: [place]   Lot: [id number]   NDC: [phone]

## 2020-11-14 ENCOUNTER — Other Ambulatory Visit (HOSPITAL_BASED_OUTPATIENT_CLINIC_OR_DEPARTMENT_OTHER): Payer: Self-pay

## 2020-11-14 MED ORDER — PFIZER-BIONT COVID-19 VAC-TRIS 30 MCG/0.3ML IM SUSP
INTRAMUSCULAR | 0 refills | Status: DC
  Filled 2020-11-14: qty 0.3, 1d supply, fill #0

## 2020-12-09 ENCOUNTER — Telehealth: Payer: Self-pay | Admitting: Nurse Practitioner

## 2020-12-09 DIAGNOSIS — E1165 Type 2 diabetes mellitus with hyperglycemia: Secondary | ICD-10-CM

## 2020-12-09 MED ORDER — ONETOUCH DELICA LANCETS 30G MISC: 1.0000 | Freq: Three times a day (TID) | 3 refills | Status: AC

## 2020-12-09 MED ORDER — GLUCOSE BLOOD VI STRP: 1.0000 | ORAL_STRIP | Freq: Three times a day (TID) | 1 refills | Status: DC

## 2020-12-09 MED ORDER — LANCETS MISC: 1.0000 | Freq: Four times a day (QID) | 2 refills | Status: DC

## 2020-12-09 MED ORDER — GLUCOSE BLOOD VI STRP: 1.0000 | ORAL_STRIP | Freq: Four times a day (QID) | 2 refills | Status: DC

## 2020-12-09 NOTE — Telephone Encounter (Signed)
Rxs sent

## 2020-12-09 NOTE — Addendum Note (Signed)
Addended by: Ellin Saba on: 12/09/2020 04:52 PM   Modules accepted: Orders

## 2020-12-09 NOTE — Telephone Encounter (Signed)
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 States insurance plan covers it for 300 strips for 90 day supply.   Gerald Evans, Gerald Evans Phone:  [phone]  Fax:  [phone]

## 2020-12-09 NOTE — Telephone Encounter (Signed)
RX sent

## 2020-12-25 LAB — HM DIABETES EYE EXAM

## 2021-01-11 LAB — COMPREHENSIVE METABOLIC PANEL
ALT: 11 IU/L (ref 0–44)
AST: 11 IU/L (ref 0–40)
Albumin/Globulin Ratio: 1.7 (ref 1.2–2.2)
Albumin: 4.3 g/dL (ref 3.7–4.7)
Alkaline Phosphatase: 87 IU/L (ref 44–121)
BUN/Creatinine Ratio: 16 (ref 10–24)
BUN: 21 mg/dL (ref 8–27)
Bilirubin Total: 0.3 mg/dL (ref 0.0–1.2)
CO2: 25 mmol/L (ref 20–29)
Calcium: 10 mg/dL (ref 8.6–10.2)
Chloride: 99 mmol/L (ref 96–106)
Creatinine, Ser: 1.32 mg/dL — ABNORMAL HIGH (ref 0.76–1.27)
Globulin, Total: 2.6 g/dL (ref 1.5–4.5)
Glucose: 194 mg/dL — ABNORMAL HIGH (ref 65–99)
Potassium: 3.8 mmol/L (ref 3.5–5.2)
Sodium: 138 mmol/L (ref 134–144)
Total Protein: 6.9 g/dL (ref 6.0–8.5)
eGFR: 57 mL/min/{1.73_m2} — ABNORMAL LOW (ref >59)

## 2021-01-13 ENCOUNTER — Ambulatory Visit: Payer: Medicare HMO | Admitting: Nurse Practitioner

## 2021-01-13 ENCOUNTER — Encounter: Payer: Self-pay | Admitting: Nurse Practitioner

## 2021-01-13 ENCOUNTER — Other Ambulatory Visit: Payer: Self-pay

## 2021-01-13 VITALS — BP 106/63 | HR 56 | Ht 68.0 in | Wt 247.8 lb

## 2021-01-13 DIAGNOSIS — I10 Essential (primary) hypertension: Secondary | ICD-10-CM | POA: Diagnosis not present

## 2021-01-13 DIAGNOSIS — E1165 Type 2 diabetes mellitus with hyperglycemia: Secondary | ICD-10-CM | POA: Diagnosis not present

## 2021-01-13 DIAGNOSIS — E782 Mixed hyperlipidemia: Secondary | ICD-10-CM

## 2021-01-13 LAB — POCT GLYCOSYLATED HEMOGLOBIN (HGB A1C): HbA1c, POC (controlled diabetic range): 9.3 % — AB (ref 0.0–7.0)

## 2021-01-13 NOTE — Progress Notes (Signed)
01/13/2021, 2:56 PM  Endocrinology follow-up note   Subjective:    Patient ID: Gerald Evans, male    DOB: [birth year].  Gerald Evans is being seen in follow-up after he was seen in consultation for management of currently uncontrolled symptomatic diabetes requested by  Rosita Fire, MD.   Past Medical History:  Diagnosis Date   Colon polyps    Diabetes mellitus without complication ([place])    Fatty liver    Hypercholesteremia    Hypertension     Past Surgical History:  Procedure Laterality Date   COLONOSCOPY     COLONOSCOPY  04/21/2012   Procedure: COLONOSCOPY;  Surgeon: Rogene Houston, MD;  Location: [facility];  Service: Endoscopy;  Laterality: N/A;  1200   COLONOSCOPY N/A 09/09/2017   Procedure: COLONOSCOPY;  Surgeon: Rogene Houston, MD;  Location: [facility];  Service: Endoscopy;  Laterality: N/A;  1200-rescheduled to 3/14/ @12 :00pm per [place]     POLYPECTOMY  09/09/2017   Procedure: POLYPECTOMY;  Surgeon: Rogene Houston, MD;  Location: [facility];  Service: Endoscopy;;  Transverse Colon ([facility])    Social History   Socioeconomic History   Marital status: Married    Spouse name: Not on file   Number of children: 2   Years of education: Not on file   Highest education level: Not on file  Occupational History   Occupation: retired  Tobacco Use   Smoking status: Former    Packs/day: 0.50    Years: 20.00    Pack years: 10.00    Types: Cigarettes   Smokeless tobacco: Never  Vaping Use   Vaping Use: Never used  Substance and Sexual Activity   Alcohol use: Yes    Alcohol/week: 4.0 standard drinks    Types: 4 Standard drinks or equivalent per week   Drug use: No   Sexual activity: Not on file  Other Topics Concern   Not on file  Social History Narrative   Not on file   Social Determinants of Health   Financial Resource Strain: Not on file  Food  Insecurity: Not on file  Transportation Needs: Not on file  Physical Activity: Not on file  Stress: Not on file  Social Connections: Not on file    Family History  Problem Relation Age of Onset   Hypertension Mother    Hypertension Father    Diabetes Sister    Hypertension Sister    Healthy Son    Healthy Daughter    Colon cancer Neg Hx     Outpatient Encounter Medications as of 01/13/2021  Medication Sig   blood glucose meter kit and supplies Dispense based on patient and insurance preference. Use up to four times daily as directed. (FOR ICD-10 E11.65)   cloNIDine (CATAPRES) 0.2 MG tablet Take 1 tablet by mouth twice daily   COVID-19 mRNA Vac-TriS, Pfizer, (PFIZER-BIONT COVID-19 VAC-TRIS) SUSP injection Inject into the muscle.   glucose blood test strip 1 each by Other route 3 (three) times daily. Use as instructed   hydrochlorothiazide (HYDRODIURIL) 25 MG tablet Take 25 mg by mouth daily.   insulin  isophane & regular human (NOVOLIN 70/30 FLEXPEN) (70-30) 100 UNIT/ML KwikPen Inject 32 Units into the skin 2 (two) times daily with a meal. Inject 24 units with breakfast and 24 units with supper if glucose is above 90 and you are eating. (Patient taking differently: Inject 36 Units into the skin 2 (two) times daily with a meal.)   losartan (COZAAR) 100 MG tablet Take 100 mg by mouth daily.   metFORMIN (GLUCOPHAGE) 1000 MG tablet Take 1,000 mg by mouth 2 (two) times daily.   metoprolol (LOPRESSOR) 100 MG tablet Take 100 mg by mouth 2 (two) times daily.    OneTouch Delica Lancets 88B MISC 1 each by Does not apply route 3 (three) times daily. Use to check blood glucose three times daily   sildenafil (VIAGRA) 100 MG tablet Take 100 mg by mouth daily as needed.   simvastatin (ZOCOR) 20 MG tablet Take 20 mg by mouth daily.   triamcinolone cream (KENALOG) 0.1 % Apply topically 2 (two) times daily as needed.   No facility-administered encounter medications on file as of 01/13/2021.     ALLERGIES: Allergies  Allergen Reactions   Lisinopril     cough    VACCINATION STATUS: Immunization History  Administered Date(s) Administered   PFIZER Comirnaty(Gray Top)Covid-19 Tri-Sucrose Vaccine 11/12/2020   PFIZER(Purple Top)SARS-COV-2 Vaccination 08/20/2019, 09/12/2019, 04/25/2020    Diabetes He presents for his follow-up diabetic visit. He has type 2 diabetes mellitus. Onset time: He was diagnosed at approximate age of 33 years. His disease course has been stable. There are no hypoglycemic associated symptoms. Pertinent negatives for hypoglycemia include no confusion, headaches, pallor or seizures. Pertinent negatives for diabetes include no chest pain, no fatigue, no polydipsia, no polyphagia, no polyuria and no weakness. There are no hypoglycemic complications. Symptoms are stable. Diabetic complications include impotence. Risk factors for coronary artery disease include dyslipidemia, diabetes mellitus, family history, tobacco exposure, sedentary lifestyle, male sex, obesity and hypertension. Current diabetic treatment includes oral agent (monotherapy) and insulin injections. He is compliant with treatment most of the time. His weight is increasing steadily. He is following a generally unhealthy diet. When asked about meal planning, he reported none. He has not had a previous visit with a dietitian. He participates in exercise intermittently. His home blood glucose trend is fluctuating minimally. His overall blood glucose range is 140-180 mg/dl. (He presents today with his meter, no logs, showing above target fasting and postprandial glycemic profile and inconsistent monitoring pattern.  His A1c today is 9.3%, unchanged from previous visit.  We did check his A1c sooner than the 3 month timeframe since [place] will be going out of the country soon.  Analysis of his meter shows 7-day average of 174 with 6 readings; 14-day average of 163 with 13 readings and 30-day average of 181 with 30  readings.  He denies any hypoglycemia.) An ACE inhibitor/angiotensin II receptor blocker is being taken. He does not see a podiatrist.Eye exam is current.  Hyperlipidemia This is a chronic problem. The current episode started more than 1 year ago. The problem is controlled. Recent lipid tests were reviewed and are normal. Exacerbating diseases include chronic renal disease, diabetes and obesity. Factors aggravating his hyperlipidemia include fatty foods, beta blockers and thiazides. Pertinent negatives include no chest pain, myalgias or shortness of breath. Current antihyperlipidemic treatment includes statins. The current treatment provides mild improvement of lipids. Compliance problems include adherence to diet and adherence to exercise.  Risk factors for coronary artery disease include diabetes mellitus, dyslipidemia, hypertension,  male sex, obesity, a sedentary lifestyle and family history.  Hypertension This is a chronic problem. The current episode started more than 1 year ago. The problem has been waxing and waning since onset. The problem is controlled. Pertinent negatives include no chest pain, headaches, neck pain, palpitations or shortness of breath. There are no associated agents to hypertension. Risk factors for coronary artery disease include dyslipidemia, diabetes mellitus, male gender, obesity and smoking/tobacco exposure. Past treatments include angiotensin blockers, diuretics, beta blockers and central alpha agonists. The current treatment provides mild improvement. There are no compliance problems.  Hypertensive end-organ damage includes kidney disease. Identifiable causes of hypertension include chronic renal disease.   Review of systems  Constitutional: + Minimally fluctuating body weight,  current Body mass index is 37.68 kg/m. , no fatigue, no subjective hyperthermia, no subjective hypothermia Eyes: no blurry vision, no xerophthalmia ENT: no sore throat, no nodules palpated in  throat, no dysphagia/odynophagia, no hoarseness Cardiovascular: no chest pain, no shortness of breath, no palpitations, no leg swelling Respiratory: no cough, no shortness of breath Gastrointestinal: no nausea/vomiting/diarrhea Musculoskeletal: no muscle/joint aches Skin: no rashes, no hyperemia Neurological: no tremors, no numbness, no tingling, no dizziness Psychiatric: no depression, no anxiety  Objective:    BP 106/63   Pulse (!) 56   Ht 5' 8"  (1.727 m)   Wt 247 lb 12.8 oz (112.4 kg)   BMI 37.68 kg/m   Wt Readings from Last 3 Encounters:  01/13/21 247 lb 12.8 oz (112.4 kg)  10/24/20 235 lb (106.6 kg)  09/05/20 231 lb (104.8 kg)    BP Readings from Last 3 Encounters:  01/13/21 106/63  10/24/20 (!) 166/79  10/21/20 (!) 172/84     Physical Exam- Limited  Constitutional:  Body mass index is 37.68 kg/m. , not in acute distress, normal state of mind Eyes:  EOMI, no exophthalmos Neck: Supple Cardiovascular: RRR, no murmurs, rubs, or gallops, no edema Respiratory: Adequate breathing efforts, no crackles, rales, rhonchi, or wheezing Musculoskeletal: no gross deformities, strength intact in all four extremities, no gross restriction of joint movements Skin:  no rashes, no hyperemia Neurological: no tremor with outstretched hands     CMP ( most recent) CMP     Component Value Date/Time   NA 138 01/10/2021 1010   K 3.8 01/10/2021 1010   CL 99 01/10/2021 1010   CO2 25 01/10/2021 1010   GLUCOSE 194 (H) 01/10/2021 1010   GLUCOSE 234 (H) 08/21/2020 1437   BUN 21 01/10/2021 1010   CREATININE 1.32 (H) 01/10/2021 1010   CREATININE 1.00 03/18/2020 1321   CALCIUM 10.0 01/10/2021 1010   PROT 6.9 01/10/2021 1010   ALBUMIN 4.3 01/10/2021 1010   AST 11 01/10/2021 1010   ALT 11 01/10/2021 1010   ALKPHOS 87 01/10/2021 1010   BILITOT 0.3 01/10/2021 1010   GFRNONAA >60 08/21/2020 1437   GFRNONAA 74 03/18/2020 1321   GFRAA 67 07/17/2020 0953   GFRAA 86 03/18/2020 1321     Diabetic Labs (most recent): Lab Results  Component Value Date   HGBA1C 9.3 (A) 01/13/2021   HGBA1C 9.3 (A) 10/21/2020   HGBA1C 9.6 (H) 07/17/2020     Assessment & Plan:   1) Uncontrolled type 2 diabetes mellitus with hyperglycemia ([facility])  - Gerald Evans has currently uncontrolled symptomatic type 2 DM since  75 years of age.  He presents today with his meter, no logs, showing above target fasting and postprandial glycemic profile and inconsistent monitoring pattern.  His A1c today  is 9.3%, unchanged from previous visit.  We did check his A1c sooner than the 3 month timeframe since he will be going out of the country soon.  Analysis of his meter shows 7-day average of 174 with 6 readings; 14-day average of 163 with 13 readings and 30-day average of 181 with 30 readings.  He denies any hypoglycemia.   Recent labs reviewed.  - I had a long discussion with him about the progressive nature of diabetes and the pathology behind its complications.  -his diabetes is complicated by obesity/sedentary life and he remains at a high risk for more acute and chronic complications which include CAD, CVA, CKD, retinopathy, and neuropathy. These are all discussed in detail with him.  - Nutritional counseling repeated at each appointment due to patients tendency to fall back in to old habits.  - The patient admits there is a room for improvement in their diet and drink choices. -  Suggestion is made for the patient to avoid simple carbohydrates from their diet including Cakes, Sweet Desserts / Pastries, Ice Cream, Soda (diet and regular), Sweet Tea, Candies, Chips, Cookies, Sweet Pastries, Store Bought Juices, Alcohol in Excess of 1-2 drinks a day, Artificial Sweeteners, Coffee Creamer, and "Sugar-free" Products. This will help patient to have stable blood glucose profile and potentially avoid unintended weight gain.   - I encouraged the patient to switch to unprocessed or minimally processed  complex starch and increased protein intake (animal or plant source), fruits, and vegetables.   - Patient is advised to stick to a routine mealtimes to eat 3 meals a day and avoid unnecessary snacks (to snack only to correct hypoglycemia).  - I have approached him with the following individualized plan to manage  his diabetes and patient agrees:   - he will continue to require insulin treatment in order for him to achieve and maintain control of diabetes to target.    -He is benefiting from simplified insulin regimen with premixed insulin.  Given his fasting hyperglycemia, he will tolerate slight increase of his 70/30 to 40 units BID with meals if glucose is above 90 and he is eating.  He can also continue Metformin 1000 mg po twice daily with meals.    -He is encouraged to do better monitoring his blood glucose.  He is urged to monitor sugar at least 3 times per day, before injecting insulin (at breakfast and supper) and at bedtime and call the clinic if readings are less than 70 or greater than 300 for 3 tests in a row.    He will be considered for incretin therapy as needed on subsequent visits.  - Specific targets for  A1c;  LDL, HDL,  and Triglycerides were discussed with the patient.  2) Blood Pressure /Hypertension:  His blood pressure is controlled to target.  He is advised to continue Clonidine 0.2 mg po twice daily, HCTZ 25 mg po daily, Losartan 100 mg po daily, and Metoprolol 100 mg po twice daily.    3) Lipids/Hyperlipidemia:  His most recent lipid panel from 03/18/20 shows controlled LDL of 74.  He is advised to continue Simvastatin 20 mg po daily at bedtime.  Side effects and precautions discussed with him.  Will recheck lipid panel prior to next visit.  4)  Weight/Diet:  His Body mass index is 37.68 kg/m.  -   clearly complicating his diabetes care.   he is  a candidate for weight loss. I discussed with him the fact that loss of 5 -  10% of his  current body weight will have the  most impact on his diabetes management.  Exercise, and detailed carbohydrates information provided  -  detailed on discharge instructions.  5) Chronic Care/Health Maintenance: -he  is on ACEI/ARB and Statin medications and  is encouraged to initiate and continue to follow up with Ophthalmology, Dentist,  Podiatrist at least yearly or according to recommendations, and advised to  stay away from smoking. I have recommended yearly flu vaccine and pneumonia vaccine at least every 5 years; moderate intensity exercise for up to 150 minutes weekly; and  sleep for at least 7 hours a day.  - he is  advised to maintain close follow up with Rosita Fire, MD for primary care needs, as well as his other providers for optimal and coordinated care.      I spent 30 minutes in the care of the patient today including review of labs from Thyroid Function, CMP, and other relevant labs ; imaging/biopsy records (current and previous including abstractions from other facilities); face-to-face time discussing  his lab results and symptoms, medications doses, his options of short and long term treatment based on the latest standards of care / guidelines;   and documenting the encounter.  Gerald Evans  participated in the discussions, expressed understanding, and voiced agreement with the above plans.  All questions were answered to his satisfaction. he is encouraged to contact clinic should he have any questions or concerns prior to his return visit.  Follow up plan: - Return in about 3 months (around 04/15/2021) for Diabetes F/U- A1c and UM in office, Previsit labs, Bring meter and logs.  Rayetta Pigg, [facility] [address] [place], [place] [address] Phone: [phone] Fax: [phone]  01/13/2021, 2:56 PM

## 2021-01-13 NOTE — Patient Instructions (Signed)

## 2021-02-25 ENCOUNTER — Other Ambulatory Visit (HOSPITAL_BASED_OUTPATIENT_CLINIC_OR_DEPARTMENT_OTHER): Payer: Self-pay

## 2021-03-04 ENCOUNTER — Encounter (INDEPENDENT_AMBULATORY_CARE_PROVIDER_SITE_OTHER): Payer: Self-pay | Admitting: Bariatrics

## 2021-03-04 ENCOUNTER — Ambulatory Visit (INDEPENDENT_AMBULATORY_CARE_PROVIDER_SITE_OTHER): Payer: Medicare HMO | Admitting: Bariatrics

## 2021-03-04 ENCOUNTER — Other Ambulatory Visit: Payer: Self-pay

## 2021-03-04 VITALS — BP 125/66 | HR 53 | Temp 97.6°F | Ht 68.0 in | Wt 242.0 lb

## 2021-03-04 DIAGNOSIS — R0602 Shortness of breath: Secondary | ICD-10-CM

## 2021-03-04 DIAGNOSIS — E1159 Type 2 diabetes mellitus with other circulatory complications: Secondary | ICD-10-CM

## 2021-03-04 DIAGNOSIS — Z0289 Encounter for other administrative examinations: Secondary | ICD-10-CM

## 2021-03-04 DIAGNOSIS — E559 Vitamin D deficiency, unspecified: Secondary | ICD-10-CM

## 2021-03-04 DIAGNOSIS — Z1331 Encounter for screening for depression: Secondary | ICD-10-CM

## 2021-03-04 DIAGNOSIS — E1169 Type 2 diabetes mellitus with other specified complication: Secondary | ICD-10-CM

## 2021-03-04 DIAGNOSIS — R5383 Other fatigue: Secondary | ICD-10-CM

## 2021-03-04 DIAGNOSIS — E669 Obesity, unspecified: Secondary | ICD-10-CM

## 2021-03-04 DIAGNOSIS — Z6836 Body mass index (BMI) 36.0-36.9, adult: Secondary | ICD-10-CM

## 2021-03-04 DIAGNOSIS — I152 Hypertension secondary to endocrine disorders: Secondary | ICD-10-CM

## 2021-03-04 DIAGNOSIS — E785 Hyperlipidemia, unspecified: Secondary | ICD-10-CM

## 2021-03-05 ENCOUNTER — Encounter (INDEPENDENT_AMBULATORY_CARE_PROVIDER_SITE_OTHER): Payer: Self-pay | Admitting: Bariatrics

## 2021-03-05 DIAGNOSIS — R7989 Other specified abnormal findings of blood chemistry: Secondary | ICD-10-CM | POA: Insufficient documentation

## 2021-03-05 LAB — LIPID PANEL WITH LDL/HDL RATIO
Cholesterol, Total: 140 mg/dL (ref 100–199)
HDL: 42 mg/dL (ref >39)
LDL Chol Calc (NIH): 77 mg/dL (ref 0–99)
LDL/HDL Ratio: 1.8 ratio (ref 0.0–3.6)
Triglycerides: 118 mg/dL (ref 0–149)
VLDL Cholesterol Cal: 21 mg/dL (ref 5–40)

## 2021-03-05 LAB — T4, FREE: Free T4: 1.04 ng/dL (ref 0.82–1.77)

## 2021-03-05 LAB — TSH: TSH: 5.56 u[IU]/mL — ABNORMAL HIGH (ref 0.450–4.500)

## 2021-03-05 LAB — T3: T3, Total: 98 ng/dL (ref 71–180)

## 2021-03-05 LAB — VITAMIN D 25 HYDROXY (VIT D DEFICIENCY, FRACTURES): Vit D, 25-Hydroxy: 34.1 ng/mL (ref 30.0–100.0)

## 2021-03-05 NOTE — Progress Notes (Signed)
Chief Complaint:   Gerald Evans (MR# [id number]) is a 75 y.o. male who presents for evaluation and treatment of obesity and related comorbidities. Current BMI is Body mass index is 36.8 kg/m. Gerald Evans has been struggling with his weight for many years and has been unsuccessful in either losing weight, maintaining weight loss, or reaching his healthy weight goal.  Gerald Evans is currently in the action stage of change and ready to dedicate time achieving and maintaining a healthier weight. Gerald Evans is interested in becoming our patient and working on intensive lifestyle modifications including (but not limited to) diet and exercise for weight loss.  Gerald Evans likes to [employer] and denies obstacles.   Gerald Evans's habits were reviewed today and are as follows: His family eats meals together, he thinks his family will eat healthier with him, his desired weight loss is 41 pounds, he started gaining weight 10 years ago, his heaviest weight ever was 240 pounds, he snacks frequently in the evenings, he is frequently drinking liquids with calories, he frequently makes poor food choices, and he frequently eats larger portions than normal.  Depression Screen Gerald Evans's Food and Mood (modified PHQ-9) score was 2.  Depression screen [name] 2/9 03/04/2021  Decreased Interest 0  Down, Depressed, Hopeless 0  PHQ - 2 Score 0  Altered sleeping 1  Tired, decreased energy 1  Change in appetite 0  Feeling bad or failure about yourself  0  Trouble concentrating 0  Moving slowly or fidgety/restless 0  Suicidal thoughts 0  PHQ-9 Score 2  Difficult doing work/chores Not difficult at all   Subjective:   1. Other fatigue Gerald Evans admits to daytime somnolence and denies waking up still tired. Patent has a history of symptoms of daytime fatigue and snoring. Gerald Evans generally gets 8 hours of sleep per night, and states that he has generally restful sleep. Snoring is present. Apneic episodes are not present. [place]  Sleepiness Score is 8.  Occurs with certain activities.   2. SOB (shortness of breath) on exertion Gerald Evans notes increasing shortness of breath with exercising and seems to be worsening over time with weight gain. He notes getting out of breath sooner with activity than he used to. This has not gotten worse recently. Gerald Evans denies shortness of breath at rest or orthopnea.  Occurs with certain activities.  3. Diabetes mellitus type 2 in obese (HCC) Taking insulin 70/30 and metformin.  Last A1c 9.3 on 01/13/2021.  FBS 150s.  No lows.    Lab Results  Component Value Date   HGBA1C 9.3 (A) 01/13/2021   HGBA1C 9.3 (A) 10/21/2020   HGBA1C 9.6 (H) 07/17/2020   Lab Results  Component Value Date   MICROALBUR 80 03/20/2020   LDLCALC 77 03/04/2021   CREATININE 1.32 (H) 01/10/2021   4. Hypertension associated with diabetes ([place]) Controlled.  Review: taking medications as instructed, no medication side effects noted, no chest pain on exertion, no dyspnea on exertion, no swelling of ankles.  Taking amlodipine, HCTZ, metoprolol, clonidine, and losartan.  BP Readings from Last 3 Encounters:  03/04/21 125/66  01/13/21 106/63  10/24/20 (!) 166/79   5. Hyperlipidemia associated with type 2 diabetes mellitus ([place]) Gerald Evans has hyperlipidemia and has been trying to improve his cholesterol levels with intensive lifestyle modification including a low saturated fat diet, exercise and weight loss. He denies any chest pain, claudication or myalgias.  Taking simvastatin.  Lab Results  Component Value Date   ALT 11 01/10/2021   AST 11 01/10/2021  ALKPHOS 87 01/10/2021   BILITOT 0.3 01/10/2021   Lab Results  Component Value Date   CHOL 140 03/04/2021   HDL 42 03/04/2021   LDLCALC 77 03/04/2021   TRIG 118 03/04/2021   CHOLHDL 3.1 03/18/2020   6. Vitamin D deficiency He is currently taking no vitamin D supplement.   Lab Results  Component Value Date   VD25OH 34.1 03/04/2021   7. Depression  screen Gerald Evans was screened for depression as part of his new patient workup today.  PHQ-9 is 2.  Assessment/Plan:   1. Other fatigue Gerald Evans does not feel that his weight is causing his energy to be lower than it should be. Fatigue may be related to obesity, depression or many other causes. Labs will be ordered, and in the meanwhile, Gerald Evans will focus on self care including making healthy food choices, increasing physical activity and focusing on stress reduction.  Gradually increase activities.  Will check thyroid panel today.  - EKG 12-Lead - Lipid Panel With LDL/HDL Ratio - T4, free - TSH - T3 - VITAMIN D 25 Hydroxy (Vit-D Deficiency, Fractures)  2. SOB (shortness of breath) on exertion Gerald Evans does not feel that he gets out of breath more easily that he used to when he exercises. Gerald Evans's shortness of breath appears to be obesity related and exercise induced. He has agreed to work on weight loss and gradually increase exercise to treat his exercise induced shortness of breath. Will continue to monitor closely. Gradually increase activities.    - Lipid Panel With LDL/HDL Ratio - T4, free - TSH - T3  3. Diabetes mellitus type 2 in obese (HCC) Continue medications.  Decrease insulin to 28 units twice daily.  If blood sugar less than 130, hold insulin.  Written instructions given - sheet on hypoglycemia.  Good blood sugar control is important to decrease the likelihood of diabetic complications such as nephropathy, neuropathy, limb loss, blindness, coronary artery disease, and death. Intensive lifestyle modification including diet, exercise and weight loss are the first line of treatment for diabetes.   - Lipid Panel With LDL/HDL Ratio  4. Hypertension associated with diabetes ([place]) Gerald Evans is working on healthy weight loss and exercise to improve blood pressure control. We will watch for signs of hypotension as he continues his lifestyle modifications.  Continue medications.  5.  Hyperlipidemia associated with type 2 diabetes mellitus ([place]) Cardiovascular risk and specific lipid/LDL goals reviewed.  We discussed several lifestyle modifications today and Gerald Evans will continue to work on diet, exercise and weight loss efforts. Orders and follow up as documented in patient record.  Continue medications.  Check lipid panel.  Counseling Intensive lifestyle modifications are the first line treatment for this issue. Dietary changes: Increase soluble fiber. Decrease simple carbohydrates. Exercise changes: Moderate to vigorous-intensity aerobic activity 150 minutes per week if tolerated. Lipid-lowering medications: see documented in medical record.  - Lipid Panel With LDL/HDL Ratio  6. Vitamin D deficiency Low Vitamin D level contributes to fatigue and are associated with obesity, breast, and colon cancer.  Will check vitamin D level today.  - VITAMIN D 25 Hydroxy (Vit-D Deficiency, Fractures)  7. Depression screen Depression screen is negative.  8. Class 2 severe obesity with serious comorbidity and body mass index (BMI) of 36.0 to 36.9 in adult, unspecified obesity type [facility])  Gerald Evans is currently in the action stage of change and his goal is to continue with weight loss efforts. I recommend Gerald Evans begin the structured treatment plan as follows:  He has  agreed to the Category 2 Plan.  He will work on meal planning and decreasing portion sizes.  Reviewed labs from 01/10/2021, including CMP, globulin total, glucose, A1c.  Exercise goals: No exercise has been prescribed at this time.   Behavioral modification strategies: increasing lean protein intake, decreasing simple carbohydrates, increasing vegetables, increasing water intake, decreasing eating out, no skipping meals, meal planning and cooking strategies, keeping healthy foods in the home, and planning for success.  He was informed of the importance of frequent follow-up visits to maximize his success with intensive  lifestyle modifications for his multiple health conditions. He was informed we would discuss his lab results at his next visit unless there is a critical issue that needs to be addressed sooner. Gerald Evans agreed to keep his next visit at the agreed upon time to discuss these results.  Objective:   Blood pressure 125/66, pulse (!) 53, temperature 97.6 F (36.4 C), height 5' 8"  (1.727 m), weight 242 lb (109.8 kg), SpO2 98 %. Body mass index is 36.8 kg/m.  EKG: Normal sinus rhythm, rate 49 bpm. Marked sinus bradycardia. Poor R-wave progression - nonspecific.  Anterolateral ST-elevation - repolarization variant.  Indirect Calorimeter completed today shows a VO2 of 218 and a REE of 1498.  His calculated basal metabolic rate is 4680 thus his basal metabolic rate is worse than expected.  General: Cooperative, alert, well developed, in no acute distress. HEENT: Conjunctivae and lids unremarkable. Cardiovascular: Regular rhythm.  Lungs: Normal work of breathing. Neurologic: No focal deficits.   Lab Results  Component Value Date   CREATININE 1.32 (H) 01/10/2021   BUN 21 01/10/2021   NA 138 01/10/2021   K 3.8 01/10/2021   CL 99 01/10/2021   CO2 25 01/10/2021   Lab Results  Component Value Date   ALT 11 01/10/2021   AST 11 01/10/2021   ALKPHOS 87 01/10/2021   BILITOT 0.3 01/10/2021   Lab Results  Component Value Date   HGBA1C 9.3 (A) 01/13/2021   HGBA1C 9.3 (A) 10/21/2020   HGBA1C 9.6 (H) 07/17/2020   HGBA1C 8.8 (A) 03/20/2020   HGBA1C 8.5 10/10/2019   Lab Results  Component Value Date   TSH 5.560 (H) 03/04/2021   Lab Results  Component Value Date   CHOL 140 03/04/2021   HDL 42 03/04/2021   LDLCALC 77 03/04/2021   TRIG 118 03/04/2021   CHOLHDL 3.1 03/18/2020   Lab Results  Component Value Date   WBC 13.6 (H) 08/21/2020   HGB 11.8 (L) 08/21/2020   HCT 35.7 (L) 08/21/2020   MCV 73.6 (L) 08/21/2020   PLT 198 08/21/2020   Lab Results  Component Value Date   IRON 68  05/25/2016   TIBC 279 05/25/2016   FERRITIN 187 05/25/2016   Obesity Behavioral Intervention:   Approximately 15 minutes were spent on the discussion below.  ASK: We discussed the diagnosis of obesity with Gerald Evans today and Tyr agreed to give [place] permission to discuss obesity behavioral modification therapy today.  ASSESS: Lamario has the diagnosis of obesity and his BMI today is 36.8. Shade is in the action stage of change.   ADVISE: Jacory was educated on the multiple health risks of obesity as well as the benefit of weight loss to improve his health. He was advised of the need for long term treatment and the importance of lifestyle modifications to improve his current health and to decrease his risk of future health problems.  AGREE: Multiple dietary modification options and treatment options were discussed  and Esli agreed to follow the recommendations documented in the above note.  ARRANGE: Dijon was educated on the importance of frequent visits to treat obesity as outlined per CMS and USPSTF guidelines and agreed to schedule his next follow up appointment today.  Attestation Statements:   Reviewed by clinician on day of visit: allergies, medications, problem list, medical history, surgical history, family history, social history, and previous encounter notes.  I, Water quality scientist, CMA, am acting as Location manager for [employer], DO  I have reviewed the above documentation for accuracy and completeness, and I agree with the above. Jearld Lesch, DO

## 2021-03-19 ENCOUNTER — Encounter (INDEPENDENT_AMBULATORY_CARE_PROVIDER_SITE_OTHER): Payer: Self-pay

## 2021-03-19 ENCOUNTER — Ambulatory Visit (INDEPENDENT_AMBULATORY_CARE_PROVIDER_SITE_OTHER): Payer: Medicare HMO | Admitting: Bariatrics

## 2021-03-21 ENCOUNTER — Ambulatory Visit (INDEPENDENT_AMBULATORY_CARE_PROVIDER_SITE_OTHER): Payer: Medicare HMO | Admitting: Bariatrics

## 2021-04-01 ENCOUNTER — Ambulatory Visit (INDEPENDENT_AMBULATORY_CARE_PROVIDER_SITE_OTHER): Payer: Medicare HMO | Admitting: Bariatrics

## 2021-04-01 ENCOUNTER — Other Ambulatory Visit: Payer: Self-pay

## 2021-04-01 ENCOUNTER — Encounter (INDEPENDENT_AMBULATORY_CARE_PROVIDER_SITE_OTHER): Payer: Self-pay | Admitting: Bariatrics

## 2021-04-01 VITALS — BP 148/76 | HR 63 | Temp 98.1°F | Ht 68.0 in | Wt 235.0 lb

## 2021-04-01 DIAGNOSIS — I152 Hypertension secondary to endocrine disorders: Secondary | ICD-10-CM

## 2021-04-01 DIAGNOSIS — E1169 Type 2 diabetes mellitus with other specified complication: Secondary | ICD-10-CM | POA: Diagnosis not present

## 2021-04-01 DIAGNOSIS — Z6836 Body mass index (BMI) 36.0-36.9, adult: Secondary | ICD-10-CM | POA: Diagnosis not present

## 2021-04-01 DIAGNOSIS — E669 Obesity, unspecified: Secondary | ICD-10-CM

## 2021-04-01 DIAGNOSIS — E559 Vitamin D deficiency, unspecified: Secondary | ICD-10-CM | POA: Diagnosis not present

## 2021-04-01 DIAGNOSIS — E785 Hyperlipidemia, unspecified: Secondary | ICD-10-CM

## 2021-04-01 DIAGNOSIS — E1159 Type 2 diabetes mellitus with other circulatory complications: Secondary | ICD-10-CM | POA: Diagnosis not present

## 2021-04-01 MED ORDER — VITAMIN D (ERGOCALCIFEROL) 1.25 MG (50000 UNIT) PO CAPS: 50000.0000 [IU] | ORAL_CAPSULE | ORAL | 0 refills | Status: DC

## 2021-04-01 NOTE — Progress Notes (Signed)
Chief Complaint:   OBESITY Gerald Evans is here to discuss his progress with his obesity treatment plan along with follow-up of his obesity related diagnoses. Gerald Evans is on the Category 2 Plan and states he is following his eating plan approximately 98% of the time. Gerald Evans states he is doing 0 minutes 0 times per week.  Today's visit was #: 3 Starting weight: 242 lbs Starting date: 03/04/2021 Today's weight: 235 lbs Today's date: 04/01/2021 Total lbs lost to date: 7 lbs Total lbs lost since last in-office visit: 7 lbs  Interim History: Gerald Evans is down 7 lbs from his first visit. It was not difficult. He is getting adequate water intake.   Subjective:   1. Hyperlipidemia associated with type 2 diabetes mellitus (Gerald Evans) Gerald Evans is taking Zocor currently.   2. Hypertension associated with diabetes (Gerald Evans) Gerald Evans is currently taking HCTZ, Cozaar and Lopressor. He did not take his medications this morning.  3. Vitamin D insufficiency Gerald Evans is currently not on medication.  4. Diabetes mellitus type 2 in obese Gerald Evans) Gerald Evans is currently taking Insulin and Glucophage. His fasting blood sugar was in the range of 107-240. He notes no lows.    Assessment/Plan:   1. Hyperlipidemia associated with type 2 diabetes mellitus (Gerald Evans) Cardiovascular risk and specific lipid/LDL goals reviewed.  We discussed several lifestyle modifications today and Gerald Evans will continue to work on diet, exercise and weight loss efforts. Gerald Evans will continue medications. He will continue with zero trans fats and minimize saturated fats. Orders and follow up as documented in patient record.   Counseling Intensive lifestyle modifications are the first line treatment for this issue. Dietary changes: Increase soluble fiber. Decrease simple carbohydrates. Exercise changes: Moderate to vigorous-intensity aerobic activity 150 minutes per week if tolerated. Lipid-lowering medications: see documented in medical  record.   2. Hypertension associated with diabetes (Gerald Evans) Gerald Evans is working on healthy weight loss and exercise to improve blood pressure control. Gerald Evans will continue medications. We will watch for signs of hypotension as he continues his lifestyle modifications.  3. Vitamin D insufficiency Low Vitamin D level contributes to fatigue and are associated with obesity, breast, and colon cancer. We will refill prescription Vitamin D 50,000 IU every week for 1 month with no refills and Gerald Evans will follow-up for routine testing of Vitamin D, at least 2-3 times per year to avoid over-replacement.  - Vitamin D, Ergocalciferol, (DRISDOL) 1.25 MG (50000 UNIT) CAPS capsule; Take 1 capsule (50,000 Units total) by mouth every 7 (seven) days.  Dispense: 4 capsule; Refill: 0  4. Diabetes mellitus type 2 in obese Gerald Evans) Gerald Evans will continue his same medications. Good blood sugar control is important to decrease the likelihood of diabetic complications such as nephropathy, neuropathy, limb loss, blindness, coronary artery disease, and death. Intensive lifestyle modification including diet, exercise and weight loss are the first line of treatment for diabetes.   5. Obesity, current BMI 35.9 Gerald Evans is currently in the action stage of change. As such, his goal is to continue with weight loss efforts. He has agreed to the Category 2 Plan.   We will review labs from 03/04/2021. Brandom will continue to adhere closely to the plan 90%.  Exercise goals:  Gerald Evans will start to walk.  Behavioral modification strategies: increasing lean protein intake, decreasing simple carbohydrates, increasing vegetables, increasing water intake, decreasing eating out, no skipping meals, meal planning and cooking strategies, keeping healthy foods in the home, and planning for success.  Gerald Evans has agreed to follow-up with our clinic  in 2 weeks. He was informed of the importance of frequent follow-up visits to maximize his success with  intensive lifestyle modifications for his multiple health conditions.   Objective:   Pulse 63, temperature 98.1 F (36.7 C), height 5' 8"  (1.727 m), weight 235 lb (106.6 kg), SpO2 95 %. Body mass index is 35.73 kg/m.  General: Cooperative, alert, well developed, in no acute distress. HEENT: Conjunctivae and lids unremarkable. Cardiovascular: Regular rhythm.  Lungs: Normal work of breathing. Neurologic: No focal deficits.   Lab Results  Component Value Date   CREATININE 1.32 (H) 01/10/2021   BUN 21 01/10/2021   NA 138 01/10/2021   K 3.8 01/10/2021   CL 99 01/10/2021   CO2 25 01/10/2021   Lab Results  Component Value Date   ALT 11 01/10/2021   AST 11 01/10/2021   ALKPHOS 87 01/10/2021   BILITOT 0.3 01/10/2021   Lab Results  Component Value Date   HGBA1C 9.3 (A) 01/13/2021   HGBA1C 9.3 (A) 10/21/2020   HGBA1C 9.6 (H) 07/17/2020   HGBA1C 8.8 (A) 03/20/2020   HGBA1C 8.5 10/10/2019   No results found for: INSULIN Lab Results  Component Value Date   TSH 5.560 (H) 03/04/2021   Lab Results  Component Value Date   CHOL 140 03/04/2021   HDL 42 03/04/2021   LDLCALC 77 03/04/2021   TRIG 118 03/04/2021   CHOLHDL 3.1 03/18/2020   Lab Results  Component Value Date   VD25OH 34.1 03/04/2021   Lab Results  Component Value Date   WBC 13.6 (H) 08/21/2020   HGB 11.8 (L) 08/21/2020   HCT 35.7 (L) 08/21/2020   MCV 73.6 (L) 08/21/2020   PLT 198 08/21/2020   Lab Results  Component Value Date   IRON 68 05/25/2016   TIBC 279 05/25/2016   FERRITIN 187 05/25/2016    Obesity Behavioral Intervention:   Approximately 15 minutes were spent on the discussion below.  ASK: We discussed the diagnosis of obesity with Burton Apley today and Lynard agreed to give [place] permission to discuss obesity behavioral modification therapy today.  ASSESS: Ryzen has the diagnosis of obesity and his BMI today is 35.9. [place] is in the action stage of change.   ADVISE: Dre was educated on  the multiple health risks of obesity as well as the benefit of weight loss to improve his health. He was advised of the need for long term treatment and the importance of lifestyle modifications to improve his current health and to decrease his risk of future health problems.  AGREE: Multiple dietary modification options and treatment options were discussed and Iverson agreed to follow the recommendations documented in the above note.  ARRANGE: Jatinder was educated on the importance of frequent visits to treat obesity as outlined per CMS and USPSTF guidelines and agreed to schedule his next follow up appointment today.  Attestation Statements:   Reviewed by clinician on day of visit: allergies, medications, problem list, medical history, surgical history, family history, social history, and previous encounter notes.  I, Lizbeth Bark, RMA, am acting as Location manager for [employer], DO.   I have reviewed the above documentation for accuracy and completeness, and I agree with the above. Jearld Lesch, DO

## 2021-04-02 ENCOUNTER — Encounter (INDEPENDENT_AMBULATORY_CARE_PROVIDER_SITE_OTHER): Payer: Self-pay | Admitting: Bariatrics

## 2021-04-09 ENCOUNTER — Other Ambulatory Visit: Payer: Self-pay | Admitting: "Endocrinology

## 2021-04-09 DIAGNOSIS — E1165 Type 2 diabetes mellitus with hyperglycemia: Secondary | ICD-10-CM

## 2021-04-14 NOTE — Patient Instructions (Signed)

## 2021-04-15 ENCOUNTER — Ambulatory Visit (INDEPENDENT_AMBULATORY_CARE_PROVIDER_SITE_OTHER): Payer: Medicare HMO | Admitting: Nurse Practitioner

## 2021-04-15 ENCOUNTER — Other Ambulatory Visit: Payer: Self-pay

## 2021-04-15 ENCOUNTER — Encounter: Payer: Self-pay | Admitting: Nurse Practitioner

## 2021-04-15 VITALS — BP 152/69 | HR 98 | Ht 68.0 in | Wt 239.8 lb

## 2021-04-15 DIAGNOSIS — I10 Essential (primary) hypertension: Secondary | ICD-10-CM

## 2021-04-15 DIAGNOSIS — E1165 Type 2 diabetes mellitus with hyperglycemia: Secondary | ICD-10-CM | POA: Diagnosis not present

## 2021-04-15 DIAGNOSIS — E782 Mixed hyperlipidemia: Secondary | ICD-10-CM

## 2021-04-15 LAB — POCT UA - MICROALBUMIN
Creatinine, POC: 300 mg/dL
Microalbumin Ur, POC: 150 mg/L

## 2021-04-15 LAB — POCT GLYCOSYLATED HEMOGLOBIN (HGB A1C): HbA1c, POC (controlled diabetic range): 9.7 % — AB (ref 0.0–7.0)

## 2021-04-15 MED ORDER — NOVOLIN 70/30 FLEXPEN (70-30) 100 UNIT/ML ~~LOC~~ SUPN: 55.0000 [IU] | PEN_INJECTOR | Freq: Two times a day (BID) | SUBCUTANEOUS | 3 refills | Status: DC

## 2021-04-15 NOTE — Progress Notes (Addendum)
04/15/2021, 10:43 AM  Endocrinology follow-up note   Subjective:    Patient ID: Gerald Evans, male    DOB: [birth year].  Gerald Evans is being seen in follow-up after he was seen in consultation for management of currently uncontrolled symptomatic diabetes requested by  Rosita Fire, MD.   Past Medical History:  Diagnosis Date   Colon polyps    Diabetes mellitus without complication ([place])    Edema of both lower extremities    Fatty liver    Hypercholesteremia    Hypertension     Past Surgical History:  Procedure Laterality Date   COLONOSCOPY     COLONOSCOPY  04/21/2012   Procedure: COLONOSCOPY;  Surgeon: Rogene Houston, MD;  [facility]: [facility];  Service: Endoscopy;  Laterality: N/A;  1200   COLONOSCOPY N/A 09/09/2017   Procedure: COLONOSCOPY;  Surgeon: Rogene Houston, MD;  Location: [facility];  Service: Endoscopy;  Laterality: N/A;  1200-rescheduled to 3/14/ @12 :00pm per [place]     POLYPECTOMY  09/09/2017   Procedure: POLYPECTOMY;  Surgeon: Rogene Houston, MD;  Location: [facility];  Service: Endoscopy;;  Transverse Colon (CB)    Social History   Socioeconomic History   Marital status: Married    Spouse name: Not on file   Number of children: 2   Years of education: Not on file   Highest education level: Not on file  Occupational History   Occupation: retired  Tobacco Use   Smoking status: Former    Packs/day: 0.50    Years: 20.00    Pack years: 10.00    Types: Cigarettes   Smokeless tobacco: Never  Vaping Use   Vaping Use: Never used  Substance and Sexual Activity   Alcohol use: Yes    Alcohol/week: 4.0 standard drinks    Types: 4 Standard drinks or equivalent per week   Drug use: No   Sexual activity: Not on file  Other Topics Concern   Not on file  Social History Narrative   Not on file   Social Determinants of Health   Financial  Resource Strain: Not on file  Food Insecurity: Not on file  Transportation Needs: Not on file  Physical Activity: Not on file  Stress: Not on file  Social Connections: Not on file    Family History  Problem Relation Age of Onset   Hypertension Mother    Hypertension Father    Alcoholism Father    Obesity Father    Diabetes Sister    Hypertension Sister    Healthy Daughter    Healthy Son    Colon cancer Neg Hx     Outpatient Encounter Medications as of 04/15/2021  Medication Sig   amLODipine (NORVASC) 10 MG tablet Take 10 mg by mouth daily.   blood glucose meter kit and supplies Dispense based on patient and insurance preference. Use up to four times daily as directed. (FOR ICD-10 E11.65)   cloNIDine (CATAPRES) 0.2 MG tablet Take 1 tablet by mouth twice daily   glucose blood (ONETOUCH ULTRA) test strip Use 1 strip to check glucose 3 times daily as  directed.   hydrochlorothiazide (HYDRODIURIL) 25 MG tablet Take 25 mg by mouth daily.   losartan (COZAAR) 100 MG tablet Take 100 mg by mouth daily.   metFORMIN (GLUCOPHAGE) 1000 MG tablet Take 1,000 mg by mouth daily with breakfast.   metoprolol (LOPRESSOR) 100 MG tablet Take 100 mg by mouth 2 (two) times daily.    OneTouch Delica Lancets 26R MISC 1 each by Does not apply route 3 (three) times daily. Use to check blood glucose three times daily   sildenafil (VIAGRA) 100 MG tablet Take 100 mg by mouth daily as needed.   simvastatin (ZOCOR) 20 MG tablet Take 20 mg by mouth daily.   Vitamin D, Ergocalciferol, (DRISDOL) 1.25 MG (50000 UNIT) CAPS capsule Take 1 capsule (50,000 Units total) by mouth every 7 (seven) days.   [DISCONTINUED] insulin isophane & regular human (NOVOLIN 70/30 FLEXPEN) (70-30) 100 UNIT/ML KwikPen Inject 32 Units into the skin 2 (two) times daily with a meal. Inject 24 units with breakfast and 24 units with supper if glucose is above 90 and you are eating. (Patient taking differently: Inject 36 Units into the skin 2 (two)  times daily with a meal.)   insulin isophane & regular human KwikPen (NOVOLIN 70/30 KWIKPEN) (70-30) 100 UNIT/ML KwikPen Inject 55 Units into the skin 2 (two) times daily with a meal.   No facility-administered encounter medications on file as of 04/15/2021.    ALLERGIES: Allergies  Allergen Reactions   Lisinopril     cough    VACCINATION STATUS: Immunization History  Administered Date(s) Administered   PFIZER Comirnaty(Gray Top)Covid-19 Tri-Sucrose Vaccine 11/12/2020   PFIZER(Purple Top)SARS-COV-2 Vaccination 08/20/2019, 09/12/2019, 04/25/2020    Diabetes He presents for his follow-up diabetic visit. He has type 2 diabetes mellitus. Onset time: He was diagnosed at approximate age of 24 years. His disease course has been worsening. There are no hypoglycemic associated symptoms. Pertinent negatives for hypoglycemia include no confusion, headaches, pallor or seizures. Pertinent negatives for diabetes include no chest pain, no fatigue, no polydipsia, no polyphagia, no polyuria, no weakness and no weight loss. There are no hypoglycemic complications. Symptoms are stable. Diabetic complications include heart disease, impotence and nephropathy. Risk factors for coronary artery disease include dyslipidemia, diabetes mellitus, family history, tobacco exposure, sedentary lifestyle, male sex, obesity and hypertension. Current diabetic treatment includes oral agent (monotherapy) and insulin injections. He is compliant with treatment most of the time. His weight is fluctuating minimally. He is following a generally unhealthy diet. When asked about meal planning, he reported none. He has not had a previous visit with a dietitian. He rarely participates in exercise. His home blood glucose trend is increasing steadily. His overall blood glucose range is >200 mg/dl. (He presents today with his meter, no logs, showing above target fasting and postprandial glycemic profile and inconsistent monitoring pattern.   His POCT A1c today is 9.7%, increasing from last visit of 9.3%.  Analysis of his meter shows 7-day average of 280 with 3 readings; 14-day average of 261 with 12 readings; and 30-day average of 250 with 13 readings.  He has been traveling more in recent months but denies eating concentrated sweets or sweet drinks.  He denies any hypoglycemia.) An ACE inhibitor/angiotensin II receptor blocker is being taken. He does not see a podiatrist.Eye exam is current.  Hyperlipidemia This is a chronic problem. The current episode started more than 1 year ago. The problem is controlled. Recent lipid tests were reviewed and are normal. Exacerbating diseases include chronic renal disease, diabetes  and obesity. Factors aggravating his hyperlipidemia include fatty foods, beta blockers and thiazides. Pertinent negatives include no chest pain, myalgias or shortness of breath. Current antihyperlipidemic treatment includes statins. The current treatment provides mild improvement of lipids. Compliance problems include adherence to diet and adherence to exercise.  Risk factors for coronary artery disease include diabetes mellitus, dyslipidemia, hypertension, male sex, obesity, a sedentary lifestyle and family history.  Hypertension This is a chronic problem. The current episode started more than 1 year ago. The problem has been waxing and waning since onset. The problem is uncontrolled. Pertinent negatives include no chest pain, headaches, neck pain, palpitations or shortness of breath. There are no associated agents to hypertension. Risk factors for coronary artery disease include dyslipidemia, diabetes mellitus, male gender, obesity and smoking/tobacco exposure. Past treatments include angiotensin blockers, diuretics, beta blockers and central alpha agonists. The current treatment provides mild improvement. There are no compliance problems.  Hypertensive end-organ damage includes kidney disease and CAD/MI. Identifiable causes of  hypertension include chronic renal disease.   Review of systems  Constitutional: + Minimally fluctuating body weight,  current Body mass index is 36.46 kg/m. , no fatigue, no subjective hyperthermia, no subjective hypothermia Eyes: no blurry vision, no xerophthalmia ENT: no sore throat, no nodules palpated in throat, no dysphagia/odynophagia, no hoarseness Cardiovascular: no chest pain, no shortness of breath, no palpitations, no leg swelling Respiratory: no cough, no shortness of breath Gastrointestinal: no nausea/vomiting/diarrhea Musculoskeletal: no muscle/joint aches Skin: no rashes, no hyperemia Neurological: no tremors, no numbness, no tingling, no dizziness Psychiatric: no depression, no anxiety  Objective:    BP (!) 152/69   Pulse 98   Ht 5' 8"  (1.727 m)   Wt 239 lb 12.8 oz (108.8 kg)   BMI 36.46 kg/m   Wt Readings from Last 3 Encounters:  04/15/21 239 lb 12.8 oz (108.8 kg)  04/01/21 235 lb (106.6 kg)  03/04/21 242 lb (109.8 kg)    BP Readings from Last 3 Encounters:  04/15/21 (!) 152/69  04/01/21 (!) 148/76  03/04/21 125/66     Physical Exam- Limited  Constitutional:  Body mass index is 36.46 kg/m. , not in acute distress, normal state of mind Eyes:  EOMI, no exophthalmos Neck: Supple Cardiovascular: RRR, no murmurs, rubs, or gallops, no edema Respiratory: Adequate breathing efforts, no crackles, rales, rhonchi, or wheezing Musculoskeletal: no gross deformities, strength intact in all four extremities, no gross restriction of joint movements Skin:  no rashes, no hyperemia Neurological: no tremor with outstretched hands     CMP ( most recent) CMP     Component Value Date/Time   NA 138 01/10/2021 1010   K 3.8 01/10/2021 1010   CL 99 01/10/2021 1010   CO2 25 01/10/2021 1010   GLUCOSE 194 (H) 01/10/2021 1010   GLUCOSE 234 (H) 08/21/2020 1437   BUN 21 01/10/2021 1010   CREATININE 1.32 (H) 01/10/2021 1010   CREATININE 1.00 03/18/2020 1321   CALCIUM  10.0 01/10/2021 1010   PROT 6.9 01/10/2021 1010   ALBUMIN 4.3 01/10/2021 1010   AST 11 01/10/2021 1010   ALT 11 01/10/2021 1010   ALKPHOS 87 01/10/2021 1010   BILITOT 0.3 01/10/2021 1010   GFRNONAA >60 08/21/2020 1437   GFRNONAA 74 03/18/2020 1321   GFRAA 67 07/17/2020 0953   GFRAA 86 03/18/2020 1321    Diabetic Labs (most recent): Lab Results  Component Value Date   HGBA1C 9.7 (A) 04/15/2021   HGBA1C 9.3 (A) 01/13/2021   HGBA1C 9.3 (A) 10/21/2020  Assessment & Plan:   1) Uncontrolled type 2 diabetes mellitus with hyperglycemia ([place])  - Gerald Evans has currently uncontrolled symptomatic type 2 DM since  75 years of age.  He presents today with his meter, no logs, showing above target fasting and postprandial glycemic profile and inconsistent monitoring pattern.  His POCT A1c today is 9.7%, increasing from last visit of 9.3%.  Analysis of his meter shows 7-day average of 280 with 3 readings; 14-day average of 261 with 12 readings; and 30-day average of 250 with 13 readings.  He has been traveling more in recent months but denies eating concentrated sweets or sweet drinks.  He denies any hypoglycemia.   Recent labs reviewed.  His POCT UM shows microalbuminuria at 150.  Will repeat CMP before next visit.  - I had a long discussion with him about the progressive nature of diabetes and the pathology behind its complications.  -his diabetes is complicated by obesity/sedentary life and he remains at a high risk for more acute and chronic complications which include CAD, CVA, CKD, retinopathy, and neuropathy. These are all discussed in detail with him.  - Nutritional counseling repeated at each appointment due to patients tendency to fall back in to old habits.  - The patient admits there is a room for improvement in their diet and drink choices. -  Suggestion is made for the patient to avoid simple carbohydrates from their diet including Cakes, Sweet Desserts / Pastries, Ice  Cream, Soda (diet and regular), Sweet Tea, Candies, Chips, Cookies, Sweet Pastries, Store Bought Juices, Alcohol in Excess of 1-2 drinks a day, Artificial Sweeteners, Coffee Creamer, and "Sugar-free" Products. This will help patient to have stable blood glucose profile and potentially avoid unintended weight gain.   - I encouraged the patient to switch to unprocessed or minimally processed complex starch and increased protein intake (animal or plant source), fruits, and vegetables.   - Patient is advised to stick to a routine mealtimes to eat 3 meals a day and avoid unnecessary snacks (to snack only to correct hypoglycemia).  - I have approached him with the following individualized plan to manage  his diabetes and patient agrees:   - he will continue to require insulin treatment in order for him to achieve and maintain control of diabetes to target.    -He is benefiting from simplified insulin regimen with premixed insulin.    -Based on his hyperglycemia, he will tolerate increase in his 70/30 to 55 units SQ BID with meals if glucose is above 90 and he is eating.  Due to decline in renal function, will lower his dose of Metformin to 1000 mg po once daily with breakfast (may consider reducing it further at next visit based on repeat CMP).    -He is encouraged to do better monitoring his blood glucose.  He is urged to monitor sugar at least 3 times per day, before injecting insulin (at breakfast and supper) and at bedtime and call the clinic if readings are less than 70 or greater than 300 for 3 tests in a row.    He will be considered for incretin therapy as needed on subsequent visits.  - Specific targets for  A1c;  LDL, HDL,  and Triglycerides were discussed with the patient.  2) Blood Pressure /Hypertension:  His blood pressure is not controlled to target.  He is advised to continue Clonidine 0.2 mg po twice daily, HCTZ 25 mg po daily, Losartan 100 mg po daily, and Metoprolol 100  mg po twice  daily.    3) Lipids/Hyperlipidemia:  His most recent lipid panel from 03/04/21 shows controlled LDL of 77.  He is advised to continue Simvastatin 20 mg po daily at bedtime.  Side effects and precautions discussed with him.    4)  Weight/Diet:  His Body mass index is 36.46 kg/m.  -   clearly complicating his diabetes care.   he is  a candidate for weight loss. I discussed with him the fact that loss of 5 - 10% of his  current body weight will have the most impact on his diabetes management.  Exercise, and detailed carbohydrates information provided  -  detailed on discharge instructions.  5) Abnormal thyroid labs He does not have personal or family history of thyroid dysfunction.  Will repeat TFTs prior to next visit and if still abnormal, will proceed with further workup.  6) Chronic Care/Health Maintenance: -he is on ACEI/ARB and Statin medications and  is encouraged to initiate and continue to follow up with Ophthalmology, Dentist,  Podiatrist at least yearly or according to recommendations, and advised to  stay away from smoking. I have recommended yearly flu vaccine and pneumonia vaccine at least every 5 years; moderate intensity exercise for up to 150 minutes weekly; and  sleep for at least 7 hours a day.  - he is  advised to maintain close follow up with Rosita Fire, MD for primary care needs, as well as his other providers for optimal and coordinated care.      I spent 43 minutes in the care of the patient today including review of labs from [place], Lipids, Thyroid Function, Hematology (current and previous including abstractions from other facilities); face-to-face time discussing  his blood glucose readings/logs, discussing hypoglycemia and hyperglycemia episodes and symptoms, medications doses, his options of short and long term treatment based on the latest standards of care / guidelines;  discussion about incorporating lifestyle medicine;  and documenting the encounter.    Please  refer to Patient Instructions for Blood Glucose Monitoring and Insulin/Medications Dosing Guide"  in media tab for additional information. Please  also refer to " Patient Self Inventory" in the Media  tab for reviewed elements of pertinent patient history.  [name] participated in the discussions, expressed understanding, and voiced agreement with the above plans.  All questions were answered to his satisfaction. he is encouraged to contact clinic should he have any questions or concerns prior to his return visit.  Follow up plan: - Return in about 3 months (around 07/16/2021) for Diabetes F/U with A1c in office, Previsit labs, Bring meter and logs.  Rayetta Pigg, [facility] [address] [place], [place] [address] Phone: [phone] Fax: [phone]  04/15/2021, 10:43 AM

## 2021-04-21 ENCOUNTER — Ambulatory Visit (INDEPENDENT_AMBULATORY_CARE_PROVIDER_SITE_OTHER): Payer: Medicare HMO | Admitting: Bariatrics

## 2021-04-21 ENCOUNTER — Other Ambulatory Visit: Payer: Self-pay

## 2021-04-21 ENCOUNTER — Encounter (INDEPENDENT_AMBULATORY_CARE_PROVIDER_SITE_OTHER): Payer: Self-pay | Admitting: Bariatrics

## 2021-04-21 VITALS — BP 133/68 | HR 56 | Temp 97.9°F | Ht 68.0 in | Wt 238.0 lb

## 2021-04-21 DIAGNOSIS — E1159 Type 2 diabetes mellitus with other circulatory complications: Secondary | ICD-10-CM | POA: Diagnosis not present

## 2021-04-21 DIAGNOSIS — I152 Hypertension secondary to endocrine disorders: Secondary | ICD-10-CM | POA: Diagnosis not present

## 2021-04-21 DIAGNOSIS — E1169 Type 2 diabetes mellitus with other specified complication: Secondary | ICD-10-CM

## 2021-04-21 DIAGNOSIS — Z6836 Body mass index (BMI) 36.0-36.9, adult: Secondary | ICD-10-CM

## 2021-04-21 DIAGNOSIS — E785 Hyperlipidemia, unspecified: Secondary | ICD-10-CM

## 2021-04-21 NOTE — Progress Notes (Signed)
Chief Complaint:   OBESITY Gerald Evans is here to discuss his progress with his obesity treatment plan along with follow-up of his obesity related diagnoses. Gerald Evans is on the Category 2 Plan and states he is following his eating plan approximately 80% of the time. Gerald Evans states he is not currently exercising.  Today's visit was #: 4 Starting weight: 242 lbs Starting date: 03/04/2021 Today's weight: 238 lbs Today's date: 04/21/2021 Total lbs lost to date: 4 Total lbs lost since last in-office visit: 0  Interim History: Gerald Evans is up 3 additional pounds since his last visit. According to the bioimpedence scale, his body water is up but fat percentage is down.  Subjective:   1. Hypertension associated with diabetes ([place]) Gerald Evans's BP is controlled.  2. Hyperlipidemia associated with type 2 diabetes mellitus (Gerald Evans) Gerald Evans is taking Zocor as directed.  Assessment/Plan:   1. Hypertension associated with diabetes ([place]) Gerald Evans is working on healthy weight loss and exercise to improve blood pressure control. We will watch for signs of hypotension as he continues his lifestyle modifications. Continue current treatment plan.  2. Hyperlipidemia associated with type 2 diabetes mellitus ([place]) Cardiovascular risk and specific lipid/LDL goals reviewed.  We discussed several lifestyle modifications today and Gerald Evans will continue to work on diet, exercise and weight loss efforts. Orders and follow up as documented in patient record. Continue current treatment plan.  Counseling Intensive lifestyle modifications are the first line treatment for this issue. Dietary changes: Increase soluble fiber. Decrease simple carbohydrates. Exercise changes: Moderate to vigorous-intensity aerobic activity 150 minutes per week if tolerated. Lipid-lowering medications: see documented in medical record.  3. Obesity, current BMI 36.2  Gerald Evans is currently in the action stage of change. As such, his goal is to  continue with weight loss efforts. He has agreed to the Category 2 Plan.   Meal planning. Intentional eating. Gerald Evans will adhere closely to the plan 80-90% of the time. Increase water intake.  Exercise goals:  Gerald Evans will start to walk.  Behavioral modification strategies: increasing lean protein intake, decreasing simple carbohydrates, increasing vegetables, increasing water intake, decreasing eating out, no skipping meals, meal planning and cooking strategies, keeping healthy foods in the home, and planning for success.  Gerald Evans has agreed to follow-up with our clinic in 2 weeks. He was informed of the importance of frequent follow-up visits to maximize his success with intensive lifestyle modifications for his multiple health conditions.   Objective:   Blood pressure 133/68, pulse (!) 56, temperature 97.9 F (36.6 C), height 5' 8"  (1.727 m), weight 238 lb (108 kg), SpO2 98 %. Body mass index is 36.19 kg/m.  General: Cooperative, alert, well developed, in no acute distress. HEENT: Conjunctivae and lids unremarkable. Cardiovascular: Regular rhythm.  Lungs: Normal work of breathing. Neurologic: No focal deficits.   Lab Results  Component Value Date   CREATININE 1.32 (H) 01/10/2021   BUN 21 01/10/2021   NA 138 01/10/2021   K 3.8 01/10/2021   CL 99 01/10/2021   CO2 25 01/10/2021   Lab Results  Component Value Date   ALT 11 01/10/2021   AST 11 01/10/2021   ALKPHOS 87 01/10/2021   BILITOT 0.3 01/10/2021   Lab Results  Component Value Date   HGBA1C 9.7 (A) 04/15/2021   HGBA1C 9.3 (A) 01/13/2021   HGBA1C 9.3 (A) 10/21/2020   HGBA1C 9.6 (H) 07/17/2020   HGBA1C 8.8 (A) 03/20/2020   No results found for: INSULIN Lab Results  Component Value Date   TSH  5.560 (H) 03/04/2021   Lab Results  Component Value Date   CHOL 140 03/04/2021   HDL 42 03/04/2021   LDLCALC 77 03/04/2021   TRIG 118 03/04/2021   CHOLHDL 3.1 03/18/2020   Lab Results  Component Value Date   VD25OH 34.1  03/04/2021   Lab Results  Component Value Date   WBC 13.6 (H) 08/21/2020   HGB 11.8 (L) 08/21/2020   HCT 35.7 (L) 08/21/2020   MCV 73.6 (L) 08/21/2020   PLT 198 08/21/2020   Lab Results  Component Value Date   IRON 68 05/25/2016   TIBC 279 05/25/2016   FERRITIN 187 05/25/2016     Attestation Statements:   Reviewed by clinician on day of visit: allergies, medications, problem list, medical history, surgical history, family history, social history, and previous encounter notes.  Coral Ceo, CMA, am acting as Location manager for [employer], DO.  I have reviewed the above documentation for accuracy and completeness, and I agree with the above. Jearld Lesch, DO

## 2021-04-22 ENCOUNTER — Encounter (INDEPENDENT_AMBULATORY_CARE_PROVIDER_SITE_OTHER): Payer: Self-pay | Admitting: Bariatrics

## 2021-05-06 ENCOUNTER — Encounter (INDEPENDENT_AMBULATORY_CARE_PROVIDER_SITE_OTHER): Payer: Self-pay | Admitting: Bariatrics

## 2021-05-06 ENCOUNTER — Other Ambulatory Visit: Payer: Self-pay

## 2021-05-06 ENCOUNTER — Ambulatory Visit (INDEPENDENT_AMBULATORY_CARE_PROVIDER_SITE_OTHER): Payer: Medicare HMO | Admitting: Bariatrics

## 2021-05-06 VITALS — BP 96/62 | HR 53 | Temp 97.4°F | Ht 68.0 in | Wt 237.0 lb

## 2021-05-06 DIAGNOSIS — E785 Hyperlipidemia, unspecified: Secondary | ICD-10-CM

## 2021-05-06 DIAGNOSIS — F5089 Other specified eating disorder: Secondary | ICD-10-CM | POA: Diagnosis not present

## 2021-05-06 DIAGNOSIS — E559 Vitamin D deficiency, unspecified: Secondary | ICD-10-CM

## 2021-05-06 DIAGNOSIS — E669 Obesity, unspecified: Secondary | ICD-10-CM

## 2021-05-06 DIAGNOSIS — E1169 Type 2 diabetes mellitus with other specified complication: Secondary | ICD-10-CM | POA: Diagnosis not present

## 2021-05-06 DIAGNOSIS — Z6836 Body mass index (BMI) 36.0-36.9, adult: Secondary | ICD-10-CM

## 2021-05-06 MED ORDER — BUPROPION HCL ER (SR) 150 MG PO TB12: 150.0000 mg | ORAL_TABLET | Freq: Every day | ORAL | 0 refills | Status: DC

## 2021-05-06 MED ORDER — VITAMIN D (ERGOCALCIFEROL) 1.25 MG (50000 UNIT) PO CAPS: 50000.0000 [IU] | ORAL_CAPSULE | ORAL | 0 refills | Status: DC

## 2021-05-06 NOTE — Progress Notes (Signed)
Chief Complaint:   OBESITY Burnard is here to discuss his progress with his obesity treatment plan along with follow-up of his obesity related diagnoses. Kinte is on the Category 2 Plan and states he is following his eating plan approximately 80% of the time. Brittain states he is doing 0 minutes 0 times per week.  Today's visit was #: 5 Starting weight: 242 lbs Starting date: 03/04/2021 Today's weight: 237 lbs Today's date: 05/06/2021 Total lbs lost to date: 5 lbs Total lbs lost since last in-office visit: 1 lb  Interim History: Abraham is down 1 lb since his last visit. He is not feeling that well today and gaining water at night. He is fairly well now. He struggles with some boredom eating.   Subjective:   1. Vitamin D insufficiency Jaidev is taking Vitamin D as directed.   2. Hyperlipidemia associated with type 2 diabetes mellitus ([place]) Drexler is taking Zocor currently.  3. Diabetes mellitus type 2 in obese [facility]) Daiel is currently taking Humulin 70/30 mix and Metformin. His last blood sugar was 160. His fasting blood sugar was 130's-140's. He denies low sugar. He denies any further symptoms at this time and is feeling well this AM.  4. Other disorder of eating Chino notes boredom eating.   Assessment/Plan:   1. Vitamin D insufficiency Low Vitamin D level contributes to fatigue and are associated with obesity, breast, and colon cancer. We will refill prescription Vitamin D 50,000 IU every week for 1 month with no refills and Mordche will follow-up for routine testing of Vitamin D, at least 2-3 times per year to avoid over-replacement.  - Vitamin D, Ergocalciferol, (DRISDOL) 1.25 MG (50000 UNIT) CAPS capsule; Take 1 capsule (50,000 Units total) by mouth every 7 (seven) days.  Dispense: 4 capsule; Refill: 0  2. Hyperlipidemia associated with type 2 diabetes mellitus ([place]) Cardiovascular risk and specific lipid/LDL goals reviewed.  We discussed several lifestyle  modifications today and Jahsiah will continue to work on diet, exercise and weight loss efforts. Abdulmalik will continue his medications. Orders and follow up as documented in patient record.   Counseling Intensive lifestyle modifications are the first line treatment for this issue. Dietary changes: Increase soluble fiber. Decrease simple carbohydrates. Exercise changes: Moderate to vigorous-intensity aerobic activity 150 minutes per week if tolerated. Lipid-lowering medications: see documented in medical record.   3. Diabetes mellitus type 2 in obese [facility]) Corday will continue his medications. Good blood sugar control is important to decrease the likelihood of diabetic complications such as nephropathy, neuropathy, limb loss, blindness, coronary artery disease, and death. Intensive lifestyle modification including diet, exercise and weight loss are the first line of treatment for diabetes.   4. Other disorder of eating Behavior modification techniques were discussed today to help Romone deal with his emotional/non-hunger eating behaviors.  We will refill Wellbutrin 150 mg for 1 month with no refills. Orders and follow up as documented in patient record.    - buPROPion (WELLBUTRIN SR) 150 MG 12 hr tablet; Take 1 tablet (150 mg total) by mouth daily.  Dispense: 30 tablet; Refill: 0  5. Class 2 severe obesity with serious comorbidity and body mass index (BMI) of 36.0 to 36.9 in adult, unspecified obesity type [facility]) Deckard is currently in the action stage of change. As such, his goal is to continue with weight loss efforts. He has agreed to the Category 2 Plan.   Jarae will continue meal planning and mindful eating. He will adhere closely to the  plan. He will increase his water intake.  Exercise goals: No exercise has been prescribed at this time.  Behavioral modification strategies: increasing lean protein intake, decreasing simple carbohydrates, increasing vegetables, increasing water  intake, decreasing eating out, no skipping meals, meal planning and cooking strategies, keeping healthy foods in the home, and planning for success.  Hikeem has agreed to follow-up with our clinic in 2-3 weeks. He was informed of the importance of frequent follow-up visits to maximize his success with intensive lifestyle modifications for his multiple health conditions.   Objective:   Blood pressure 96/62, pulse (!) 53, temperature (!) 97.4 F (36.3 C), height 5' 8"  (1.727 m), weight 237 lb (107.5 kg), SpO2 97 %. Body mass index is 36.04 kg/m.  General: Cooperative, alert, well developed, in no acute distress. HEENT: Conjunctivae and lids unremarkable. Cardiovascular: Regular rhythm.  Lungs: Normal work of breathing. Neurologic: No focal deficits.   Lab Results  Component Value Date   CREATININE 1.32 (H) 01/10/2021   BUN 21 01/10/2021   NA 138 01/10/2021   K 3.8 01/10/2021   CL 99 01/10/2021   CO2 25 01/10/2021   Lab Results  Component Value Date   ALT 11 01/10/2021   AST 11 01/10/2021   ALKPHOS 87 01/10/2021   BILITOT 0.3 01/10/2021   Lab Results  Component Value Date   HGBA1C 9.7 (A) 04/15/2021   HGBA1C 9.3 (A) 01/13/2021   HGBA1C 9.3 (A) 10/21/2020   HGBA1C 9.6 (H) 07/17/2020   HGBA1C 8.8 (A) 03/20/2020   No results found for: INSULIN Lab Results  Component Value Date   TSH 5.560 (H) 03/04/2021   Lab Results  Component Value Date   CHOL 140 03/04/2021   HDL 42 03/04/2021   LDLCALC 77 03/04/2021   TRIG 118 03/04/2021   CHOLHDL 3.1 03/18/2020   Lab Results  Component Value Date   VD25OH 34.1 03/04/2021   Lab Results  Component Value Date   WBC 13.6 (H) 08/21/2020   HGB 11.8 (L) 08/21/2020   HCT 35.7 (L) 08/21/2020   MCV 73.6 (L) 08/21/2020   PLT 198 08/21/2020   Lab Results  Component Value Date   IRON 68 05/25/2016   TIBC 279 05/25/2016   FERRITIN 187 05/25/2016   Attestation Statements:   Reviewed by clinician on day of visit: allergies,  medications, problem list, medical history, surgical history, family history, social history, and previous encounter notes.  I, Lizbeth Bark, RMA, am acting as Location manager for [employer], DO.   I have reviewed the above documentation for accuracy and completeness, and I agree with the above. Jearld Lesch, DO

## 2021-05-07 DIAGNOSIS — E6609 Other obesity due to excess calories: Secondary | ICD-10-CM | POA: Insufficient documentation

## 2021-05-07 DIAGNOSIS — Z6836 Body mass index (BMI) 36.0-36.9, adult: Secondary | ICD-10-CM | POA: Insufficient documentation

## 2021-05-27 ENCOUNTER — Encounter (INDEPENDENT_AMBULATORY_CARE_PROVIDER_SITE_OTHER): Payer: Self-pay | Admitting: Bariatrics

## 2021-05-27 ENCOUNTER — Other Ambulatory Visit: Payer: Self-pay

## 2021-05-27 ENCOUNTER — Ambulatory Visit (INDEPENDENT_AMBULATORY_CARE_PROVIDER_SITE_OTHER): Payer: Medicare HMO | Admitting: Bariatrics

## 2021-05-27 VITALS — BP 100/74 | HR 86 | Temp 98.1°F | Ht 68.0 in | Wt 237.0 lb

## 2021-05-27 DIAGNOSIS — Z6836 Body mass index (BMI) 36.0-36.9, adult: Secondary | ICD-10-CM | POA: Diagnosis not present

## 2021-05-27 DIAGNOSIS — F5089 Other specified eating disorder: Secondary | ICD-10-CM | POA: Diagnosis not present

## 2021-05-27 DIAGNOSIS — E559 Vitamin D deficiency, unspecified: Secondary | ICD-10-CM

## 2021-05-27 MED ORDER — VITAMIN D (ERGOCALCIFEROL) 1.25 MG (50000 UNIT) PO CAPS: 50000.0000 [IU] | ORAL_CAPSULE | ORAL | 0 refills | Status: DC

## 2021-05-27 NOTE — Progress Notes (Signed)
Chief Complaint:   OBESITY [name] is here to discuss his progress with his obesity treatment plan along with follow-up of his obesity related diagnoses. Gerald Evans is on the Category 2 Plan and states he is following his eating plan approximately 60% of the time. Gerald Evans states he is doing 0 minutes 0 times per week.  Today's visit was #: 6 Starting weight: 242 lbs Starting date: 03/04/2021 Today's weight: 237 lbs Today's date: 05/27/2021 Total lbs lost to date: 3 lbs Total lbs lost since last in-office visit: 0  Interim History: [name] is at the same weight as his previous visit over the holidays. He is doing well with his water and fiber.  Subjective:   1. Vitamin D insufficiency Shane notes energy. She denies side effects.  2. Other disorder of eating Gerald Evans did not start the medication ( Wellbutrin ), as he wants to work on this on his own.   Assessment/Plan:   1. Vitamin D insufficiency Low Vitamin D level contributes to fatigue and are associated with obesity, breast, and colon cancer. We will refill prescription Vitamin D 50,000 IU every week for 1 month with no refills and Gerald Evans will follow-up for routine testing of Vitamin D, at least 2-3 times per year to avoid over-replacement.  - Vitamin D, Ergocalciferol, (DRISDOL) 1.25 MG (50000 UNIT) CAPS capsule; Take 1 capsule (50,000 Units total) by mouth every 7 (seven) days.  Dispense: 4 capsule; Refill: 0 - Ambulatory referral to [facility]  2. Other disorder of eating Behavior modification techniques were discussed today to help Gerald Evans deal with his emotional/non-hunger eating behaviors.  Gerald Evans will stop Wellbutrin. He is more vegetables and he will increase protein. Orders and follow up as documented in patient record.    - Ambulatory referral to [facility]  3. Obesity, current BMI 75 Gerald Evans is currently in the action stage of change. As such, his goal is to continue with weight loss efforts. He has  agreed to the Category 2 Plan.   Gerald Evans will adhere to the plan 80-90%. She will be mindful eating. A referral for primary care was sent in.  - Ambulatory referral to Family Practice  Exercise goals:  Gerald Evans has been more active. He has started back walking. He has signed up for 5k.  Behavioral modification strategies: increasing lean protein intake, decreasing simple carbohydrates, increasing vegetables, increasing water intake, decreasing eating out, no skipping meals, meal planning and cooking strategies, keeping healthy foods in the home, and planning for success.  Gerald Evans has agreed to follow-up with our clinic in 3 weeks with Gerald Marble, NP or Gerald Bathe, FNP and 6 weeks with myself. He was informed of the importance of frequent follow-up visits to maximize his success with intensive lifestyle modifications for his multiple health conditions.   Objective:   Blood pressure 100/74, pulse 86, temperature 98.1 F (36.7 C), height 5' 8"  (1.727 m), weight 237 lb (107.5 kg), SpO2 97 %. Body mass index is 36.04 kg/m.  General: Cooperative, alert, well developed, in no acute distress. HEENT: Conjunctivae and lids unremarkable. Cardiovascular: Regular rhythm.  Lungs: Normal work of breathing. Neurologic: No focal deficits.   Lab Results  Component Value Date   CREATININE 1.32 (H) 01/10/2021   BUN 21 01/10/2021   NA 138 01/10/2021   K 3.8 01/10/2021   CL 99 01/10/2021   CO2 25 01/10/2021   Lab Results  Component Value Date   ALT 11 01/10/2021   AST 11 01/10/2021   ALKPHOS 87  01/10/2021   BILITOT 0.3 01/10/2021   Lab Results  Component Value Date   HGBA1C 9.7 (A) 04/15/2021   HGBA1C 9.3 (A) 01/13/2021   HGBA1C 9.3 (A) 10/21/2020   HGBA1C 9.6 (H) 07/17/2020   HGBA1C 8.8 (A) 03/20/2020   No results found for: INSULIN Lab Results  Component Value Date   TSH 5.560 (H) 03/04/2021   Lab Results  Component Value Date   CHOL 140 03/04/2021   HDL 42 03/04/2021    LDLCALC 77 03/04/2021   TRIG 118 03/04/2021   CHOLHDL 3.1 03/18/2020   Lab Results  Component Value Date   VD25OH 34.1 03/04/2021   Lab Results  Component Value Date   WBC 13.6 (H) 08/21/2020   HGB 11.8 (L) 08/21/2020   HCT 35.7 (L) 08/21/2020   MCV 73.6 (L) 08/21/2020   PLT 198 08/21/2020   Lab Results  Component Value Date   IRON 68 05/25/2016   TIBC 279 05/25/2016   FERRITIN 187 05/25/2016   Attestation Statements:   Reviewed by clinician on day of visit: allergies, medications, problem list, medical history, surgical history, family history, social history, and previous encounter notes.  I, Lizbeth Bark, RMA, am acting as Location manager for [employer], DO.   I have reviewed the above documentation for accuracy and completeness, and I agree with the above. Jearld Lesch, DO

## 2021-06-24 ENCOUNTER — Encounter (INDEPENDENT_AMBULATORY_CARE_PROVIDER_SITE_OTHER): Payer: Self-pay | Admitting: Adult Health

## 2021-06-24 ENCOUNTER — Other Ambulatory Visit: Payer: Self-pay

## 2021-06-24 ENCOUNTER — Ambulatory Visit (INDEPENDENT_AMBULATORY_CARE_PROVIDER_SITE_OTHER): Payer: Medicare HMO | Admitting: Adult Health

## 2021-06-24 VITALS — BP 140/66 | HR 81 | Temp 97.6°F | Ht 68.0 in | Wt 237.0 lb

## 2021-06-24 DIAGNOSIS — F5089 Other specified eating disorder: Secondary | ICD-10-CM

## 2021-06-24 DIAGNOSIS — E1165 Type 2 diabetes mellitus with hyperglycemia: Secondary | ICD-10-CM | POA: Diagnosis not present

## 2021-06-24 DIAGNOSIS — Z6836 Body mass index (BMI) 36.0-36.9, adult: Secondary | ICD-10-CM

## 2021-06-24 DIAGNOSIS — F509 Eating disorder, unspecified: Secondary | ICD-10-CM | POA: Insufficient documentation

## 2021-06-24 DIAGNOSIS — E559 Vitamin D deficiency, unspecified: Secondary | ICD-10-CM | POA: Diagnosis not present

## 2021-06-24 MED ORDER — VITAMIN D (ERGOCALCIFEROL) 1.25 MG (50000 UNIT) PO CAPS: 50000.0000 [IU] | ORAL_CAPSULE | ORAL | 0 refills | Status: DC

## 2021-06-24 NOTE — Progress Notes (Signed)
Chief Complaint:   OBESITY Gerald Evans is here to discuss his progress with his obesity treatment plan along with follow-up of his obesity related diagnoses. Gerald Evans is on the Category 2 Plan and states he is following his eating plan approximately 70% of the time. Gerald Evans states he is walking for 15 minutes 2 times per week.  Today's visit was #: 7 Starting weight: 242 lbs Starting date: 03/04/2021 Today's weight: 237 lbs Today's date: 06/24/2021 Total lbs lost to date: 5 Total lbs lost since last in-office visit: 0  Interim History:  Gerald Evans stated that over the holidays, "I just kept it in check".  He follows the Category 2 plan, and will add in more fiber with more vegetables/fruit and flaxseed to oatmeal.   Subjective:   1. Vitamin D insufficiency Gerald Evans's last Vit D level was 34.1 on 03/04/2021, which is well below the goal of 50. He is on Ergocalciferol, and he denies nausea, vomiting, or muscle weakness.  2. Uncontrolled type 2 diabetes mellitus with hyperglycemia (Gerald Evans) Gerald Evans's fasting blood glucose is usually around the 200s. Post prandial of readings also around 200s.  He is on metformin 1,000 mg at breakfast and Humulin 70/30 mix at 55 units BID with meals. He denies sx's of hypoglycemia.  3. Other disorder of eating Gerald Evans was provided a prescription for bupropion SR 150 mg on 05/06/2021, but he never started it.  He prefers to exercise self control so as to not overeat.  Assessment/Plan:   1. Vitamin D insufficiency Low Vitamin D level contributes to fatigue and are associated with obesity, breast, and colon cancer. We will refill prescription Vitamin D 50,000 IU every week for 1 month. Gerald Evans will follow-up for routine testing of Vitamin D, at least 2-3 times per year to avoid over-replacement.  - Vitamin D, Ergocalciferol, (DRISDOL) 1.25 MG (50000 UNIT) CAPS capsule; Take 1 capsule (50,000 Units total) by mouth every 7 (seven) days.  Dispense: 4 capsule;  Refill: 0  2. Uncontrolled type 2 diabetes mellitus with hyperglycemia (Gerald Evans) Gerald Evans will continue metformin and Humulin 70/30. Good blood sugar control is important to decrease the likelihood of diabetic complications such as nephropathy, neuropathy, limb loss, blindness, coronary artery disease, and death. Intensive lifestyle modification including diet, exercise and weight loss are the first line of treatment for diabetes.   3. Other disorder of eating Gerald Evans agreed to remain off Bupropion. Behavior modification techniques were discussed today to help Gerald Evans deal with his emotional/non-hunger eating behaviors. Orders and follow up as documented in patient record.   4. Obesity, current BMI 35 Gerald Evans is currently in the action stage of change. As such, his goal is to continue with weight loss efforts. He has agreed to the Category 2 Plan.   Exercise goals: As is.  Behavioral modification strategies: increasing lean protein intake, decreasing simple carbohydrates, meal planning and cooking strategies, keeping healthy foods in the home, and planning for success.  Gerald Evans has agreed to follow-up with our clinic in 3 weeks. He was informed of the importance of frequent follow-up visits to maximize his success with intensive lifestyle modifications for his multiple health conditions.   Objective:   Blood pressure 140/66, pulse 81, temperature 97.6 F (36.4 C), temperature source Oral, height 5' 8"  (1.727 m), weight 237 lb (107.5 kg), SpO2 94 %. Body mass index is 36.04 kg/m.  General: Cooperative, alert, well developed, in no acute distress. HEENT: Conjunctivae and lids unremarkable. Cardiovascular: Regular rhythm.  Lungs: Normal work of breathing. Neurologic: No  focal deficits.   Lab Results  Component Value Date   CREATININE 1.32 (H) 01/10/2021   BUN 21 01/10/2021   NA 138 01/10/2021   K 3.8 01/10/2021   CL 99 01/10/2021   CO2 25 01/10/2021   Lab Results  Component Value Date    ALT 11 01/10/2021   AST 11 01/10/2021   ALKPHOS 87 01/10/2021   BILITOT 0.3 01/10/2021   Lab Results  Component Value Date   HGBA1C 9.7 (A) 04/15/2021   HGBA1C 9.3 (A) 01/13/2021   HGBA1C 9.3 (A) 10/21/2020   HGBA1C 9.6 (H) 07/17/2020   HGBA1C 8.8 (A) 03/20/2020   No results found for: INSULIN Lab Results  Component Value Date   TSH 5.560 (H) 03/04/2021   Lab Results  Component Value Date   CHOL 140 03/04/2021   HDL 42 03/04/2021   LDLCALC 77 03/04/2021   TRIG 118 03/04/2021   CHOLHDL 3.1 03/18/2020   Lab Results  Component Value Date   VD25OH 34.1 03/04/2021   Lab Results  Component Value Date   WBC 13.6 (H) 08/21/2020   HGB 11.8 (L) 08/21/2020   HCT 35.7 (L) 08/21/2020   MCV 73.6 (L) 08/21/2020   PLT 198 08/21/2020   Lab Results  Component Value Date   IRON 68 05/25/2016   TIBC 279 05/25/2016   FERRITIN 187 05/25/2016    Obesity Behavioral Intervention:   Approximately 15 minutes were spent on the discussion below.  ASK: We discussed the diagnosis of obesity with Gerald Evans today and Gerald Evans agreed to give [place] permission to discuss obesity behavioral modification therapy today.  ASSESS: Gerald Evans has the diagnosis of obesity and his BMI today is 36.0. Gerald Evans is in the action stage of change.   ADVISE: Gerald Evans was educated on the multiple health risks of obesity as well as the benefit of weight loss to improve his health. He was advised of the need for long term treatment and the importance of lifestyle modifications to improve his current health and to decrease his risk of future health problems.  AGREE: Multiple dietary modification options and treatment options were discussed and Gerald Evans agreed to follow the recommendations documented in the above note.  ARRANGE: Gerald Evans was educated on the importance of frequent visits to treat obesity as outlined per CMS and USPSTF guidelines and agreed to schedule his next follow up appointment today.  Attestation  Statements:   Reviewed by clinician on day of visit: allergies, medications, problem list, medical history, surgical history, family history, social history, and previous encounter notes.   Wilhemena Durie, am acting as transcriptionist for [name], NP.  I have reviewed the above documentation for accuracy and completeness, and I agree with the above. -  Drevon Plog d. Violetta Lavalle, NP-C

## 2021-06-29 ENCOUNTER — Other Ambulatory Visit: Payer: Self-pay | Admitting: Nurse Practitioner

## 2021-07-15 NOTE — Patient Instructions (Addendum)
Diabetes Mellitus Emergency Preparedness Plan A diabetes emergency preparedness plan is a checklist to make sure you have everything you need to manage your diabetes in case of an emergency, such as an evacuation, natural disaster, national security emergency, or pandemic lockdown. Managing your diabetes is something you have to do all day every day. The American Diabetes Association and the [employer] of Endocrinology both recommend putting together an emergency diabetes kit. Your kit should include important information and documents as well as all the supplies you will need to manage your diabetes for at least 1 week. Store it in a portable, Engineer, materials. The best time to start making your emergency kit is now. How to make your emergency kit Collect information and documents Include the following information and documents in your kit: The type of diabetes you have. A copy of your health insurance cards and photo ID. A list of all your other medical conditions, allergies, and surgeries. A list of all your medicines and doses with the contact information for your pharmacy. Ask your health care provider for a list of your current medicines. Any recent lab results, including your latest hemoglobin A1C (HbA1C). The make, model, and serial number of your insulin pump, if you use one. Also include contact information for the manufacturer. Contact information for people who should be notified in case of an emergency. Include your health care provider's name, address, and phone number. Collect diabetes care items Include the following diabetes care items in your kit: At least a 1-week supply of: Oral medicines. Insulin. Blood glucose testing supplies. These include testing strips, lancets, and extra batteries for your blood glucose monitor and pump. A charger for the continuous glucose monitor (CGM) receiver and pump. Any extra supplies needed for your CGM or pump. A supply of  glucagon, glucose tablets, juice, soda, or hard candy in case of hypoglycemia. Coolers or cold packs. A safe container for syringes, needles, and lancets.  Other preparations Other things to consider doing as part of your emergency plan: Make sure that your mobile phone is charged and that you have an extra charger, cable, or batteries. Choose a meeting place for family members. Wear a medical alert or ID bracelet. If you have a child with diabetes, make sure your child's school has a copy of his or her emergency plan, including the name of the staff member who will assist your child. Where to find more information American Diabetes Association: www.diabetes.org Centers for Disease Control and Prevention: blogs.[employer].com.cy Summary A diabetes emergency preparedness plan is a checklist to make sure you have everything you need in case of an emergency. Your kit should include important information and documents as well as all the supplies you will need to manage your condition for at least 1 week. Store your kit in a portable, Engineer, materials. The best time to start making your emergency kit is now. This information is not intended to replace advice given to you by your health care provider. Make sure you discuss any questions you have with your health care provider. Document Revised: 12/21/2019 Document Reviewed: 12/21/2019 Elsevier Patient Education  [place].      - The correct intake of thyroid hormone (Levothyroxine, Synthroid), is on empty stomach first thing in the morning, with water, separated by at least 30 minutes from breakfast and other medications,  and separated by more than 4 hours from calcium, iron, multivitamins, acid reflux medications (PPIs).  - This medication is a life-long  medication and will be needed to correct thyroid hormone imbalances for the rest of your life.  The dose may change from time to time, based on thyroid blood work.  - It is  extremely important to be consistent taking this medication, near the same time each morning.  -AVOID TAKING PRODUCTS CONTAINING BIOTIN (commonly found in Hair, Skin, Nails vitamins) AS IT INTERFERES WITH THE VALIDITY OF THYROID FUNCTION BLOOD TESTS.

## 2021-07-16 ENCOUNTER — Ambulatory Visit (INDEPENDENT_AMBULATORY_CARE_PROVIDER_SITE_OTHER): Payer: Medicare HMO | Admitting: Nurse Practitioner

## 2021-07-16 ENCOUNTER — Encounter: Payer: Self-pay | Admitting: Nurse Practitioner

## 2021-07-16 ENCOUNTER — Other Ambulatory Visit: Payer: Self-pay

## 2021-07-16 VITALS — BP 142/76 | HR 54 | Ht 68.0 in | Wt 244.0 lb

## 2021-07-16 DIAGNOSIS — E782 Mixed hyperlipidemia: Secondary | ICD-10-CM

## 2021-07-16 DIAGNOSIS — E1165 Type 2 diabetes mellitus with hyperglycemia: Secondary | ICD-10-CM

## 2021-07-16 DIAGNOSIS — I10 Essential (primary) hypertension: Secondary | ICD-10-CM | POA: Diagnosis not present

## 2021-07-16 DIAGNOSIS — E039 Hypothyroidism, unspecified: Secondary | ICD-10-CM

## 2021-07-16 LAB — POCT GLYCOSYLATED HEMOGLOBIN (HGB A1C): HbA1c POC (<> result, manual entry): 10.1 % (ref 4.0–5.6)

## 2021-07-16 LAB — TSH: TSH: 6.02 u[IU]/mL — ABNORMAL HIGH (ref 0.450–4.500)

## 2021-07-16 LAB — COMPREHENSIVE METABOLIC PANEL
ALT: 16 IU/L (ref 0–44)
AST: 17 IU/L (ref 0–40)
Albumin/Globulin Ratio: 1.7 (ref 1.2–2.2)
Albumin: 4.6 g/dL (ref 3.7–4.7)
Alkaline Phosphatase: 93 IU/L (ref 44–121)
BUN/Creatinine Ratio: 11 (ref 10–24)
BUN: 13 mg/dL (ref 8–27)
Bilirubin Total: 0.2 mg/dL (ref 0.0–1.2)
CO2: 22 mmol/L (ref 20–29)
Calcium: 9.4 mg/dL (ref 8.6–10.2)
Chloride: 105 mmol/L (ref 96–106)
Creatinine, Ser: 1.2 mg/dL (ref 0.76–1.27)
Globulin, Total: 2.7 g/dL (ref 1.5–4.5)
Glucose: 109 mg/dL — ABNORMAL HIGH (ref 70–99)
Potassium: 3.8 mmol/L (ref 3.5–5.2)
Sodium: 141 mmol/L (ref 134–144)
Total Protein: 7.3 g/dL (ref 6.0–8.5)
eGFR: 63 mL/min/{1.73_m2} (ref >59)

## 2021-07-16 LAB — T4, FREE: Free T4: 1.04 ng/dL (ref 0.82–1.77)

## 2021-07-16 MED ORDER — LEVOTHYROXINE SODIUM 25 MCG PO TABS: 25.0000 ug | ORAL_TABLET | Freq: Every day | ORAL | 1 refills | Status: DC

## 2021-07-16 MED ORDER — NOVOLIN 70/30 FLEXPEN (70-30) 100 UNIT/ML ~~LOC~~ SUPN: 55.0000 [IU] | PEN_INJECTOR | Freq: Two times a day (BID) | SUBCUTANEOUS | 3 refills | Status: DC

## 2021-07-16 NOTE — Progress Notes (Signed)
07/16/2021, 11:12 AM  Endocrinology follow-up note   Subjective:    Patient ID: Gerald Evans, male    DOB: Sep 21, 1945.  Gerald Evans is being seen in follow-up after he was seen in consultation for management of currently uncontrolled symptomatic diabetes requested by  Carrolyn Meiers, MD.   Past Medical History:  Diagnosis Date   Colon polyps    Diabetes mellitus without complication ([place])    Edema of both lower extremities    Fatty liver    Hypercholesteremia    Hypertension     Past Surgical History:  Procedure Laterality Date   COLONOSCOPY     COLONOSCOPY  04/21/2012   Procedure: COLONOSCOPY;  Surgeon: Rogene Houston, MD;  [facility]: [facility];  Service: Endoscopy;  Laterality: N/A;  1200   COLONOSCOPY N/A 09/09/2017   Procedure: COLONOSCOPY;  Surgeon: Rogene Houston, MD;  Location: [facility];  Service: Endoscopy;  Laterality: N/A;  1200-rescheduled to 3/14/ @12 :00pm per [place]     POLYPECTOMY  09/09/2017   Procedure: POLYPECTOMY;  Surgeon: Rogene Houston, MD;  Location: [facility];  Service: Endoscopy;;  Transverse Colon (CB)    Social History   Socioeconomic History   Marital status: Married    Spouse name: Not on file   Number of children: 2   Years of education: Not on file   Highest education level: Not on file  Occupational History   Occupation: retired  Tobacco Use   Smoking status: Former    Packs/day: 0.50    Years: 20.00    Pack years: 10.00    Types: Cigarettes   Smokeless tobacco: Never  Vaping Use   Vaping Use: Never used  Substance and Sexual Activity   Alcohol use: Yes    Alcohol/week: 4.0 standard drinks    Types: 4 Standard drinks or equivalent per week   Drug use: No   Sexual activity: Not on file  Other Topics Concern   Not on file  Social History Narrative   Not on file   Social Determinants of Health    Financial Resource Strain: Not on file  Food Insecurity: Not on file  Transportation Needs: Not on file  Physical Activity: Not on file  Stress: Not on file  Social Connections: Not on file    Family History  Problem Relation Age of Onset   Hypertension Mother    Hypertension Father    Alcoholism Father    Obesity Father    Diabetes Sister    Hypertension Sister    Healthy Daughter    Healthy Son    Colon cancer Neg Hx     Outpatient Encounter Medications as of 07/16/2021  Medication Sig   levothyroxine (SYNTHROID) 25 MCG tablet Take 1 tablet (25 mcg total) by mouth daily.   amLODipine (NORVASC) 10 MG tablet Take 10 mg by mouth daily.   blood glucose meter kit and supplies Dispense based on patient and insurance preference. Use up to four times daily as directed. (FOR ICD-10 E11.65)   cloNIDine (CATAPRES) 0.2 MG tablet Take 1 tablet by mouth twice daily  glucose blood (ONETOUCH ULTRA) test strip Use 1 strip to check glucose 3 times daily as directed.   hydrochlorothiazide (HYDRODIURIL) 25 MG tablet Take 25 mg by mouth daily.   insulin isophane & regular human KwikPen (NOVOLIN 70/30 KWIKPEN) (70-30) 100 UNIT/ML KwikPen Inject 55 Units into the skin 2 (two) times daily with a meal. Inject 55 units SQ BID with meals if glucose is above 90 and he is eating.   losartan (COZAAR) 100 MG tablet Take 100 mg by mouth daily.   metFORMIN (GLUCOPHAGE) 1000 MG tablet Take 1,000 mg by mouth daily with breakfast.   metoprolol (LOPRESSOR) 100 MG tablet Take 100 mg by mouth 2 (two) times daily.    OneTouch Delica Lancets 34V MISC 1 each by Does not apply route 3 (three) times daily. Use to check blood glucose three times daily   sildenafil (VIAGRA) 100 MG tablet Take 100 mg by mouth daily as needed.   simvastatin (ZOCOR) 20 MG tablet Take 20 mg by mouth daily.   Vitamin D, Ergocalciferol, (DRISDOL) 1.25 MG (50000 UNIT) CAPS capsule Take 1 capsule (50,000 Units total) by mouth every 7 (seven)  days.   [DISCONTINUED] insulin isophane & regular human KwikPen (NOVOLIN 70/30 KWIKPEN) (70-30) 100 UNIT/ML KwikPen Inject 55 units SQ BID with meals if glucose is above 90 and he is eating.   No facility-administered encounter medications on file as of 07/16/2021.    ALLERGIES: Allergies  Allergen Reactions   Lisinopril     cough    VACCINATION STATUS: Immunization History  Administered Date(s) Administered   PFIZER Comirnaty(Gray Top)Covid-19 Tri-Sucrose Vaccine 11/12/2020   PFIZER(Purple Top)SARS-COV-2 Vaccination 08/20/2019, 09/12/2019, 04/25/2020    Diabetes He presents for his follow-up diabetic visit. He has type 2 diabetes mellitus. Onset time: He was diagnosed at approximate age of 18 years. His disease course has been fluctuating. There are no hypoglycemic associated symptoms. Pertinent negatives for hypoglycemia include no confusion, headaches, nervousness/anxiousness, pallor or seizures. Associated symptoms include fatigue. Pertinent negatives for diabetes include no chest pain, no polydipsia, no polyphagia, no polyuria, no weakness and no weight loss. There are no hypoglycemic complications. Symptoms are stable. Diabetic complications include heart disease, impotence and nephropathy. Risk factors for coronary artery disease include dyslipidemia, diabetes mellitus, family history, tobacco exposure, sedentary lifestyle, male sex, obesity and hypertension. Current diabetic treatment includes oral agent (monotherapy) and insulin injections. He is compliant with treatment most of the time. His weight is fluctuating minimally. He is following a generally unhealthy diet. When asked about meal planning, he reported none. He has not had a previous visit with a dietitian. He rarely participates in exercise. His home blood glucose trend is decreasing steadily. His overall blood glucose range is 140-180 mg/dl. (He presents today with his meter, no logs, showing improving glycemic profile overall  over the past several weeks.  His POCT A1c today is 10.1%, increasing from last visit of 9.7%.  He did have trouble obtaining a refill of his insulin between visits and went without for 3 days or so.  He denies any significant hypoglycemia.  Analysis of his meter shows 7-day average of 144, 14-day average of 158, 30-day average of 237.) An ACE inhibitor/angiotensin II receptor blocker is being taken. He does not see a podiatrist.Eye exam is current.  Hyperlipidemia This is a chronic problem. The current episode started more than 1 year ago. The problem is controlled. Recent lipid tests were reviewed and are normal. Exacerbating diseases include chronic renal disease, diabetes and obesity. Factors  aggravating his hyperlipidemia include fatty foods, beta blockers and thiazides. Pertinent negatives include no chest pain, myalgias or shortness of breath. Current antihyperlipidemic treatment includes statins. The current treatment provides mild improvement of lipids. Compliance problems include adherence to diet and adherence to exercise.  Risk factors for coronary artery disease include diabetes mellitus, dyslipidemia, hypertension, male sex, obesity, a sedentary lifestyle and family history.  Hypertension This is a chronic problem. The current episode started more than 1 year ago. The problem has been waxing and waning since onset. The problem is uncontrolled. Pertinent negatives include no chest pain, headaches, neck pain, palpitations or shortness of breath. There are no associated agents to hypertension. Risk factors for coronary artery disease include dyslipidemia, diabetes mellitus, male gender, obesity and smoking/tobacco exposure. Past treatments include angiotensin blockers, diuretics, beta blockers and central alpha agonists. The current treatment provides mild improvement. There are no compliance problems.  Hypertensive end-organ damage includes kidney disease and CAD/MI. Identifiable causes of  hypertension include chronic renal disease and a thyroid problem.  Thyroid Problem Presents for initial visit. Symptoms include fatigue and weight gain. Patient reports no anxiety, cold intolerance, constipation, depressed mood, hair loss, leg swelling, palpitations or weight loss. The symptoms have been stable. Past treatments include nothing. His past medical history is significant for diabetes, hyperlipidemia and obesity. There are no known risk factors.   Review of systems  Constitutional: + steadily increasing body weight,  current Body mass index is 37.1 kg/m. , + fatigue, no subjective hyperthermia, no subjective hypothermia Eyes: no blurry vision, no xerophthalmia ENT: no sore throat, no nodules palpated in throat, no dysphagia/odynophagia, no hoarseness Cardiovascular: no chest pain, no shortness of breath, no palpitations, no leg swelling Respiratory: no cough, no shortness of breath Gastrointestinal: no nausea/vomiting/diarrhea Musculoskeletal: no muscle/joint aches Skin: no rashes, no hyperemia Neurological: no tremors, no numbness, no tingling, no dizziness Psychiatric: no depression, no anxiety  Objective:    BP (!) 142/76    Pulse (!) 54    Ht 5' 8"  (1.727 m)    Wt 244 lb (110.7 kg)    BMI 37.10 kg/m   Wt Readings from Last 3 Encounters:  07/16/21 244 lb (110.7 kg)  06/24/21 237 lb (107.5 kg)  05/27/21 237 lb (107.5 kg)    BP Readings from Last 3 Encounters:  07/16/21 (!) 142/76  06/24/21 140/66  05/27/21 100/74     Physical Exam- Limited  Constitutional:  Body mass index is 37.1 kg/m. , not in acute distress, normal state of mind Eyes:  EOMI, no exophthalmos Neck: Supple Cardiovascular: RRR, no murmurs, rubs, or gallops, no edema Respiratory: Adequate breathing efforts, no crackles, rales, rhonchi, or wheezing Musculoskeletal: no gross deformities, strength intact in all four extremities, no gross restriction of joint movements Skin:  no rashes, no  hyperemia Neurological: no tremor with outstretched hands     CMP ( most recent) CMP     Component Value Date/Time   NA 141 07/15/2021 0912   K 3.8 07/15/2021 0912   CL 105 07/15/2021 0912   CO2 22 07/15/2021 0912   GLUCOSE 109 (H) 07/15/2021 0912   GLUCOSE 234 (H) 08/21/2020 1437   BUN 13 07/15/2021 0912   CREATININE 1.20 07/15/2021 0912   CREATININE 1.00 03/18/2020 1321   CALCIUM 9.4 07/15/2021 0912   PROT 7.3 07/15/2021 0912   ALBUMIN 4.6 07/15/2021 0912   AST 17 07/15/2021 0912   ALT 16 07/15/2021 0912   ALKPHOS 93 07/15/2021 0912   BILITOT 0.2 07/15/2021  [place] 08/21/2020 1437   GFRNONAA 74 03/18/2020 1321   GFRAA 67 07/17/2020 0953   GFRAA 86 03/18/2020 1321    Diabetic Labs (most recent): Lab Results  Component Value Date   HGBA1C 10.1 07/16/2021   HGBA1C 9.7 (A) 04/15/2021   HGBA1C 9.3 (A) 01/13/2021     Assessment & Plan:   1) Uncontrolled type 2 diabetes mellitus with hyperglycemia ([place])  - Gerald Evans has currently uncontrolled symptomatic type 2 DM since  76 years of age.  He presents today with his meter, no logs, showing improving glycemic profile overall over the past several weeks.  His POCT A1c today is 10.1%, increasing from last visit of 9.7%.  He did have trouble obtaining a refill of his insulin between visits and went without for 3 days or so.  He denies any significant hypoglycemia.  Analysis of his meter shows 7-day average of 144, 14-day average of 158, 30-day average of 237.   Recent labs reviewed.  His POCT UM shows microalbuminuria at 150.  Will repeat CMP before next visit.  - I had a long discussion with him about the progressive nature of diabetes and the pathology behind its complications.  -his diabetes is complicated by obesity/sedentary life and he remains at a high risk for more acute and chronic complications which include CAD, CVA, CKD, retinopathy, and neuropathy. These are all discussed in detail with  him.  - Nutritional counseling repeated at each appointment due to patients tendency to fall back in to old habits.  - The patient admits there is a room for improvement in their diet and drink choices. -  Suggestion is made for the patient to avoid simple carbohydrates from their diet including Cakes, Sweet Desserts / Pastries, Ice Cream, Soda (diet and regular), Sweet Tea, Candies, Chips, Cookies, Sweet Pastries, Store Bought Juices, Alcohol in Excess of 1-2 drinks a day, Artificial Sweeteners, Coffee Creamer, and "Sugar-free" Products. This will help patient to have stable blood glucose profile and potentially avoid unintended weight gain.   - I encouraged the patient to switch to unprocessed or minimally processed complex starch and increased protein intake (animal or plant source), fruits, and vegetables.   - Patient is advised to stick to a routine mealtimes to eat 3 meals a day and avoid unnecessary snacks (to snack only to correct hypoglycemia).  - I have approached him with the following individualized plan to manage  his diabetes and patient agrees:   - he will continue to require insulin treatment in order for him to achieve and maintain control of diabetes to target.    -He is benefiting from simplified insulin regimen with premixed insulin.    -Based on his recent improvement in his daily readings, no changes will be made to his medication regimen today.  He is advised to continue his 70/30 at 55 units SQ BID with meals if glucose is above 90 and he is eating.  He can also continue Metformin 1000 mg po daily with breakfast (kidneys are stable).   -He is encouraged to do better monitoring his blood glucose.  He is urged to monitor sugar at least twice per day, before injecting insulin (at breakfast and supper) and call the clinic if readings are less than 70 or greater than 300 for 3 tests in a row.    He will be considered for incretin therapy as needed on subsequent visits.  -  Specific targets for  A1c;  LDL, HDL,  and Triglycerides were discussed with the patient.  2) Blood Pressure /Hypertension:  His blood pressure is controlled to target for his age.  He is advised to continue Clonidine 0.2 mg po twice daily, HCTZ 25 mg po daily, Losartan 100 mg po daily, and Metoprolol 100 mg po twice daily.    3) Lipids/Hyperlipidemia:  His most recent lipid panel from 03/04/21 shows controlled LDL of 77.  He is advised to continue Simvastatin 20 mg po daily at bedtime.  Side effects and precautions discussed with him.    4)  Weight/Diet:  His Body mass index is 37.1 kg/m.  -   clearly complicating his diabetes care.   he is  a candidate for weight loss. I discussed with him the fact that loss of 5 - 10% of his  current body weight will have the most impact on his diabetes management.  Exercise, and detailed carbohydrates information provided  -  detailed on discharge instructions.  5) Hypothyroidism-unspecified He does not have personal or family history of thyroid dysfunction.  His repeat thyroid function tests are still consistent with hypothyroidism.  I discussed and initiated low dose thyroid hormone replacement with Levothyroxine 25 mcg po daily before breakfast.  We did discuss proper administration of this medication to maximize absorption.  Will recheck TFTs prior to next visit, including antibody testing to help classify the etiology.   - The correct intake of thyroid hormone (Levothyroxine, Synthroid), is on empty stomach first thing in the morning, with water, separated by at least 30 minutes from breakfast and other medications,  and separated by more than 4 hours from calcium, iron, multivitamins, acid reflux medications (PPIs).  - This medication is a life-long medication and will be needed to correct thyroid hormone imbalances for the rest of your life.  The dose may change from time to time, based on thyroid blood work.  - It is extremely important to be consistent  taking this medication, near the same time each morning.  -AVOID TAKING PRODUCTS CONTAINING BIOTIN (commonly found in Hair, Skin, Nails vitamins) AS IT INTERFERES WITH THE VALIDITY OF THYROID FUNCTION BLOOD TESTS.  6) Chronic Care/Health Maintenance: -he is on ACEI/ARB and Statin medications and is encouraged to initiate and continue to follow up with Ophthalmology, Dentist,  Podiatrist at least yearly or according to recommendations, and advised to  stay away from smoking. I have recommended yearly flu vaccine and pneumonia vaccine at least every 5 years; moderate intensity exercise for up to 150 minutes weekly; and  sleep for at least 7 hours a day.  - he is advised to maintain close follow up with Carrolyn Meiers, MD for primary care needs, as well as his other providers for optimal and coordinated care.     I spent 30 minutes in the care of the patient today including review of labs from [place], Lipids, Thyroid Function, Hematology (current and previous including abstractions from other facilities); face-to-face time discussing  his blood glucose readings/logs, discussing hypoglycemia and hyperglycemia episodes and symptoms, medications doses, his options of short and long term treatment based on the latest standards of care / guidelines;  discussion about incorporating lifestyle medicine;  and documenting the encounter.    Please refer to Patient Instructions for Blood Glucose Monitoring and Insulin/Medications Dosing Guide"  in media tab for additional information. Please  also refer to " Patient Self Inventory" in the Media  tab for reviewed elements of pertinent patient history.  Gerald Evans participated in the discussions,  expressed understanding, and voiced agreement with the above plans.  All questions were answered to his satisfaction. he is encouraged to contact clinic should he have any questions or concerns prior to his return visit.  Follow up plan: - Return in about 3  months (around 10/14/2021) for Diabetes F/U with A1c in office, Thyroid follow up, No previsit labs, Bring meter and logs.  Rayetta Pigg, V [facility] [address] [place], [place] [address] Phone: [phone] Fax: [phone]  07/16/2021, 11:12 AM

## 2021-07-17 ENCOUNTER — Other Ambulatory Visit (HOSPITAL_BASED_OUTPATIENT_CLINIC_OR_DEPARTMENT_OTHER): Payer: Self-pay

## 2021-07-17 ENCOUNTER — Ambulatory Visit: Payer: Self-pay | Attending: Internal Medicine

## 2021-07-17 DIAGNOSIS — Z23 Encounter for immunization: Secondary | ICD-10-CM

## 2021-07-17 MED ORDER — PFIZER COVID-19 VAC BIVALENT 30 MCG/0.3ML IM SUSP
INTRAMUSCULAR | 0 refills | Status: DC
  Filled 2021-07-17: qty 0.3, 1d supply, fill #0

## 2021-07-17 NOTE — Progress Notes (Signed)
° °  Covid-19 Vaccination Clinic  Name:  Gerald Evans    MRN: [id number] DOB: [birth year]  07/17/2021  [name] was observed post Covid-19 immunization for 15 minutes without incident. He was provided with Vaccine Information Sheet and instruction to access the V-Safe system.   [name] was instructed to call 911 with any severe reactions post vaccine: Difficulty breathing  Swelling of face and throat  A fast heartbeat  A bad rash all over body  Dizziness and weakness   Immunizations Administered     Name Date Dose VIS Date Route   Pfizer Covid-19 Vaccine Bivalent Booster 07/17/2021 12:31 PM 0.3 mL 02/26/2021 Intramuscular   Manufacturer: [employer]   Lot: [id number]   [place]: [phone]

## 2021-07-21 ENCOUNTER — Other Ambulatory Visit: Payer: Self-pay

## 2021-07-21 ENCOUNTER — Ambulatory Visit (INDEPENDENT_AMBULATORY_CARE_PROVIDER_SITE_OTHER): Payer: Medicare HMO | Admitting: Bariatrics

## 2021-07-21 ENCOUNTER — Ambulatory Visit (INDEPENDENT_AMBULATORY_CARE_PROVIDER_SITE_OTHER): Payer: Medicare HMO | Admitting: Family Medicine

## 2021-07-21 ENCOUNTER — Encounter (INDEPENDENT_AMBULATORY_CARE_PROVIDER_SITE_OTHER): Payer: Self-pay | Admitting: Family Medicine

## 2021-07-21 VITALS — BP 155/84 | HR 77 | Temp 98.2°F | Ht 68.0 in | Wt 240.0 lb

## 2021-07-21 DIAGNOSIS — E559 Vitamin D deficiency, unspecified: Secondary | ICD-10-CM | POA: Diagnosis not present

## 2021-07-21 DIAGNOSIS — E038 Other specified hypothyroidism: Secondary | ICD-10-CM

## 2021-07-21 DIAGNOSIS — Z6836 Body mass index (BMI) 36.0-36.9, adult: Secondary | ICD-10-CM | POA: Diagnosis not present

## 2021-07-21 DIAGNOSIS — E1169 Type 2 diabetes mellitus with other specified complication: Secondary | ICD-10-CM

## 2021-07-21 DIAGNOSIS — Z794 Long term (current) use of insulin: Secondary | ICD-10-CM

## 2021-07-21 DIAGNOSIS — E669 Obesity, unspecified: Secondary | ICD-10-CM

## 2021-07-21 MED ORDER — VITAMIN D (ERGOCALCIFEROL) 1.25 MG (50000 UNIT) PO CAPS: 50000.0000 [IU] | ORAL_CAPSULE | ORAL | 0 refills | Status: DC

## 2021-07-21 NOTE — Progress Notes (Signed)
Chief Complaint:   OBESITY Gerald Evans is here to discuss his progress with his obesity treatment plan along with follow-up of his obesity related diagnoses. Gerald Evans is on the Category 2 Plan and states he is following his eating plan approximately 70% of the time. Gerald Evans states he is not currently exercising.  Today's visit was #: 8 Starting weight: 242 lbs Starting date: 03/04/2021 Today's weight: 240 lbs Today's date: 07/21/2021 Total lbs lost to date: 2 Total lbs lost since last in-office visit: +3  Interim History: This is pt's first visit with me. He missed his appt with NP Katy earlier today and was placed on my schedule. Pt denies hunger or cravings. He snacks on "a handful of nuts"- pistachios or walnuts.  Subjective:   1. Type 2 diabetes mellitus with other specified complication, with long-term current use of insulin (HCC) Pt is managed by Endocrinology. He has recent labs and an OV with them. No changes were made to meds and his A1c was 10.1 on 07/16/2021. Pt checks his BS and titrate's 70/30 accordingly per Endo. Medication: 70/30 mix, Metformin QD  2. Other specified hypothyroidism Pt was started on Synthroid 25 mcg recently by Endocrinology, due to increasing TSH to 6.02. He is tolerating it well with no side effects. He has a f/u with endo in 2-3 months.  3. Vitamin D insufficiency Gerald Evans is tolerating medication(s) well without side effects.  Medication compliance is good and patient appears to be taking it as prescribed.  Denies additional concerns regarding this condition.   Assessment/Plan:  No orders of the defined types were placed in this encounter.   Medications Discontinued During This Encounter  Medication Reason   Vitamin D, Ergocalciferol, (DRISDOL) 1.25 MG (50000 UNIT) CAPS capsule Reorder     Meds ordered this encounter  Medications   Vitamin D, Ergocalciferol, (DRISDOL) 1.25 MG (50000 UNIT) CAPS capsule    Sig: Take 1 capsule (50,000  Units total) by mouth every 7 (seven) days.    Dispense:  4 capsule    Refill:  0     1. Type 2 diabetes mellitus with other specified complication, with long-term current use of insulin (HCC) In the future, consider GLP-1, if pt has no contraindications and pt amenable and Endo agrees.     2. Other specified hypothyroidism Continue meds and f/u with endocrinology per their recommendations. We will monitor alongside specialist.  3. Vitamin D insufficiency Low Vitamin D level contributes to fatigue and are associated with obesity, breast, and colon cancer. He agrees to continue to take prescription Vitamin D 50,000 IU every week and will follow-up for routine testing of Vitamin D, at least 2-3 times per year to avoid over-replacement.  Refill- Vitamin D, Ergocalciferol, (DRISDOL) 1.25 MG (50000 UNIT) CAPS capsule; Take 1 capsule (50,000 Units total) by mouth every 7 (seven) days.  Dispense: 4 capsule; Refill: 0  4. Obesity with current BMI of 36.6  Gerald Evans is currently in the action stage of change. As such, his goal is to continue with weight loss efforts. He has agreed to the Category 2 Plan.   Measure foods, snacks, and proteins.  Exercise goals:  As is  Behavioral modification strategies: better snacking choices.  Gerald Evans has agreed to follow-up with our clinic in 2-3 weeks with Dr. Owens Shark or NP Valetta Fuller. He was informed of the importance of frequent follow-up visits to maximize his success with intensive lifestyle modifications for his multiple health conditions.   Objective:   Blood  pressure (!) 155/84, pulse 77, temperature 98.2 F (36.8 C), height 5' 8"  (1.727 m), weight 240 lb (108.9 kg), SpO2 98 %. Body mass index is 36.49 kg/m.  General: Cooperative, alert, well developed, in no acute distress. HEENT: Conjunctivae and lids unremarkable. Cardiovascular: Regular rhythm.  Lungs: Normal work of breathing. Neurologic: No focal deficits.   Lab Results  Component Value Date    CREATININE 1.20 07/15/2021   BUN 13 07/15/2021   NA 141 07/15/2021   K 3.8 07/15/2021   CL 105 07/15/2021   CO2 22 07/15/2021   Lab Results  Component Value Date   ALT 16 07/15/2021   AST 17 07/15/2021   ALKPHOS 93 07/15/2021   BILITOT 0.2 07/15/2021   Lab Results  Component Value Date   HGBA1C 10.1 07/16/2021   HGBA1C 9.7 (A) 04/15/2021   HGBA1C 9.3 (A) 01/13/2021   HGBA1C 9.3 (A) 10/21/2020   HGBA1C 9.6 (H) 07/17/2020   No results found for: INSULIN Lab Results  Component Value Date   TSH 6.020 (H) 07/15/2021   Lab Results  Component Value Date   CHOL 140 03/04/2021   HDL 42 03/04/2021   LDLCALC 77 03/04/2021   TRIG 118 03/04/2021   CHOLHDL 3.1 03/18/2020   Lab Results  Component Value Date   VD25OH 34.1 03/04/2021   Lab Results  Component Value Date   WBC 13.6 (H) 08/21/2020   HGB 11.8 (L) 08/21/2020   HCT 35.7 (L) 08/21/2020   MCV 73.6 (L) 08/21/2020   PLT 198 08/21/2020   Lab Results  Component Value Date   IRON 68 05/25/2016   TIBC 279 05/25/2016   FERRITIN 187 05/25/2016    Obesity Behavioral Intervention:   Approximately 15 minutes were spent on the discussion below.  ASK: We discussed the diagnosis of obesity with Gerald Evans today and Gerald Evans agreed to give [place] permission to discuss obesity behavioral modification therapy today.  ASSESS: Gerald Evans has the diagnosis of obesity and his BMI today is 36.6. Gerald Evans is in the action stage of change.   ADVISE: Gerald Evans was educated on the multiple health risks of obesity as well as the benefit of weight loss to improve his health. He was advised of the need for long term treatment and the importance of lifestyle modifications to improve his current health and to decrease his risk of future health problems.  AGREE: Multiple dietary modification options and treatment options were discussed and Gerald Evans agreed to follow the recommendations documented in the above note.  ARRANGE: Gerald Evans was educated on  the importance of frequent visits to treat obesity as outlined per CMS and USPSTF guidelines and agreed to schedule his next follow up appointment today.  Attestation Statements:   Reviewed by clinician on day of visit: allergies, medications, problem list, medical history, surgical history, family history, social history, and previous encounter notes.  Coral Ceo, CMA, am acting as transcriptionist for [employer], DO.  I have reviewed the above documentation for accuracy and completeness, and I agree with the above. Marjory Sneddon, D.O.  The [place] was signed into law in 2016 which includes the topic of electronic health records.  This provides immediate access to information in MyChart.  This includes consultation notes, operative notes, office notes, lab results and pathology reports.  If you have any questions about what you read please let us know at your next visit so we can discuss your concerns and take corrective action if need be.  We  are right here with you.

## 2021-08-18 ENCOUNTER — Ambulatory Visit (INDEPENDENT_AMBULATORY_CARE_PROVIDER_SITE_OTHER): Payer: Medicare HMO | Admitting: Bariatrics

## 2021-08-18 ENCOUNTER — Other Ambulatory Visit: Payer: Self-pay

## 2021-08-18 ENCOUNTER — Encounter (INDEPENDENT_AMBULATORY_CARE_PROVIDER_SITE_OTHER): Payer: Self-pay | Admitting: Bariatrics

## 2021-08-18 VITALS — BP 148/75 | HR 55 | Temp 98.2°F | Ht 68.0 in | Wt 243.0 lb

## 2021-08-18 DIAGNOSIS — E1169 Type 2 diabetes mellitus with other specified complication: Secondary | ICD-10-CM | POA: Diagnosis not present

## 2021-08-18 DIAGNOSIS — E1159 Type 2 diabetes mellitus with other circulatory complications: Secondary | ICD-10-CM

## 2021-08-18 DIAGNOSIS — Z6836 Body mass index (BMI) 36.0-36.9, adult: Secondary | ICD-10-CM

## 2021-08-18 DIAGNOSIS — I152 Hypertension secondary to endocrine disorders: Secondary | ICD-10-CM

## 2021-08-18 DIAGNOSIS — E669 Obesity, unspecified: Secondary | ICD-10-CM | POA: Diagnosis not present

## 2021-08-18 DIAGNOSIS — Z7984 Long term (current) use of oral hypoglycemic drugs: Secondary | ICD-10-CM

## 2021-08-18 NOTE — Progress Notes (Signed)
Chief Complaint:   OBESITY Gerald Evans is here to discuss his progress with his obesity treatment plan along with follow-up of his obesity related diagnoses. Devion is on the Category 2 Plan and states he is following his eating plan approximately 50% of the time. Emmons states he is walking for 15-20 minutes 3 times per week.  Today's visit was #: 9 Starting weight: 242 lbs Starting date: 03/04/2021 Today's weight: 243 lbs Today's date: 08/18/2021 Total lbs lost to date: 0 Total lbs lost since last in-office visit: 0  Interim History: Gerald Evans has been on vacations in the [place], and he is up 3 lbs since his last visit. He is doing well with his water.  Subjective:   1. Diabetes mellitus type 2 in obese ([place]) Clem's fasting blood sugars ranges in the 140's-200 at times. He is taking Human 70/30 and metformin.  2. Hypertension associated with diabetes ([place]) Keano is taking hydrochlorothiazide, Cozaar, and Lopressor. His blood pressure is reasonably well controlled.  Assessment/Plan:   1. Diabetes mellitus type 2 in obese [facility]) Saahir will continue his medications, and will cut back on carbohydrates. Good blood sugar control is important to decrease the likelihood of diabetic complications such as nephropathy, neuropathy, limb loss, blindness, coronary artery disease, and death. Intensive lifestyle modification including diet, exercise and weight loss are the first line of treatment for diabetes.   2. Hypertension associated with diabetes ([place]) Cheney's will continue his medications, and will continue working on healthy weight loss and exercise to improve blood pressure control. We will watch for signs of hypotension as he continues his lifestyle modifications.  3. Obesity with current BMI of 36.9 Gerald Evans is currently in the action stage of change. As such, his goal is to continue with weight loss efforts. He has agreed to the Category 2 Plan.   Meal planning, intentional  eating, will adhere more closely to his plan.  Exercise goals: Gerald Evans will be more active. He did a 5k last week, and he will be more active this Spring.  Behavioral modification strategies: increasing lean protein intake, decreasing simple carbohydrates, increasing vegetables, increasing water intake, decreasing eating out, no skipping meals, meal planning and cooking strategies, keeping healthy foods in the home, and planning for success.  Gerald Evans has agreed to follow-up with our clinic in 3 weeks. He was informed of the importance of frequent follow-up visits to maximize his success with intensive lifestyle modifications for his multiple health conditions.   Objective:   Blood pressure (!) 148/75, pulse (!) 55, temperature 98.2 F (36.8 C), height 5' 8"  (1.727 m), weight 243 lb (110.2 kg), SpO2 100 %. Body mass index is 36.95 kg/m.  General: Cooperative, alert, well developed, in no acute distress. HEENT: Conjunctivae and lids unremarkable. Cardiovascular: Regular rhythm.  Lungs: Normal work of breathing. Neurologic: No focal deficits.   Lab Results  Component Value Date   CREATININE 1.20 07/15/2021   BUN 13 07/15/2021   NA 141 07/15/2021   K 3.8 07/15/2021   CL 105 07/15/2021   CO2 22 07/15/2021   Lab Results  Component Value Date   ALT 16 07/15/2021   AST 17 07/15/2021   ALKPHOS 93 07/15/2021   BILITOT 0.2 07/15/2021   Lab Results  Component Value Date   HGBA1C 10.1 07/16/2021   HGBA1C 9.7 (A) 04/15/2021   HGBA1C 9.3 (A) 01/13/2021   HGBA1C 9.3 (A) 10/21/2020   HGBA1C 9.6 (H) 07/17/2020   No results found for: INSULIN Lab Results  Component  Value Date   TSH 6.020 (H) 07/15/2021   Lab Results  Component Value Date   CHOL 140 03/04/2021   HDL 42 03/04/2021   LDLCALC 77 03/04/2021   TRIG 118 03/04/2021   CHOLHDL 3.1 03/18/2020   Lab Results  Component Value Date   VD25OH 34.1 03/04/2021   Lab Results  Component Value Date   WBC 13.6 (H) 08/21/2020    HGB 11.8 (L) 08/21/2020   HCT 35.7 (L) 08/21/2020   MCV 73.6 (L) 08/21/2020   PLT 198 08/21/2020   Lab Results  Component Value Date   IRON 68 05/25/2016   TIBC 279 05/25/2016   FERRITIN 187 05/25/2016   Attestation Statements:   Reviewed by clinician on day of visit: allergies, medications, problem list, medical history, surgical history, family history, social history, and previous encounter notes.   Wilhemena Durie, am acting as Location manager for [employer], DO.  I have reviewed the above documentation for accuracy and completeness, and I agree with the above. Jearld Lesch, DO

## 2021-08-28 ENCOUNTER — Other Ambulatory Visit: Payer: Self-pay

## 2021-08-28 MED ORDER — NOVOLIN 70/30 FLEXPEN (70-30) 100 UNIT/ML ~~LOC~~ SUPN: 55.0000 [IU] | PEN_INJECTOR | Freq: Two times a day (BID) | SUBCUTANEOUS | 0 refills | Status: DC

## 2021-09-01 ENCOUNTER — Encounter (INDEPENDENT_AMBULATORY_CARE_PROVIDER_SITE_OTHER): Payer: Self-pay | Admitting: Physician Assistant

## 2021-09-01 ENCOUNTER — Other Ambulatory Visit: Payer: Self-pay

## 2021-09-01 ENCOUNTER — Ambulatory Visit (INDEPENDENT_AMBULATORY_CARE_PROVIDER_SITE_OTHER): Payer: Medicare HMO | Admitting: Physician Assistant

## 2021-09-01 VITALS — BP 116/61 | HR 54 | Temp 97.7°F | Ht 68.0 in | Wt 240.0 lb

## 2021-09-01 DIAGNOSIS — E669 Obesity, unspecified: Secondary | ICD-10-CM | POA: Diagnosis not present

## 2021-09-01 DIAGNOSIS — E559 Vitamin D deficiency, unspecified: Secondary | ICD-10-CM

## 2021-09-01 DIAGNOSIS — E1165 Type 2 diabetes mellitus with hyperglycemia: Secondary | ICD-10-CM

## 2021-09-01 DIAGNOSIS — Z6836 Body mass index (BMI) 36.0-36.9, adult: Secondary | ICD-10-CM | POA: Diagnosis not present

## 2021-09-01 DIAGNOSIS — Z7984 Long term (current) use of oral hypoglycemic drugs: Secondary | ICD-10-CM | POA: Diagnosis not present

## 2021-09-01 MED ORDER — VITAMIN D (ERGOCALCIFEROL) 1.25 MG (50000 UNIT) PO CAPS: 50000.0000 [IU] | ORAL_CAPSULE | ORAL | 0 refills | Status: DC

## 2021-09-03 NOTE — Progress Notes (Signed)
? ? ? ?Chief Complaint:  ? ?OBESITY ?Kwesi is here to discuss his progress with his obesity treatment plan along with follow-up of his obesity related diagnoses. Demeco is on the Category 2 Plan and states he is following his eating plan approximately 60% of the time. Oscar states he is doing 0 minutes 0 times per week. ? ?Today's visit was #: 10 ?Starting weight: 242 lbs ?Starting date: 03/04/2021 ?Today's weight: 240 lbs ?Today's date: 09/01/2021 ?Total lbs lost to date: 2 lbs ?Total lbs lost since last in-office visit: 0 ? ?Interim History: Fenris reports that he has not been walking recently because his wife had foot surgery and he is less likely to walk when she doesn't walk with him. He is snacking a lot late at night after dinner, as he sometimes stays up until 2 am.  ? ?Subjective:  ? ?1. Vitamin D insufficiency ?Jabin is currently on Vitamin D weekly. ? ?2. Uncontrolled type 2 diabetes mellitus with hyperglycemia ([place]) ?Della's last fasting blood sugars was in the ranges of 180-200. This is managed by endocrinology, who he saw January, 2023. He is on Novolin 70/30 and Metformin once daily. His last A1C was 10.1 in January, 2023. ? ?Assessment/Plan:  ? ?1. Vitamin D insufficiency ?Low Vitamin D level contributes to fatigue and are associated with obesity, breast, and colon cancer. We will refill prescription Vitamin D 50,000 IU every week for 1 month with no refills and Dory will follow-up for routine testing of Vitamin D, at least 2-3 times per year to avoid over-replacement. ? ?- Vitamin D, Ergocalciferol, (DRISDOL) 1.25 MG (50000 UNIT) CAPS capsule; Take 1 capsule (50,000 Units total) by mouth every 7 (seven) days.  Dispense: 4 capsule; Refill: 0 ? ?2. Uncontrolled type 2 diabetes mellitus with hyperglycemia ([place]) ?Johathan will follow up with endocrinology. We will continue to monitor blood sugars. He will decrease simple carbohydrates. Good blood sugar control is important to decrease the  likelihood of diabetic complications such as nephropathy, neuropathy, limb loss, blindness, coronary artery disease, and death. Intensive lifestyle modification including diet, exercise and weight loss are the first line of treatment for diabetes.  ? ?3. Obesity with current BMI of 36.5 ?Mckenna is currently in the action stage of change. As such, his goal is to continue with weight loss efforts. He has agreed to the Category 2 Plan.  ? ?Exercise goals: No exercise has been prescribed at this time. ? ?Behavioral modification strategies: meal planning and cooking strategies and ways to avoid night time snacking. ? ?Keegan has agreed to follow-up with our clinic in 3-4 weeks. He was informed of the importance of frequent follow-up visits to maximize his success with intensive lifestyle modifications for his multiple health conditions.  ? ?Objective:  ? ?Blood pressure 116/61, pulse (!) 54, temperature 97.7 ?F (36.5 ?C), height 5' 8"  (1.727 m), weight 240 lb (108.9 kg), SpO2 96 %. ?Body mass index is 36.49 kg/m?. ? ?General: Cooperative, alert, well developed, in no acute distress. ?HEENT: Conjunctivae and lids unremarkable. ?Cardiovascular: Regular rhythm.  ?Lungs: Normal work of breathing. ?Neurologic: No focal deficits.  ? ?Lab Results  ?Component Value Date  ? CREATININE 1.20 07/15/2021  ? BUN 13 07/15/2021  ? NA 141 07/15/2021  ? K 3.8 07/15/2021  ? CL 105 07/15/2021  ? CO2 22 07/15/2021  ? ?Lab Results  ?Component Value Date  ? ALT 16 07/15/2021  ? AST 17 07/15/2021  ? ALKPHOS 93 07/15/2021  ? BILITOT 0.2 07/15/2021  ? ?Lab Results  ?  Component Value Date  ? HGBA1C 10.1 07/16/2021  ? HGBA1C 9.7 (A) 04/15/2021  ? HGBA1C 9.3 (A) 01/13/2021  ? HGBA1C 9.3 (A) 10/21/2020  ? HGBA1C 9.6 (H) 07/17/2020  ? ?No results found for: INSULIN ?Lab Results  ?Component Value Date  ? TSH 6.020 (H) 07/15/2021  ? ?Lab Results  ?Component Value Date  ? CHOL 140 03/04/2021  ? HDL 42 03/04/2021  ? [place] 77 03/04/2021  ? TRIG 118  03/04/2021  ? CHOLHDL 3.1 03/18/2020  ? ?Lab Results  ?Component Value Date  ? VD25OH 34.1 03/04/2021  ? ?Lab Results  ?Component Value Date  ? WBC 13.6 (H) 08/21/2020  ? HGB 11.8 (L) 08/21/2020  ? HCT 35.7 (L) 08/21/2020  ? MCV 73.6 (L) 08/21/2020  ? PLT 198 08/21/2020  ? ?Lab Results  ?Component Value Date  ? IRON 68 05/25/2016  ? TIBC 279 05/25/2016  ? FERRITIN 187 05/25/2016  ? ? ?Obesity Behavioral Intervention:  ? ?Approximately 15 minutes were spent on the discussion below. ? ?ASK: ?We discussed the diagnosis of obesity with Burton Apley today and Marston agreed to give [place] permission to discuss obesity behavioral modification therapy today. ? ?ASSESS: ?Malik has the diagnosis of obesity and his BMI today is 36.5. Savien is in the action stage of change.  ? ?ADVISE: ?Adlai was educated on the multiple health risks of obesity as well as the benefit of weight loss to improve his health. He was advised of the need for long term treatment and the importance of lifestyle modifications to improve his current health and to decrease his risk of future health problems. ? ?AGREE: ?Multiple dietary modification options and treatment options were discussed and Rane agreed to follow the recommendations documented in the above note. ? ?ARRANGE: ?Jamarques was educated on the importance of frequent visits to treat obesity as outlined per CMS and USPSTF guidelines and agreed to schedule his next follow up appointment today. ? ?Attestation Statements:  ? ?Reviewed by clinician on day of visit: allergies, medications, problem list, medical history, surgical history, family history, social history, and previous encounter notes. ? ?I, Tonye Pearson, am acting as Location manager for [employer], PA-C. ? ?I have reviewed the above documentation for accuracy and completeness, and I agree with the above. Abby Potash, PA-C ? ?

## 2021-10-01 ENCOUNTER — Ambulatory Visit (INDEPENDENT_AMBULATORY_CARE_PROVIDER_SITE_OTHER): Payer: Medicare HMO | Admitting: Bariatrics

## 2021-10-07 NOTE — Patient Instructions (Signed)

## 2021-10-08 ENCOUNTER — Ambulatory Visit: Payer: Medicare HMO | Admitting: Nurse Practitioner

## 2021-10-08 ENCOUNTER — Encounter: Payer: Self-pay | Admitting: Nurse Practitioner

## 2021-10-08 VITALS — BP 119/66 | HR 56 | Ht 68.0 in | Wt 250.4 lb

## 2021-10-08 DIAGNOSIS — E782 Mixed hyperlipidemia: Secondary | ICD-10-CM

## 2021-10-08 DIAGNOSIS — E039 Hypothyroidism, unspecified: Secondary | ICD-10-CM | POA: Diagnosis not present

## 2021-10-08 DIAGNOSIS — E1165 Type 2 diabetes mellitus with hyperglycemia: Secondary | ICD-10-CM | POA: Diagnosis not present

## 2021-10-08 DIAGNOSIS — I10 Essential (primary) hypertension: Secondary | ICD-10-CM | POA: Diagnosis not present

## 2021-10-08 LAB — POCT GLYCOSYLATED HEMOGLOBIN (HGB A1C): HbA1c, POC (controlled diabetic range): 9.7 % — AB (ref 0.0–7.0)

## 2021-10-08 LAB — THYROGLOBULIN ANTIBODY: Thyroglobulin Antibody: <1 IU/mL (ref 0.0–0.9)

## 2021-10-08 LAB — T3, FREE: T3, Free: 2.6 pg/mL (ref 2.0–4.4)

## 2021-10-08 LAB — T4, FREE: Free T4: 0.86 ng/dL (ref 0.82–1.77)

## 2021-10-08 LAB — THYROID PEROXIDASE ANTIBODY: Thyroperoxidase Ab SerPl-aCnc: <9 IU/mL (ref 0–34)

## 2021-10-08 LAB — TSH: TSH: 8.08 u[IU]/mL — ABNORMAL HIGH (ref 0.450–4.500)

## 2021-10-08 NOTE — Progress Notes (Signed)
? ?                                                      ?     10/08/2021, 10:53 AM ? ?Endocrinology follow-up note ? ? ?Subjective:  ? ? Patient ID: Gerald Evans, male    DOB: 09/02/1945.  ?Gerald Evans is being seen in follow-up after he was seen in consultation for management of currently uncontrolled symptomatic diabetes requested by  Carrolyn Meiers, MD. ? ? ?Past Medical History:  ?Diagnosis Date  ? Colon polyps   ? Diabetes mellitus without complication ([place])   ? Edema of both lower extremities   ? Fatty liver   ? Hypercholesteremia   ? Hypertension   ? ? ?Past Surgical History:  ?Procedure Laterality Date  ? COLONOSCOPY    ? COLONOSCOPY  04/21/2012  ? Procedure: COLONOSCOPY;  Surgeon: Rogene Houston, MD;  [facility];  Service: Endoscopy;  Laterality: N/A;  1200  ? COLONOSCOPY N/A 09/09/2017  ? Procedure: COLONOSCOPY;  Surgeon: Rogene Houston, MD;  [facility];  Service: Endoscopy;  Laterality: N/A;  1200-rescheduled to 3/14/ _0 :00pm per Lelon Frohlich  ? HERNIA REPAIR    ? POLYPECTOMY  09/09/2017  ? Procedure: POLYPECTOMY;  Surgeon: Rogene Houston, MD;  [facility];  Service: Endoscopy;;  Transverse Colon ([facility])  ? ? ?Social History  ? ?Socioeconomic History  ? Marital status: Married  ?  Spouse name: Not on file  ? Number of children: 2  ? Years of education: Not on file  ? Highest education level: Not on file  ?Occupational History  ? Occupation: retired  ?Tobacco Use  ? Smoking status: Former  ?  Packs/day: 0.50  ?  Years: 20.00  ?  Pack years: 10.00  ?  Types: Cigarettes  ? Smokeless tobacco: Never  ?Vaping Use  ? Vaping Use: Never used  ?Substance and Sexual Activity  ? Alcohol use: Yes  ?  Alcohol/week: 4.0 standard drinks  ?  Types: 4 Standard drinks or equivalent per week  ? Drug use: No  ? Sexual activity: Not on file  ?Other Topics Concern  ? Not on file  ?Social History Narrative  ? Not on file  ? ?Social Determinants of Health   ? ?Financial Resource Strain: Not on file  ?Food Insecurity: Not on file  ?Transportation Needs: Not on file  ?Physical Activity: Not on file  ?Stress: Not on file  ?Social Connections: Not on file  ? ? ?Family History  ?Problem Relation Age of Onset  ? Hypertension Mother   ? Hypertension Father   ? Alcoholism Father   ? Obesity Father   ? Diabetes Sister   ? Hypertension Sister   ? Healthy Daughter   ? Healthy Son   ? Colon cancer Neg Hx   ? ? ?Outpatient Encounter Medications as of 10/08/2021  ?Medication Sig  ? amLODipine (NORVASC) 10 MG tablet Take 10 mg by mouth daily.  ? blood glucose meter kit and supplies Dispense based on patient and insurance preference. Use up to four times daily as directed. (FOR ICD-10 E11.65)  ? cloNIDine (CATAPRES) 0.2 MG tablet Take 1 tablet by mouth twice daily  ? COVID-19 mRNA bivalent vaccine, Pfizer, (PFIZER COVID-19 VAC BIVALENT) injection Inject into the muscle.  ?  glucose blood (ONETOUCH ULTRA) test strip Use 1 strip to check glucose 3 times daily as directed.  ? hydrochlorothiazide (HYDRODIURIL) 25 MG tablet Take 25 mg by mouth daily.  ? insulin isophane & regular human KwikPen (NOVOLIN 70/30 KWIKPEN) (70-30) 100 UNIT/ML KwikPen Inject 55 Units into the skin 2 (two) times daily with a meal. Inject 55 units SQ BID with meals if glucose is above 90 and he is eating.  ? levothyroxine (SYNTHROID) 25 MCG tablet Take 1 tablet (25 mcg total) by mouth daily.  ? losartan (COZAAR) 100 MG tablet Take 100 mg by mouth daily.  ? metFORMIN (GLUCOPHAGE) 1000 MG tablet Take 1,000 mg by mouth daily with breakfast.  ? metoprolol (LOPRESSOR) 100 MG tablet Take 100 mg by mouth 2 (two) times daily.   ? OneTouch Delica Lancets 22Q MISC 1 each by Does not apply route 3 (three) times daily. Use to check blood glucose three times daily  ? sildenafil (VIAGRA) 100 MG tablet Take 100 mg by mouth daily as needed.  ? simvastatin (ZOCOR) 20 MG tablet Take 20 mg by mouth daily.  ? Vitamin D, Ergocalciferol,  (DRISDOL) 1.25 MG (50000 UNIT) CAPS capsule Take 1 capsule (50,000 Units total) by mouth every 7 (seven) days.  ? ?No facility-administered encounter medications on file as of 10/08/2021.  ? ? ?ALLERGIES: ?Allergies  ?Allergen Reactions  ? Lisinopril   ?  cough  ? ? ?VACCINATION STATUS: ?Immunization History  ?Administered Date(s) Administered  ? PFIZER Comirnaty(Gray Top)Covid-19 Tri-Sucrose Vaccine 11/12/2020  ? PFIZER(Purple Top)SARS-COV-2 Vaccination 08/20/2019, 09/12/2019, 04/25/2020  ? Pension scheme manager 52yr & up 07/17/2021  ? ? ?Diabetes ?He presents for his follow-up diabetic visit. He has type 2 diabetes mellitus. Onset time: He was diagnosed at approximate age of 89years. His disease course has been improving. There are no hypoglycemic associated symptoms. Pertinent negatives for hypoglycemia include no confusion, headaches, nervousness/anxiousness, pallor or seizures. Associated symptoms include fatigue. Pertinent negatives for diabetes include no chest pain, no polydipsia, no polyphagia, no polyuria, no weakness and no weight loss. There are no hypoglycemic complications. Symptoms are stable. Diabetic complications include heart disease, impotence and nephropathy. Risk factors for coronary artery disease include dyslipidemia, diabetes mellitus, family history, tobacco exposure, sedentary lifestyle, male sex, obesity and hypertension. Current diabetic treatment includes oral agent (monotherapy) and insulin injections. He is compliant with treatment most of the time. His weight is fluctuating minimally. He is following a generally unhealthy diet. When asked about meal planning, he reported none. He has not had a previous visit with a dietitian. He participates in exercise three times a week. His home blood glucose trend is decreasing steadily. His overall blood glucose range is 140-180 mg/dl. (He presents today with his meter, no logs, showing improving fasting readings.  His POCT  A1c today is 9.7%, improving from last visit of 10.1%.  He admits he still eats sweets, has a hard time resisting temptation, but has started going to the gym 3 times per week and is seeing improvement in his glucose readings as a result.  He denies any hypoglycemia.  Analysis of his meter shows 7-day average of 172, 14-day average of 170, 30-day average of 167.) An ACE inhibitor/angiotensin II receptor blocker is being taken. He does not see a podiatrist.Eye exam is current.  ?Hyperlipidemia ?This is a chronic problem. The current episode started more than 1 year ago. The problem is controlled. Recent lipid tests were reviewed and are normal. Exacerbating diseases include chronic renal  disease, diabetes and obesity. Factors aggravating his hyperlipidemia include fatty foods, beta blockers and thiazides. Pertinent negatives include no chest pain, myalgias or shortness of breath. Current antihyperlipidemic treatment includes statins. The current treatment provides mild improvement of lipids. Compliance problems include adherence to diet and adherence to exercise.  Risk factors for coronary artery disease include diabetes mellitus, dyslipidemia, hypertension, male sex, obesity, a sedentary lifestyle and family history.  ?Hypertension ?This is a chronic problem. The current episode started more than 1 year ago. The problem has been waxing and waning since onset. The problem is uncontrolled. Pertinent negatives include no chest pain, headaches, neck pain, palpitations or shortness of breath. There are no associated agents to hypertension. Risk factors for coronary artery disease include dyslipidemia, diabetes mellitus, male gender, obesity and smoking/tobacco exposure. Past treatments include angiotensin blockers, diuretics, beta blockers and central alpha agonists. The current treatment provides mild improvement. There are no compliance problems.  Hypertensive end-organ damage includes kidney disease and CAD/MI.  Identifiable causes of hypertension include chronic renal disease and a thyroid problem.  ?Thyroid Problem ?Presents for initial visit. Symptoms include fatigue and weight gain. Patient reports no anxiety, cold intoler

## 2021-10-16 ENCOUNTER — Other Ambulatory Visit: Payer: Medicare HMO

## 2021-10-16 DIAGNOSIS — R972 Elevated prostate specific antigen [PSA]: Secondary | ICD-10-CM

## 2021-10-17 LAB — PSA, TOTAL AND FREE
PSA, Free Pct: 18.4 %
PSA, Free: 0.92 ng/mL
Prostate Specific Ag, Serum: 5 ng/mL — ABNORMAL HIGH (ref 0.0–4.0)

## 2021-10-23 ENCOUNTER — Ambulatory Visit (INDEPENDENT_AMBULATORY_CARE_PROVIDER_SITE_OTHER): Payer: Medicare HMO | Admitting: Urology

## 2021-10-23 ENCOUNTER — Encounter: Payer: Self-pay | Admitting: Urology

## 2021-10-23 VITALS — BP 136/63 | HR 59

## 2021-10-23 DIAGNOSIS — N401 Enlarged prostate with lower urinary tract symptoms: Secondary | ICD-10-CM | POA: Diagnosis not present

## 2021-10-23 DIAGNOSIS — R3915 Urgency of urination: Secondary | ICD-10-CM

## 2021-10-23 DIAGNOSIS — R972 Elevated prostate specific antigen [PSA]: Secondary | ICD-10-CM

## 2021-10-23 DIAGNOSIS — M720 Palmar fascial fibromatosis [Dupuytren]: Secondary | ICD-10-CM

## 2021-10-23 DIAGNOSIS — Z87440 Personal history of urinary (tract) infections: Secondary | ICD-10-CM | POA: Diagnosis not present

## 2021-10-23 DIAGNOSIS — N5201 Erectile dysfunction due to arterial insufficiency: Secondary | ICD-10-CM

## 2021-10-23 DIAGNOSIS — N138 Other obstructive and reflux uropathy: Secondary | ICD-10-CM

## 2021-10-23 NOTE — Progress Notes (Signed)
?Subjective: ?1. Elevated PSA   ?2. BPH with urinary obstruction   ?3. Urgency of urination   ?4. 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 Dupuytren's contracture of right hand   ?  ?10/23/21: Gerald Evans returns today in f/u for his history of an elevated PSA associated with a UTI.   The PSA was back down to 2.6 after treatment but is back up to 5.0 with an 18.4% f/t ratio prior to this visit.  He has had no recurrent UTI's.  His IPSS today is 5 with nocturia x 2.  He has ED and continues to use sildenafil but he gets heartburn with it.  His UA is unremarkable.  He has an issue with the 3rd finger on the right hand to straightening completely.   ? ?10/24/20: Gerald Evans returns today with a repeat PSA which is down to 2.6 with a 26% f/t ratio after completing his antibiotics.   His UA is clear.    ? ?GU Hx: Gerald Evans is a 76 yo male who is sent by Dr. Legrand Rams for the evaluation of an elevated PSA of 36 on 08/23/20.  He thinks his PSA was about 3.1 in 2021.  He had been seen in the ER on 2/23 and was found to have a e. Coli UTI.   He was treated with a 7 day course of keflex.  A CT showed changes consistent with cystitis.  He had previously seen Dr. Diona Fanti for ED. His UA is clear today.  He has a little residual initial dysuria.  He has moderate LUTS with urgency and occasional UUI. He doesn't always feel empty.  He has a good stream.  He has nocturia x 2.   He has had no prior UTI's or GU surgery.   He is a diabetic and has HTN.  ?ROS: ? ?Review of Systems  ?All other systems reviewed and are negative. ? ?Allergies  ?Allergen Reactions  ? Lisinopril   ?  cough  ? ? ?Past Medical History:  ?Diagnosis Date  ? Colon polyps   ? Diabetes mellitus without complication ([place])   ? Edema of both lower extremities   ? Fatty liver   ? Hypercholesteremia   ? Hypertension   ? ? ?Past Surgical History:  ?Procedure Laterality Date  ? COLONOSCOPY    ? COLONOSCOPY  04/21/2012  ?  Procedure: COLONOSCOPY;  Surgeon: Rogene Houston, MD;  [facility];  Service: Endoscopy;  Laterality: N/A;  1200  ? COLONOSCOPY N/A 09/09/2017  ? Procedure: COLONOSCOPY;  Surgeon: Rogene Houston, MD;  [facility];  Service: Endoscopy;  Laterality: N/A;  1200-rescheduled to 3/14/ _0 :00pm per Lelon Frohlich  ? HERNIA REPAIR    ? POLYPECTOMY  09/09/2017  ? Procedure: POLYPECTOMY;  Surgeon: Rogene Houston, MD;  [facility];  Service: Endoscopy;;  Transverse Colon ([facility])  ? ? ?Social History  ? ?Socioeconomic History  ? Marital status: Married  ?  Spouse name: Not on file  ? Number of children: 2  ? Years of education: Not on file  ? Highest education level: Not on file  ?Occupational History  ? Occupation: retired  ?Tobacco Use  ? Smoking status: Former  ?  Packs/day: 0.50  ?  Years: 20.00  ?  Pack years: 10.00  ?  Types: Cigarettes  ? Smokeless tobacco: Never  ?Vaping Use  ? Vaping Use: Never used  ?Substance and  Sexual Activity  ? Alcohol use: Yes  ?  Alcohol/week: 4.0 standard drinks  ?  Types: 4 Standard drinks or equivalent per week  ? Drug use: No  ? Sexual activity: Not on file  ?Other Topics Concern  ? Not on file  ?Social History Narrative  ? Not on file  ? ?Social Determinants of Health  ? ?Financial Resource Strain: Not on file  ?Food Insecurity: Not on file  ?Transportation Needs: Not on file  ?Physical Activity: Not on file  ?Stress: Not on file  ?Social Connections: Not on file  ?Intimate Partner Violence: Not on file  ? ? ?Family History  ?Problem Relation Age of Onset  ? Hypertension Mother   ? Hypertension Father   ? Alcoholism Father   ? Obesity Father   ? Diabetes Sister   ? Hypertension Sister   ? Healthy Daughter   ? Healthy Son   ? Colon cancer Neg Hx   ? ? ?Anti-infectives: ?Anti-infectives (From admission, onward)  ? ? None  ? ?  ? ? ?Current Outpatient Medications  ?Medication Sig Dispense Refill  ? amLODipine (NORVASC) 10 MG tablet Take 10 mg by mouth daily.    ?  blood glucose meter kit and supplies Dispense based on patient and insurance preference. Use up to four times daily as directed. (FOR ICD-10 E11.65) 1 each 0  ? cloNIDine (CATAPRES) 0.2 MG tablet Take 1 tablet by mouth twice daily 180 tablet 0  ? COVID-19 mRNA bivalent vaccine, Pfizer, (PFIZER COVID-19 VAC BIVALENT) injection Inject into the muscle. 0.3 mL 0  ? glucose blood (ONETOUCH ULTRA) test strip Use 1 strip to check glucose 3 times daily as directed. 300 each 1  ? hydrochlorothiazide (HYDRODIURIL) 25 MG tablet Take 25 mg by mouth daily.    ? insulin isophane & regular human KwikPen (NOVOLIN 70/30 KWIKPEN) (70-30) 100 UNIT/ML KwikPen Inject 55 Units into the skin 2 (two) times daily with a meal. Inject 55 units SQ BID with meals if glucose is above 90 and he is eating. 90 mL 0  ? levothyroxine (SYNTHROID) 25 MCG tablet Take 1 tablet (25 mcg total) by mouth daily. 90 tablet 1  ? losartan (COZAAR) 100 MG tablet Take 100 mg by mouth daily.    ? metFORMIN (GLUCOPHAGE) 1000 MG tablet Take 1,000 mg by mouth daily with breakfast.    ? metoprolol (LOPRESSOR) 100 MG tablet Take 100 mg by mouth 2 (two) times daily.     ? OneTouch Delica Lancets 00T MISC 1 each by Does not apply route 3 (three) times daily. Use to check blood glucose three times daily 100 each 3  ? sildenafil (VIAGRA) 100 MG tablet Take 100 mg by mouth daily as needed.    ? simvastatin (ZOCOR) 20 MG tablet Take 20 mg by mouth daily.    ? Vitamin D, Ergocalciferol, (DRISDOL) 1.25 MG (50000 UNIT) CAPS capsule Take 1 capsule (50,000 Units total) by mouth every 7 (seven) days. 4 capsule 0  ? ?No current facility-administered medications for this visit.  ? ? ? ?Objective: ?Vital signs in last 24 hours: ?BP 136/63   Pulse (!) 59  ? ?Intake/Output from previous day: ?No intake/output data recorded. ?Intake/Output this shift: ?_0 @ ? ? ?Physical Exam ? ?Lab Results:  ?Recent Results (from the past 2160 hour(s))  ?TSH     Status: Abnormal  ? Collection  Time: 10/07/21  9:33 AM  ?Result Value Ref Range  ? TSH 8.080 (H) 0.450 - 4.500 uIU/mL  ?  T4, free     Status: None  ? Collection Time: 10/07/21  9:33 AM  ?Result Value Ref Range  ? Free T4 0.86 0.82 - 1.77 ng/dL  ?Thyroid peroxidase antibody     Status: None  ? Collection Time: 10/07/21  9:33 AM  ?Result Value Ref Range  ? Thyroperoxidase Ab SerPl-aCnc <9 0 - 34 IU/mL  ?T3, free     Status: None  ? Collection Time: 10/07/21  9:33 AM  ?Result Value Ref Range  ? T3, Free 2.6 2.0 - 4.4 pg/mL  ?Thyroglobulin antibody     Status: None  ? Collection Time: 10/07/21  9:33 AM  ?Result Value Ref Range  ? Thyroglobulin Antibody <1.0 0.0 - 0.9 IU/mL  ?  Comment: Thyroglobulin Antibody measured by [employer]man Coulter Methodology  ?POCT glycosylated hemoglobin (Hb A1C)     Status: Abnormal  ? Collection Time: 10/08/21 10:28 AM  ?Result Value Ref Range  ? Hemoglobin A1C    ? HbA1c POC (<> result, manual entry)    ? HbA1c, POC (prediabetic range)    ? HbA1c, POC (controlled diabetic range) 9.7 (A) 0.0 - 7.0 %  ?PSA, total and free     Status: Abnormal  ? Collection Time: 10/16/21 11:17 AM  ?Result Value Ref Range  ? Prostate Specific Ag, Serum 5.0 (H) 0.0 - 4.0 ng/mL  ?  Comment: Roche ECLIA methodology. ?According to the American Urological Association, Serum PSA should ?decrease and remain at undetectable levels after radical ?prostatectomy. The AUA defines biochemical recurrence as an initial ?PSA value 0.2 ng/mL or greater followed by a subsequent confirmatory ?PSA value 0.2 ng/mL or greater. ?Values obtained with different assay methods or kits cannot be used ?interchangeably. Results cannot be interpreted as absolute evidence ?of the presence or absence of malignant disease. ?  ? PSA, Free 0.92 N/A ng/mL  ?  Comment: Roche ECLIA methodology.  ? PSA, Free Pct 18.4 %  ?  Comment: The table below lists the probability of prostate cancer for ?men with non-suspicious DRE results and total PSA between ?4 and 10 ng/mL, by patient age  (Catalona et al, JAMA 1998, ?279:1542). ?                  % Free PSA       50-64 yr        65-75 yr ?                  0.00-10.00%        56%             55% ?                 10.01-15.00%        24%

## 2021-10-24 LAB — URINALYSIS, ROUTINE W REFLEX MICROSCOPIC
Bilirubin, UA: NEGATIVE
Glucose, UA: NEGATIVE
Ketones, UA: NEGATIVE
Leukocytes,UA: NEGATIVE
Nitrite, UA: NEGATIVE
Specific Gravity, UA: 1.02 (ref 1.005–1.030)
Urobilinogen, Ur: 0.2 mg/dL (ref 0.2–1.0)
pH, UA: 6 (ref 5.0–7.5)

## 2021-10-24 LAB — MICROSCOPIC EXAMINATION
Bacteria, UA: NONE SEEN
Renal Epithel, UA: NONE SEEN /hpf
WBC, UA: NONE SEEN /hpf (ref 0–5)

## 2021-10-29 ENCOUNTER — Ambulatory Visit (INDEPENDENT_AMBULATORY_CARE_PROVIDER_SITE_OTHER): Payer: Medicare HMO | Admitting: Bariatrics

## 2021-10-29 ENCOUNTER — Encounter (INDEPENDENT_AMBULATORY_CARE_PROVIDER_SITE_OTHER): Payer: Self-pay | Admitting: Bariatrics

## 2021-10-29 VITALS — BP 127/67 | HR 56 | Ht 66.0 in | Wt 248.0 lb

## 2021-10-29 DIAGNOSIS — Z6837 Body mass index (BMI) 37.0-37.9, adult: Secondary | ICD-10-CM

## 2021-10-29 DIAGNOSIS — E1169 Type 2 diabetes mellitus with other specified complication: Secondary | ICD-10-CM | POA: Diagnosis not present

## 2021-10-29 DIAGNOSIS — E669 Obesity, unspecified: Secondary | ICD-10-CM

## 2021-10-29 DIAGNOSIS — Z7984 Long term (current) use of oral hypoglycemic drugs: Secondary | ICD-10-CM

## 2021-10-29 DIAGNOSIS — Z6836 Body mass index (BMI) 36.0-36.9, adult: Secondary | ICD-10-CM

## 2021-10-29 DIAGNOSIS — I10 Essential (primary) hypertension: Secondary | ICD-10-CM | POA: Diagnosis not present

## 2021-10-29 DIAGNOSIS — E559 Vitamin D deficiency, unspecified: Secondary | ICD-10-CM | POA: Diagnosis not present

## 2021-10-29 DIAGNOSIS — E119 Type 2 diabetes mellitus without complications: Secondary | ICD-10-CM

## 2021-10-29 MED ORDER — VITAMIN D (ERGOCALCIFEROL) 1.25 MG (50000 UNIT) PO CAPS: 50000.0000 [IU] | ORAL_CAPSULE | ORAL | 0 refills | Status: DC

## 2021-10-30 ENCOUNTER — Ambulatory Visit: Payer: Self-pay

## 2021-10-30 ENCOUNTER — Encounter: Payer: Self-pay | Admitting: Orthopedic Surgery

## 2021-10-30 ENCOUNTER — Ambulatory Visit: Payer: Medicare HMO | Admitting: Orthopedic Surgery

## 2021-10-30 DIAGNOSIS — M79641 Pain in right hand: Secondary | ICD-10-CM | POA: Diagnosis not present

## 2021-10-30 DIAGNOSIS — M65331 Trigger finger, right middle finger: Secondary | ICD-10-CM | POA: Diagnosis not present

## 2021-10-30 MED ORDER — BETAMETHASONE SOD PHOS & ACET 6 (3-3) MG/ML IJ SUSP
6.0000 mg | INTRAMUSCULAR | Status: AC | PRN
  Administered 2021-10-30: 6 mg via INTRA_ARTICULAR

## 2021-10-30 MED ORDER — LIDOCAINE HCL 1 % IJ SOLN
1.0000 mL | INTRAMUSCULAR | Status: AC | PRN
  Administered 2021-10-30: 1 mL

## 2021-10-30 NOTE — Progress Notes (Signed)
? ?Office Visit Note ?  ?Patient: Gerald Evans           ?Date of Birth: 16-Jan-1946           ?MRN: [id number] ?Visit Date: 10/30/2021 ?             ?Requested by: Irine Seal, MD ?[address] ?[place],  [place] [id number] ?PCP: Carrolyn Meiers, MD ? ? ?Assessment & Plan: ?Visit Diagnoses:  ?1. Pain in right hand   ?2. Trigger finger, right middle finger   ? ? ?Plan: Patient's history and exam findings seem most consistent with a trigger finger of the right middle finger.  We reviewed the nature of trigger finger as well as its diagnosis, prognosis, and both conservative and surgical treatment options.  After our discussion, the patient would like to proceed with a corticosteroid injection into the right middle finger A1 pulley.  We reviewed the risks of injections including bleeding, infection, and changes in his blood sugar given his diabetes.  He will keep a close eye on his blood sugar over the next several weeks.  I can see him back in the office in 10 weeks or so to see if he's still having symptoms. ? ?Follow-Up Instructions: No follow-ups on file.  ? ?Orders:  ?Orders Placed This Encounter  ?Procedures  ? XR Hand Complete Right  ? ?No orders of the defined types were placed in this encounter. ? ? ? ? Procedures: ?Hand/UE Inj: R long A1 for trigger finger on 10/30/2021 3:21 PM ?Indications: tendon swelling and therapeutic ?Details: 25 G needle, volar approach ?Medications: 1 mL lidocaine 1 %; 6 mg betamethasone acetate-betamethasone sodium phosphate 6 (3-3) MG/ML ?Procedure, treatment alternatives, risks and benefits explained, specific risks discussed. Consent was given by the patient. Immediately prior to procedure a time out was called to verify the correct patient, procedure, equipment, support staff and site/side marked as required. Patient was prepped and draped in the usual sterile fashion.  ? ? ? ? ?Clinical Data: ?No additional findings. ? ? ?Subjective: ?Chief Complaint  ?Patient presents with  ?  Right Hand - New Patient (Initial Visit)  ? ? ?This is a 76 year old right-hand-dominant male who presents with locking of his right middle finger.  This has been going on for around 2 months or so.  He notes that this problem is worse first thing in the morning and he will wake up with his right middle finger stuck in a flexed position.  He is able to unlock the finger without using his contralateral hand.  He has some tenderness over the A1 pulley.  He denies any previous triggering of other digits. ? ? ?Review of Systems ? ? ?Objective: ?Vital Signs: There were no vitals taken for this visit. ? ?Physical Exam ?Constitutional:   ?   Appearance: Normal appearance.  ?Cardiovascular:  ?   Rate and Rhythm: Normal rate.  ?   Pulses: Normal pulses.  ?Pulmonary:  ?   Effort: Pulmonary effort is normal.  ?Skin: ?   General: Skin is warm and dry.  ?   Capillary Refill: Capillary refill takes less than 2 seconds.  ?Neurological:  ?   Mental Status: He is alert.  ? ? ?Right Hand Exam  ? ?Tenderness  ?Right hand tenderness location: TTP at middle finger A1 pulley. ? ?Other  ?Erythema: absent ?Sensation: normal ?Pulse: present ? ?Comments:  No palpable cords or nodules.  Palpable triggering of middle finger at A1 pulley with passive and  active flexion.  ? ? ? ? ?Specialty Comments:  ?No specialty comments available. ? ?Imaging: ?No results found. ? ? ?PMFS History: ?Patient Active Problem List  ? Diagnosis Date Noted  ? Trigger finger, right middle finger 10/30/2021  ? Vitamin D insufficiency 06/24/2021  ? Eating disorder 06/24/2021  ? Class 2 obesity due to excess calories with body mass index (BMI) of 36.0 to 36.9 in adult 05/07/2021  ? Elevated TSH 03/05/2021  ? Uncontrolled type 2 diabetes mellitus with hyperglycemia ([place]) 12/19/2019  ? Fatty liver   ? History of colonic polyps 05/13/2017  ? DIAB W/HYPEROSMOLARITY TYPE II/UNS TYPE UNCNTRL 04/21/2010  ? PURE HYPERCHOLESTEROLEMIA 04/21/2010  ? Mixed hyperlipidemia 04/21/2010   ? HYPOPOTASSEMIA 04/21/2010  ? Essential hypertension, benign 04/21/2010  ? CORONARY ATHEROSCLEROSIS NATIVE CORONARY ARTERY 04/21/2010  ? PERS HX NONCOMPLIANCE W/MED TX PRS HAZARDS HLTH 04/21/2010  ? TOBACCO ABUSE, HX OF 04/21/2010  ? ?Past Medical History:  ?Diagnosis Date  ? Colon polyps   ? Diabetes mellitus without complication ([place])   ? Edema of both lower extremities   ? Fatty liver   ? Hypercholesteremia   ? Hypertension   ?  ?Family History  ?Problem Relation Age of Onset  ? Hypertension Mother   ? Hypertension Father   ? Alcoholism Father   ? Obesity Father   ? Diabetes Sister   ? Hypertension Sister   ? Healthy Daughter   ? Healthy Son   ? Colon cancer Neg Hx   ?  ?Past Surgical History:  ?Procedure Laterality Date  ? COLONOSCOPY    ? COLONOSCOPY  04/21/2012  ? Procedure: COLONOSCOPY;  Surgeon: Rogene Houston, MD;  [facility];  Service: Endoscopy;  Laterality: N/A;  1200  ? COLONOSCOPY N/A 09/09/2017  ? Procedure: COLONOSCOPY;  Surgeon: Rogene Houston, MD;  [facility];  Service: Endoscopy;  Laterality: N/A;  1200-rescheduled to 3/14/ @12 :00pm per Lelon Frohlich  ? HERNIA REPAIR    ? POLYPECTOMY  09/09/2017  ? Procedure: POLYPECTOMY;  Surgeon: Rogene Houston, MD;  [facility];  Service: Endoscopy;;  Transverse Colon ([facility])  ? ?Social History  ? ?Occupational History  ? Occupation: retired  ?Tobacco Use  ? Smoking status: Former  ?  Packs/day: 0.50  ?  Years: 20.00  ?  Pack years: 10.00  ?  Types: Cigarettes  ? Smokeless tobacco: Never  ?Vaping Use  ? Vaping Use: Never used  ?Substance and Sexual Activity  ? Alcohol use: Yes  ?  Alcohol/week: 4.0 standard drinks  ?  Types: 4 Standard drinks or equivalent per week  ? Drug use: No  ? Sexual activity: Not on file  ? ? ? ? ? ? ?

## 2021-11-05 NOTE — Progress Notes (Signed)
Chief Complaint:   OBESITY Gerald Evans is here to discuss his progress with his obesity treatment plan along with follow-up of his obesity related diagnoses. Gerald Evans is on the Category 2 Plan and states he is following his eating plan approximately 65% of the time. Gerald Evans states he is walking 30 minutes 3 times per week.  Today's visit was #: 11 Starting weight: 242 lbs Starting date: 03/04/2021 Today's weight: 248 lbs Today's date: 10/29/2021 Total lbs lost to date: 0 Total lbs lost since last in-office visit: 0  Interim History: Gerald Evans is up 8 lbs since his last visit.  Subjective:   1. Vitamin D insufficiency Gerald Evans is taking Vitamin D currently.   2. Essential hypertension Gerald Evans's blood pressure is controlled. Her last blood pressure   3. Diabetes mellitus type 2 in obese Gerald Evans is currently taking Glucophage and Humulin.   Assessment/Plan:   1. Vitamin D insufficiency Low Vitamin D level contributes to fatigue and are associated with obesity, breast, and colon cancer. We will refill prescription Vitamin D 50,000 IU every week for 1 month with no refills and Eivan will follow-up for routine testing of Vitamin D, at least 2-3 times per year to avoid over-replacement.  - Vitamin D, Ergocalciferol, (DRISDOL) 1.25 MG (50000 UNIT) CAPS capsule; Take 1 capsule (50,000 Units total) by mouth every 7 (seven) days.  Dispense: 4 capsule; Refill: 0  2. Essential hypertension Gerald Evans will continue medications. He is working on healthy weight loss and exercise to improve blood pressure control. We will watch for signs of hypotension as he continues his lifestyle modifications.  3. Diabetes mellitus type 2 in obese Gerald Evans will continue taking Glucophage and Humulin. Good blood sugar control is important to decrease the likelihood of diabetic complications such as nephropathy, neuropathy, limb loss, blindness, coronary artery disease, and death. Intensive lifestyle  modification including diet, exercise and weight loss are the first line of treatment for diabetes.   4. Obesity, Current BMI 37.8 Gerald Evans is currently in the action stage of change. As such, his goal is to continue with weight loss efforts. He has agreed to the Category 2 Plan.   Gerald Evans will continue meal planning and she will continue intentional eating.   Exercise goals:  As is.  Behavioral modification strategies: increasing lean protein intake, decreasing simple carbohydrates, increasing vegetables, increasing water intake, decreasing eating out, no skipping meals, meal planning and cooking strategies, keeping healthy foods in the home, and planning for success.  Gerald Evans has agreed to follow-up with our clinic in 4 weeks with nurse practitioner. He was informed of the importance of frequent follow-up visits to maximize his success with intensive lifestyle modifications for his multiple health conditions.   Objective:   Blood pressure 127/67, pulse (!) 56, height 5' 6"  (1.676 m), weight 248 lb (112.5 kg), SpO2 95 %. Body mass index is 40.03 kg/m.  General: Cooperative, alert, well developed, in no acute distress. HEENT: Conjunctivae and lids unremarkable. Cardiovascular: Regular rhythm.  Lungs: Normal work of breathing. Neurologic: No focal deficits.   Lab Results  Component Value Date   CREATININE 1.20 07/15/2021   BUN 13 07/15/2021   NA 141 07/15/2021   K 3.8 07/15/2021   CL 105 07/15/2021   CO2 22 07/15/2021   Lab Results  Component Value Date   ALT 16 07/15/2021   AST 17 07/15/2021   ALKPHOS 93 07/15/2021   BILITOT 0.2 07/15/2021   Lab Results  Component Value Date   HGBA1C 9.7 (  A) 10/08/2021   HGBA1C 10.1 07/16/2021   HGBA1C 9.7 (A) 04/15/2021   HGBA1C 9.3 (A) 01/13/2021   HGBA1C 9.3 (A) 10/21/2020   No results found for: INSULIN Lab Results  Component Value Date   TSH 8.080 (H) 10/07/2021   Lab Results  Component Value Date   CHOL 140 03/04/2021    HDL 42 03/04/2021   LDLCALC 77 03/04/2021   TRIG 118 03/04/2021   CHOLHDL 3.1 03/18/2020   Lab Results  Component Value Date   VD25OH 34.1 03/04/2021   Lab Results  Component Value Date   WBC 13.6 (H) 08/21/2020   HGB 11.8 (L) 08/21/2020   HCT 35.7 (L) 08/21/2020   MCV 73.6 (L) 08/21/2020   PLT 198 08/21/2020   Lab Results  Component Value Date   IRON 68 05/25/2016   TIBC 279 05/25/2016   FERRITIN 187 05/25/2016   Attestation Statements:   Reviewed by clinician on day of visit: allergies, medications, problem list, medical history, surgical history, family history, social history, and previous encounter notes.  I, Lizbeth Bark, RMA, am acting as Location manager for [employer], DO.  I have reviewed the above documentation for accuracy and completeness, and I agree with the above. Jearld Lesch, DO

## 2021-11-19 ENCOUNTER — Encounter (INDEPENDENT_AMBULATORY_CARE_PROVIDER_SITE_OTHER): Payer: Self-pay | Admitting: Bariatrics

## 2021-12-01 ENCOUNTER — Encounter (INDEPENDENT_AMBULATORY_CARE_PROVIDER_SITE_OTHER): Payer: Self-pay | Admitting: Adult Health

## 2021-12-01 ENCOUNTER — Ambulatory Visit (INDEPENDENT_AMBULATORY_CARE_PROVIDER_SITE_OTHER): Payer: Medicare HMO | Admitting: Adult Health

## 2021-12-01 VITALS — BP 145/68 | HR 62 | Temp 97.4°F | Ht 66.0 in | Wt 249.0 lb

## 2021-12-01 DIAGNOSIS — E669 Obesity, unspecified: Secondary | ICD-10-CM | POA: Diagnosis not present

## 2021-12-01 DIAGNOSIS — E1165 Type 2 diabetes mellitus with hyperglycemia: Secondary | ICD-10-CM | POA: Diagnosis not present

## 2021-12-01 DIAGNOSIS — E559 Vitamin D deficiency, unspecified: Secondary | ICD-10-CM | POA: Diagnosis not present

## 2021-12-01 DIAGNOSIS — Z7984 Long term (current) use of oral hypoglycemic drugs: Secondary | ICD-10-CM

## 2021-12-01 DIAGNOSIS — Z6841 Body Mass Index (BMI) 40.0 and over, adult: Secondary | ICD-10-CM | POA: Diagnosis not present

## 2021-12-01 MED ORDER — VITAMIN D (ERGOCALCIFEROL) 1.25 MG (50000 UNIT) PO CAPS: 50000.0000 [IU] | ORAL_CAPSULE | ORAL | 0 refills | Status: DC

## 2021-12-02 LAB — VITAMIN D 25 HYDROXY (VIT D DEFICIENCY, FRACTURES): Vit D, 25-Hydroxy: 45.3 ng/mL (ref 30.0–100.0)

## 2021-12-08 NOTE — Progress Notes (Signed)
Chief Complaint:   OBESITY Gerald Evans is here to discuss his progress with his obesity treatment plan along with follow-up of his obesity related diagnoses. Gerald Evans is on the Category 2 Plan and states he is following his eating plan approximately 65% of the time. Gerald Evans states he is active while doing yard work, and walking for 45 minutes 3 times per week.  Today's visit was #: 12 Starting weight: 242 lbs Starting date: 03/04/2021 Today's weight: 249 lbs Today's date: 12/01/2021 Total lbs lost to date: 0 Total lbs lost since last in-office visit: 0  Interim History:  He reports eating off the plan when he snacks.  He will consume large amount of nuts and/or sugary treats.  Subjective:   1. Uncontrolled type 2 diabetes mellitus with hyperglycemia (HCC) Fasting blood sugar ranges between 130-200, usually <140.   Postprandials range between 200-230.   Endocrinology is managing metformin 1000 mg daily, Humulin 70/30-55 units twice daily.   10/08/21 A1c was 9.7- above goal.  2. Vitamin D insufficiency 03/04/2021 vitamin D level was 34.1.  Assessment/Plan:   1. Uncontrolled type 2 diabetes mellitus with hyperglycemia (HCC) Continue metformin 1000 mg daily, Humulin 70/30-55 units twice daily. Try to reduce sugar/CHO and increase protein intake. Continue to closely monitor home BG levels. F/u with Endocrinology as directed.  2. Vitamin D insufficiency We will check labs today and we will refill prescription vitamin D 50,000 units once weekly for 1 month.  - Vitamin D, Ergocalciferol, (DRISDOL) 1.25 MG (50000 UNIT) CAPS capsule; Take 1 capsule (50,000 Units total) by mouth every 7 (seven) days.  Dispense: 4 capsule; Refill: 0 - VITAMIN D 25 Hydroxy (Vit-D Deficiency, Fractures)  3. Obesity, Current BMI 40.3 Gerald Evans is currently in the action stage of change. As such, his goal is to continue with weight loss efforts. He has agreed to the Category 2 Plan.   Handouts: 200 snack  calories sheet.       High protein/low cholesterol sheet.  Exercise goals: As is.  Behavioral modification strategies: meal planning and cooking strategies, keeping healthy foods in the home, and planning for success.  Gerald Evans has agreed to follow-up with our clinic in 4 weeks. He was informed of the importance of frequent follow-up visits to maximize his success with intensive lifestyle modifications for his multiple health conditions.   Gerald Evans was informed we would discuss his lab results at his next visit unless there is a critical issue that needs to be addressed sooner. Gerald Evans agreed to keep his next visit at the agreed upon time to discuss these results.  Objective:   Blood pressure (!) 145/68, pulse 62, temperature (!) 97.4 F (36.3 C), height 5' 6"  (1.676 m), weight 249 lb (112.9 kg), SpO2 97 %. Body mass index is 40.19 kg/m.  General: Cooperative, alert, well developed, in no acute distress. HEENT: Conjunctivae and lids unremarkable. Cardiovascular: Regular rhythm.  Lungs: Normal work of breathing. Neurologic: No focal deficits.   Lab Results  Component Value Date   CREATININE 1.20 07/15/2021   BUN 13 07/15/2021   NA 141 07/15/2021   K 3.8 07/15/2021   CL 105 07/15/2021   CO2 22 07/15/2021   Lab Results  Component Value Date   ALT 16 07/15/2021   AST 17 07/15/2021   ALKPHOS 93 07/15/2021   BILITOT 0.2 07/15/2021   Lab Results  Component Value Date   HGBA1C 9.7 (A) 10/08/2021   HGBA1C 10.1 07/16/2021   HGBA1C 9.7 (A) 04/15/2021   HGBA1C  9.3 (A) 01/13/2021   HGBA1C 9.3 (A) 10/21/2020   No results found for: "INSULIN" Lab Results  Component Value Date   TSH 8.080 (H) 10/07/2021   Lab Results  Component Value Date   CHOL 140 03/04/2021   HDL 42 03/04/2021   LDLCALC 77 03/04/2021   TRIG 118 03/04/2021   CHOLHDL 3.1 03/18/2020   Lab Results  Component Value Date   VD25OH 45.3 12/01/2021   VD25OH 34.1 03/04/2021   Lab Results  Component Value  Date   WBC 13.6 (H) 08/21/2020   HGB 11.8 (L) 08/21/2020   HCT 35.7 (L) 08/21/2020   MCV 73.6 (L) 08/21/2020   PLT 198 08/21/2020   Lab Results  Component Value Date   IRON 68 05/25/2016   TIBC 279 05/25/2016   FERRITIN 187 05/25/2016    Obesity Behavioral Intervention:   Approximately 15 minutes were spent on the discussion below.  ASK: We discussed the diagnosis of obesity with Gerald Evans today and Gerald Evans agreed to give [place] permission to discuss obesity behavioral modification therapy today.  ASSESS: Gerald Evans has the diagnosis of obesity and his BMI today is 40.3. Gerald Evans is in the action stage of change.   ADVISE: Gerald Evans was educated on the multiple health risks of obesity as well as the benefit of weight loss to improve his health. He was advised of the need for long term treatment and the importance of lifestyle modifications to improve his current health and to decrease his risk of future health problems.  AGREE: Multiple dietary modification options and treatment options were discussed and Gerald Evans agreed to follow the recommendations documented in the above note.  ARRANGE: Gerald Evans was educated on the importance of frequent visits to treat obesity as outlined per CMS and USPSTF guidelines and agreed to schedule his next follow up appointment today.  Attestation Statements:   Reviewed by clinician on day of visit: allergies, medications, problem list, medical history, surgical history, family history, social history, and previous encounter notes.   Gerald Evans, am acting as transcriptionist for [name], NP.  I have reviewed the above documentation for accuracy and completeness, and I agree with the above. -  Gerald Evans d. Gerald Averitt, NP-C

## 2021-12-31 ENCOUNTER — Encounter (INDEPENDENT_AMBULATORY_CARE_PROVIDER_SITE_OTHER): Payer: Self-pay | Admitting: Bariatrics

## 2021-12-31 ENCOUNTER — Ambulatory Visit (INDEPENDENT_AMBULATORY_CARE_PROVIDER_SITE_OTHER): Payer: Medicare HMO | Admitting: Bariatrics

## 2021-12-31 VITALS — BP 130/65 | HR 67 | Temp 97.6°F | Ht 66.0 in | Wt 248.0 lb

## 2021-12-31 DIAGNOSIS — I10 Essential (primary) hypertension: Secondary | ICD-10-CM | POA: Diagnosis not present

## 2021-12-31 DIAGNOSIS — E559 Vitamin D deficiency, unspecified: Secondary | ICD-10-CM

## 2021-12-31 DIAGNOSIS — Z6841 Body Mass Index (BMI) 40.0 and over, adult: Secondary | ICD-10-CM | POA: Diagnosis not present

## 2021-12-31 DIAGNOSIS — E669 Obesity, unspecified: Secondary | ICD-10-CM

## 2021-12-31 MED ORDER — VITAMIN D (ERGOCALCIFEROL) 1.25 MG (50000 UNIT) PO CAPS: 50000.0000 [IU] | ORAL_CAPSULE | ORAL | 0 refills | Status: DC

## 2021-12-31 NOTE — Progress Notes (Unsigned)
Chief Complaint:   OBESITY Gerald Evans is here to discuss his progress with his obesity treatment plan along with follow-up of his obesity related diagnoses. Gerald Evans is on the Category 2 Plan and states he is following his eating plan approximately 60% of the time. Gerald Evans states he is at the gym for 30-45 minutes 3 times per week.  Today's visit was #: 40 Starting weight: 242 lbs Starting date: 03/04/2021 Today's weight: 248 lbs Today's date: 12/31/2021 Total lbs lost to date: 0 Total lbs lost since last in-office visit: 1  Interim History: Gerald Evans is down 1 pound since his last visit.  Subjective:   1. Vitamin D insufficiency Gerald Evans is taking vitamin D as directed.  2. Essential hypertension, benign Gerald Evans's blood pressure is controlled.  Assessment/Plan:   1. Vitamin D insufficiency Gerald Evans will continue prescription vitamin D 50,000 units once weekly, and we will refill for 1 month.  - Vitamin D, Ergocalciferol, (DRISDOL) 1.25 MG (50000 UNIT) CAPS capsule; Take 1 capsule (50,000 Units total) by mouth every 7 (seven) days.  Dispense: 4 capsule; Refill: 0  2. Essential hypertension, benign Gerald Evans will continue his medications as directed.  We will follow-up at his next visit.  3. Obesity, Current BMI 40.1 Gerald Evans is currently in the action stage of change. As such, his goal is to continue with weight loss efforts. He has agreed to the Category 2 Plan.   Gerald Evans will adhere more closely to the plan 80-90%.  Mindful eating was discussed.  Exercise goals: As is.  Behavioral modification strategies: increasing lean protein intake, decreasing simple carbohydrates, increasing vegetables, increasing water intake, meal planning and cooking strategies, and keeping healthy foods in the home.  Gerald Evans has agreed to follow-up with our clinic in 3 to 4 weeks. He was informed of the importance of frequent follow-up visits to maximize his success with intensive lifestyle modifications  for his multiple health conditions.   Objective:   Blood pressure 130/65, pulse 67, temperature 97.6 F (36.4 C), height 5' 6"  (1.676 m), weight 248 lb (112.5 kg), SpO2 98 %. Body mass index is 40.03 kg/m.  General: Cooperative, alert, well developed, in no acute distress. HEENT: Conjunctivae and lids unremarkable. Cardiovascular: Regular rhythm.  Lungs: Normal work of breathing. Neurologic: No focal deficits.   Lab Results  Component Value Date   CREATININE 1.20 07/15/2021   BUN 13 07/15/2021   NA 141 07/15/2021   K 3.8 07/15/2021   CL 105 07/15/2021   CO2 22 07/15/2021   Lab Results  Component Value Date   ALT 16 07/15/2021   AST 17 07/15/2021   ALKPHOS 93 07/15/2021   BILITOT 0.2 07/15/2021   Lab Results  Component Value Date   HGBA1C 9.7 (A) 10/08/2021   HGBA1C 10.1 07/16/2021   HGBA1C 9.7 (A) 04/15/2021   HGBA1C 9.3 (A) 01/13/2021   HGBA1C 9.3 (A) 10/21/2020   No results found for: "INSULIN" Lab Results  Component Value Date   TSH 8.080 (H) 10/07/2021   Lab Results  Component Value Date   CHOL 140 03/04/2021   HDL 42 03/04/2021   LDLCALC 77 03/04/2021   TRIG 118 03/04/2021   CHOLHDL 3.1 03/18/2020   Lab Results  Component Value Date   VD25OH 45.3 12/01/2021   VD25OH 34.1 03/04/2021   Lab Results  Component Value Date   WBC 13.6 (H) 08/21/2020   HGB 11.8 (L) 08/21/2020   HCT 35.7 (L) 08/21/2020   MCV 73.6 (L) 08/21/2020   PLT 198  08/21/2020   Lab Results  Component Value Date   IRON 68 05/25/2016   TIBC 279 05/25/2016   FERRITIN 187 05/25/2016   Attestation Statements:   Reviewed by clinician on day of visit: allergies, medications, problem list, medical history, surgical history, family history, social history, and previous encounter notes.   Wilhemena Durie, am acting as Location manager for [employer], DO.  I have reviewed the above documentation for accuracy and completeness, and I agree with the above. Jearld Lesch, DO

## 2022-01-01 ENCOUNTER — Encounter (INDEPENDENT_AMBULATORY_CARE_PROVIDER_SITE_OTHER): Payer: Self-pay | Admitting: Bariatrics

## 2022-01-02 LAB — T4, FREE: Free T4: 1.03 ng/dL (ref 0.82–1.77)

## 2022-01-02 LAB — TSH: TSH: 4.21 u[IU]/mL (ref 0.450–4.500)

## 2022-01-07 ENCOUNTER — Ambulatory Visit (INDEPENDENT_AMBULATORY_CARE_PROVIDER_SITE_OTHER): Payer: Medicare HMO | Admitting: Nurse Practitioner

## 2022-01-07 ENCOUNTER — Encounter: Payer: Self-pay | Admitting: Nurse Practitioner

## 2022-01-07 VITALS — BP 130/69 | HR 74 | Ht 66.0 in | Wt 252.0 lb

## 2022-01-07 DIAGNOSIS — I10 Essential (primary) hypertension: Secondary | ICD-10-CM

## 2022-01-07 DIAGNOSIS — E039 Hypothyroidism, unspecified: Secondary | ICD-10-CM

## 2022-01-07 DIAGNOSIS — E782 Mixed hyperlipidemia: Secondary | ICD-10-CM | POA: Diagnosis not present

## 2022-01-07 DIAGNOSIS — E1165 Type 2 diabetes mellitus with hyperglycemia: Secondary | ICD-10-CM | POA: Diagnosis not present

## 2022-01-07 LAB — POCT GLYCOSYLATED HEMOGLOBIN (HGB A1C): HbA1c POC (<> result, manual entry): 8.6 % (ref 4.0–5.6)

## 2022-01-07 MED ORDER — LEVOTHYROXINE SODIUM 50 MCG PO TABS: 50.0000 ug | ORAL_TABLET | Freq: Every day | ORAL | 1 refills | Status: DC

## 2022-01-07 MED ORDER — ONETOUCH ULTRA VI STRP: ORAL_STRIP | 1 refills | Status: DC

## 2022-01-07 NOTE — Patient Instructions (Signed)

## 2022-01-07 NOTE — Progress Notes (Signed)
01/07/2022, 11:42 AM  Endocrinology follow-up note   Subjective:    Patient ID: Gerald Evans, male    DOB: April 30, 1946.  Gerald Evans is being seen in follow-up after he was seen in consultation for management of currently uncontrolled symptomatic diabetes requested by  Carrolyn Meiers, MD.   Past Medical History:  Diagnosis Date   Colon polyps    Diabetes mellitus without complication ([place])    Edema of both lower extremities    Fatty liver    Hypercholesteremia    Hypertension     Past Surgical History:  Procedure Laterality Date   COLONOSCOPY     COLONOSCOPY  04/21/2012   Procedure: COLONOSCOPY;  Surgeon: Rogene Houston, MD;  [facility]: [facility];  Service: Endoscopy;  Laterality: N/A;  1200   COLONOSCOPY N/A 09/09/2017   Procedure: COLONOSCOPY;  Surgeon: Rogene Houston, MD;  Location: [facility];  Service: Endoscopy;  Laterality: N/A;  1200-rescheduled to 3/14/ _0 :00pm per [place]     POLYPECTOMY  09/09/2017   Procedure: POLYPECTOMY;  Surgeon: Rogene Houston, MD;  Location: [facility];  Service: Endoscopy;;  Transverse Colon ([facility])    Social History   Socioeconomic History   Marital status: Married    Spouse name: Not on file   Number of children: 2   Years of education: Not on file   Highest education level: Not on file  Occupational History   Occupation: retired  Tobacco Use   Smoking status: Former    Packs/day: 0.50    Years: 20.00    Total pack years: 10.00    Types: Cigarettes   Smokeless tobacco: Never  Vaping Use   Vaping Use: Never used  Substance and Sexual Activity   Alcohol use: Yes    Alcohol/week: 4.0 standard drinks of alcohol    Types: 4 Standard drinks or equivalent per week   Drug use: No   Sexual activity: Not on file  Other Topics Concern   Not on file  Social History Narrative   Not on file   Social Determinants of  Health   Financial Resource Strain: Not on file  Food Insecurity: Not on file  Transportation Needs: Not on file  Physical Activity: Not on file  Stress: Not on file  Social Connections: Not on file    Family History  Problem Relation Age of Onset   Hypertension Mother    Hypertension Father    Alcoholism Father    Obesity Father    Diabetes Sister    Hypertension Sister    Healthy Daughter    Healthy Son    Colon cancer Neg Hx     Outpatient Encounter Medications as of 01/07/2022  Medication Sig   amLODipine (NORVASC) 10 MG tablet Take 10 mg by mouth daily.   blood glucose meter kit and supplies Dispense based on patient and insurance preference. Use up to four times daily as directed. (FOR ICD-10 E11.65)   cloNIDine (CATAPRES) 0.2 MG tablet Take 1 tablet by mouth twice daily   COVID-19 mRNA bivalent vaccine, Pfizer, (PFIZER COVID-19 VAC BIVALENT) injection Inject into  the muscle.   glucose blood (ONETOUCH ULTRA) test strip Use 1 strip to check glucose 3 times daily as directed.   hydrochlorothiazide (HYDRODIURIL) 25 MG tablet Take 25 mg by mouth daily.   insulin isophane & regular human KwikPen (NOVOLIN 70/30 KWIKPEN) (70-30) 100 UNIT/ML KwikPen Inject 55 Units into the skin 2 (two) times daily with a meal. Inject 55 units SQ BID with meals if glucose is above 90 and he is eating.   levothyroxine (SYNTHROID) 50 MCG tablet Take 1 tablet (50 mcg total) by mouth daily before breakfast.   losartan (COZAAR) 100 MG tablet Take 100 mg by mouth daily.   metFORMIN (GLUCOPHAGE) 1000 MG tablet Take 1,000 mg by mouth daily with breakfast.   metoprolol (LOPRESSOR) 100 MG tablet Take 100 mg by mouth 2 (two) times daily.    OneTouch Delica Lancets 17E MISC 1 each by Does not apply route 3 (three) times daily. Use to check blood glucose three times daily   sildenafil (VIAGRA) 100 MG tablet Take 100 mg by mouth daily as needed.   simvastatin (ZOCOR) 20 MG tablet Take 20 mg by mouth daily.    Vitamin D, Ergocalciferol, (DRISDOL) 1.25 MG (50000 UNIT) CAPS capsule Take 1 capsule (50,000 Units total) by mouth every 7 (seven) days.   [DISCONTINUED] glucose blood (ONETOUCH ULTRA) test strip Use 1 strip to check glucose 3 times daily as directed.   [DISCONTINUED] levothyroxine (SYNTHROID) 25 MCG tablet Take 1 tablet (25 mcg total) by mouth daily.   No facility-administered encounter medications on file as of 01/07/2022.    ALLERGIES: Allergies  Allergen Reactions   Lisinopril     cough    VACCINATION STATUS: Immunization History  Administered Date(s) Administered   PFIZER Comirnaty(Gray Top)Covid-19 Tri-Sucrose Vaccine 11/12/2020   PFIZER(Purple Top)SARS-COV-2 Vaccination 08/20/2019, 09/12/2019, 04/25/2020   Pfizer Covid-19 Vaccine Bivalent Booster 59yr & up 07/17/2021    Diabetes He presents for his follow-up diabetic visit. He has type 2 diabetes mellitus. Onset time: He was diagnosed at approximate age of 89years. His disease course has been improving. There are no hypoglycemic associated symptoms. Pertinent negatives for hypoglycemia include no confusion, headaches, nervousness/anxiousness, pallor or seizures. Associated symptoms include fatigue. Pertinent negatives for diabetes include no chest pain, no polydipsia, no polyphagia, no polyuria, no weakness and no weight loss. There are no hypoglycemic complications. Symptoms are stable. Diabetic complications include heart disease, impotence and nephropathy. Risk factors for coronary artery disease include dyslipidemia, diabetes mellitus, family history, tobacco exposure, sedentary lifestyle, male sex, obesity and hypertension. Current diabetic treatment includes oral agent (monotherapy) and insulin injections. He is compliant with treatment most of the time. His weight is fluctuating minimally. He is following a generally unhealthy diet. When asked about meal planning, he reported none. He has not had a previous visit with a  dietitian. He participates in exercise three times a week. His home blood glucose trend is decreasing steadily. His overall blood glucose range is 140-180 mg/dl. (He presents today with his meter, no logs, showing inconsistent glucose monitoring (although some better) showing above target glycemic profile.  His POCT A1c today is 8.6%, improving from last visit of 9.7%.  He denies any significant hypoglycemia.  Analysis of his meter shows 7-day average of 171 (9 readings); 14-day average of 191 (17 readings); 30-day average of 184 (34 readings).) An ACE inhibitor/angiotensin II receptor blocker is being taken. He does not see a podiatrist.Eye exam is current.  Hyperlipidemia This is a chronic problem. The current episode  started more than 1 year ago. The problem is controlled. Recent lipid tests were reviewed and are normal. Exacerbating diseases include chronic renal disease, diabetes and obesity. Factors aggravating his hyperlipidemia include fatty foods, beta blockers and thiazides. Pertinent negatives include no chest pain, myalgias or shortness of breath. Current antihyperlipidemic treatment includes statins. The current treatment provides mild improvement of lipids. Compliance problems include adherence to diet and adherence to exercise.  Risk factors for coronary artery disease include diabetes mellitus, dyslipidemia, hypertension, male sex, obesity, a sedentary lifestyle and family history.  Hypertension This is a chronic problem. The current episode started more than 1 year ago. The problem has been waxing and waning since onset. The problem is uncontrolled. Pertinent negatives include no chest pain, headaches, neck pain, palpitations or shortness of breath. There are no associated agents to hypertension. Risk factors for coronary artery disease include dyslipidemia, diabetes mellitus, male gender, obesity and smoking/tobacco exposure. Past treatments include angiotensin blockers, diuretics, beta  blockers and central alpha agonists. The current treatment provides mild improvement. There are no compliance problems.  Hypertensive end-organ damage includes kidney disease and CAD/MI. Identifiable causes of hypertension include chronic renal disease and a thyroid problem.  Thyroid Problem Presents for initial visit. Symptoms include fatigue and weight gain. Patient reports no anxiety, cold intolerance, constipation, depressed mood, hair loss, leg swelling, palpitations or weight loss. The symptoms have been stable. Past treatments include nothing. His past medical history is significant for diabetes, hyperlipidemia and obesity. There are no known risk factors.    Review of systems  Constitutional: + stable body weight,  current Body mass index is 40.67 kg/m. , + fatigue, no subjective hyperthermia, no subjective hypothermia Eyes: no blurry vision, no xerophthalmia ENT: no sore throat, no nodules palpated in throat, no dysphagia/odynophagia, no hoarseness Cardiovascular: no chest pain, no shortness of breath, no palpitations, no leg swelling Respiratory: no cough, no shortness of breath Gastrointestinal: no nausea/vomiting/diarrhea Musculoskeletal: no muscle/joint aches Skin: no rashes, no hyperemia Neurological: no tremors, no numbness, no tingling, no dizziness Psychiatric: no depression, no anxiety  Objective:    BP 130/69   Pulse 74   Ht _0  (1.676 m)   Wt 252 lb (114.3 kg)   BMI 40.67 kg/m   Wt Readings from Last 3 Encounters:  01/07/22 252 lb (114.3 kg)  12/31/21 248 lb (112.5 kg)  12/01/21 249 lb (112.9 kg)    BP Readings from Last 3 Encounters:  01/07/22 130/69  12/31/21 130/65  12/01/21 (!) 145/68     Physical Exam- Limited  Constitutional:  Body mass index is 40.67 kg/m. , not in acute distress, normal state of mind Eyes:  EOMI, no exophthalmos Neck: Supple Cardiovascular: RRR, no murmurs, rubs, or gallops, no edema Respiratory: Adequate breathing efforts,  no crackles, rales, rhonchi, or wheezing Musculoskeletal: no gross deformities, strength intact in all four extremities, no gross restriction of joint movements Skin:  no rashes, no hyperemia Neurological: no tremor with outstretched hands   CMP ( most recent) CMP     Component Value Date/Time   NA 141 07/15/2021 0912   K 3.8 07/15/2021 0912   CL 105 07/15/2021 0912   CO2 22 07/15/2021 0912   GLUCOSE 109 (H) 07/15/2021 0912   GLUCOSE 234 (H) 08/21/2020 1437   BUN 13 07/15/2021 0912   CREATININE 1.20 07/15/2021 0912   CREATININE 1.00 03/18/2020 1321   CALCIUM 9.4 07/15/2021 0912   PROT 7.3 07/15/2021 0912   ALBUMIN 4.6 07/15/2021 0912   AST  17 07/15/2021 0912   ALT 16 07/15/2021 0912   ALKPHOS 93 07/15/2021 0912   BILITOT 0.2 07/15/2021 0912   GFRNONAA >60 08/21/2020 1437   GFRNONAA 74 03/18/2020 1321   GFRAA 67 07/17/2020 0953   GFRAA 86 03/18/2020 1321    Diabetic Labs (most recent): Lab Results  Component Value Date   HGBA1C 8.6 01/07/2022   HGBA1C 9.7 (A) 10/08/2021   HGBA1C 10.1 07/16/2021   MICROALBUR 150 04/15/2021   MICROALBUR 80 03/20/2020     Assessment & Plan:   1) Uncontrolled type 2 diabetes mellitus with hyperglycemia ([place])  - Gerald Evans has currently uncontrolled symptomatic type 2 DM since  76 years of age.  He presents today with his meter, no logs, showing inconsistent glucose monitoring (although some better) showing above target glycemic profile.  His POCT A1c today is 8.6%, improving from last visit of 9.7%.  He denies any significant hypoglycemia.  Analysis of his meter shows 7-day average of 171 (9 readings); 14-day average of 191 (17 readings); 30-day average of 184 (34 readings).   Recent labs reviewed.   - I had a long discussion with him about the progressive nature of diabetes and the pathology behind its complications.  -his diabetes is complicated by obesity/sedentary life and he remains at a high risk for more acute and chronic  complications which include CAD, CVA, CKD, retinopathy, and neuropathy. These are all discussed in detail with him.  - Nutritional counseling repeated at each appointment due to patients tendency to fall back in to old habits.  - The patient admits there is a room for improvement in their diet and drink choices. -  Suggestion is made for the patient to avoid simple carbohydrates from their diet including Cakes, Sweet Desserts / Pastries, Ice Cream, Soda (diet and regular), Sweet Tea, Candies, Chips, Cookies, Sweet Pastries, Store Bought Juices, Alcohol in Excess of 1-2 drinks a day, Artificial Sweeteners, Coffee Creamer, and "Sugar-free" Products. This will help patient to have stable blood glucose profile and potentially avoid unintended weight gain.   - I encouraged the patient to switch to unprocessed or minimally processed complex starch and increased protein intake (animal or plant source), fruits, and vegetables.   - Patient is advised to stick to a routine mealtimes to eat 3 meals a day and avoid unnecessary snacks (to snack only to correct hypoglycemia).  - I have approached him with the following individualized plan to manage  his diabetes and patient agrees:   - he will continue to require insulin treatment in order for him to achieve and maintain control of diabetes to target.    -He is benefiting from simplified insulin regimen with premixed insulin.    -Based on his recent improvement in his daily readings, no changes will be made to his medication regimen today.  He is advised to continue his 70/30 at 55 units SQ BID with meals if glucose is above 90 and he is eating.  He can also continue Metformin 1000 mg po daily with breakfast (kidneys are stable).   -He is encouraged to do better monitoring his blood glucose.  He is urged to monitor glucose at least twice per day, before injecting insulin (at breakfast and supper) and call the clinic if readings are less than 70 or greater than  300 for 3 tests in a row.  He could benefit from CGM device.  Sent in Rx to [facility] for Dexcom G7.  He will be considered for incretin therapy  as needed on subsequent visits.  - Specific targets for  A1c;  LDL, HDL,  and Triglycerides were discussed with the patient.  2) Blood Pressure /Hypertension:  His blood pressure is controlled to target for his age.  He is advised to continue Clonidine 0.2 mg po twice daily, HCTZ 25 mg po daily, Losartan 100 mg po daily, and Metoprolol 100 mg po twice daily.    3) Lipids/Hyperlipidemia:  His most recent lipid panel from 03/04/21 shows controlled LDL of 77.  He is advised to continue Simvastatin 20 mg po daily at bedtime.  Side effects and precautions discussed with him.    4)  Weight/Diet:  His Body mass index is 40.67 kg/m.  -   clearly complicating his diabetes care.   he is  a candidate for weight loss. I discussed with him the fact that loss of 5 - 10% of his  current body weight will have the most impact on his diabetes management.  Exercise, and detailed carbohydrates information provided  -  detailed on discharge instructions.  5) Hypothyroidism-acquired He does not have personal or family history of thyroid dysfunction.  His antibody testing was negative ruling out autoimmune thyroid dysfunction.    His previsit thyroid function tests are consistent with slight under-replacement.  He is advised to increase his Levothyroxine to 50 mcg po daily before breakfast (can take 2 of his 25 mcg tabs until he depletes his current supply).   - The correct intake of thyroid hormone (Levothyroxine, Synthroid), is on empty stomach first thing in the morning, with water, separated by at least 30 minutes from breakfast and other medications,  and separated by more than 4 hours from calcium, iron, multivitamins, acid reflux medications (PPIs).  - This medication is a life-long medication and will be needed to correct thyroid hormone imbalances for the rest of your  life.  The dose may change from time to time, based on thyroid blood work.  - It is extremely important to be consistent taking this medication, near the same time each morning.  -AVOID TAKING PRODUCTS CONTAINING BIOTIN (commonly found in Hair, Skin, Nails vitamins) AS IT INTERFERES WITH THE VALIDITY OF THYROID FUNCTION BLOOD TESTS.  6) Chronic Care/Health Maintenance: -he is on ACEI/ARB and Statin medications and is encouraged to initiate and continue to follow up with Ophthalmology, Dentist,  Podiatrist at least yearly or according to recommendations, and advised to  stay away from smoking. I have recommended yearly flu vaccine and pneumonia vaccine at least every 5 years; moderate intensity exercise for up to 150 minutes weekly; and  sleep for at least 7 hours a day.  - he is advised to maintain close follow up with Carrolyn Meiers, MD for primary care needs, as well as his other providers for optimal and coordinated care.     I spent 41 minutes in the care of the patient today including review of labs from [place], Lipids, Thyroid Function, Hematology (current and previous including abstractions from other facilities); face-to-face time discussing  his blood glucose readings/logs, discussing hypoglycemia and hyperglycemia episodes and symptoms, medications doses, his options of short and long term treatment based on the latest standards of care / guidelines;  discussion about incorporating lifestyle medicine;  and documenting the encounter. Risk reduction counseling performed per USPSTF guidelines to reduce obesity and cardiovascular risk factors.     Please refer to Patient Instructions for Blood Glucose Monitoring and Insulin/Medications Dosing Guide"  in media tab for additional information. Please  also refer to " Patient Self Inventory" in the Media  tab for reviewed elements of pertinent patient history.  Gerald Evans participated in the discussions, expressed understanding, and  voiced agreement with the above plans.  All questions were answered to his satisfaction. he is encouraged to contact clinic should he have any questions or concerns prior to his return visit.  Follow up plan: - Return in about 4 months (around 05/10/2022) for Diabetes F/U with A1c in office, Previsit labs, Thyroid follow up, Bring meter and logs.  Rayetta Pigg, [facility] [address] [place], [place] [address] Phone: [phone] Fax: [phone]  01/07/2022, 11:42 AM

## 2022-01-14 ENCOUNTER — Other Ambulatory Visit: Payer: Medicare HMO

## 2022-01-14 DIAGNOSIS — R972 Elevated prostate specific antigen [PSA]: Secondary | ICD-10-CM

## 2022-01-15 ENCOUNTER — Encounter: Payer: Self-pay | Admitting: Urology

## 2022-01-15 ENCOUNTER — Ambulatory Visit (INDEPENDENT_AMBULATORY_CARE_PROVIDER_SITE_OTHER): Payer: Medicare HMO | Admitting: Urology

## 2022-01-15 VITALS — BP 125/62 | HR 82

## 2022-01-15 DIAGNOSIS — R972 Elevated prostate specific antigen [PSA]: Secondary | ICD-10-CM | POA: Diagnosis not present

## 2022-01-15 DIAGNOSIS — N401 Enlarged prostate with lower urinary tract symptoms: Secondary | ICD-10-CM | POA: Diagnosis not present

## 2022-01-15 DIAGNOSIS — Z87440 Personal history of urinary (tract) infections: Secondary | ICD-10-CM

## 2022-01-15 DIAGNOSIS — R3915 Urgency of urination: Secondary | ICD-10-CM

## 2022-01-15 DIAGNOSIS — N138 Other obstructive and reflux uropathy: Secondary | ICD-10-CM

## 2022-01-15 LAB — MICROSCOPIC EXAMINATION
Bacteria, UA: NONE SEEN
Epithelial Cells (non renal): NONE SEEN /hpf (ref 0–10)
RBC, Urine: NONE SEEN /hpf (ref 0–2)
Renal Epithel, UA: NONE SEEN /hpf
WBC, UA: NONE SEEN /hpf (ref 0–5)

## 2022-01-15 LAB — URINALYSIS, ROUTINE W REFLEX MICROSCOPIC
Bilirubin, UA: NEGATIVE
Glucose, UA: NEGATIVE
Ketones, UA: NEGATIVE
Leukocytes,UA: NEGATIVE
Nitrite, UA: NEGATIVE
RBC, UA: NEGATIVE
Specific Gravity, UA: 1.015 (ref 1.005–1.030)
Urobilinogen, Ur: 1 mg/dL (ref 0.2–1.0)
pH, UA: 7 (ref 5.0–7.5)

## 2022-01-15 LAB — PSA, TOTAL AND FREE
PSA, Free Pct: 19.2 %
PSA, Free: 0.94 ng/mL
Prostate Specific Ag, Serum: 4.9 ng/mL — ABNORMAL HIGH (ref 0.0–4.0)

## 2022-01-15 NOTE — Progress Notes (Signed)
Subjective: 1. Elevated PSA   2. BPH with urinary obstruction   3. Urgency of urination   4. Personal history of urinary infection      01/15/22: Mr. Gerald Evans returns today in f/u for his elevated PSA of 5.  It is down to 4.9 prior to this visit.   His UA is clear.  His IPSS is 4 with nocturia x 2. He is taking an antiacid with the sildenafil which has eliminated the heartburn.   He has been started on furosemide for ankle edema but isn't having too much frequency with that.  He has had prior UTI's but his UA is clear today.   10/23/21: Mr. Gerald Evans returns today in f/u for his history of an elevated PSA associated with a UTI.   The PSA was back down to 2.6 after treatment but is back up to 5.0 with an 18.4% f/t ratio prior to this visit.  He has had no recurrent UTI's.  His IPSS today is 5 with nocturia x 2.  He has ED and continues to use sildenafil but he gets heartburn with it.  His UA is unremarkable.  He has an issue with the 3rd finger on the right hand to straightening completely.    10/24/20: Mr. Gerald Evans returns today with a repeat PSA which is down to 2.6 with a 26% f/t ratio after completing his antibiotics.   His UA is clear.     GU Hx: Gerald Evans is a 76 yo male who is sent by Dr. Legrand Rams for the evaluation of an elevated PSA of 36 on 08/23/20.  He thinks his PSA was about 3.1 in 2021.  He had been seen in the ER on 2/23 and was found to have a e. Coli UTI.   He was treated with a 7 day course of keflex.  A CT showed changes consistent with cystitis.  He had previously seen Dr. Diona Fanti for ED. His UA is clear today.  He has a little residual initial dysuria.  He has moderate LUTS with urgency and occasional UUI. He doesn't always feel empty.  He has a good stream.  He has nocturia x 2.   He has had no prior UTI's or GU surgery.   He is a diabetic and has HTN.  ROS:  Review of Systems  Cardiovascular:  Positive for leg swelling.  Skin:  Positive for itching.  All other systems reviewed and are  negative.   Allergies  Allergen Reactions   Lisinopril     cough    Past Medical History:  Diagnosis Date   Colon polyps    Diabetes mellitus without complication ([place])    Edema of both lower extremities    Fatty liver    Hypercholesteremia    Hypertension     Past Surgical History:  Procedure Laterality Date   COLONOSCOPY     COLONOSCOPY  04/21/2012   Procedure: COLONOSCOPY;  Surgeon: Rogene Houston, MD;  [facility];  Service: Endoscopy;  Laterality: N/A;  1200   COLONOSCOPY N/A 09/09/2017   Procedure: COLONOSCOPY;  Surgeon: Rogene Houston, MD;  Location: [facility];  Service: Endoscopy;  Laterality: N/A;  1200-rescheduled to 3/14/ _0 :00pm per [place]     POLYPECTOMY  09/09/2017   Procedure: POLYPECTOMY;  Surgeon: Rogene Houston, MD;  Location: [facility];  Service: Endoscopy;;  Transverse Colon (CB)    Social History   Socioeconomic History   Marital status: Married  Spouse name: Not on file   Number of children: 2   Years of education: Not on file   Highest education level: Not on file  Occupational History   Occupation: retired  Tobacco Use   Smoking status: Former    Packs/day: 0.50    Years: 20.00    Total pack years: 10.00    Types: Cigarettes   Smokeless tobacco: Never  Vaping Use   Vaping Use: Never used  Substance and Sexual Activity   Alcohol use: Yes    Alcohol/week: 4.0 standard drinks of alcohol    Types: 4 Standard drinks or equivalent per week   Drug use: No   Sexual activity: Not on file  Other Topics Concern   Not on file  Social History Narrative   Not on file   Social Determinants of Health   Financial Resource Strain: Not on file  Food Insecurity: Not on file  Transportation Needs: Not on file  Physical Activity: Not on file  Stress: Not on file  Social Connections: Not on file  Intimate Partner Violence: Not on file    Family History  Problem Relation Age of Onset   Hypertension Mother     Hypertension Father    Alcoholism Father    Obesity Father    Diabetes Sister    Hypertension Sister    Healthy Daughter    Healthy Son    Colon cancer Neg Hx     Anti-infectives: Anti-infectives (From admission, onward)    None       Current Outpatient Medications  Medication Sig Dispense Refill   furosemide (LASIX) 20 MG tablet Take 20 mg by mouth daily.     amLODipine (NORVASC) 10 MG tablet Take 10 mg by mouth daily.     blood glucose meter kit and supplies Dispense based on patient and insurance preference. Use up to four times daily as directed. (FOR ICD-10 E11.65) 1 each 0   cloNIDine (CATAPRES) 0.2 MG tablet Take 1 tablet by mouth twice daily 180 tablet 0   COVID-19 mRNA bivalent vaccine, Pfizer, (PFIZER COVID-19 VAC BIVALENT) injection Inject into the muscle. 0.3 mL 0   glucose blood (ONETOUCH ULTRA) test strip Use 1 strip to check glucose 3 times daily as directed. 300 each 1   hydrochlorothiazide (HYDRODIURIL) 25 MG tablet Take 25 mg by mouth daily.     insulin isophane & regular human KwikPen (NOVOLIN 70/30 KWIKPEN) (70-30) 100 UNIT/ML KwikPen Inject 55 Units into the skin 2 (two) times daily with a meal. Inject 55 units SQ BID with meals if glucose is above 90 and he is eating. 90 mL 0   levothyroxine (SYNTHROID) 50 MCG tablet Take 1 tablet (50 mcg total) by mouth daily before breakfast. 90 tablet 1   losartan (COZAAR) 100 MG tablet Take 100 mg by mouth daily.     metFORMIN (GLUCOPHAGE) 1000 MG tablet Take 1,000 mg by mouth daily with breakfast.     metoprolol (LOPRESSOR) 100 MG tablet Take 100 mg by mouth 2 (two) times daily.      OneTouch Delica Lancets 63O MISC 1 each by Does not apply route 3 (three) times daily. Use to check blood glucose three times daily 100 each 3   sildenafil (VIAGRA) 100 MG tablet Take 100 mg by mouth daily as needed.     simvastatin (ZOCOR) 20 MG tablet Take 20 mg by mouth daily.     Vitamin D, Ergocalciferol, (DRISDOL) 1.25 MG (50000  UNIT) CAPS capsule Take 1  capsule (50,000 Units total) by mouth every 7 (seven) days. 4 capsule 0   No current facility-administered medications for this visit.     Objective: Vital signs in last 24 hours: BP 125/62   Pulse 82   Intake/Output from previous day: No intake/output data recorded. Intake/Output this shift: _0 @   Physical Exam  Lab Results:  Recent Results (from the past 2160 hour(s))  Urinalysis, Routine w reflex microscopic     Status: Abnormal   Collection Time: 10/23/21 11:33 AM  Result Value Ref Range   Specific Gravity, UA 1.020 1.005 - 1.030   pH, UA 6.0 5.0 - 7.5   Color, UA Yellow Yellow   Appearance Ur Clear Clear   Leukocytes,UA Negative Negative   Protein,UA Trace (A) Negative/Trace   Glucose, UA Negative Negative   Ketones, UA Negative Negative   RBC, UA Trace (A) Negative   Bilirubin, UA Negative Negative   Urobilinogen, Ur 0.2 0.2 - 1.0 mg/dL   Nitrite, UA Negative Negative   Microscopic Examination See below:   Microscopic Examination     Status: None   Collection Time: 10/23/21 11:33 AM   Urine  Result Value Ref Range   WBC, UA None seen 0 - 5 /hpf   RBC, Urine 0-2 0 - 2 /hpf   Epithelial Cells (non renal) 0-10 0 - 10 /hpf   Renal Epithel, UA None seen None seen /hpf   Mucus, UA Present Not Estab.   Bacteria, UA None seen None seen/Few  VITAMIN D 25 Hydroxy (Vit-D Deficiency, Fractures)     Status: None   Collection Time: 12/01/21 11:34 AM  Result Value Ref Range   Vit D, 25-Hydroxy 45.3 30.0 - 100.0 ng/mL    Comment: Vitamin D deficiency has been defined by the [place] practice guideline as a level of serum 25-OH vitamin D less than 20 ng/mL (1,2). The Endocrine Society went on to further define vitamin D insufficiency as a level between 21 and 29 ng/mL (2). 1. IOM (Institute of Medicine). 2010. Dietary reference    intakes for calcium and D. [place]: The    [employer]. 2. Holick MF, Binkley [place], Bischoff-Ferrari HA, et al.    Evaluation, treatment, and prevention of vitamin D    deficiency: an Endocrine Society clinical practice    guideline. JCEM. 2011 Jul; 96(7):1911-30.   TSH     Status: None   Collection Time: 01/01/22 11:04 AM  Result Value Ref Range   TSH 4.210 0.450 - 4.500 uIU/mL  T4, free     Status: None   Collection Time: 01/01/22 11:04 AM  Result Value Ref Range   Free T4 1.03 0.82 - 1.77 ng/dL  HgB A1c     Status: Abnormal   Collection Time: 01/07/22 10:55 AM  Result Value Ref Range   Hemoglobin A1C     HbA1c POC (<> result, manual entry) 8.6 4.0 - 5.6 %   HbA1c, POC (prediabetic range)     HbA1c, POC (controlled diabetic range)    PSA, total and free     Status: Abnormal   Collection Time: 01/14/22 12:06 PM  Result Value Ref Range   Prostate Specific Ag, Serum 4.9 (H) 0.0 - 4.0 ng/mL    Comment: Roche ECLIA methodology. According to the American Urological Association, Serum PSA should decrease and remain at undetectable levels after radical prostatectomy. The AUA defines biochemical recurrence as an initial PSA value 0.2 ng/mL or greater  followed by a subsequent confirmatory PSA value 0.2 ng/mL or greater. Values obtained with different assay methods or kits cannot be used interchangeably. Results cannot be interpreted as absolute evidence of the presence or absence of malignant disease.    PSA, Free 0.94 N/A ng/mL    Comment: Roche ECLIA methodology.   PSA, Free Pct 19.2 %    Comment: The table below lists the probability of prostate cancer for men with non-suspicious DRE results and total PSA between 4 and 10 ng/mL, by patient age Ricci Barker, [place], [id number]).                   % Free PSA       50-64 yr        65-75 yr                   0.00-10.00%        56%             55%                  10.01-15.00%        24%             35%                  15.01-20.00%        17%             23%                   20.01-25.00%        10%             20%                       >25.00%         5%              9% Please note:  [place]ona et al did not make specific               recommendations regarding the use of               percent free PSA for any other population               of men.   Urinalysis, Routine w reflex microscopic     Status: Abnormal   Collection Time: 01/15/22 11:18 AM  Result Value Ref Range   Specific Gravity, UA 1.015 1.005 - 1.030   pH, UA 7.0 5.0 - 7.5   Color, UA Amber (A) Yellow   Appearance Ur Clear Clear   Leukocytes,UA Negative Negative   Protein,UA 1+ (A) Negative/Trace   Glucose, UA Negative Negative   Ketones, UA Negative Negative   RBC, UA Negative Negative   Bilirubin, UA Negative Negative   Urobilinogen, Ur 1.0 0.2 - 1.0 mg/dL   Nitrite, UA Negative Negative   Microscopic Examination See below:   Microscopic Examination     Status: None   Collection Time: 01/15/22 11:18 AM   Urine  Result Value Ref Range   WBC, UA None seen 0 - 5 /hpf   RBC, Urine None seen 0 - 2 /hpf   Epithelial Cells (non renal) None seen 0 - 10 /hpf   Renal Epithel, UA None seen None seen /hpf   Mucus, UA Present Not Estab.   Bacteria, UA None seen None seen/Few   UA  is clear.  Studies/Results: No results found.   Assessment/Plan: Elevated PSA.  His PSA is down slightly.  I will have him return in 6 months for a repeat.   BPH with BOO.  He only has mild LUTS.   History of UTI/Prostatitis.  UA is clear.   ED.  He responds to sildenafil and has done better on an antiacid which has reduced the associated heartburn.      No orders of the defined types were placed in this encounter.    Orders Placed This Encounter  Procedures   Microscopic Examination   Urinalysis, Routine w reflex microscopic   PSA, total and free    Standing Status:   Future    Standing Expiration Date:   01/16/2023     Return in about 6 months (around 07/18/2022) for with PSA.    CC: Dr. Rosita Fire.      Irine Seal 01/15/2022 [phone] ID: [name], male   DOB: 1946-04-15, 76 y.o.   MRN: [id number]

## 2022-01-16 ENCOUNTER — Other Ambulatory Visit: Payer: Medicare HMO

## 2022-01-23 ENCOUNTER — Other Ambulatory Visit (INDEPENDENT_AMBULATORY_CARE_PROVIDER_SITE_OTHER): Payer: Self-pay | Admitting: Bariatrics

## 2022-01-23 DIAGNOSIS — E559 Vitamin D deficiency, unspecified: Secondary | ICD-10-CM

## 2022-01-26 ENCOUNTER — Other Ambulatory Visit: Payer: Self-pay | Admitting: Nurse Practitioner

## 2022-02-02 ENCOUNTER — Ambulatory Visit (INDEPENDENT_AMBULATORY_CARE_PROVIDER_SITE_OTHER): Payer: Medicare HMO | Admitting: Bariatrics

## 2022-02-04 ENCOUNTER — Encounter (INDEPENDENT_AMBULATORY_CARE_PROVIDER_SITE_OTHER): Payer: Self-pay

## 2022-02-13 ENCOUNTER — Other Ambulatory Visit: Payer: Medicare HMO

## 2022-03-04 ENCOUNTER — Ambulatory Visit (INDEPENDENT_AMBULATORY_CARE_PROVIDER_SITE_OTHER): Payer: Medicare HMO | Admitting: Bariatrics

## 2022-03-04 ENCOUNTER — Encounter (INDEPENDENT_AMBULATORY_CARE_PROVIDER_SITE_OTHER): Payer: Self-pay | Admitting: Bariatrics

## 2022-03-04 VITALS — BP 147/67 | HR 57 | Temp 97.4°F | Ht 66.0 in | Wt 253.0 lb

## 2022-03-04 DIAGNOSIS — E559 Vitamin D deficiency, unspecified: Secondary | ICD-10-CM

## 2022-03-04 DIAGNOSIS — Z6841 Body Mass Index (BMI) 40.0 and over, adult: Secondary | ICD-10-CM

## 2022-03-04 DIAGNOSIS — F5089 Other specified eating disorder: Secondary | ICD-10-CM | POA: Diagnosis not present

## 2022-03-04 DIAGNOSIS — E669 Obesity, unspecified: Secondary | ICD-10-CM

## 2022-03-04 MED ORDER — VITAMIN D (ERGOCALCIFEROL) 1.25 MG (50000 UNIT) PO CAPS: 50000.0000 [IU] | ORAL_CAPSULE | ORAL | 0 refills | Status: DC

## 2022-03-04 MED ORDER — TOPIRAMATE 50 MG PO TABS: 50.0000 mg | ORAL_TABLET | Freq: Every day | ORAL | 0 refills | Status: DC

## 2022-03-04 NOTE — Telephone Encounter (Signed)
Mr. Mercer would like a call from Dr. Owens Shark. Thank you!

## 2022-03-09 ENCOUNTER — Telehealth: Payer: Self-pay

## 2022-03-09 NOTE — Telephone Encounter (Signed)
Patient stated that his wife Joycelyn Schmid) needed information but will wait until her follow-up visit with a different provider due to Dr. Owens Shark moving. Verbalized understanding.

## 2022-03-12 NOTE — Progress Notes (Unsigned)
Chief Complaint:   OBESITY Gerald Evans is here to discuss his progress with his obesity treatment plan along with follow-up of his obesity related diagnoses. Gerald Evans is on the Category 2 Plan and states he is following his eating plan approximately 60% of the time. Gerald Evans states he is at the gym for 45 minutes 2 times per week.  Today's visit was #: 14 Starting weight: 242 lbs Starting date: 03/04/2021 Today's weight: 253 lbs Today's date: 03/04/2022 Total lbs lost to date: 0 Total lbs lost since last in-office visit: 0  Interim History: Gerald Evans has been having a hard time. He is not eating late, but she is snacking too much.   Subjective:   1. Vitamin D insufficiency Gerald Evans is taking Vitamin D as directed.   2. Other disorder of eating Gerald Evans is eating in the evening and after dinner. He denies a history of kidney stones.   Assessment/Plan:   1. Vitamin D insufficiency We will refill prescription Vitamin D 50,000 IU every week for 1 month. Gerald Evans will follow-up for routine testing of Vitamin D, at least 2-3 times per year to avoid over-replacement.  - Vitamin D, Ergocalciferol, (DRISDOL) 1.25 MG (50000 UNIT) CAPS capsule; Take 1 capsule (50,000 Units total) by mouth every 7 (seven) days.  Dispense: 4 capsule; Refill: 0  2. Other disorder of eating Gerald Evans agreed to start Topamax 50 mg once daily with supper, with no refills.   - topiramate (TOPAMAX) 50 MG tablet; Take 1 tablet (50 mg total) by mouth daily before supper.  Dispense: 30 tablet; Refill: 0  3. Obesity, Current BMI 40.9 Gerald Evans is currently in the action stage of change. As such, his goal is to continue with weight loss efforts. He has agreed to the Category 2 Plan.   Meal planning was discussed. He will decrease his snacking at night.  Exercise goals: As is.   Behavioral modification strategies: increasing lean protein intake, decreasing simple carbohydrates, increasing vegetables, increasing water intake,  decreasing eating out, no skipping meals, meal planning and cooking strategies, keeping healthy foods in the home, and planning for success.  Gerald Evans has agreed to follow-up with our clinic in 4 weeks. He was informed of the importance of frequent follow-up visits to maximize his success with intensive lifestyle modifications for his multiple health conditions.   Objective:   Blood pressure (!) 147/67, pulse (!) 57, temperature (!) 97.4 F (36.3 C), height 5' 6"  (1.676 m), weight 253 lb (114.8 kg), SpO2 96 %. Body mass index is 40.84 kg/m.  General: Cooperative, alert, well developed, in no acute distress. HEENT: Conjunctivae and lids unremarkable. Cardiovascular: Regular rhythm.  Lungs: Normal work of breathing. Neurologic: No focal deficits.   Lab Results  Component Value Date   CREATININE 1.20 07/15/2021   BUN 13 07/15/2021   NA 141 07/15/2021   K 3.8 07/15/2021   CL 105 07/15/2021   CO2 22 07/15/2021   Lab Results  Component Value Date   ALT 16 07/15/2021   AST 17 07/15/2021   ALKPHOS 93 07/15/2021   BILITOT 0.2 07/15/2021   Lab Results  Component Value Date   HGBA1C 8.6 01/07/2022   HGBA1C 9.7 (A) 10/08/2021   HGBA1C 10.1 07/16/2021   HGBA1C 9.7 (A) 04/15/2021   HGBA1C 9.3 (A) 01/13/2021   No results found for: "INSULIN" Lab Results  Component Value Date   TSH 4.210 01/01/2022   Lab Results  Component Value Date   CHOL 140 03/04/2021   HDL 42 03/04/2021  LDLCALC 77 03/04/2021   TRIG 118 03/04/2021   CHOLHDL 3.1 03/18/2020   Lab Results  Component Value Date   VD25OH 45.3 12/01/2021   VD25OH 34.1 03/04/2021   Lab Results  Component Value Date   WBC 13.6 (H) 08/21/2020   HGB 11.8 (L) 08/21/2020   HCT 35.7 (L) 08/21/2020   MCV 73.6 (L) 08/21/2020   PLT 198 08/21/2020   Lab Results  Component Value Date   IRON 68 05/25/2016   TIBC 279 05/25/2016   FERRITIN 187 05/25/2016   Attestation Statements:   Reviewed by clinician on day of visit:  allergies, medications, problem list, medical history, surgical history, family history, social history, and previous encounter notes.   Wilhemena Durie, am acting as Location manager for [employer], DO.  I have reviewed the above documentation for accuracy and completeness, and I agree with the above. Jearld Lesch, DO

## 2022-03-16 ENCOUNTER — Encounter (INDEPENDENT_AMBULATORY_CARE_PROVIDER_SITE_OTHER): Payer: Self-pay | Admitting: Bariatrics

## 2022-03-22 ENCOUNTER — Other Ambulatory Visit (INDEPENDENT_AMBULATORY_CARE_PROVIDER_SITE_OTHER): Payer: Self-pay | Admitting: Bariatrics

## 2022-03-22 DIAGNOSIS — E559 Vitamin D deficiency, unspecified: Secondary | ICD-10-CM

## 2022-04-01 ENCOUNTER — Ambulatory Visit (INDEPENDENT_AMBULATORY_CARE_PROVIDER_SITE_OTHER): Payer: Medicare HMO | Admitting: Adult Health

## 2022-04-01 ENCOUNTER — Encounter (INDEPENDENT_AMBULATORY_CARE_PROVIDER_SITE_OTHER): Payer: Self-pay | Admitting: Adult Health

## 2022-04-01 VITALS — BP 150/73 | HR 55 | Temp 97.5°F | Ht 66.0 in | Wt 253.0 lb

## 2022-04-01 DIAGNOSIS — E669 Obesity, unspecified: Secondary | ICD-10-CM

## 2022-04-01 DIAGNOSIS — Z794 Long term (current) use of insulin: Secondary | ICD-10-CM

## 2022-04-01 DIAGNOSIS — E1169 Type 2 diabetes mellitus with other specified complication: Secondary | ICD-10-CM | POA: Diagnosis not present

## 2022-04-01 DIAGNOSIS — Z7984 Long term (current) use of oral hypoglycemic drugs: Secondary | ICD-10-CM

## 2022-04-01 DIAGNOSIS — E559 Vitamin D deficiency, unspecified: Secondary | ICD-10-CM

## 2022-04-01 DIAGNOSIS — Z6841 Body Mass Index (BMI) 40.0 and over, adult: Secondary | ICD-10-CM

## 2022-04-01 DIAGNOSIS — Z6836 Body mass index (BMI) 36.0-36.9, adult: Secondary | ICD-10-CM

## 2022-04-01 DIAGNOSIS — I10 Essential (primary) hypertension: Secondary | ICD-10-CM | POA: Diagnosis not present

## 2022-04-01 MED ORDER — VITAMIN D (ERGOCALCIFEROL) 1.25 MG (50000 UNIT) PO CAPS: 50000.0000 [IU] | ORAL_CAPSULE | ORAL | 0 refills | Status: DC

## 2022-04-08 NOTE — Progress Notes (Signed)
Chief Complaint:   OBESITY Dnaiel is here to discuss his progress with his obesity treatment plan along with follow-up of his obesity related diagnoses. Ahmad is on the Category 2 Plan and states he is following his eating plan approximately 60% of the time. Wynston states he is going to [employer] 45 minutes 3 times per week.  Today's visit was #: 15 Starting weight: 242 lbs Starting date: 03/04/2021 Today's weight: 253 lbs Today's date: 04/01/2022 Total lbs lost to date: 0 Total lbs lost since last in-office visit: 0  Interim History:  Mr. Ruane provided the following intake that is typical of a day: Breakfast:  Oatmeal with either eggs or bacon Lunch:  Meat protein sandwich Dinner:  Seafood or chicken with vegetables.   Subjective:   1. Essential hypertension 03/10/22 New Pt Appt with Cards OV Notes: History of Present Illness: Mr. Minton is a 76 y.o.male patient who presents to establish care. Pt is with wife. Reports swelling in feet, notes intermittent, onset over two weeks. Reports having been prescribed furosemide, it initially didn't work, was increased to 40 mg. Denies exercise. Reports allergies to lisinopril, notes itching, denies any problems with losartan. Reports h/o tobacco use, notes having quit 30 years ago. Reports brother had open heart surgery and died of a heart attack. Wife reports pt is diabetic.   2. Vitamin D insufficiency 12/21/21, Vitamin D level 45.3.  3. Type 2 diabetes mellitus with other specified complication, with long-term current use of insulin (HCC) Lab Results  Component Value Date   HGBA1C 8.6 01/07/2022   HGBA1C 9.7 (A) 10/08/2021   HGBA1C 10.1 07/16/2021    Not well controlled.   Endocrinology manages metformin 1000 mg daily.  Novolin 70/30 55 units BID if he is eating and blood glucose >90.  He held AM dose today due to blood glucose reading of 88 this morning.  Assessment/Plan:   1. Essential hypertension 03/10/22 Cards  OV Notes: Edema, recommend echo for further evaluation, discontinue amlodipine SOB, recommend echo for further evaluation to rule out ischemia CHF, recommend lexiscan and echo for further evaluation Essential HTN, pt is hypertensive today, BP is 142/68, start hydralazine 25 mg 3 times daily, continue losartan metoprolol Hyperlipidemia, continue statin for lipid management Obesity, recommend portion control, weight loss, and exercise Type 2 DM, management per PCP Have patient follow up in 2 months- 05/10/2022  2. Vitamin D insufficiency Refill - Vitamin D, Ergocalciferol, (DRISDOL) 1.25 MG (50000 UNIT) CAPS capsule; Take 1 capsule (50,000 Units total) by mouth every 7 (seven) days.  Dispense: 4 capsule; Refill: 0  3. Type 2 diabetes mellitus with other specified complication, with long-term current use of insulin (HCC) Continue Metformin and Novolin per Endocrinology.  4. Obesity, Current BMI 40.8 Josafat is currently in the action stage of change. As such, his goal is to continue with weight loss efforts. He has agreed to the Category 2 Plan.   Exercise goals:  As is.   Behavioral modification strategies: increasing lean protein intake, decreasing simple carbohydrates, meal planning and cooking strategies, keeping healthy foods in the home, and planning for success.  Cord has agreed to follow-up with our clinic in 3 weeks. He was informed of the importance of frequent follow-up visits to maximize his success with intensive lifestyle modifications for his multiple health conditions.   Objective:   Blood pressure (!) 155/73, pulse (!) 55, temperature (!) 97.5 F (36.4 C), height 5' 6"  (1.676 m), weight 253 lb (114.8 kg), SpO2  95 %. Body mass index is 40.84 kg/m.  General: Cooperative, alert, well developed, in no acute distress. HEENT: Conjunctivae and lids unremarkable. Cardiovascular: Regular rhythm.  Lungs: Normal work of breathing. Neurologic: No focal deficits.   Lab  Results  Component Value Date   CREATININE 1.20 07/15/2021   BUN 13 07/15/2021   NA 141 07/15/2021   K 3.8 07/15/2021   CL 105 07/15/2021   CO2 22 07/15/2021   Lab Results  Component Value Date   ALT 16 07/15/2021   AST 17 07/15/2021   ALKPHOS 93 07/15/2021   BILITOT 0.2 07/15/2021   Lab Results  Component Value Date   HGBA1C 8.6 01/07/2022   HGBA1C 9.7 (A) 10/08/2021   HGBA1C 10.1 07/16/2021   HGBA1C 9.7 (A) 04/15/2021   HGBA1C 9.3 (A) 01/13/2021   No results found for: "INSULIN" Lab Results  Component Value Date   TSH 4.210 01/01/2022   Lab Results  Component Value Date   CHOL 140 03/04/2021   HDL 42 03/04/2021   LDLCALC 77 03/04/2021   TRIG 118 03/04/2021   CHOLHDL 3.1 03/18/2020   Lab Results  Component Value Date   VD25OH 45.3 12/01/2021   VD25OH 34.1 03/04/2021   Lab Results  Component Value Date   WBC 13.6 (H) 08/21/2020   HGB 11.8 (L) 08/21/2020   HCT 35.7 (L) 08/21/2020   MCV 73.6 (L) 08/21/2020   PLT 198 08/21/2020   Lab Results  Component Value Date   IRON 68 05/25/2016   TIBC 279 05/25/2016   FERRITIN 187 05/25/2016    Obesity Behavioral Intervention:   Approximately 15 minutes were spent on the discussion below.  ASK: We discussed the diagnosis of obesity with Burton Apley today and Vishwa agreed to give [place] permission to discuss obesity behavioral modification therapy today.  ASSESS: Iva has the diagnosis of obesity and his BMI today is 40.8. Zackarey is in the action stage of change.   ADVISE: Mubarak was educated on the multiple health risks of obesity as well as the benefit of weight loss to improve his health. He was advised of the need for long term treatment and the importance of lifestyle modifications to improve his current health and to decrease his risk of future health problems.  AGREE: Multiple dietary modification options and treatment options were discussed and Cameo agreed to follow the recommendations documented in  the above note.  ARRANGE: Duey was educated on the importance of frequent visits to treat obesity as outlined per CMS and USPSTF guidelines and agreed to schedule his next follow up appointment today.  Attestation Statements:   Reviewed by clinician on day of visit: allergies, medications, problem list, medical history, surgical history, family history, social history, and previous encounter notes.  I, Davy Pique, RMA, am acting as Location manager for [name], NP.  I have reviewed the above documentation for accuracy and completeness, and I agree with the above. -  Reyna Lorenzi d. Jacoya Bauman, NP-C

## 2022-05-06 ENCOUNTER — Ambulatory Visit (INDEPENDENT_AMBULATORY_CARE_PROVIDER_SITE_OTHER): Payer: Medicare HMO | Admitting: Family Medicine

## 2022-05-06 ENCOUNTER — Encounter (INDEPENDENT_AMBULATORY_CARE_PROVIDER_SITE_OTHER): Payer: Self-pay | Admitting: Family Medicine

## 2022-05-06 VITALS — BP 162/74 | HR 59 | Temp 97.8°F | Ht 66.0 in | Wt 247.0 lb

## 2022-05-06 DIAGNOSIS — F5089 Other specified eating disorder: Secondary | ICD-10-CM | POA: Diagnosis not present

## 2022-05-06 DIAGNOSIS — E559 Vitamin D deficiency, unspecified: Secondary | ICD-10-CM | POA: Diagnosis not present

## 2022-05-06 DIAGNOSIS — E669 Obesity, unspecified: Secondary | ICD-10-CM | POA: Diagnosis not present

## 2022-05-06 DIAGNOSIS — Z6841 Body Mass Index (BMI) 40.0 and over, adult: Secondary | ICD-10-CM

## 2022-05-06 DIAGNOSIS — E1165 Type 2 diabetes mellitus with hyperglycemia: Secondary | ICD-10-CM

## 2022-05-06 DIAGNOSIS — Z794 Long term (current) use of insulin: Secondary | ICD-10-CM

## 2022-05-06 DIAGNOSIS — Z7985 Long-term (current) use of injectable non-insulin antidiabetic drugs: Secondary | ICD-10-CM

## 2022-05-06 MED ORDER — VITAMIN D (ERGOCALCIFEROL) 1.25 MG (50000 UNIT) PO CAPS: 50000.0000 [IU] | ORAL_CAPSULE | ORAL | 0 refills | Status: DC

## 2022-05-06 MED ORDER — OZEMPIC (0.25 OR 0.5 MG/DOSE) 2 MG/3ML ~~LOC~~ SOPN: 0.2500 mg | PEN_INJECTOR | SUBCUTANEOUS | 0 refills | Status: DC

## 2022-05-06 MED ORDER — BD PEN NEEDLE NANO 2ND GEN 32G X 4 MM MISC: 1.0000 | Freq: Two times a day (BID) | 0 refills | Status: AC

## 2022-05-06 MED ORDER — TOPIRAMATE 50 MG PO TABS: 50.0000 mg | ORAL_TABLET | Freq: Every day | ORAL | 0 refills | Status: DC

## 2022-05-08 LAB — LIPID PANEL
Chol/HDL Ratio: 3.4 ratio (ref 0.0–5.0)
Cholesterol, Total: 147 mg/dL (ref 100–199)
HDL: 43 mg/dL (ref >39)
LDL Chol Calc (NIH): 87 mg/dL (ref 0–99)
Triglycerides: 90 mg/dL (ref 0–149)
VLDL Cholesterol Cal: 17 mg/dL (ref 5–40)

## 2022-05-08 LAB — COMPREHENSIVE METABOLIC PANEL
ALT: 12 IU/L (ref 0–44)
AST: 19 IU/L (ref 0–40)
Albumin/Globulin Ratio: 1.8 (ref 1.2–2.2)
Albumin: 4.2 g/dL (ref 3.8–4.8)
Alkaline Phosphatase: 85 IU/L (ref 44–121)
BUN/Creatinine Ratio: 14 (ref 10–24)
BUN: 18 mg/dL (ref 8–27)
Bilirubin Total: 0.4 mg/dL (ref 0.0–1.2)
CO2: 20 mmol/L (ref 20–29)
Calcium: 9.5 mg/dL (ref 8.6–10.2)
Chloride: 106 mmol/L (ref 96–106)
Creatinine, Ser: 1.26 mg/dL (ref 0.76–1.27)
Globulin, Total: 2.4 g/dL (ref 1.5–4.5)
Glucose: 89 mg/dL (ref 70–99)
Potassium: 3.9 mmol/L (ref 3.5–5.2)
Sodium: 140 mmol/L (ref 134–144)
Total Protein: 6.6 g/dL (ref 6.0–8.5)
eGFR: 59 mL/min/{1.73_m2} — ABNORMAL LOW (ref >59)

## 2022-05-08 LAB — T4, FREE: Free T4: 1.15 ng/dL (ref 0.82–1.77)

## 2022-05-08 LAB — TSH: TSH: 2.22 u[IU]/mL (ref 0.450–4.500)

## 2022-05-11 ENCOUNTER — Encounter: Payer: Self-pay | Admitting: Nurse Practitioner

## 2022-05-11 ENCOUNTER — Ambulatory Visit (INDEPENDENT_AMBULATORY_CARE_PROVIDER_SITE_OTHER): Payer: Medicare HMO | Admitting: Nurse Practitioner

## 2022-05-11 ENCOUNTER — Encounter (INDEPENDENT_AMBULATORY_CARE_PROVIDER_SITE_OTHER): Payer: Self-pay | Admitting: Gastroenterology

## 2022-05-11 ENCOUNTER — Ambulatory Visit (INDEPENDENT_AMBULATORY_CARE_PROVIDER_SITE_OTHER): Payer: Medicare HMO | Admitting: Gastroenterology

## 2022-05-11 VITALS — BP 154/80 | HR 54 | Temp 97.5°F | Ht 66.0 in | Wt 251.1 lb

## 2022-05-11 VITALS — BP 130/70 | HR 59 | Ht 66.0 in | Wt 251.4 lb

## 2022-05-11 DIAGNOSIS — I10 Essential (primary) hypertension: Secondary | ICD-10-CM | POA: Diagnosis not present

## 2022-05-11 DIAGNOSIS — E782 Mixed hyperlipidemia: Secondary | ICD-10-CM | POA: Diagnosis not present

## 2022-05-11 DIAGNOSIS — E1165 Type 2 diabetes mellitus with hyperglycemia: Secondary | ICD-10-CM

## 2022-05-11 DIAGNOSIS — K760 Fatty (change of) liver, not elsewhere classified: Secondary | ICD-10-CM

## 2022-05-11 DIAGNOSIS — R0609 Other forms of dyspnea: Secondary | ICD-10-CM | POA: Insufficient documentation

## 2022-05-11 DIAGNOSIS — E039 Hypothyroidism, unspecified: Secondary | ICD-10-CM | POA: Diagnosis not present

## 2022-05-11 DIAGNOSIS — R0602 Shortness of breath: Secondary | ICD-10-CM | POA: Diagnosis not present

## 2022-05-11 DIAGNOSIS — K7581 Nonalcoholic steatohepatitis (NASH): Secondary | ICD-10-CM

## 2022-05-11 DIAGNOSIS — K7400 Hepatic fibrosis, unspecified: Secondary | ICD-10-CM | POA: Diagnosis not present

## 2022-05-11 LAB — POCT UA - MICROALBUMIN
Albumin/Creatinine Ratio, Urine, POC: <30
Creatinine, POC: 200 mg/dL
Microalbumin Ur, POC: 80 mg/L

## 2022-05-11 LAB — POCT GLYCOSYLATED HEMOGLOBIN (HGB A1C): Hemoglobin A1C: 7.8 % — AB (ref 4.0–5.6)

## 2022-05-11 MED ORDER — NOVOLIN 70/30 FLEXPEN (70-30) 100 UNIT/ML ~~LOC~~ SUPN: 50.0000 [IU] | PEN_INJECTOR | Freq: Two times a day (BID) | SUBCUTANEOUS | 3 refills | Status: DC

## 2022-05-11 MED ORDER — LEVOTHYROXINE SODIUM 50 MCG PO TABS: 50.0000 ug | ORAL_TABLET | Freq: Every day | ORAL | 1 refills | Status: DC

## 2022-05-11 MED ORDER — METFORMIN HCL 1000 MG PO TABS: 1000.0000 mg | ORAL_TABLET | Freq: Every day | ORAL | 3 refills | Status: DC

## 2022-05-11 NOTE — Progress Notes (Signed)
Gerald Evans, M.D. Gastroenterology & Hepatology [facility] [address] [place], [place] [address]  Primary Care Physician: Carrolyn Meiers, MD [address] [place] [address]  I will communicate my assessment and recommendations to the referring MD via EMR.  Problems: NASH with F3/F4 fibrosis Shortness of breath  History of Present Illness: Gerald Evans is a 76 y.o. male with past medical history NASH with F3/F4 fibrosis, diabetes, hyperlipidemia, hypertension, who presents for follow up of [place] fibrosis and shortness of breath.  The patient was last seen on 07/17/2019 (Dr. Laural Golden). At that time, the patient was being evaluated for NASH with F3/F4 fibrosis. Was advised to have a repeat elastography in a year and to increase activity.  Patient reports that for the last month he has presented some shortness of breath when he walks a flight of stairs. He has also presented some shortness of breath when bending down to tie his shoes. He reports he was feeling well prior to this.  Notably, he has been seen at [facility] for evaluation of congestive heart failure.  Last time seen in their clinic was on 04/02/2022. Had a LEXI scan on 03/26/2022 which was unremarkable.  The patient denies having any nausea, vomiting, fever, chills, hematochezia, melena, hematemesis, abdominal distention, abdominal pain, diarrhea, jaundice, pruritus. Has gained 11 lb since last appointment.  Most recent CT of the abdomen and pelvis without IV contrast on 08/21/2020 showed some mild perivesical inflammatory changes without any other intra-abdominal pathology.  Last Colonoscopy: 2019 - One small polyp in the transverse colon. Biopsied - path TA. - Diverticulosis in the sigmoid colon. - Internal hemorrhoids.  Recommended repeat in 7 years  Past Medical History: Past Medical History:  Diagnosis Date   Colon polyps    Diabetes mellitus without  complication ([place])    Edema of both lower extremities    Fatty liver    Hypercholesteremia    Hypertension     Past Surgical History: Past Surgical History:  Procedure Laterality Date   COLONOSCOPY     COLONOSCOPY  04/21/2012   Procedure: COLONOSCOPY;  Surgeon: Rogene Houston, MD;  Location: [facility];  Service: Endoscopy;  Laterality: N/A;  1200   COLONOSCOPY N/A 09/09/2017   Procedure: COLONOSCOPY;  Surgeon: Rogene Houston, MD;  Location: [facility];  Service: Endoscopy;  Laterality: N/A;  1200-rescheduled to 3/14/ _0 :00pm per [place]     POLYPECTOMY  09/09/2017   Procedure: POLYPECTOMY;  Surgeon: Rogene Houston, MD;  Location: [facility];  Service: Endoscopy;;  Transverse Colon (CB)    Family History: Family History  Problem Relation Age of Onset   Hypertension Mother    Hypertension Father    Alcoholism Father    Obesity Father    Diabetes Sister    Hypertension Sister    Healthy Daughter    Healthy Son    Colon cancer Neg Hx     Social History: Social History   Tobacco Use  Smoking Status Former   Packs/day: 0.50   Years: 20.00   Total pack years: 10.00   Types: Cigarettes  Smokeless Tobacco Never   Social History   Substance and Sexual Activity  Alcohol Use Yes   Alcohol/week: 4.0 standard drinks of alcohol   Types: 4 Standard drinks or equivalent per week   Social History   Substance and Sexual Activity  Drug Use No    Allergies: Allergies  Allergen Reactions  Lisinopril     cough    Medications: Current Outpatient Medications  Medication Sig Dispense Refill   blood glucose meter kit and supplies Dispense based on patient and insurance preference. Use up to four times daily as directed. (FOR ICD-10 E11.65) 1 each 0   furosemide (LASIX) 20 MG tablet Take 20 mg by mouth as needed for edema.     glucose blood (ONETOUCH ULTRA) test strip Use 1 strip to check glucose 3 times daily as directed. 300 each 1    hydrochlorothiazide (HYDRODIURIL) 25 MG tablet Take 25 mg by mouth daily.     Insulin Pen Needle (BD PEN NEEDLE NANO 2ND GEN) 32G X 4 MM MISC 1 Package by Does not apply route 2 (two) times daily. 100 each 0   levothyroxine (SYNTHROID) 50 MCG tablet Take 1 tablet (50 mcg total) by mouth daily before breakfast. 90 tablet 1   losartan (COZAAR) 100 MG tablet Take 100 mg by mouth daily.     metFORMIN (GLUCOPHAGE) 1000 MG tablet Take 1 tablet (1,000 mg total) by mouth daily with breakfast. 90 tablet 3   metoprolol (LOPRESSOR) 100 MG tablet Take 100 mg by mouth 2 (two) times daily.      NOVOLIN 70/30 KWIKPEN (70-30) 100 UNIT/ML KwikPen Inject 50 Units into the skin 2 (two) times daily before a meal. 90 mL 3   OneTouch Delica Lancets 83J MISC 1 each by Does not apply route 3 (three) times daily. Use to check blood glucose three times daily 100 each 3   Semaglutide,0.25 or 0.5MG/DOS, (OZEMPIC, 0.25 OR 0.5 MG/DOSE,) 2 MG/3ML SOPN Inject 0.25 mg into the skin once a week. 3 mL 0   sildenafil (VIAGRA) 100 MG tablet Take 100 mg by mouth daily as needed.     simvastatin (ZOCOR) 20 MG tablet Take 20 mg by mouth daily.     Vitamin D, Ergocalciferol, (DRISDOL) 1.25 MG (50000 UNIT) CAPS capsule Take 1 capsule (50,000 Units total) by mouth every 7 (seven) days. 4 capsule 0   cloNIDine (CATAPRES) 0.2 MG tablet Take 1 tablet by mouth twice daily (Patient not taking: Reported on 05/11/2022) 180 tablet 0   topiramate (TOPAMAX) 50 MG tablet Take 1 tablet (50 mg total) by mouth daily before supper. (Patient not taking: Reported on 05/11/2022) 30 tablet 0   No current facility-administered medications for this visit.    Review of Systems: GENERAL: negative for malaise, night sweats HEENT: No changes in hearing or vision, no nose bleeds or other nasal problems. NECK: Negative for lumps, goiter, pain and significant neck swelling RESPIRATORY: Negative for cough, wheezing CARDIOVASCULAR: Negative for chest pain, leg  swelling, palpitations, orthopnea GI: SEE HPI MUSCULOSKELETAL: Negative for joint pain or swelling, back pain, and muscle pain. SKIN: Negative for lesions, rash PSYCH: Negative for sleep disturbance, mood disorder and recent psychosocial stressors. HEMATOLOGY Negative for prolonged bleeding, bruising easily, and swollen nodes. ENDOCRINE: Negative for cold or heat intolerance, polyuria, polydipsia and goiter. NEURO: negative for tremor, gait imbalance, syncope and seizures. The remainder of the review of systems is noncontributory.   Physical Exam: BP (!) 154/80 (BP Location: Left Arm, Patient Position: Sitting, Cuff Size: Large)   Pulse (!) 54   Temp (!) 97.5 F (36.4 C) (Temporal)   Ht _0  (1.676 m)   Wt 251 lb 1.6 oz (113.9 kg)   BMI 40.53 kg/m  GENERAL: The patient is AO x3, in no acute distress. Obese. HEENT: Head is normocephalic and atraumatic. EOMI are intact.  Mouth is well hydrated and without lesions. NECK: Supple. No masses LUNGS: Clear to auscultation. No presence of rhonchi/wheezing/rales.  Decreased chest expansion. HEART: RRR, normal s1 and s2. ABDOMEN: Soft, nontender, no guarding, no peritoneal signs, and nondistended. BS +. No masses. EXTREMITIES: Without any cyanosis, clubbing, rash, lesions or edema. NEUROLOGIC: AOx3, no focal motor deficit. SKIN: no jaundice, no rashes  Imaging/Labs: as above  I personally reviewed and interpreted the available labs, imaging and endoscopic files.  Impression and Plan: Gerald Evans is a 76 y.o. male with past medical history NASH with F3/F4 fibrosis, diabetes, hyperlipidemia, hypertension, who presents for follow up of [place] fibrosis and shortness of breath.  The patient has presented new onset of shortness of breath of unclear etiology.  He does not have any ascites at the moment that could explain his shortness of breath.  He has been evaluated recently by cardiology and was not found to have significant cardiac pathology.   I advised the patient to follow-up with his cardiologist but he will also be referred to pulmonology for further evaluation of his shortness of breath.  In terms of his NASH, he had presence of advanced fibrosis on most recent scan.  He has gained some weight which could go along with progression of his NASH fibrosis.  At this point, we will repeat elastography but we will complement his evaluation with fibrosis serology.  The patient was found to have elevated blood pressure when vital signs were checked in the office. The blood pressure was rechecked by the nursing staff and it was found be persistently elevated >140/90 mmHg. I personally advised to the patient to follow up closely with his PCP for hypertension control.  - Schedule liver elastography - Check NASH fibrosure - Pulmonology referral - Pt to reach cardiologist at [facility] to discuss shortness of breath symptoms  All questions were answered.      Gerald Peppers, MD Gastroenterology and Hepatology [facility]

## 2022-05-11 NOTE — Progress Notes (Signed)
05/11/2022, 11:08 AM  Endocrinology follow-up note   Subjective:    Patient ID: Gerald Evans, male    DOB: 03/10/1946.  Gerald Evans is being seen in follow-up after he was seen in consultation for management of currently uncontrolled symptomatic diabetes requested by  Carrolyn Meiers, MD.   Past Medical History:  Diagnosis Date   Colon polyps    Diabetes mellitus without complication ([place])    Edema of both lower extremities    Fatty liver    Hypercholesteremia    Hypertension     Past Surgical History:  Procedure Laterality Date   COLONOSCOPY     COLONOSCOPY  04/21/2012   Procedure: COLONOSCOPY;  Surgeon: Rogene Houston, MD;  [facility]: [facility];  Service: Endoscopy;  Laterality: N/A;  1200   COLONOSCOPY N/A 09/09/2017   Procedure: COLONOSCOPY;  Surgeon: Rogene Houston, MD;  Location: [facility];  Service: Endoscopy;  Laterality: N/A;  1200-rescheduled to 3/14/ _0 :00pm per [place]     POLYPECTOMY  09/09/2017   Procedure: POLYPECTOMY;  Surgeon: Rogene Houston, MD;  Location: [facility];  Service: Endoscopy;;  Transverse Colon (CB)    Social History   Socioeconomic History   Marital status: Married    Spouse name: Not on file   Number of children: 2   Years of education: Not on file   Highest education level: Not on file  Occupational History   Occupation: retired  Tobacco Use   Smoking status: Former    Packs/day: 0.50    Years: 20.00    Total pack years: 10.00    Types: Cigarettes   Smokeless tobacco: Never  Vaping Use   Vaping Use: Never used  Substance and Sexual Activity   Alcohol use: Yes    Alcohol/week: 4.0 standard drinks of alcohol    Types: 4 Standard drinks or equivalent per week   Drug use: No   Sexual activity: Not on file  Other Topics Concern   Not on file  Social History Narrative   Not on file   Social Determinants of  Health   Financial Resource Strain: Not on file  Food Insecurity: Not on file  Transportation Needs: Not on file  Physical Activity: Not on file  Stress: Not on file  Social Connections: Not on file    Family History  Problem Relation Age of Onset   Hypertension Mother    Hypertension Father    Alcoholism Father    Obesity Father    Diabetes Sister    Hypertension Sister    Healthy Daughter    Healthy Son    Colon cancer Neg Hx     Outpatient Encounter Medications as of 05/11/2022  Medication Sig   blood glucose meter kit and supplies Dispense based on patient and insurance preference. Use up to four times daily as directed. (FOR ICD-10 E11.65)   cloNIDine (CATAPRES) 0.2 MG tablet Take 1 tablet by mouth twice daily   furosemide (LASIX) 20 MG tablet Take 20 mg by mouth daily.   glucose blood (ONETOUCH ULTRA) test strip Use 1 strip to check glucose  3 times daily as directed.   hydrochlorothiazide (HYDRODIURIL) 25 MG tablet Take 25 mg by mouth daily.   Insulin Pen Needle (BD PEN NEEDLE NANO 2ND GEN) 32G X 4 MM MISC 1 Package by Does not apply route 2 (two) times daily.   losartan (COZAAR) 100 MG tablet Take 100 mg by mouth daily.   metoprolol (LOPRESSOR) 100 MG tablet Take 100 mg by mouth 2 (two) times daily.    OneTouch Delica Lancets 56L MISC 1 each by Does not apply route 3 (three) times daily. Use to check blood glucose three times daily   Semaglutide,0.25 or 0.5MG/DOS, (OZEMPIC, 0.25 OR 0.5 MG/DOSE,) 2 MG/3ML SOPN Inject 0.25 mg into the skin once a week.   sildenafil (VIAGRA) 100 MG tablet Take 100 mg by mouth daily as needed.   simvastatin (ZOCOR) 20 MG tablet Take 20 mg by mouth daily.   topiramate (TOPAMAX) 50 MG tablet Take 1 tablet (50 mg total) by mouth daily before supper.   Vitamin D, Ergocalciferol, (DRISDOL) 1.25 MG (50000 UNIT) CAPS capsule Take 1 capsule (50,000 Units total) by mouth every 7 (seven) days.   [DISCONTINUED] levothyroxine (SYNTHROID) 50 MCG tablet  Take 1 tablet (50 mcg total) by mouth daily before breakfast.   [DISCONTINUED] metFORMIN (GLUCOPHAGE) 1000 MG tablet Take 1,000 mg by mouth daily with breakfast.   [DISCONTINUED] NOVOLIN 70/30 KWIKPEN (70-30) 100 UNIT/ML KwikPen INJECT 55 UNITS SUBCUTANEOUSLY TWICE DAILY WITH  A  MEAL  IF  GLUCOSE  IS  ABOVE  90  AND  HE  IS  EATING   levothyroxine (SYNTHROID) 50 MCG tablet Take 1 tablet (50 mcg total) by mouth daily before breakfast.   metFORMIN (GLUCOPHAGE) 1000 MG tablet Take 1 tablet (1,000 mg total) by mouth daily with breakfast.   NOVOLIN 70/30 KWIKPEN (70-30) 100 UNIT/ML KwikPen Inject 50 Units into the skin 2 (two) times daily before a meal.   No facility-administered encounter medications on file as of 05/11/2022.    ALLERGIES: Allergies  Allergen Reactions   Lisinopril     cough    VACCINATION STATUS: Immunization History  Administered Date(s) Administered   PFIZER Comirnaty(Gray Top)Covid-19 Tri-Sucrose Vaccine 11/12/2020   PFIZER(Purple Top)SARS-COV-2 Vaccination 08/20/2019, 09/12/2019, 04/25/2020   Pfizer Covid-19 Vaccine Bivalent Booster 51yr & up 07/17/2021    Diabetes He presents for his follow-up diabetic visit. He has type 2 diabetes mellitus. Onset time: He was diagnosed at approximate age of 89years. His disease course has been improving. Hypoglycemia symptoms include dizziness, hunger, sweats and tremors. Pertinent negatives for hypoglycemia include no confusion, headaches, nervousness/anxiousness, pallor or seizures. Associated symptoms include fatigue. Pertinent negatives for diabetes include no chest pain, no polydipsia, no polyphagia, no polyuria, no weakness and no weight loss. There are no hypoglycemic complications. Symptoms are stable. Diabetic complications include heart disease, impotence and nephropathy. Risk factors for coronary artery disease include dyslipidemia, diabetes mellitus, family history, tobacco exposure, sedentary lifestyle, male sex, obesity  and hypertension. Current diabetic treatment includes oral agent (monotherapy) and insulin injections (PCP recently started him on Ozempic but he could not afford it). He is compliant with treatment most of the time. His weight is fluctuating minimally. He is following a generally unhealthy diet. When asked about meal planning, he reported none. He has not had a previous visit with a dietitian. He participates in exercise three times a week. His home blood glucose trend is decreasing steadily. His overall blood glucose range is 110-130 mg/dl. (He presents today with his meter, no logs,  showing improved glycemic profile.  His POCT A1c today is 7.8%, improving from last visit of 8.6%.  He did drop his insulin dose to 50 units BID due to drops in glucose.  Analysis of his meter shows 7-day average of 123, 14-day average of 133, 30-day average of 132.  His PCP sent in script for Ozempic for him but he did not pick it up due to cost.  He said he will be able to try it once the new year begins as he will be out of the donut hole.) An ACE inhibitor/angiotensin II receptor blocker is being taken. He does not see a podiatrist.Eye exam is current.  Hyperlipidemia This is a chronic problem. The current episode started more than 1 year ago. The problem is controlled. Recent lipid tests were reviewed and are normal. Exacerbating diseases include chronic renal disease, diabetes and obesity. Factors aggravating his hyperlipidemia include fatty foods, beta blockers and thiazides. Pertinent negatives include no chest pain, myalgias or shortness of breath. Current antihyperlipidemic treatment includes statins. The current treatment provides mild improvement of lipids. Compliance problems include adherence to diet and adherence to exercise.  Risk factors for coronary artery disease include diabetes mellitus, dyslipidemia, hypertension, male sex, obesity, a sedentary lifestyle and family history.  Hypertension This is a chronic  problem. The current episode started more than 1 year ago. The problem has been waxing and waning since onset. The problem is uncontrolled. Associated symptoms include sweats. Pertinent negatives include no chest pain, headaches, neck pain, palpitations or shortness of breath. There are no associated agents to hypertension. Risk factors for coronary artery disease include dyslipidemia, diabetes mellitus, male gender, obesity and smoking/tobacco exposure. Past treatments include angiotensin blockers, diuretics, beta blockers and central alpha agonists. The current treatment provides mild improvement. There are no compliance problems.  Hypertensive end-organ damage includes kidney disease and CAD/MI. Identifiable causes of hypertension include chronic renal disease and a thyroid problem.  Thyroid Problem Presents for initial visit. Symptoms include fatigue, tremors and weight gain. Patient reports no anxiety, cold intolerance, constipation, depressed mood, hair loss, leg swelling, palpitations or weight loss. The symptoms have been stable. Past treatments include nothing. His past medical history is significant for diabetes, hyperlipidemia and obesity. There are no known risk factors.    Review of systems  Constitutional: + stable body weight,  current Body mass index is 40.58 kg/m. , no fatigue, no subjective hyperthermia, no subjective hypothermia Eyes: no blurry vision, no xerophthalmia ENT: no sore throat, no nodules palpated in throat, no dysphagia/odynophagia, no hoarseness Cardiovascular: no chest pain, no shortness of breath, no palpitations, no leg swelling Respiratory: no cough, no shortness of breath Gastrointestinal: no nausea/vomiting/diarrhea Musculoskeletal: no muscle/joint aches Skin: no rashes, no hyperemia Neurological: no tremors, no numbness, no tingling, no dizziness Psychiatric: no depression, no anxiety  Objective:    BP 130/70 (BP Location: Left Arm, Patient Position:  Sitting, Cuff Size: Large)   Pulse (!) 59   Ht _0  (1.676 m)   Wt 251 lb 6.4 oz (114 kg)   BMI 40.58 kg/m   Wt Readings from Last 3 Encounters:  05/11/22 251 lb 6.4 oz (114 kg)  05/06/22 247 lb (112 kg)  04/01/22 253 lb (114.8 kg)    BP Readings from Last 3 Encounters:  05/11/22 130/70  05/06/22 (!) 162/74  04/01/22 (!) 150/73    Physical Exam- Limited  Constitutional:  Body mass index is 40.58 kg/m. , not in acute distress, normal state of mind Eyes:  EOMI, no exophthalmos Neck: Supple Cardiovascular: RRR, no murmurs, rubs, or gallops, no edema Respiratory: Adequate breathing efforts, no crackles, rales, rhonchi, or wheezing Musculoskeletal: no gross deformities, strength intact in all four extremities, no gross restriction of joint movements Skin:  no rashes, no hyperemia Neurological: no tremor with outstretched hands  Diabetic Foot Exam - Simple   Simple Foot Form Visual Inspection No deformities, no ulcerations, no other skin breakdown bilaterally: Yes Sensation Testing Intact to touch and monofilament testing bilaterally: Yes Pulse Check Posterior Tibialis and Dorsalis pulse intact bilaterally: Yes Comments Dry flaky skin bilaterally     CMP ( most recent) CMP     Component Value Date/Time   NA 140 05/07/2022 1053   K 3.9 05/07/2022 1053   CL 106 05/07/2022 1053   CO2 20 05/07/2022 1053   GLUCOSE 89 05/07/2022 1053   GLUCOSE 234 (H) 08/21/2020 1437   BUN 18 05/07/2022 1053   CREATININE 1.26 05/07/2022 1053   CREATININE 1.00 03/18/2020 1321   CALCIUM 9.5 05/07/2022 1053   PROT 6.6 05/07/2022 1053   ALBUMIN 4.2 05/07/2022 1053   AST 19 05/07/2022 1053   ALT 12 05/07/2022 1053   ALKPHOS 85 05/07/2022 1053   BILITOT 0.4 05/07/2022 1053   GFRNONAA >60 08/21/2020 1437   GFRNONAA 74 03/18/2020 1321   GFRAA 67 07/17/2020 0953   GFRAA 86 03/18/2020 1321    Diabetic Labs (most recent): Lab Results  Component Value Date   HGBA1C 7.8 (A) 05/11/2022    HGBA1C 8.6 01/07/2022   HGBA1C 9.7 (A) 10/08/2021   MICROALBUR 80 mg/l 05/11/2022   MICROALBUR 150 04/15/2021   MICROALBUR 80 03/20/2020     Assessment & Plan:   1) Uncontrolled type 2 diabetes mellitus with hyperglycemia ([place])  - Gerald Evans has currently uncontrolled symptomatic type 2 DM since  76 years of age.  He presents today with his meter, no logs, showing improved glycemic profile.  His POCT A1c today is 7.8%, improving from last visit of 8.6%.  He did drop his insulin dose to 50 units BID due to drops in glucose.  Analysis of his meter shows 7-day average of 123, 14-day average of 133, 30-day average of 132.  His PCP sent in script for Ozempic for him but he did not pick it up due to cost.  He said he will be able to try it once the new year begins as he will be out of the donut hole.   Recent labs reviewed.   - I had a long discussion with him about the progressive nature of diabetes and the pathology behind its complications.  -his diabetes is complicated by obesity/sedentary life and he remains at a high risk for more acute and chronic complications which include CAD, CVA, CKD, retinopathy, and neuropathy. These are all discussed in detail with him.  - Nutritional counseling repeated at each appointment due to patients tendency to fall back in to old habits.  - The patient admits there is a room for improvement in their diet and drink choices. -  Suggestion is made for the patient to avoid simple carbohydrates from their diet including Cakes, Sweet Desserts / Pastries, Ice Cream, Soda (diet and regular), Sweet Tea, Candies, Chips, Cookies, Sweet Pastries, Store Bought Juices, Alcohol in Excess of 1-2 drinks a day, Artificial Sweeteners, Coffee Creamer, and "Sugar-free" Products. This will help patient to have stable blood glucose profile and potentially avoid unintended weight gain.   - I encouraged the patient to  switch to unprocessed or minimally processed complex  starch and increased protein intake (animal or plant source), fruits, and vegetables.   - Patient is advised to stick to a routine mealtimes to eat 3 meals a day and avoid unnecessary snacks (to snack only to correct hypoglycemia).  - I have approached him with the following individualized plan to manage  his diabetes and patient agrees:   - he will continue to require insulin treatment in order for him to achieve and maintain control of diabetes to target.    -He is benefiting from simplified insulin regimen with premixed insulin.    -Based on his recent improvement in his daily readings, no changes will be made to his medication regimen today.  He is advised to continue his 70/30 at 50 units SQ BID with meals if glucose is above 90 and he is eating.  He can also continue Metformin 1000 mg po daily with breakfast (kidneys are stable).  I gave him sample of Ozempic 0.25 mg SQ weekly x 2 doses, then increase to 0.5 mg SQ weekly thereafter.  -He is encouraged to do better monitoring his blood glucose.  He is urged to monitor glucose at least twice per day, before injecting insulin (at breakfast and supper) and call the clinic if readings are less than 70 or greater than 300 for 3 tests in a row.  He could benefit from CGM device but is not interested at this time.  - Specific targets for  A1c;  LDL, HDL,  and Triglycerides were discussed with the patient.  2) Blood Pressure /Hypertension:  His blood pressure is controlled to target for his age.  He is advised to continue Clonidine 0.2 mg po twice daily, HCTZ 25 mg po daily, Losartan 100 mg po daily, and Metoprolol 100 mg po twice daily.    3) Lipids/Hyperlipidemia:  His most recent lipid panel from 03/04/21 shows controlled LDL of 77.  He is advised to continue Simvastatin 20 mg po daily at bedtime.  Side effects and precautions discussed with him.    4)  Weight/Diet:  His Body mass index is 40.58 kg/m.  -   clearly complicating his diabetes  care.   he is  a candidate for weight loss. I discussed with him the fact that loss of 5 - 10% of his  current body weight will have the most impact on his diabetes management.  Exercise, and detailed carbohydrates information provided  -  detailed on discharge instructions.  5) Hypothyroidism-acquired He does not have personal or family history of thyroid dysfunction.  His antibody testing was negative ruling out autoimmune thyroid dysfunction.    His previsit thyroid function tests are consistent with appropriate hormone replacement.  He is advised to continue his Levothyroxine 50 mcg po daily before breakfast.   - The correct intake of thyroid hormone (Levothyroxine, Synthroid), is on empty stomach first thing in the morning, with water, separated by at least 30 minutes from breakfast and other medications,  and separated by more than 4 hours from calcium, iron, multivitamins, acid reflux medications (PPIs).  - This medication is a life-long medication and will be needed to correct thyroid hormone imbalances for the rest of your life.  The dose may change from time to time, based on thyroid blood work.  - It is extremely important to be consistent taking this medication, near the same time each morning.  -AVOID TAKING PRODUCTS CONTAINING BIOTIN (commonly found in Hair, Skin, Nails vitamins) AS IT INTERFERES  WITH THE VALIDITY OF THYROID FUNCTION BLOOD TESTS.  6) Chronic Care/Health Maintenance: -he is on ACEI/ARB and Statin medications and is encouraged to initiate and continue to follow up with Ophthalmology, Dentist,  Podiatrist at least yearly or according to recommendations, and advised to  stay away from smoking. I have recommended yearly flu vaccine and pneumonia vaccine at least every 5 years; moderate intensity exercise for up to 150 minutes weekly; and  sleep for at least 7 hours a day.  - he is advised to maintain close follow up with Carrolyn Meiers, MD for primary care needs,  as well as his other providers for optimal and coordinated care.     I spent 41 minutes in the care of the patient today including review of labs from [place], Lipids, Thyroid Function, Hematology (current and previous including abstractions from other facilities); face-to-face time discussing  his blood glucose readings/logs, discussing hypoglycemia and hyperglycemia episodes and symptoms, medications doses, his options of short and long term treatment based on the latest standards of care / guidelines;  discussion about incorporating lifestyle medicine;  and documenting the encounter. Risk reduction counseling performed per USPSTF guidelines to reduce obesity and cardiovascular risk factors.     Please refer to Patient Instructions for Blood Glucose Monitoring and Insulin/Medications Dosing Guide"  in media tab for additional information. Please  also refer to " Patient Self Inventory" in the Media  tab for reviewed elements of pertinent patient history.  Gerald Evans participated in the discussions, expressed understanding, and voiced agreement with the above plans.  All questions were answered to his satisfaction. he is encouraged to contact clinic should he have any questions or concerns prior to his return visit.  Follow up plan: - Return in about 4 months (around 09/09/2022) for Diabetes F/U with A1c in office, No previsit labs, Bring meter and logs.  Rayetta Pigg, [facility] [address] [place], [place] [address] Phone: [phone] Fax: [phone]  05/11/2022, 11:08 AM

## 2022-05-11 NOTE — Patient Instructions (Signed)

## 2022-05-11 NOTE — Patient Instructions (Signed)
Schedule liver elastography The patient was found to have elevated blood pressure when vital signs were checked in the office. The blood pressure was rechecked by the nursing staff and it was found be persistently elevated >140/90 mmHg. I personally advised to the patient to follow up closely with his PCP for hypertension control. Perform blood workup Pulmonology referral Please reach your cardiologist at [facility] to discuss shortness of breath symptoms

## 2022-05-12 ENCOUNTER — Encounter: Payer: Self-pay | Admitting: *Deleted

## 2022-05-13 LAB — NASH FIBROSURE(R) PLUS
ALPHA 2-MACROGLOBULINS, QN: 155 mg/dL (ref 110–276)
ALT (SGPT) P5P: 15 IU/L (ref 0–55)
AST (SGOT) P5P: 19 IU/L (ref 0–40)
Apolipoprotein A-1: 143 mg/dL (ref 101–178)
Bilirubin, Total: 0.3 mg/dL (ref 0.0–1.2)
Cholesterol, Total: 163 mg/dL (ref 100–199)
Fibrosis Score: 0.52 — ABNORMAL HIGH (ref 0.00–0.21)
GGT: 28 IU/L (ref 0–65)
Glucose: 81 mg/dL (ref 70–99)
Haptoglobin: <10 mg/dL — ABNORMAL LOW (ref 34–355)
NASH Score: 0 (ref 0.00–0.25)
Steatosis Score: 0.32 (ref 0.00–0.40)
Triglycerides: 125 mg/dL (ref 0–149)

## 2022-05-13 NOTE — Progress Notes (Signed)
Chief Complaint:   OBESITY Gerald Evans is here to discuss his progress with his obesity treatment plan along with follow-up of his obesity related diagnoses. Gerald Evans is on the Category 2 Plan and states he is following his eating plan approximately 60% of the time. Gerald Evans states he is exercising 0 minutes 0 times per week.  Today's visit was #: 19 Starting weight: 242 lbs Starting date: 03/04/2021 Today's weight: 247 lbs Today's date: 05/06/2022 Total lbs lost to date: 0 lbs Total lbs lost since last in-office visit: 6  Interim History: This is Gerald Evans's first appointment wit me (Patient of Dr. Owens Shark and Valetta Fuller). Eats bacon and eggs in am+coffee with chia seeds+fiber. Will also do toast or oatmeal (1 packet)+fruit snack. Lunch is leftover meat 2 chicken legs, vegetables+ grapes. Snack is nuts or nabs. Dinner--1/2 potato, 2 pieces lamb,turkey wing.  Subjective:   1. Uncontrolled type 2 diabetes mellitus with hyperglycemia ([place]) Gerald Evans is on Humulin 70/30 with still elevated blood sugars. Last A1c 8.6.. He is on Metformin and insulin.  2. Vitamin D insufficiency Gerald Evans is currently taking prescription Vit D 50,000 IU once a week. Last Vit D of 45.3.  3. Other disorder of eating Gerald Evans is still over indulging on carbs without side effects of Topiramate.  Assessment/Plan:   1. Uncontrolled type 2 diabetes mellitus with hyperglycemia (HCC) Start Ozempic 0.25 mg SubQ once a week for 1 month with 0 refills. Discussed hypoglycemia protocol, will decrease insulin to 40-45 units twice a day risks. Side effects discussed.   -Start Semaglutide,0.25 or 0.5MG/DOS, (OZEMPIC, 0.25 OR 0.5 MG/DOSE,) 2 MG/3ML SOPN; Inject 0.25 mg into the skin once a week.  Dispense: 3 mL; Refill: 0  -Fill Insulin Pen Needle (BD PEN NEEDLE NANO 2ND GEN) 32G X 4 MM MISC; 1 Package by Does not apply route 2 (two) times daily.  Dispense: 100 each; Refill: 0  2. Vitamin D insufficiency We will refill Vit D 50K IU  once weekly for 1 month with 0 refills.  -Refill Vitamin D, Ergocalciferol, (DRISDOL) 1.25 MG (50000 UNIT) CAPS capsule; Take 1 capsule (50,000 Units total) by mouth every 7 (seven) days.  Dispense: 4 capsule; Refill: 0  3. Other disorder of eating We will refill Topamax 50 mg by mouth every night for 1 month with 0 refills.  -Refill topiramate (TOPAMAX) 50 MG tablet; Take 1 tablet (50 mg total) by mouth daily before supper. (Patient not taking: Reported on 05/11/2022)  Dispense: 30 tablet; Refill: 0  4. Obesity, Current BMI 40.0 Gerald Evans is currently in the action stage of change. As such, his goal is to continue with weight loss efforts. He has agreed to the Category 2 Plan with 8 oz at supper.  Exercise goals: Older adults should follow the adult guidelines. When older adults cannot meet the adult guidelines, they should be as physically active as their abilities and conditions will allow.   Behavioral modification strategies: increasing lean protein intake, meal planning and cooking strategies, keeping healthy foods in the home, and planning for success.  Gerald Evans has agreed to follow-up with our clinic in 3 weeks. He was informed of the importance of frequent follow-up visits to maximize his success with intensive lifestyle modifications for his multiple health conditions.   Objective:   Blood pressure (!) 162/74, pulse (!) 59, temperature 97.8 F (36.6 C), height 5' 6"  (1.676 m), weight 247 lb (112 kg), SpO2 98 %. Body mass index is 39.87 kg/m.  General: Cooperative, alert, well developed, in  no acute distress. HEENT: Conjunctivae and lids unremarkable. Cardiovascular: Regular rhythm.  Lungs: Normal work of breathing. Neurologic: No focal deficits.   Lab Results  Component Value Date   CREATININE 1.26 05/07/2022   BUN 18 05/07/2022   NA 140 05/07/2022   K 3.9 05/07/2022   CL 106 05/07/2022   CO2 20 05/07/2022   Lab Results  Component Value Date   ALT 12 05/07/2022   AST  19 05/07/2022   ALKPHOS 85 05/07/2022   BILITOT 0.4 05/07/2022   Lab Results  Component Value Date   HGBA1C 7.8 (A) 05/11/2022   HGBA1C 8.6 01/07/2022   HGBA1C 9.7 (A) 10/08/2021   HGBA1C 10.1 07/16/2021   HGBA1C 9.7 (A) 04/15/2021   No results found for: "INSULIN" Lab Results  Component Value Date   TSH 2.220 05/07/2022   Lab Results  Component Value Date   CHOL 163 05/11/2022   HDL 43 05/07/2022   LDLCALC 87 05/07/2022   TRIG 125 05/11/2022   CHOLHDL 3.4 05/07/2022   Lab Results  Component Value Date   VD25OH 45.3 12/01/2021   VD25OH 34.1 03/04/2021   Lab Results  Component Value Date   WBC 13.6 (H) 08/21/2020   HGB 11.8 (L) 08/21/2020   HCT 35.7 (L) 08/21/2020   MCV 73.6 (L) 08/21/2020   PLT 198 08/21/2020   Lab Results  Component Value Date   IRON 68 05/25/2016   TIBC 279 05/25/2016   FERRITIN 187 05/25/2016    Obesity Behavioral Intervention:   Approximately 15 minutes were spent on the discussion below.  ASK: We discussed the diagnosis of obesity with Gerald Evans today and Gerald Evans agreed to give [place] permission to discuss obesity behavioral modification therapy today.  ASSESS: Gerald Evans has the diagnosis of obesity and his BMI today is 40.0. Gerald Evans is in the action stage of change.   ADVISE: Gerald Evans was educated on the multiple health risks of obesity as well as the benefit of weight loss to improve his health. He was advised of the need for long term treatment and the importance of lifestyle modifications to improve his current health and to decrease his risk of future health problems.  AGREE: Multiple dietary modification options and treatment options were discussed and Gerald Evans agreed to follow the recommendations documented in the above note.  ARRANGE: Gerald Evans was educated on the importance of frequent visits to treat obesity as outlined per CMS and USPSTF guidelines and agreed to schedule his next follow up appointment today.  Attestation Statements:    Reviewed by clinician on day of visit: allergies, medications, problem list, medical history, surgical history, family history, social history, and previous encounter notes.  I, Elnora Morrison, RMA am acting as transcriptionist for Coralie Common, MD.  I have reviewed the above documentation for accuracy and completeness, and I agree with the above. - Coralie Common, MD

## 2022-05-20 ENCOUNTER — Ambulatory Visit (HOSPITAL_COMMUNITY): Payer: Medicare HMO

## 2022-05-27 ENCOUNTER — Ambulatory Visit (INDEPENDENT_AMBULATORY_CARE_PROVIDER_SITE_OTHER): Payer: Medicare HMO | Admitting: Family Medicine

## 2022-05-27 ENCOUNTER — Encounter (INDEPENDENT_AMBULATORY_CARE_PROVIDER_SITE_OTHER): Payer: Self-pay | Admitting: Family Medicine

## 2022-05-27 VITALS — BP 151/66 | HR 66 | Temp 98.0°F | Ht 66.0 in | Wt 249.0 lb

## 2022-05-27 DIAGNOSIS — Z6841 Body Mass Index (BMI) 40.0 and over, adult: Secondary | ICD-10-CM

## 2022-05-27 DIAGNOSIS — Z7985 Long-term (current) use of injectable non-insulin antidiabetic drugs: Secondary | ICD-10-CM

## 2022-05-27 DIAGNOSIS — I152 Hypertension secondary to endocrine disorders: Secondary | ICD-10-CM | POA: Diagnosis not present

## 2022-05-27 DIAGNOSIS — Z794 Long term (current) use of insulin: Secondary | ICD-10-CM

## 2022-05-27 DIAGNOSIS — E559 Vitamin D deficiency, unspecified: Secondary | ICD-10-CM

## 2022-05-27 DIAGNOSIS — E1159 Type 2 diabetes mellitus with other circulatory complications: Secondary | ICD-10-CM

## 2022-05-27 DIAGNOSIS — E1165 Type 2 diabetes mellitus with hyperglycemia: Secondary | ICD-10-CM

## 2022-05-27 DIAGNOSIS — E669 Obesity, unspecified: Secondary | ICD-10-CM

## 2022-05-27 MED ORDER — OZEMPIC (0.25 OR 0.5 MG/DOSE) 2 MG/3ML ~~LOC~~ SOPN: 0.5000 mg | PEN_INJECTOR | SUBCUTANEOUS | 0 refills | Status: DC

## 2022-05-27 MED ORDER — VITAMIN D (ERGOCALCIFEROL) 1.25 MG (50000 UNIT) PO CAPS: 50000.0000 [IU] | ORAL_CAPSULE | ORAL | 0 refills | Status: DC

## 2022-05-28 ENCOUNTER — Ambulatory Visit (HOSPITAL_COMMUNITY)
Admission: RE | Admit: 2022-05-28 | Discharge: 2022-05-28 | Disposition: A | Discharge status: Home / Self Care | Payer: Medicare HMO | Source: Ambulatory Visit | Attending: Gastroenterology | Admitting: Gastroenterology

## 2022-05-28 DIAGNOSIS — K7581 Nonalcoholic steatohepatitis (NASH): Secondary | ICD-10-CM | POA: Diagnosis not present

## 2022-05-28 DIAGNOSIS — K7400 Hepatic fibrosis, unspecified: Secondary | ICD-10-CM | POA: Insufficient documentation

## 2022-06-10 NOTE — Progress Notes (Signed)
Chief Complaint:   OBESITY Gerald Evans is here to discuss his progress with his obesity treatment plan along with follow-up of his obesity related diagnoses. Gerald Evans is on the Category 2 Plan+ 8 oz with supper and states he is following his eating plan approximately 70% of the time. Gerald Evans states he is walking 10 minutes 7 times per week.  Today's visit was #: 68 Starting weight: 242 lbs Starting date: 03/04/2021 Today's weight: 249 lbs Today's date: 05/27/2022 Total lbs lost to date: 0 lbs Total lbs lost since last in-office visit: 0  Interim History: Gerald Evans has had a good few weeks. Started Ozempic since last appointment. He has been able to get all food in on plan. He is getting meat and vegetables in evening. Plans to go to [place] for holiday party.  Subjective:   1. Type 2 diabetes mellitus with hyperglycemia, with long-term current use of insulin (HCC) Gerald Evans is on 1 time a day Humulin 70/30. Fasting blood sugars often <130. Noticing more focus on increased protein and controlling carbs.  2. Vitamin D insufficiency Gerald Evans is currently taking prescription Vit D 50,000 IU once a week.  Last level of Vit D of 44 in June. He note fatigue.  3. Hypertension associated with diabetes ([place]) Gerald Evans's blood pressure elevated today. He is taking Cozaar, Lopressor, Clonidine, HCTZ. Not taking HCTZ anymore due to SOB.  Assessment/Plan:   1. Type 2 diabetes mellitus with hyperglycemia, with long-term current use of insulin (HCC) Increase/refill Ozempic 0.5 mg SubQ once a week for 1 month with 0 refills.  -Increase/Refill Semaglutide,0.25 or 0.5MG/DOS, (OZEMPIC, 0.25 OR 0.5 MG/DOSE,) 2 MG/3ML SOPN; Inject 0.5 mg into the skin once a week.  Dispense: 3 mL; Refill: 0  2. Vitamin D insufficiency We will refill Vit D 50K IU once a week for 1 month with 0 refills.  -Refill Vitamin D, Ergocalciferol, (DRISDOL) 1.25 MG (50000 UNIT) CAPS capsule; Take 1 capsule (50,000 Units total) by  mouth every 7 (seven) days.  Dispense: 4 capsule; Refill: 0  3. Hypertension associated with diabetes [facility]) Patient is to call cardiology to follow up on blood pressure management and plan.  4. Obesity, Current BMI 40.3 Gerald Evans is currently in the action stage of change. As such, his goal is to continue with weight loss efforts. He has agreed to the Category 2 Plan.   Exercise goals: All adults should avoid inactivity. Some physical activity is better than none, and adults who participate in any amount of physical activity gain some health benefits.  Behavioral modification strategies: increasing lean protein intake, meal planning and cooking strategies, keeping healthy foods in the home, and planning for success.  Gerald Evans has agreed to follow-up with our clinic in 3 weeks. He was informed of the importance of frequent follow-up visits to maximize his success with intensive lifestyle modifications for his multiple health conditions.   Objective:   Blood pressure (!) 151/66, pulse 66, temperature 98 F (36.7 C), height 5' 6"  (1.676 m), weight 249 lb (112.9 kg), SpO2 96 %. Body mass index is 40.19 kg/m.  General: Cooperative, alert, well developed, in no acute distress. HEENT: Conjunctivae and lids unremarkable. Cardiovascular: Regular rhythm.  Lungs: Normal work of breathing. Neurologic: No focal deficits.   Lab Results  Component Value Date   CREATININE 1.26 05/07/2022   BUN 18 05/07/2022   NA 140 05/07/2022   K 3.9 05/07/2022   CL 106 05/07/2022   CO2 20 05/07/2022   Lab Results  Component Value  Date   ALT 12 05/07/2022   AST 19 05/07/2022   ALKPHOS 85 05/07/2022   BILITOT 0.4 05/07/2022   Lab Results  Component Value Date   HGBA1C 7.8 (A) 05/11/2022   HGBA1C 8.6 01/07/2022   HGBA1C 9.7 (A) 10/08/2021   HGBA1C 10.1 07/16/2021   HGBA1C 9.7 (A) 04/15/2021   No results found for: "INSULIN" Lab Results  Component Value Date   TSH 2.220 05/07/2022   Lab Results   Component Value Date   CHOL 163 05/11/2022   HDL 43 05/07/2022   LDLCALC 87 05/07/2022   TRIG 125 05/11/2022   CHOLHDL 3.4 05/07/2022   Lab Results  Component Value Date   VD25OH 45.3 12/01/2021   VD25OH 34.1 03/04/2021   Lab Results  Component Value Date   WBC 13.6 (H) 08/21/2020   HGB 11.8 (L) 08/21/2020   HCT 35.7 (L) 08/21/2020   MCV 73.6 (L) 08/21/2020   PLT 198 08/21/2020   Lab Results  Component Value Date   IRON 68 05/25/2016   TIBC 279 05/25/2016   FERRITIN 187 05/25/2016    Obesity Behavioral Intervention:   Approximately 15 minutes were spent on the discussion below.  ASK: We discussed the diagnosis of obesity with Gerald Evans today and Gerald Evans agreed to give [place] permission to discuss obesity behavioral modification therapy today.  ASSESS: Gerald Evans has the diagnosis of obesity and his BMI today is 40.3. Gerald Evans is in the action stage of change.   ADVISE: Gerald Evans was educated on the multiple health risks of obesity as well as the benefit of weight loss to improve his health. He was advised of the need for long term treatment and the importance of lifestyle modifications to improve his current health and to decrease his risk of future health problems.  AGREE: Multiple dietary modification options and treatment options were discussed and Gerald Evans agreed to follow the recommendations documented in the above note.  ARRANGE: Gerald Evans was educated on the importance of frequent visits to treat obesity as outlined per CMS and USPSTF guidelines and agreed to schedule his next follow up appointment today.  Attestation Statements:   Reviewed by clinician on day of visit: allergies, medications, problem list, medical history, surgical history, family history, social history, and previous encounter notes.  I, Elnora Morrison, RMA am acting as transcriptionist for Coralie Common, MD.  I have reviewed the above documentation for accuracy and completeness, and I agree with  the above. - Coralie Common, MD

## 2022-06-17 ENCOUNTER — Ambulatory Visit (INDEPENDENT_AMBULATORY_CARE_PROVIDER_SITE_OTHER): Payer: Medicare HMO | Admitting: Family Medicine

## 2022-06-17 ENCOUNTER — Encounter (INDEPENDENT_AMBULATORY_CARE_PROVIDER_SITE_OTHER): Payer: Self-pay | Admitting: Family Medicine

## 2022-06-17 VITALS — BP 143/71 | HR 64 | Temp 97.5°F | Ht 66.0 in | Wt 246.0 lb

## 2022-06-17 DIAGNOSIS — E559 Vitamin D deficiency, unspecified: Secondary | ICD-10-CM | POA: Diagnosis not present

## 2022-06-17 DIAGNOSIS — Z6839 Body mass index (BMI) 39.0-39.9, adult: Secondary | ICD-10-CM | POA: Diagnosis not present

## 2022-06-17 DIAGNOSIS — E669 Obesity, unspecified: Secondary | ICD-10-CM

## 2022-06-17 DIAGNOSIS — Z7985 Long-term (current) use of injectable non-insulin antidiabetic drugs: Secondary | ICD-10-CM

## 2022-06-17 DIAGNOSIS — E1165 Type 2 diabetes mellitus with hyperglycemia: Secondary | ICD-10-CM | POA: Diagnosis not present

## 2022-06-17 DIAGNOSIS — Z794 Long term (current) use of insulin: Secondary | ICD-10-CM

## 2022-06-17 MED ORDER — NOVOLIN 70/30 FLEXPEN (70-30) 100 UNIT/ML ~~LOC~~ SUPN: 50.0000 [IU] | PEN_INJECTOR | Freq: Every day | SUBCUTANEOUS | 3 refills | Status: DC

## 2022-06-17 MED ORDER — OZEMPIC (0.25 OR 0.5 MG/DOSE) 2 MG/3ML ~~LOC~~ SOPN: 0.5000 mg | PEN_INJECTOR | SUBCUTANEOUS | 0 refills | Status: DC

## 2022-06-17 MED ORDER — VITAMIN D (ERGOCALCIFEROL) 1.25 MG (50000 UNIT) PO CAPS: 50000.0000 [IU] | ORAL_CAPSULE | ORAL | 0 refills | Status: DC

## 2022-06-25 ENCOUNTER — Encounter: Payer: Self-pay | Admitting: Internal Medicine

## 2022-06-25 ENCOUNTER — Ambulatory Visit (INDEPENDENT_AMBULATORY_CARE_PROVIDER_SITE_OTHER): Payer: Medicare HMO | Admitting: Internal Medicine

## 2022-06-25 ENCOUNTER — Ambulatory Visit (HOSPITAL_COMMUNITY)
Admission: RE | Admit: 2022-06-25 | Discharge: 2022-06-25 | Disposition: A | Discharge status: Home / Self Care | Payer: Medicare HMO | Source: Ambulatory Visit | Attending: Internal Medicine | Admitting: Internal Medicine

## 2022-06-25 VITALS — BP 132/84 | HR 72 | Temp 98.3°F | Ht 67.0 in | Wt 256.0 lb

## 2022-06-25 DIAGNOSIS — R0609 Other forms of dyspnea: Secondary | ICD-10-CM | POA: Diagnosis present

## 2022-06-25 NOTE — Patient Instructions (Signed)
Please remember to go to the lab department   for your tests - we will call you with the results when they are available.      Please remember to go to the  x-ray department  @  [facility] for your tests - we will call you with the results when they are available     Pulmonary follow up will be arranged depending on results of above.

## 2022-06-25 NOTE — Progress Notes (Signed)
Gerald Evans, male    DOB: 1945/11/24    MRN: [id number]   Brief patient profile:  76   yobm  quit smoking age 76 s apparent sequelae referred to pulmonary clinic in [place]  06/25/2022 by Dr Avon Gully for doe already eval by [facility]:  Echocardiogram 2D complete: (03/26/2022) INTERPRETATION NORMAL LEFT VENTRICULAR SYSTOLIC FUNCTION WITH MILD LVH NORMAL RIGHT VENTRICULAR SYSTOLIC FUNCTION NO VALVULAR STENOSIS TRIVIAL MR, TR, PR EF >55%  NM Myocardial Perfusion SPECT multiple (stress and rest): (03/26/2022) IMPRESSION: Normal myocardial perfusion scan no evidence of stress-induced myocardial ischemia ejection fraction of 60% conclusion negative scan     History of Present Illness  06/25/2022  Pulmonary/ 1st office eval/ [name] / [place] [facility]  Chief Complaint  Patient presents with   Consult    SOB x 6 week s   Dyspnea:  onset ? 1 y prior to OV   After climbing steps has stop at top - assoc with peripheral edema but if anything thinks got worse as swelling resolved. Cough: none  Sleep: bed is flat / one pillow  SABA use: none  02: none   No obvious day to day or daytime pattern/variability or assoc excess/ purulent sputum or mucus plugs or hemoptysis or cp or chest tightness, subjective wheeze or overt sinus or hb symptoms.   Sleeping as above without nocturnal  or early am exacerbation  of respiratory  c/o's or need for noct saba. Also denies any obvious fluctuation of symptoms with weather or environmental changes or other aggravating or alleviating factors except as outlined above   No unusual exposure hx or h/o childhood pna/ asthma or knowledge of premature birth.  Current Allergies, Complete Past Medical History, Past Surgical History, Family History, and Social History were reviewed in [employer] record.  ROS  The following are not active complaints unless bolded Hoarseness, sore throat, dysphagia, dental problems,  itching, sneezing,  nasal congestion or discharge of excess mucus or purulent secretions, ear ache,   fever, chills, sweats, unintended wt loss or wt gain, classically pleuritic or exertional cp,  orthopnea pnd or arm/hand swelling  or leg swelling, presyncope, palpitations, abdominal pain, anorexia, nausea, vomiting, diarrhea  or change in bowel habits or change in bladder habits, change in stools or change in urine, dysuria, hematuria,  rash, arthralgias, visual complaints, headache, numbness, weakness or ataxia or problems with walking or coordination,  change in mood or  memory.             Past Medical History:  Diagnosis Date   Colon polyps    Diabetes mellitus without complication (HCC)    Edema of both lower extremities    Fatty liver    Hypercholesteremia    Hypertension     Outpatient Medications Prior to Visit  Medication Sig Dispense Refill   blood glucose meter kit and supplies Dispense based on patient and insurance preference. Use up to four times daily as directed. (FOR ICD-10 E11.65) 1 each 0   glucose blood (ONETOUCH ULTRA) test strip Use 1 strip to check glucose 3 times daily as directed. 300 each 1   Insulin Pen Needle (BD PEN NEEDLE NANO 2ND GEN) 32G X 4 MM MISC 1 Package by Does not apply route 2 (two) times daily. 100 each 0   levothyroxine (SYNTHROID) 50 MCG tablet Take 1 tablet (50 mcg total) by mouth daily before breakfast. 90 tablet 1   losartan (COZAAR) 100 MG tablet Take 100 mg  by mouth daily.     metFORMIN (GLUCOPHAGE) 1000 MG tablet Take 1 tablet (1,000 mg total) by mouth daily with breakfast. 90 tablet 3   metoprolol (LOPRESSOR) 100 MG tablet Take 100 mg by mouth 2 (two) times daily.      NOVOLIN 70/30 KWIKPEN (70-30) 100 UNIT/ML KwikPen Inject 50 Units into the skin daily. 90 mL 3   OneTouch Delica Lancets 89Q MISC 1 each by Does not apply route 3 (three) times daily. Use to check blood glucose three times daily 100 each 3   Semaglutide,0.25 or 0.5MG/DOS,  (OZEMPIC, 0.25 OR 0.5 MG/DOSE,) 2 MG/3ML SOPN Inject 0.5 mg into the skin once a week. 3 mL 0   sildenafil (VIAGRA) 100 MG tablet Take 100 mg by mouth daily as needed.     simvastatin (ZOCOR) 20 MG tablet Take 20 mg by mouth daily.     Vitamin D, Ergocalciferol, (DRISDOL) 1.25 MG (50000 UNIT) CAPS capsule Take 1 capsule (50,000 Units total) by mouth every 7 (seven) days. 4 capsule 0   No facility-administered medications prior to visit.     Objective:     BP 132/84   Pulse 72   Temp 98.3 F (36.8 C)   Ht _0  (1.702 m)   Wt 256 lb (116.1 kg)   SpO2 96% Comment: ra  BMI 40.10 kg/m   SpO2: 96 % (ra)  Wt Readings from Last 3 Encounters:  06/25/22 256 lb (116.1 kg)  06/17/22 246 lb (111.6 kg)  05/27/22 249 lb (112.9 kg)     Vital signs reviewed  06/25/2022  - Note at rest 02 sats  96% on RA   General appearance:        Amb [place] MO (by bmi) male nad    HEENT : Oropharynx  clear  /    Nasal turbinates nl    NECK :  without  apparent JVD/ palpable Nodes/TM    LUNGS: no acc muscle use,  Nl contour chest which is clear to A and P bilaterally without cough on insp or exp maneuvers   CV:  RRR  no s3 or murmur or increase in P2, and no edema   ABD:  obese soft and nontender with nl inspiratory excursion in the supine position. No bruits or organomegaly appreciated   MS:  Nl gait/ ext warm without deformities Or obvious joint restrictions  calf tenderness, cyanosis or clubbing    SKIN: warm and dry without lesions    NEURO:  alert, approp, nl sensorium with  no motor or cerebellar deficits apparent.    CXR PA and Lateral:   06/25/2022 :    I personally reviewed images and agree with radiology impression as follows:    Enlargement of cardiac silhouette without acute abnormalities. Aortic Atherosclerosis (ICD10-I70.0)   Labs ordered/ reviewed:      Chemistry      Component Value Date/Time   NA 141 06/25/2022 1138   K 4.0 06/25/2022 1138   CL 105 06/25/2022  1138   CO2 21 06/25/2022 1138   BUN 17 06/25/2022 1138   CREATININE 1.18 06/25/2022 1138   CREATININE 1.00 03/18/2020 1321   GLU 81 05/11/2022 1217      Component Value Date/Time   CALCIUM 9.7 06/25/2022 1138   ALKPHOS 85 05/07/2022 1053   AST 19 05/07/2022 1053   ALT 12 05/07/2022 1053   BILITOT 0.4 05/07/2022 1053        Lab Results  Component Value Date   WBC  8.3 06/25/2022   HGB 12.9 (L) 06/25/2022   HCT 42.1 06/25/2022   MCV 75 (L) 06/25/2022   PLT 202 06/25/2022     Lab Results  Component Value Date   DDIMER 0.96 (H) 06/25/2022      Lab Results  Component Value Date   TSH 2.220 05/07/2022     BNP   06/25/2022   = 125     Lab Results  Component Value Date   ESRSEDRATE 7 05/25/2016           Assessment   DOE (dyspnea on exertion) Onset 2023 in setting of morbid obesity/ hbp with mild LVH and peripheral edema - cards w/u at [facility] clinic completed 02/2022 neg  - 06/25/2022   at wt = 256(BMI 40) Walked on RA  x  3  lap(s) =  approx 450  ft  @ moderate pace, stopped due to end of study with lowest 02 sats 94% with sob on 3rd lap     not clear to what extent this is actually a pulmonary  problem but pt does appear to have difficult to sort out respiratory symptoms of unknown origin for which  DDX  = almost all start with A and  include Adherence, Ace Inhibitors, Acid Reflux, Active Sinus Disease, Alpha 1 Antitripsin deficiency, Anxiety masquerading as Airways dz,  ABPA,  Allergy(esp in young), Aspiration (esp in elderly), Adverse effects of meds,  Active smoking or Vaping, A bunch of PE's/clot burden (a few small clots can't cause this syndrome unless there is already severe underlying pulm or vascular dz with poor reserve),  Anemia or thyroid disorder, plus two Bs  = Bronchiectasis and Beta blocker use..and one C= CHF    Labs look ok with  ddimer wnl for age and MCV chronically low with nl hgb and relatively preserved MCHC  c/w Thalassemia trait    In terms  of asthma/ copd:  When respiratory symptoms begin or become refractory well after a patient reports complete smoking cessation, especially when this wasn't the case while they were smoking, a red flag is raised based on the work of Dr Kris Mouton which states:  if you quit smoking when your best day FEV1 is still well preserved it is highly unlikely you will progress to severe disease.  That is to say, once the smoking stops,  the symptoms should not suddenly erupt or markedly worsen.  If so, the differential diagnosis should include  obesity/deconditioning,  LPR/Reflux/Aspiration syndromes,  occult CHF(esp diastolic dysfunction ) , or  especially side effect of medications commonly used in this population (listed as allergic to lisinopril so not on ace and none of the other suspects listed)   This leaves [place] with obesity as the most likely cause of his symptoms but will do pfts to be complete and encourage regular 30 min sub max ex with f/u CPST if not satisfied with response to recs   Each maintenance medication was reviewed in detail including emphasizing most importantly the difference between maintenance and prns and under what circumstances the prns are to be triggered using an action plan format where appropriate.  Total time for H and P, chart review, counseling,  directly observing portions of ambulatory 02 saturation study/ and generating customized AVS unique to this office visit / same day charting = 45 min with pt new to me                   Christinia Gully, MD 06/25/2022

## 2022-06-26 ENCOUNTER — Encounter: Payer: Self-pay | Admitting: Internal Medicine

## 2022-06-26 LAB — CBC WITH DIFFERENTIAL/PLATELET
Basophils Absolute: 0 10*3/uL (ref 0.0–0.2)
Basos: 1 %
EOS (ABSOLUTE): 0.1 10*3/uL (ref 0.0–0.4)
Eos: 2 %
Hematocrit: 42.1 % (ref 37.5–51.0)
Hemoglobin: 12.9 g/dL — ABNORMAL LOW (ref 13.0–17.7)
Immature Grans (Abs): 0 10*3/uL (ref 0.0–0.1)
Immature Granulocytes: 1 %
Lymphocytes Absolute: 3.2 10*3/uL — ABNORMAL HIGH (ref 0.7–3.1)
Lymphs: 38 %
MCH: 22.9 pg — ABNORMAL LOW (ref 26.6–33.0)
MCHC: 30.6 g/dL — ABNORMAL LOW (ref 31.5–35.7)
MCV: 75 fL — ABNORMAL LOW (ref 79–97)
Monocytes Absolute: 0.6 10*3/uL (ref 0.1–0.9)
Monocytes: 7 %
Neutrophils Absolute: 4.3 10*3/uL (ref 1.4–7.0)
Neutrophils: 51 %
Platelets: 202 10*3/uL (ref 150–450)
RBC: 5.64 x10E6/uL (ref 4.14–5.80)
RDW: 17.9 % — ABNORMAL HIGH (ref 11.6–15.4)
WBC: 8.3 10*3/uL (ref 3.4–10.8)

## 2022-06-26 LAB — D-DIMER, QUANTITATIVE: D-DIMER: 0.96 mg/L FEU — ABNORMAL HIGH (ref 0.00–0.49)

## 2022-06-26 LAB — BASIC METABOLIC PANEL
BUN/Creatinine Ratio: 14 (ref 10–24)
BUN: 17 mg/dL (ref 8–27)
CO2: 21 mmol/L (ref 20–29)
Calcium: 9.7 mg/dL (ref 8.6–10.2)
Chloride: 105 mmol/L (ref 96–106)
Creatinine, Ser: 1.18 mg/dL (ref 0.76–1.27)
Glucose: 168 mg/dL — ABNORMAL HIGH (ref 70–99)
Potassium: 4 mmol/L (ref 3.5–5.2)
Sodium: 141 mmol/L (ref 134–144)
eGFR: 64 mL/min/{1.73_m2} (ref >59)

## 2022-06-26 LAB — BRAIN NATRIURETIC PEPTIDE: BNP: 124.7 pg/mL — ABNORMAL HIGH (ref 0.0–100.0)

## 2022-06-26 NOTE — Assessment & Plan Note (Addendum)
Onset 2023 in setting of morbid obesity/ hbp with mild LVH and peripheral edema - cards w/u at [facility] clinic completed 02/2022 neg  - 06/25/2022   at wt = 256(BMI 40) Walked on RA  x  3  lap(s) =  approx 450  ft  @ moderate pace, stopped due to end of study with lowest 02 sats 94% with sob on 3rd lap     not clear to what extent this is actually a pulmonary  problem but pt does appear to have difficult to sort out respiratory symptoms of unknown origin for which  DDX  = almost all start with A and  include Adherence, Ace Inhibitors, Acid Reflux, Active Sinus Disease, Alpha 1 Antitripsin deficiency, Anxiety masquerading as Airways dz,  ABPA,  Allergy(esp in young), Aspiration (esp in elderly), Adverse effects of meds,  Active smoking or Vaping, A bunch of PE's/clot burden (a few small clots can't cause this syndrome unless there is already severe underlying pulm or vascular dz with poor reserve),  Anemia or thyroid disorder, plus two Bs  = Bronchiectasis and Beta blocker use..and one C= CHF    Labs look ok with  ddimer wnl for age and MCV chronically low with nl hgb and relatively preserved MCHC  c/w Thalassemia trait    In terms of asthma/ copd:  When respiratory symptoms begin or become refractory well after a patient reports complete smoking cessation, especially when this wasn't the case while they were smoking, a red flag is raised based on the work of Dr Kris Mouton which states:  if you quit smoking when your best day FEV1 is still well preserved it is highly unlikely you will progress to severe disease.  That is to say, once the smoking stops,  the symptoms should not suddenly erupt or markedly worsen.  If so, the differential diagnosis should include  obesity/deconditioning,  LPR/Reflux/Aspiration syndromes,  occult CHF(esp diastolic dysfunction ) , or  especially side effect of medications commonly used in this population (listed as allergic to lisinopril so not on ace and none of the other  suspects listed)   This leaves [place] with obesity as the most likely cause of his symptoms but will do pfts to be complete and encourage regular 30 min sub max ex with f/u CPST if not satisfied with response to recs   Each maintenance medication was reviewed in detail including emphasizing most importantly the difference between maintenance and prns and under what circumstances the prns are to be triggered using an action plan format where appropriate.  Total time for H and P, chart review, counseling,  directly observing portions of ambulatory 02 saturation study/ and generating customized AVS unique to this office visit / same day charting = 45 min with pt new to me

## 2022-06-30 NOTE — Progress Notes (Signed)
Called the pt and there was no answer, LMTCB.

## 2022-07-02 ENCOUNTER — Institutional Professional Consult (permissible substitution): Payer: Medicare HMO | Admitting: Internal Medicine

## 2022-07-06 NOTE — Progress Notes (Signed)
Chief Complaint:   OBESITY Gerald Evans is here to discuss his progress with his obesity treatment plan along with follow-up of his obesity related diagnoses. Gerald Evans is on the Category 2 Plan and states he is following his eating plan approximately 70% of the time. Gerald Evans states he is exercising 0 minutes 0 times per week.  Today's visit was #: 54 Starting weight: 242 lbs Starting date: 03/04/2021 Today's weight: 246 lbs Today's date: 06/17/2022 Total lbs lost to date: 0 lbs Total lbs lost since last in-office visit: 3  Interim History: Gerald Evans voices he does not feel he is doing so well.  The problem lies with him- not eating as much as he used to.  Thinks he is getting all protein.  The next few weeks he wants to just commit to being mindful of food portions.  Subjective:   1. Type 2 diabetes mellitus with hyperglycemia, with long-term current use of insulin (Gerald Evans) Gerald Evans voices he is doing well blood sugar wise-most less than 120. 40 units daily now.  2. Vitamin D insufficiency Gerald Evans is currently taking prescription Vit D 50,000 IU once a week.   Assessment/Plan:   1. Type 2 diabetes mellitus with hyperglycemia, with long-term current use of insulin (Gerald Evans) We will refill Ozempic 0.5 mg SubQ once a week AND Novolin 50 units daily for 1 month with 0 refills. Labs with Dr. Legrand Rams on 12/29.  -Refill Semaglutide,0.25 or 0.'5MG'$ /DOS, (OZEMPIC, 0.25 OR 0.5 MG/DOSE,) 2 MG/3ML SOPN; Inject 0.5 mg into the skin once a week.  Dispense: 3 mL; Refill: 0  -Refill NOVOLIN 70/30 KWIKPEN (70-30) 100 UNIT/ML KwikPen; Inject 50 Units into the skin daily.  Dispense: 90 mL; Refill: 3  2. Vitamin D insufficiency We will refill Vit D 50K IU once a week for 1 month with 0 refills. Labs with Dr. Legrand Rams on 12/29.  -Refill Vitamin D, Ergocalciferol, (DRISDOL) 1.25 MG (50000 UNIT) CAPS capsule; Take 1 capsule (50,000 Units total) by mouth every 7 (seven) days.  Dispense: 4 capsule; Refill: 0  3.  Obesity, Current BMI 39.8 Gerald Evans is currently in the action stage of change. As such, his goal is to continue with weight loss efforts. He has agreed to the Category 2 Plan.   Exercise goals: Older adults should follow the adult guidelines. When older adults cannot meet the adult guidelines, they should be as physically active as their abilities and conditions will allow.   Behavioral modification strategies: increasing lean protein intake, meal planning and cooking strategies, keeping healthy foods in the home, travel eating strategies, and holiday eating strategies .  Gerald Evans has agreed to follow-up with our clinic in 4 weeks. He was informed of the importance of frequent follow-up visits to maximize his success with intensive lifestyle modifications for his multiple health conditions.   Objective:   Blood pressure (!) 143/71, pulse 64, temperature (!) 97.5 F (36.4 C), height '5\' 6"'$  (1.676 m), weight 246 lb (111.6 kg), SpO2 98 %. Body mass index is 39.71 kg/m.  General: Cooperative, alert, well developed, in no acute distress. HEENT: Conjunctivae and lids unremarkable. Cardiovascular: Regular rhythm.  Lungs: Normal work of breathing. Neurologic: No focal deficits.   Lab Results  Component Value Date   CREATININE 1.18 06/25/2022   BUN 17 06/25/2022   NA 141 06/25/2022   K 4.0 06/25/2022   CL 105 06/25/2022   CO2 21 06/25/2022   Lab Results  Component Value Date   ALT 12 05/07/2022   AST 19 05/07/2022  ALKPHOS 85 05/07/2022   BILITOT 0.4 05/07/2022   Lab Results  Component Value Date   HGBA1C 7.8 (A) 05/11/2022   HGBA1C 8.6 01/07/2022   HGBA1C 9.7 (A) 10/08/2021   HGBA1C 10.1 07/16/2021   HGBA1C 9.7 (A) 04/15/2021   No results found for: "INSULIN" Lab Results  Component Value Date   TSH 2.220 05/07/2022   Lab Results  Component Value Date   CHOL 163 05/11/2022   HDL 43 05/07/2022   LDLCALC 87 05/07/2022   TRIG 125 05/11/2022   CHOLHDL 3.4 05/07/2022   Lab  Results  Component Value Date   VD25OH 45.3 12/01/2021   VD25OH 34.1 03/04/2021   Lab Results  Component Value Date   WBC 8.3 06/25/2022   HGB 12.9 (L) 06/25/2022   HCT 42.1 06/25/2022   MCV 75 (L) 06/25/2022   PLT 202 06/25/2022   Lab Results  Component Value Date   IRON 68 05/25/2016   TIBC 279 05/25/2016   FERRITIN 187 05/25/2016   Attestation Statements:   Reviewed by clinician on day of visit: allergies, medications, problem list, medical history, surgical history, family history, social history, and previous encounter notes.  I, Elnora Morrison, RMA am acting as transcriptionist for Coralie Common, MD.  I have reviewed the above documentation for accuracy and completeness, and I agree with the above. - Coralie Common, MD

## 2022-07-14 ENCOUNTER — Ambulatory Visit: Payer: Medicare HMO | Admitting: Internal Medicine

## 2022-07-16 ENCOUNTER — Other Ambulatory Visit: Payer: Medicare HMO

## 2022-07-17 ENCOUNTER — Other Ambulatory Visit (INDEPENDENT_AMBULATORY_CARE_PROVIDER_SITE_OTHER): Payer: Self-pay | Admitting: Family Medicine

## 2022-07-17 DIAGNOSIS — E559 Vitamin D deficiency, unspecified: Secondary | ICD-10-CM

## 2022-07-23 ENCOUNTER — Encounter (INDEPENDENT_AMBULATORY_CARE_PROVIDER_SITE_OTHER): Payer: Self-pay | Admitting: Family Medicine

## 2022-07-23 ENCOUNTER — Ambulatory Visit: Payer: Medicare HMO | Admitting: Urology

## 2022-07-23 ENCOUNTER — Ambulatory Visit (INDEPENDENT_AMBULATORY_CARE_PROVIDER_SITE_OTHER): Payer: Medicare HMO | Admitting: Family Medicine

## 2022-07-23 VITALS — BP 142/77 | HR 67 | Temp 97.6°F | Ht 66.0 in | Wt 247.0 lb

## 2022-07-23 DIAGNOSIS — E038 Other specified hypothyroidism: Secondary | ICD-10-CM

## 2022-07-23 DIAGNOSIS — E1165 Type 2 diabetes mellitus with hyperglycemia: Secondary | ICD-10-CM

## 2022-07-23 DIAGNOSIS — Z794 Long term (current) use of insulin: Secondary | ICD-10-CM

## 2022-07-23 DIAGNOSIS — D509 Iron deficiency anemia, unspecified: Secondary | ICD-10-CM

## 2022-07-23 DIAGNOSIS — Z6836 Body mass index (BMI) 36.0-36.9, adult: Secondary | ICD-10-CM

## 2022-07-23 DIAGNOSIS — E669 Obesity, unspecified: Secondary | ICD-10-CM

## 2022-07-23 DIAGNOSIS — E785 Hyperlipidemia, unspecified: Secondary | ICD-10-CM

## 2022-07-23 DIAGNOSIS — E1169 Type 2 diabetes mellitus with other specified complication: Secondary | ICD-10-CM

## 2022-07-23 DIAGNOSIS — E559 Vitamin D deficiency, unspecified: Secondary | ICD-10-CM

## 2022-07-23 DIAGNOSIS — Z7985 Long-term (current) use of injectable non-insulin antidiabetic drugs: Secondary | ICD-10-CM

## 2022-07-23 MED ORDER — OZEMPIC (0.25 OR 0.5 MG/DOSE) 2 MG/3ML ~~LOC~~ SOPN: 0.5000 mg | PEN_INJECTOR | SUBCUTANEOUS | 0 refills | Status: DC

## 2022-07-23 MED ORDER — VITAMIN D (ERGOCALCIFEROL) 1.25 MG (50000 UNIT) PO CAPS: 50000.0000 [IU] | ORAL_CAPSULE | ORAL | 0 refills | Status: DC

## 2022-07-29 LAB — COMPREHENSIVE METABOLIC PANEL
ALT: 16 IU/L (ref 0–44)
AST: 20 IU/L (ref 0–40)
Albumin/Globulin Ratio: 1.8 (ref 1.2–2.2)
Albumin: 4.2 g/dL (ref 3.8–4.8)
Alkaline Phosphatase: 83 IU/L (ref 44–121)
BUN/Creatinine Ratio: 9 — ABNORMAL LOW (ref 10–24)
BUN: 12 mg/dL (ref 8–27)
Bilirubin Total: 0.3 mg/dL (ref 0.0–1.2)
CO2: 20 mmol/L (ref 20–29)
Calcium: 9.2 mg/dL (ref 8.6–10.2)
Chloride: 104 mmol/L (ref 96–106)
Creatinine, Ser: 1.28 mg/dL — ABNORMAL HIGH (ref 0.76–1.27)
Globulin, Total: 2.3 g/dL (ref 1.5–4.5)
Glucose: 100 mg/dL — ABNORMAL HIGH (ref 70–99)
Potassium: 3.9 mmol/L (ref 3.5–5.2)
Sodium: 139 mmol/L (ref 134–144)
Total Protein: 6.5 g/dL (ref 6.0–8.5)
eGFR: 58 mL/min/{1.73_m2} — ABNORMAL LOW (ref >59)

## 2022-07-29 LAB — CBC WITH DIFFERENTIAL/PLATELET
Basophils Absolute: 0 10*3/uL (ref 0.0–0.2)
Basos: 0 %
EOS (ABSOLUTE): 0.3 10*3/uL (ref 0.0–0.4)
Eos: 4 %
Hemoglobin: 12.5 g/dL — ABNORMAL LOW (ref 13.0–17.7)
Immature Grans (Abs): 0 10*3/uL (ref 0.0–0.1)
Immature Granulocytes: 0 %
Lymphocytes Absolute: 3.5 10*3/uL — ABNORMAL HIGH (ref 0.7–3.1)
Lymphs: 47 %
MCH: 23.8 pg — ABNORMAL LOW (ref 26.6–33.0)
MCHC: 32.1 g/dL (ref 31.5–35.7)
MCV: 74 fL — ABNORMAL LOW (ref 79–97)
Monocytes Absolute: 0.6 10*3/uL (ref 0.1–0.9)
Monocytes: 7 %
Neutrophils Absolute: 3.2 10*3/uL (ref 1.4–7.0)
Neutrophils: 42 %
Platelets: 218 10*3/uL (ref 150–450)
RBC: 5.26 x10E6/uL (ref 4.14–5.80)
RDW: 18.3 % — ABNORMAL HIGH (ref 11.6–15.4)
WBC: 7.5 10*3/uL (ref 3.4–10.8)

## 2022-07-29 LAB — ANEMIA PANEL
Ferritin: 124 ng/mL (ref 30–400)
Folate, Hemolysate: 418 ng/mL
Folate, RBC: 1072 ng/mL (ref >498)
Hematocrit: 39 % (ref 37.5–51.0)
Iron Saturation: 19 % (ref 15–55)
Iron: 54 ug/dL (ref 38–169)
Retic Ct Pct: 1.5 % (ref 0.6–2.6)
Total Iron Binding Capacity: 285 ug/dL (ref 250–450)
UIBC: 231 ug/dL (ref 111–343)
Vitamin B-12: 422 pg/mL (ref 232–1245)

## 2022-07-29 LAB — LIPID PANEL WITH LDL/HDL RATIO
Cholesterol, Total: 128 mg/dL (ref 100–199)
HDL: 41 mg/dL (ref >39)
LDL Chol Calc (NIH): 69 mg/dL (ref 0–99)
LDL/HDL Ratio: 1.7 ratio (ref 0.0–3.6)
Triglycerides: 96 mg/dL (ref 0–149)
VLDL Cholesterol Cal: 18 mg/dL (ref 5–40)

## 2022-07-29 LAB — HEMOGLOBIN A1C
Est. average glucose Bld gHb Est-mCnc: 177 mg/dL
Hgb A1c MFr Bld: 7.8 % — ABNORMAL HIGH (ref 4.8–5.6)

## 2022-07-29 LAB — INSULIN, RANDOM: INSULIN: 25.6 u[IU]/mL — ABNORMAL HIGH (ref 2.6–24.9)

## 2022-07-29 LAB — VITAMIN D 25 HYDROXY (VIT D DEFICIENCY, FRACTURES): Vit D, 25-Hydroxy: 48 ng/mL (ref 30.0–100.0)

## 2022-07-29 LAB — TSH: TSH: 5.1 u[IU]/mL — ABNORMAL HIGH (ref 0.450–4.500)

## 2022-08-03 NOTE — Progress Notes (Unsigned)
Chief Complaint:   OBESITY Gerald Evans is here to discuss his progress with his obesity treatment plan along with follow-up of his obesity related diagnoses. [name] is on the Category 2 Plan and states he is following his eating plan approximately 60% of the time. Tiara states he is walking 10-15 minutes 3 times per week.  Today's visit was #: 79 Starting weight: 242 lbs Starting date: 03/04/2021 Today's weight: 247 lbs Today's date: 07/23/2022 Total lbs lost to date: 0 lbs Total lbs lost since last in-office visit: 0  Interim History: [name] had a very good holiday season-travel quite a bit and celebrated with quite a few parties.  He mentioned that he has been eating more meat but has had more indulgences over the holiday.  Staying local the for the next few weeks.  Subjective:   1. Type 2 diabetes mellitus with hyperglycemia, with long-term current use of insulin (HCC) Now on insulin 35 units daily and on 0.5 mg of Ozempic.  Minimal carb cravings.  2. Other specified hypothyroidism On Synthroid.  3. Hyperlipidemia associated with type 2 diabetes mellitus ([place]) On Zocor-no side effects noted.  LDL at goal less than 70.  4. Microcytic anemia MCV low for 6 years.  H/H decreased.  Patient has not aware of cause.  5. Vitamin D insufficiency Gerald Evans is currently taking prescription Vit D 50,000 IU once a week.   He notes fatigue.  Assessment/Plan:   1. Type 2 diabetes mellitus with hyperglycemia, with long-term current use of insulin (HCC) We will obtain labs today.  - Comprehensive metabolic panel - Hemoglobin A1c - Insulin, random  -Refill Semaglutide,0.25 or 0.'5MG'$ /DOS, (OZEMPIC, 0.25 OR 0.5 MG/DOSE,) 2 MG/3ML SOPN; Inject 0.5 mg into the skin once a week.  Dispense: 9 mL; Refill: 0  2. Other specified hypothyroidism We will obtain labs today.  - TSH  3. Hyperlipidemia associated with type 2 diabetes mellitus ([place]) We will obtain labs today.  - Lipid Panel With  LDL/HDL Ratio  4. Microcytic anemia We will obtain labs today.  - CBC w/Diff/Platelet - Anemia panel  5. Vitamin D insufficiency We will obtain labs today.  - VITAMIN D 25 Hydroxy (Vit-D Deficiency, Fractures)  -Refill Vitamin D, Ergocalciferol, (DRISDOL) 1.25 MG (50000 UNIT) CAPS capsule; Take 1 capsule (50,000 Units total) by mouth every 7 (seven) days.  Dispense: 4 capsule; Refill: 0  6. Obesity, Current BMI 39.9 Gerald Evans is currently in the action stage of change. As such, his goal is to continue with weight loss efforts. He has agreed to the Category 2 Plan.   Exercise goals: Older adults should follow the adult guidelines. When older adults cannot meet the adult guidelines, they should be as physically active as their abilities and conditions will allow.   Behavioral modification strategies: increasing lean protein intake, meal planning and cooking strategies, keeping healthy foods in the home, and planning for success.  [name] has agreed to follow-up with our clinic in 3 weeks. He was informed of the importance of frequent follow-up visits to maximize his success with intensive lifestyle modifications for his multiple health conditions.   [name] was informed we would discuss his lab results at his next visit unless there is a critical issue that needs to be addressed sooner. [name] agreed to keep his next visit at the agreed upon time to discuss these results.  Objective:   Blood pressure (!) 142/77, pulse 67, temperature 97.6 F (36.4 C), height '5\' 6"'$  (1.676 m), weight 247 lb (112  kg), SpO2 95 %. Body mass index is 39.87 kg/m.  General: Cooperative, alert, well developed, in no acute distress. HEENT: Conjunctivae and lids unremarkable. Cardiovascular: Regular rhythm.  Lungs: Normal work of breathing. Neurologic: No focal deficits.   Lab Results  Component Value Date   CREATININE 1.28 (H) 07/23/2022   BUN 12 07/23/2022   NA 139 07/23/2022   K 3.9 07/23/2022   CL  104 07/23/2022   CO2 20 07/23/2022   Lab Results  Component Value Date   ALT 16 07/23/2022   AST 20 07/23/2022   ALKPHOS 83 07/23/2022   BILITOT 0.3 07/23/2022   Lab Results  Component Value Date   HGBA1C 7.8 (H) 07/23/2022   HGBA1C 7.8 (A) 05/11/2022   HGBA1C 8.6 01/07/2022   HGBA1C 9.7 (A) 10/08/2021   HGBA1C 10.1 07/16/2021   Lab Results  Component Value Date   INSULIN 25.6 (H) 07/23/2022   Lab Results  Component Value Date   TSH 5.100 (H) 07/23/2022   Lab Results  Component Value Date   CHOL 128 07/23/2022   HDL 41 07/23/2022   LDLCALC 69 07/23/2022   TRIG 96 07/23/2022   CHOLHDL 3.4 05/07/2022   Lab Results  Component Value Date   VD25OH 48.0 07/23/2022   VD25OH 45.3 12/01/2021   VD25OH 34.1 03/04/2021   Lab Results  Component Value Date   WBC 7.5 07/23/2022   HGB 12.5 (L) 07/23/2022   HCT 39.0 07/23/2022   MCV 74 (L) 07/23/2022   PLT 218 07/23/2022   Lab Results  Component Value Date   IRON 54 07/23/2022   TIBC 285 07/23/2022   FERRITIN 124 07/23/2022   Attestation Statements:   Reviewed by clinician on day of visit: allergies, medications, problem list, medical history, surgical history, family history, social history, and previous encounter notes.  I, Elnora Morrison, RMA am acting as transcriptionist for Coralie Common, MD.  I have reviewed the above documentation for accuracy and completeness, and I agree with the above. - Coralie Common, MD

## 2022-08-10 ENCOUNTER — Ambulatory Visit: Payer: Medicare HMO | Admitting: Internal Medicine

## 2022-08-13 ENCOUNTER — Other Ambulatory Visit: Payer: Medicare HMO

## 2022-08-13 ENCOUNTER — Ambulatory Visit (INDEPENDENT_AMBULATORY_CARE_PROVIDER_SITE_OTHER): Payer: Medicare HMO | Admitting: Family Medicine

## 2022-08-13 ENCOUNTER — Encounter (INDEPENDENT_AMBULATORY_CARE_PROVIDER_SITE_OTHER): Payer: Self-pay | Admitting: Family Medicine

## 2022-08-13 VITALS — BP 170/82 | HR 66 | Temp 97.6°F | Ht 66.0 in | Wt 243.0 lb

## 2022-08-13 DIAGNOSIS — E559 Vitamin D deficiency, unspecified: Secondary | ICD-10-CM

## 2022-08-13 DIAGNOSIS — Z6839 Body mass index (BMI) 39.0-39.9, adult: Secondary | ICD-10-CM

## 2022-08-13 DIAGNOSIS — E1159 Type 2 diabetes mellitus with other circulatory complications: Secondary | ICD-10-CM | POA: Diagnosis not present

## 2022-08-13 DIAGNOSIS — Z794 Long term (current) use of insulin: Secondary | ICD-10-CM

## 2022-08-13 DIAGNOSIS — E038 Other specified hypothyroidism: Secondary | ICD-10-CM

## 2022-08-13 DIAGNOSIS — I152 Hypertension secondary to endocrine disorders: Secondary | ICD-10-CM

## 2022-08-13 DIAGNOSIS — E669 Obesity, unspecified: Secondary | ICD-10-CM

## 2022-08-13 DIAGNOSIS — E1165 Type 2 diabetes mellitus with hyperglycemia: Secondary | ICD-10-CM

## 2022-08-13 DIAGNOSIS — Z7985 Long-term (current) use of injectable non-insulin antidiabetic drugs: Secondary | ICD-10-CM

## 2022-08-13 MED ORDER — VITAMIN D (ERGOCALCIFEROL) 1.25 MG (50000 UNIT) PO CAPS: 50000.0000 [IU] | ORAL_CAPSULE | ORAL | 0 refills | Status: DC

## 2022-08-13 MED ORDER — LEVOTHYROXINE SODIUM 75 MCG PO TABS: 75.0000 ug | ORAL_TABLET | Freq: Every day | ORAL | 0 refills | Status: DC

## 2022-08-13 NOTE — Progress Notes (Deleted)
Patient has been trying to keep it close to the Cat 2.  He mentions he went to a lecture yesterday which was called Cooking with a Film/video editor at [employer].  He thinks he still needs to work on getting all the food in consistently.  Obstacle is the supper meal and getting everything in there.  Has high protein boost (240 calories and 20g Protein) that he is wondering if he can incorporate.  Next few weeks he does not have any anticipated events, activities or travel.  He has also restarted going to the gym around 4x a week with 30 minutes on the treadmill and the rest of the time lifting weights.  Does not want to make any changes to the meal plan.

## 2022-08-14 ENCOUNTER — Other Ambulatory Visit: Payer: Medicare HMO

## 2022-08-14 DIAGNOSIS — R972 Elevated prostate specific antigen [PSA]: Secondary | ICD-10-CM

## 2022-08-15 LAB — PSA, TOTAL AND FREE
PSA, Free Pct: 18.8 %
PSA, Free: 0.94 ng/mL
Prostate Specific Ag, Serum: 5 ng/mL — ABNORMAL HIGH (ref 0.0–4.0)

## 2022-08-20 ENCOUNTER — Ambulatory Visit (INDEPENDENT_AMBULATORY_CARE_PROVIDER_SITE_OTHER): Payer: Medicare HMO | Admitting: Urology

## 2022-08-20 VITALS — BP 168/83 | HR 73 | Ht 66.0 in | Wt 243.0 lb

## 2022-08-20 DIAGNOSIS — N138 Other obstructive and reflux uropathy: Secondary | ICD-10-CM

## 2022-08-20 DIAGNOSIS — N401 Enlarged prostate with lower urinary tract symptoms: Secondary | ICD-10-CM

## 2022-08-20 DIAGNOSIS — N5201 Erectile dysfunction due to arterial insufficiency: Secondary | ICD-10-CM | POA: Diagnosis not present

## 2022-08-20 DIAGNOSIS — Z87440 Personal history of urinary (tract) infections: Secondary | ICD-10-CM

## 2022-08-20 DIAGNOSIS — R972 Elevated prostate specific antigen [PSA]: Secondary | ICD-10-CM

## 2022-08-20 DIAGNOSIS — R351 Nocturia: Secondary | ICD-10-CM | POA: Diagnosis not present

## 2022-08-20 LAB — URINALYSIS, ROUTINE W REFLEX MICROSCOPIC
Bilirubin, UA: NEGATIVE
Glucose, UA: NEGATIVE
Ketones, UA: NEGATIVE
Leukocytes,UA: NEGATIVE
Nitrite, UA: NEGATIVE
RBC, UA: NEGATIVE
Specific Gravity, UA: 1.025 (ref 1.005–1.030)
Urobilinogen, Ur: 0.2 mg/dL (ref 0.2–1.0)
pH, UA: 5.5 (ref 5.0–7.5)

## 2022-08-20 LAB — MICROSCOPIC EXAMINATION
Bacteria, UA: NONE SEEN
RBC, Urine: NONE SEEN /hpf (ref 0–2)
WBC, UA: NONE SEEN /hpf (ref 0–5)

## 2022-08-20 MED ORDER — TADALAFIL 20 MG PO TABS: ORAL_TABLET | ORAL | 11 refills | Status: DC

## 2022-08-20 NOTE — Progress Notes (Signed)
Subjective: 1. Elevated PSA   2. Erectile dysfunction due to arterial insufficiency   3. BPH with urinary obstruction   4. Nocturia   5. Personal history of urinary infection      08/20/22: Gerald Evans returns today in f/u.  His PSA is stable at 5 with an 18.8% f/t ratio.  He remains on sildenafil for ED with some decline in function. He continues to void well with an IPSS of 2 and nocturia x 1.  He is off of the furosemide.  He has no symptoms to suggest a recurrent UTI.  His UA is clear.   01/15/22: Gerald Evans returns today in f/u for his elevated PSA of 5.  It is down to 4.9 prior to this visit.   His UA is clear.  His IPSS is 4 with nocturia x 2. He is taking an antiacid with the sildenafil which has eliminated the heartburn.   He has been started on furosemide for ankle edema but isn't having too much frequency with that.  He has had prior UTI's but his UA is clear today.   10/23/21: Gerald Evans returns today in f/u for his history of an elevated PSA associated with a UTI.   The PSA was back down to 2.6 after treatment but is back up to 5.0 with an 18.4% f/t ratio prior to this visit.  He has had no recurrent UTI's.  His IPSS today is 5 with nocturia x 2.  He has ED and continues to use sildenafil but he gets heartburn with it.  His UA is unremarkable.  He has an issue with the 3rd finger on the right hand to straightening completely.    10/24/20: Gerald Evans returns today with a repeat PSA which is down to 2.6 with a 26% f/t ratio after completing his antibiotics.   His UA is clear.     GU Hx: Gerald Evans is a 77 yo Gerald Evans who is sent by Dr. Legrand Rams for the evaluation of an elevated PSA of 36 on 08/23/20.  He thinks his PSA was about 3.1 in 2021.  He had been seen in the ER on 2/23 and was found to have a e. Coli UTI.   He was treated with a 7 day course of keflex.  A CT showed changes consistent with cystitis.  He had previously seen Dr. Diona Fanti for ED. His UA is clear today.  He has a little residual  initial dysuria.  He has moderate LUTS with urgency and occasional UUI. He doesn't always feel empty.  He has a good stream.  He has nocturia x 2.   He has had no prior UTI's or GU surgery.   He is a diabetic and has HTN.  ROS:  Review of Systems  Cardiovascular:  Positive for leg swelling.  Skin:  Positive for itching.  All other systems reviewed and are negative.   Allergies  Allergen Reactions   Lisinopril     cough    Past Medical History:  Diagnosis Date   Colon polyps    Diabetes mellitus without complication ([place])    Edema of both lower extremities    Fatty liver    Hypercholesteremia    Hypertension     Past Surgical History:  Procedure Laterality Date   COLONOSCOPY     COLONOSCOPY  04/21/2012   Procedure: COLONOSCOPY;  Surgeon: Rogene Houston, MD;  [facility];  Service: Endoscopy;  Laterality: N/A;  1200   COLONOSCOPY N/A 09/09/2017  Procedure: COLONOSCOPY;  Surgeon: Rogene Houston, MD;  Location: [facility];  Service: Endoscopy;  Laterality: N/A;  1200-rescheduled to 3/14/ '@12'$ :00pm per [place]     POLYPECTOMY  09/09/2017   Procedure: POLYPECTOMY;  Surgeon: Rogene Houston, MD;  Location: [facility];  Service: Endoscopy;;  Transverse Colon ([facility])    Social History   Socioeconomic History   Marital status: Married    Spouse name: Not on file   Number of children: 2   Years of education: Not on file   Highest education level: Not on file  Occupational History   Occupation: retired  Tobacco Use   Smoking status: Former    Packs/day: 0.50    Years: 20.00    Total pack years: 10.00    Types: Cigarettes   Smokeless tobacco: Never  Vaping Use   Vaping Use: Never used  Substance and Sexual Activity   Alcohol use: Yes    Alcohol/week: 4.0 standard drinks of alcohol    Types: 4 Standard drinks or equivalent per week   Drug use: No   Sexual activity: Not on file  Other Topics Concern   Not on file  Social History Narrative    Not on file   Social Determinants of Health   Financial Resource Strain: Not on file  Food Insecurity: Not on file  Transportation Needs: Not on file  Physical Activity: Not on file  Stress: Not on file  Social Connections: Not on file  Intimate Partner Violence: Not on file    Family History  Problem Relation Age of Onset   Hypertension Mother    Hypertension Father    Alcoholism Father    Obesity Father    Diabetes Sister    Hypertension Sister    Healthy Daughter    Healthy Son    Colon cancer Neg Hx     Anti-infectives: Anti-infectives (From admission, onward)    None       Current Outpatient Medications  Medication Sig Dispense Refill   blood glucose meter kit and supplies Dispense based on patient and insurance preference. Use up to four times daily as directed. (FOR ICD-10 E11.65) 1 each 0   glucose blood (ONETOUCH ULTRA) test strip Use 1 strip to check glucose 3 times daily as directed. 300 each 1   Insulin Pen Needle (BD PEN NEEDLE NANO 2ND GEN) 32G X 4 MM MISC 1 Package by Does not apply route 2 (two) times daily. 100 each 0   levothyroxine (SYNTHROID) 75 MCG tablet Take 1 tablet (75 mcg total) by mouth daily before breakfast. 90 tablet 0   losartan (COZAAR) 100 MG tablet Take 100 mg by mouth daily.     metFORMIN (GLUCOPHAGE) 1000 MG tablet Take 1 tablet (1,000 mg total) by mouth daily with breakfast. 90 tablet 3   metoprolol (LOPRESSOR) 100 MG tablet Take 100 mg by mouth 2 (two) times daily.      NOVOLIN 70/30 KWIKPEN (70-30) 100 UNIT/ML KwikPen Inject 50 Units into the skin daily. 90 mL 3   OneTouch Delica Lancets [id number] MISC 1 each by Does not apply route 3 (three) times daily. Use to check blood glucose three times daily 100 each 3   Semaglutide,0.25 or 0.'5MG'$ /DOS, (OZEMPIC, 0.25 OR 0.5 MG/DOSE,) 2 MG/3ML SOPN Inject 0.5 mg into the skin once a week. 9 mL 0   sildenafil (VIAGRA) 100 MG tablet Take 100 mg by mouth daily as needed.     simvastatin (ZOCOR)  20  MG tablet Take 20 mg by mouth daily.     tadalafil (CIALIS) 20 MG tablet Use either 1 tab po daily prn or 1/2 tab po with 1/2 tab sildenafil '100mg'$  po daily prn 6 tablet 11   Vitamin D, Ergocalciferol, (DRISDOL) 1.25 MG (50000 UNIT) CAPS capsule Take 1 capsule (50,000 Units total) by mouth every 7 (seven) days. 4 capsule 0   No current facility-administered medications for this visit.     Objective: Vital signs in last 24 hours: BP (!) 168/83   Pulse 73   Ht '5\' 6"'$  (1.676 m)   Wt 243 lb (110.2 kg)   BMI 39.22 kg/m   Intake/Output from previous day: No intake/output data recorded. Intake/Output this shift: '@IOTHISSHIFT'$ @   Physical Exam  Lab Results:  Recent Results (from the past 2160 hour(s))  Basic metabolic panel     Status: Abnormal   Collection Time: 06/25/22 11:38 AM  Result Value Ref Range   Glucose 168 (H) 70 - 99 mg/dL   BUN 17 8 - 27 mg/dL   Creatinine, Ser 1.18 0.76 - 1.27 mg/dL   eGFR 64 >59 mL/min/1.73   BUN/Creatinine Ratio 14 10 - 24   Sodium 141 134 - 144 mmol/L   Potassium 4.0 3.5 - 5.2 mmol/L   Chloride 105 96 - 106 mmol/L   CO2 21 20 - 29 mmol/L   Calcium 9.7 8.6 - 10.2 mg/dL  Brain natriuretic peptide     Status: Abnormal   Collection Time: 06/25/22 11:38 AM  Result Value Ref Range   BNP 124.7 (H) 0.0 - 100.0 pg/mL    Comment: Siemens ADVIA Centaur XP methodology  CBC with Differential/Platelet     Status: Abnormal   Collection Time: 06/25/22 11:38 AM  Result Value Ref Range   WBC 8.3 3.4 - 10.8 x10E3/uL   RBC 5.64 4.14 - 5.80 x10E6/uL   Hemoglobin 12.9 (L) 13.0 - 17.7 g/dL   Hematocrit 42.1 37.5 - 51.0 %   MCV 75 (L) 79 - 97 fL   MCH 22.9 (L) 26.6 - 33.0 pg   MCHC 30.6 (L) 31.5 - 35.7 g/dL   RDW 17.9 (H) 11.6 - 15.4 %   Platelets 202 150 - 450 x10E3/uL   Neutrophils 51 Not Estab. %   Lymphs 38 Not Estab. %   Monocytes 7 Not Estab. %   Eos 2 Not Estab. %   Basos 1 Not Estab. %   Neutrophils Absolute 4.3 1.4 - 7.0 x10E3/uL   Lymphocytes  Absolute 3.2 (H) 0.7 - 3.1 x10E3/uL   Monocytes Absolute 0.6 0.1 - 0.9 x10E3/uL   EOS (ABSOLUTE) 0.1 0.0 - 0.4 x10E3/uL   Basophils Absolute 0.0 0.0 - 0.2 x10E3/uL   Immature Granulocytes 1 Not Estab. %   Immature Grans (Abs) 0.0 0.0 - 0.1 x10E3/uL  D-dimer, quantitative     Status: Abnormal   Collection Time: 06/25/22 11:38 AM  Result Value Ref Range   D-DIMER 0.96 (H) 0.00 - 0.49 mg/L FEU    Comment: According to the assay manufacturer's published package insert, a normal (<0.50 mg/L FEU) D-dimer result in conjunction with a non-high clinical probability assessment, excludes deep vein thrombosis (DVT) and pulmonary embolism (PE) with high sensitivity. D-dimer values increase with age and this can make VTE exclusion of an older population difficult. To address this, the [employer], based on best available evidence and recent guidelines, recommends that clinicians use age-adjusted D-dimer thresholds in patients greater than 50 years of  age with: a) a low probability of PE who do not meet all Pulmonary Embolism Rule Out Criteria, or b) in those with intermediate probability of PE. The formula for an age-adjusted D-dimer cut-off is "age/100". For example, a 76 year old patient would have an age-adjusted cut-off of 0.60 mg/L FEU and an 76 year old 0.80 mg/L FEU.   Comprehensive metabolic panel     Status: Abnormal   Collection Time: 07/23/22  9:21 AM  Result Value Ref Range   Glucose 100 (H) 70 - 99 mg/dL   BUN 12 8 - 27 mg/dL   Creatinine, Ser 1.28 (H) 0.76 - 1.27 mg/dL   eGFR 58 (L) >59 mL/min/1.73   BUN/Creatinine Ratio 9 (L) 10 - 24   Sodium 139 134 - 144 mmol/L   Potassium 3.9 3.5 - 5.2 mmol/L   Chloride 104 96 - 106 mmol/L   CO2 20 20 - 29 mmol/L   Calcium 9.2 8.6 - 10.2 mg/dL   Total Protein 6.5 6.0 - 8.5 g/dL   Albumin 4.2 3.8 - 4.8 g/dL   Globulin, Total 2.3 1.5 - 4.5 g/dL   Albumin/Globulin Ratio 1.8 1.2 - 2.2   Bilirubin Total 0.3 0.0 - 1.2 mg/dL    Alkaline Phosphatase 83 44 - 121 IU/L   AST 20 0 - 40 IU/L   ALT 16 0 - 44 IU/L  Hemoglobin A1c     Status: Abnormal   Collection Time: 07/23/22  9:21 AM  Result Value Ref Range   Hgb A1c MFr Bld 7.8 (H) 4.8 - 5.6 %    Comment:          Prediabetes: 5.7 - 6.4          Diabetes: >6.4          Glycemic control for adults with diabetes: <7.0    Est. average glucose Bld gHb Est-mCnc 177 mg/dL  Insulin, random     Status: Abnormal   Collection Time: 07/23/22  9:21 AM  Result Value Ref Range   INSULIN 25.6 (H) 2.6 - 24.9 uIU/mL  Lipid Panel With LDL/HDL Ratio     Status: None   Collection Time: 07/23/22  9:21 AM  Result Value Ref Range   Cholesterol, Total 128 100 - 199 mg/dL   Triglycerides 96 0 - 149 mg/dL   HDL 41 >39 mg/dL   VLDL Cholesterol Cal 18 5 - 40 mg/dL   LDL Chol Calc (NIH) 69 0 - 99 mg/dL   LDL/HDL Ratio 1.7 0.0 - 3.6 ratio    Comment:                                     LDL/HDL Ratio                                             Men  Women                               1/2 Avg.Risk  1.0    1.5                                   Avg.Risk  3.6  3.2                                2X Avg.Risk  6.2    5.0                                3X Avg.Risk  8.0    6.1   VITAMIN D 25 Hydroxy (Vit-D Deficiency, Fractures)     Status: None   Collection Time: 07/23/22  9:21 AM  Result Value Ref Range   Vit D, 25-Hydroxy 48.0 30.0 - 100.0 ng/mL    Comment: Vitamin D deficiency has been defined by the [place] practice guideline as a level of serum 25-OH vitamin D less than 20 ng/mL (1,2). The Endocrine Society went on to further define vitamin D insufficiency as a level between 21 and 29 ng/mL (2). 1. IOM (Institute of Medicine). 2010. Dietary reference    intakes for calcium and D. [place]: The    [employer]. 2. Holick MF, Binkley Ruth, Bischoff-Ferrari HA, et al.    Evaluation, treatment, and prevention of vitamin D     deficiency: an Endocrine Society clinical practice    guideline. JCEM. 2011 Jul; 96(7):1911-30.   TSH     Status: Abnormal   Collection Time: 07/23/22  9:21 AM  Result Value Ref Range   TSH 5.100 (H) 0.450 - 4.500 uIU/mL  Anemia panel     Status: None   Collection Time: 07/23/22  9:21 AM  Result Value Ref Range   Total Iron Binding Capacity 285 250 - 450 ug/dL   UIBC 231 111 - 343 ug/dL   Iron 54 38 - 169 ug/dL   Iron Saturation 19 15 - 55 %   Vitamin B-12 422 232 - 1,245 pg/mL   Folate, Hemolysate 418.0 Not Estab. ng/mL   Hematocrit 39.0 37.5 - 51.0 %   Folate, RBC 1,072 >498 ng/mL   Ferritin 124 30 - 400 ng/mL   Retic Ct Pct 1.5 0.6 - 2.6 %  CBC with Differential/Platelet     Status: Abnormal   Collection Time: 07/23/22  9:21 AM  Result Value Ref Range   WBC 7.5 3.4 - 10.8 x10E3/uL   RBC 5.26 4.14 - 5.80 x10E6/uL   Hemoglobin 12.5 (L) 13.0 - 17.7 g/dL   MCV 74 (L) 79 - 97 fL   MCH 23.8 (L) 26.6 - 33.0 pg   MCHC 32.1 31.5 - 35.7 g/dL   RDW 18.3 (H) 11.6 - 15.4 %   Platelets 218 150 - 450 x10E3/uL   Neutrophils 42 Not Estab. %   Lymphs 47 Not Estab. %   Monocytes 7 Not Estab. %   Eos 4 Not Estab. %   Basos 0 Not Estab. %   Neutrophils Absolute 3.2 1.4 - 7.0 x10E3/uL   Lymphocytes Absolute 3.5 (H) 0.7 - 3.1 x10E3/uL   Monocytes Absolute 0.6 0.1 - 0.9 x10E3/uL   EOS (ABSOLUTE) 0.3 0.0 - 0.4 x10E3/uL   Basophils Absolute 0.0 0.0 - 0.2 x10E3/uL   Immature Granulocytes 0 Not Estab. %   Immature Grans (Abs) 0.0 0.0 - 0.1 x10E3/uL  PSA, total and free     Status: Abnormal   Collection Time: 08/14/22 10:23 AM  Result Value Ref Range   Prostate Specific Ag, Serum 5.0 (H) 0.0 - 4.0 ng/mL  Comment: Roche ECLIA methodology. According to the American Urological Association, Serum PSA should decrease and remain at undetectable levels after radical prostatectomy. The AUA defines biochemical recurrence as an initial PSA value 0.2 ng/mL or greater followed by a subsequent  confirmatory PSA value 0.2 ng/mL or greater. Values obtained with different assay methods or kits cannot be used interchangeably. Results cannot be interpreted as absolute evidence of the presence or absence of malignant disease.    PSA, Free 0.94 N/A ng/mL    Comment: Roche ECLIA methodology.   PSA, Free Pct 18.8 %    Comment: The table below lists the probability of prostate cancer for men with non-suspicious DRE results and total PSA between 4 and 10 ng/mL, by patient age Ricci Barker, [place], [id number]).                   % Free PSA       50-64 yr        65-75 yr                   0.00-10.00%        56%             55%                  10.01-15.00%        24%             35%                  15.01-20.00%        17%             23%                  20.01-25.00%        10%             20%                       >25.00%         5%              9% Please note:  [place] et al did not make specific               recommendations regarding the use of               percent free PSA for any other population               of men.   Urinalysis, Routine w reflex microscopic     Status: Abnormal   Collection Time: 08/20/22 10:43 AM  Result Value Ref Range   Specific Gravity, UA 1.025 1.005 - 1.030   pH, UA 5.5 5.0 - 7.5   Color, UA Yellow Yellow   Appearance Ur Clear Clear   Leukocytes,UA Negative Negative   Protein,UA 2+ (A) Negative/Trace   Glucose, UA Negative Negative   Ketones, UA Negative Negative   RBC, UA Negative Negative   Bilirubin, UA Negative Negative   Urobilinogen, Ur 0.2 0.2 - 1.0 mg/dL   Nitrite, UA Negative Negative   Microscopic Examination See below:     Comment: Microscopic was indicated and was performed.  Microscopic Examination     Status: Abnormal   Collection Time: 08/20/22 10:43 AM   Urine  Result Value Ref Range   WBC, UA None seen 0 - 5 /hpf   RBC, Urine None  seen 0 - 2 /hpf   Epithelial Cells (non renal) 0-10 0 - 10 /hpf   Casts Present (A)  None seen /lpf   Cast Type Hyaline casts N/A   Bacteria, UA None seen None seen/Few   UA is clear.  Studies/Results: No results found.   Assessment/Plan: Elevated PSA.  His PSA is stable  I will have him return in 6 months for a repeat.   BPH with BOO.  He only has mild LUTS.   History of UTI/Prostatitis.  UA is clear.   ED.  He is not responding as well to sildenafil so I will send tadalafil '20mg'$  to use either alone or in low dose combination with sildenafil.     Meds ordered this encounter  Medications   tadalafil (CIALIS) 20 MG tablet    Sig: Use either 1 tab po daily prn or 1/2 tab po with 1/2 tab sildenafil '100mg'$  po daily prn    Dispense:  6 tablet    Refill:  11     Orders Placed This Encounter  Procedures   Microscopic Examination   Urinalysis, Routine w reflex microscopic   PSA, total and free    Standing Status:   Future    Standing Expiration Date:   08/21/2023     Return in about 6 months (around 02/18/2023) for with a PSA.    CC: Dr. Rosita Fire.      Irine Seal 08/21/2022 [id number] ID: Gerald Evans, Gerald Evans   DOB: [birth year], 77 y.o.   MRN: [id number]

## 2022-08-21 ENCOUNTER — Encounter: Payer: Self-pay | Admitting: Urology

## 2022-08-24 NOTE — Progress Notes (Signed)
Chief Complaint:   OBESITY Gerald Evans is here to discuss his progress with his obesity treatment plan along with follow-up of his obesity related diagnoses. Gerald Evans is on the Category 2 Plan and states he is following his eating plan approximately 70% of the time. Gerald Evans states he is going to gym 30-45 minutes 4 times per week.  Today's visit was #: 20 Starting weight: 242 lbs Starting date: 03/04/2021 Today's weight: 243 lbs Today's date: 08/13/2022 Total lbs lost to date: 0 lbs Total lbs lost since last in-office visit: 4  Interim History: Gerald Evans has been trying to keep it close to the Cat 2.  He mentions he went to a lecture yesterday which was called Cooking with a Film/video editor at [employer].  He thinks he still needs to work on getting all the food in consistently.  Obstacle is the supper meal and getting everything in there.  Has high protein boost (240 calories and 20g Protein) that he is wondering if he can incorporate.  Next few weeks he does not have any anticipated events, activities or travel.  He has also restarted going to the gym around 4x a week with 30 minutes on the treadmill and the rest of the time lifting weights.  Does not want to make any changes to the meal plan.   Subjective:   1. Hypertension associated with diabetes ([place]) Blood pressure very elevated today but patient reports he did not take medication.  Denies chest pain, chest pressure and headache.  2. Vitamin D insufficiency Labs discussed during visit today.  Vitamin D level of 48, still not at goal.  Notes fatigue.  3. Other specified hypothyroidism On Synthroid 50 mcg.  Recent TSH of 5.1.  4. Type 2 diabetes mellitus with hyperglycemia, with long-term current use of insulin ([facility]) On 35 units of 70-30 insulin and 0.5 mg of Ozempic.  Recent A1c at 7.8.  Assessment/Plan:   1. Hypertension associated with diabetes ([place]) Will continue current medication.  Patient encouraged to bring blood pressure log  to next appointment and check blood pressure 2-3 times a week.  2. Vitamin D insufficiency We will refill Vit D 50K IU once a week for 1 month with 0 refills.  -Refill Vitamin D, Ergocalciferol, (DRISDOL) 1.25 MG (50000 UNIT) CAPS capsule; Take 1 capsule (50,000 Units total) by mouth every 7 (seven) days.  Dispense: 4 capsule; Refill: 0  3. Other specified hypothyroidism Will increase Levothyroxine to 75 mcg daily for 3 months with 0 refills.  -Refill levothyroxine (SYNTHROID) 75 MCG tablet; Take 1 tablet (75 mcg total) by mouth daily before breakfast.  Dispense: 90 tablet; Refill: 0  4. Type 2 diabetes mellitus with hyperglycemia, with long-term current use of insulin ([place]) Will continue Ozempic without changes in medication.  5. BMI 39.0-39.9,adult  6. Obesity with starting BMI of 36.8 Gerald Evans is currently in the action stage of change. As such, his goal is to continue with weight loss efforts. He has agreed to the Category 2 Plan.   Exercise goals: As is.  Behavioral modification strategies: increasing lean protein intake, meal planning and cooking strategies, keeping healthy foods in the home, and planning for success.  Gerald Evans has agreed to follow-up with our clinic in 4 weeks. He was informed of the importance of frequent follow-up visits to maximize his success with intensive lifestyle modifications for his multiple health conditions.   Objective:   Blood pressure (!) 170/82, pulse 66, temperature 97.6 F (36.4 C), height '5\' 6"'$  (1.676 m),  weight 243 lb (110.2 kg), SpO2 96 %. Body mass index is 39.22 kg/m.  General: Cooperative, alert, well developed, in no acute distress. HEENT: Conjunctivae and lids unremarkable. Cardiovascular: Regular rhythm.  Lungs: Normal work of breathing. Neurologic: No focal deficits.   Lab Results  Component Value Date   CREATININE 1.28 (H) 07/23/2022   BUN 12 07/23/2022   NA 139 07/23/2022   K 3.9 07/23/2022   CL 104 07/23/2022   CO2 20  07/23/2022   Lab Results  Component Value Date   ALT 16 07/23/2022   AST 20 07/23/2022   ALKPHOS 83 07/23/2022   BILITOT 0.3 07/23/2022   Lab Results  Component Value Date   HGBA1C 7.8 (H) 07/23/2022   HGBA1C 7.8 (A) 05/11/2022   HGBA1C 8.6 01/07/2022   HGBA1C 9.7 (A) 10/08/2021   HGBA1C 10.1 07/16/2021   Lab Results  Component Value Date   INSULIN 25.6 (H) 07/23/2022   Lab Results  Component Value Date   TSH 5.100 (H) 07/23/2022   Lab Results  Component Value Date   CHOL 128 07/23/2022   HDL 41 07/23/2022   LDLCALC 69 07/23/2022   TRIG 96 07/23/2022   CHOLHDL 3.4 05/07/2022   Lab Results  Component Value Date   VD25OH 48.0 07/23/2022   VD25OH 45.3 12/01/2021   VD25OH 34.1 03/04/2021   Lab Results  Component Value Date   WBC 7.5 07/23/2022   HGB 12.5 (L) 07/23/2022   HCT 39.0 07/23/2022   MCV 74 (L) 07/23/2022   PLT 218 07/23/2022   Lab Results  Component Value Date   IRON 54 07/23/2022   TIBC 285 07/23/2022   FERRITIN 124 07/23/2022   Attestation Statements:   Reviewed by clinician on day of visit: allergies, medications, problem list, medical history, surgical history, family history, social history, and previous encounter notes.  I, Elnora Morrison, RMA am acting as transcriptionist for Coralie Common, MD.  I have reviewed the above documentation for accuracy and completeness, and I agree with the above. - Coralie Common, MD

## 2022-09-07 ENCOUNTER — Ambulatory Visit (INDEPENDENT_AMBULATORY_CARE_PROVIDER_SITE_OTHER): Payer: Medicare HMO | Admitting: Internal Medicine

## 2022-09-07 ENCOUNTER — Encounter (INDEPENDENT_AMBULATORY_CARE_PROVIDER_SITE_OTHER): Payer: Self-pay | Admitting: Internal Medicine

## 2022-09-07 VITALS — BP 147/79 | HR 68 | Temp 97.9°F | Ht 66.0 in | Wt 244.0 lb

## 2022-09-07 DIAGNOSIS — E1159 Type 2 diabetes mellitus with other circulatory complications: Secondary | ICD-10-CM

## 2022-09-07 DIAGNOSIS — E1165 Type 2 diabetes mellitus with hyperglycemia: Secondary | ICD-10-CM

## 2022-09-07 DIAGNOSIS — E669 Obesity, unspecified: Secondary | ICD-10-CM | POA: Diagnosis not present

## 2022-09-07 DIAGNOSIS — I152 Hypertension secondary to endocrine disorders: Secondary | ICD-10-CM | POA: Insufficient documentation

## 2022-09-07 DIAGNOSIS — Z7985 Long-term (current) use of injectable non-insulin antidiabetic drugs: Secondary | ICD-10-CM

## 2022-09-07 DIAGNOSIS — Z794 Long term (current) use of insulin: Secondary | ICD-10-CM

## 2022-09-07 DIAGNOSIS — Z6836 Body mass index (BMI) 36.0-36.9, adult: Secondary | ICD-10-CM

## 2022-09-07 NOTE — Assessment & Plan Note (Signed)
On insulin and ozempic, followed by Endo. 145, highest 160. Has cut down on insulin to 35 units.  His Ozempic is being titrated patient advised to monitor for symptoms of low blood sugar which may be an indication to reduce his mixed insulin.  He may benefit to transitioning to basal insulin instead.  He will continue to work with his endocrinologist.  Counseled on reducing simple and added sugars in his diet and increasing fiber and vegetables.

## 2022-09-07 NOTE — Progress Notes (Unsigned)
Office: [phone]  /  Fax: [phone]  WEIGHT SUMMARY AND BIOMETRICS  Vitals Temp: 97.9 F (36.6 C) BP: (!) 147/79 Pulse Rate: 68 SpO2: 97 %   Anthropometric Measurements Height: '5\' 6"'$  (1.676 m) Weight: 244 lb (110.7 kg) BMI (Calculated): 39.4 Weight at Last Visit: 243 lb Weight Lost Since Last Visit: +1 Starting Weight: 242 lb Total Weight Loss (lbs): 0 lb (0 kg) Peak Weight: 257 lb   Body Composition  Body Fat %: 38 % Fat Mass (lbs): 93 lbs Muscle Mass (lbs): 144 lbs Total Body Water (lbs): 110.2 lbs Visceral Fat Rating : 26    HPI  Chief Complaint: OBESITY  Gerald Evans is here to discuss his progress with his obesity treatment plan. He is on the the Category 3 Plan and states he is following his eating plan approximately 65 % of the time. He states he is exercising 50 minutes 3 times per week.  Interval History:  This is my first encounter with Gerald Evans.  Since last office visit he has gained one pound. He reports suboptimal adherence due to eating fatty sources of protein and diet is low on fruits and vegetables.  He is also been eating processed carbs as primary source of carbs. '[x]'$ Denies '[]'$ Reports problems with appetite and hunger signals.  '[x]'$ Denies '[]'$ Reports problems with satiety and satiation.  '[x]'$ Denies '[]'$ Reports problems with eating patterns and portion control.  '[x]'$ Denies '[]'$ Reports abnormal cravings Barriers identified inability to cook healthy meals, inability do do meal prep and planning, and inability to focus on healthy eating.   24 hr recall: 2 eggs, 2 slices or pork bacon, a bowl of oatmeal. Lunch: left over chicken, thigh with skin, Dinner: 4 pieces of pork ribs and grits, some tomato and lettuce with tea with sucralose. One bag of popcorn. Wife cooks. Some shots of liquor.   Pharmacotherapy for weight loss: He is currently taking Ozempic with diabetes as the primary indication.   Weight promoting medications identified: Beta-blockers and  Obesogenic diabetes medications.  ASSESSMENT AND PLAN  TREATMENT PLAN FOR OBESITY:  Recommended Dietary Goals  Gerald Evans is currently in the action stage of change. As such, his goal is to continue weight management plan. He has agreed to: the Category 3 Plan.  Behavioral Intervention  We discussed the following Behavioral Modification Strategies today: increasing lean protein intake, decreasing simple carbohydrates , increasing vegetables, increasing lower glycemic fruits, increasing water intake, identifying sources and decreasing liquid calories, work on meal planning and easy cooking plans, decreasing eating out, consumption of processed foods, and making healthy choices when eating convenient foods, and reading food labels .  Additional resources provided today: Handout on healthy eating and balanced plate and Handout on complex carbohydrates and lean sources of protein  Recommended Physical Activity Goals  Gerald Evans has been advised to work up to 150 minutes of moderate intensity aerobic activity a week and strengthening exercises 2-3 times per week for cardiovascular health, weight loss maintenance and preservation of muscle mass.   He has agreed to :  '[]'$  Continue current level of physical activity  '[x]'$  Think about ways to increase physical activity '[]'$  Start strengthening exercises with a goal of 2-3 sessions a week  '[]'$  Start aerobic activity with a goal of 150 minutes a week at moderate intensity.  '[]'$  Increase the intensity, frequency or duration of strengthening exercises  '[]'$  Increase the intensity, frequency or duration of aerobic exercises   '[]'$  Increase physical activity in their day and reduce sedentary time (increase NEAT). '[]'$   Work on scheduling and tracking physical activity.   Pharmacotherapy We discussed various medication options to help Gerald Evans with his weight loss efforts and we both agreed to : continue current anti-obesity medication regimen  ASSOCIATED CONDITIONS  ADDRESSED TODAY  Hypertension associated with diabetes [facility]) Assessment & Plan: Uncontrolled.  He would benefit from medication reconciliation as I suspect adherence may be suboptimal.  Last office visit his blood pressure was again elevated.  I advised patient to monitor his blood pressure at home in the morning and before bedtime to look at trends.  He will follow-up with PCP for medication intensification.   Type 2 diabetes mellitus with hyperglycemia, with long-term current use of insulin (Gerald Evans) Assessment & Plan: On insulin and ozempic, followed by Endo. 145, highest 160. Has cut down on insulin to 35 units.  His Ozempic is being titrated patient advised to monitor for symptoms of low blood sugar which may be an indication to reduce his mixed insulin.  He may benefit to transitioning to basal insulin instead.  He will continue to work with his endocrinologist.  Counseled on reducing simple and added sugars in his diet and increasing fiber and vegetables.   Obesity, current BMI 36     PHYSICAL EXAM:  Blood pressure (!) 147/79, pulse 68, temperature 97.9 F (36.6 C), height '5\' 6"'$  (1.676 m), weight 244 lb (110.7 kg), SpO2 97 %. Body mass index is 39.38 kg/m.  General: He is overweight, cooperative, alert, well developed, and in no acute distress. PSYCH: Has normal mood, affect and thought process.   HEENT: EOMI, sclerae are anicteric. Lungs: Normal breathing effort, no conversational dyspnea. Extremities: No edema.  Neurologic: No gross sensory or motor deficits. No tremors or fasciculations noted.    DIAGNOSTIC DATA REVIEWED:  BMET    Component Value Date/Time   NA 139 07/23/2022 0921   K 3.9 07/23/2022 0921   CL 104 07/23/2022 0921   CO2 20 07/23/2022 0921   GLUCOSE 100 (H) 07/23/2022 0921   GLUCOSE 234 (H) 08/21/2020 1437   BUN 12 07/23/2022 0921   CREATININE 1.28 (H) 07/23/2022 0921   CREATININE 1.00 03/18/2020 1321   CALCIUM 9.2 07/23/2022 0921   GFRNONAA >60  08/21/2020 1437   GFRNONAA 74 03/18/2020 1321   GFRAA 67 07/17/2020 0953   GFRAA 86 03/18/2020 1321   Lab Results  Component Value Date   HGBA1C 7.8 (H) 07/23/2022   HGBA1C 8.5 10/10/2019   Lab Results  Component Value Date   INSULIN 25.6 (H) 07/23/2022   Lab Results  Component Value Date   TSH 5.100 (H) 07/23/2022   CBC    Component Value Date/Time   WBC 7.5 07/23/2022 0921   WBC 13.6 (H) 08/21/2020 1437   RBC 5.26 07/23/2022 0921   RBC 4.85 08/21/2020 1437   HGB 12.5 (L) 07/23/2022 0921   HCT 39.0 07/23/2022 0921   PLT 218 07/23/2022 0921   MCV 74 (L) 07/23/2022 0921   MCH 23.8 (L) 07/23/2022 0921   MCH 24.3 (L) 08/21/2020 1437   MCHC 32.1 07/23/2022 0921   MCHC 33.1 08/21/2020 1437   RDW 18.3 (H) 07/23/2022 0921   Iron Studies    Component Value Date/Time   IRON 54 07/23/2022 0921   TIBC 285 07/23/2022 0921   FERRITIN 124 07/23/2022 0921   IRONPCTSAT 19 07/23/2022 0921   IRONPCTSAT 24 05/25/2016 0814   Lipid Panel     Component Value Date/Time   CHOL 128 07/23/2022 0921   CHOL 163 05/11/2022  1217   TRIG 96 07/23/2022 0921   TRIG 125 05/11/2022 1217   HDL 41 07/23/2022 0921   CHOLHDL 3.4 05/07/2022 1053   CHOLHDL 3.1 03/18/2020 1321   LDLCALC 69 07/23/2022 0921   LDLCALC 74 03/18/2020 1321   Hepatic Function Panel     Component Value Date/Time   PROT 6.5 07/23/2022 0921   ALBUMIN 4.2 07/23/2022 0921   AST 20 07/23/2022 0921   ALT 16 07/23/2022 0921   ALKPHOS 83 07/23/2022 0921   BILITOT 0.3 07/23/2022 0921   BILIDIR 0.1 05/14/2017 1333   IBILI 0.2 05/14/2017 1333      Component Value Date/Time   TSH 5.100 (H) 07/23/2022 0921   Nutritional Lab Results  Component Value Date   VD25OH 48.0 07/23/2022   VD25OH 45.3 12/01/2021   VD25OH 34.1 03/04/2021     Return in about 4 weeks (around 10/05/2022) for Dr. Jearld Shines.. He was informed of the importance of frequent follow up visits to maximize his success with intensive lifestyle modifications  for his multiple health conditions.   ATTESTASTION STATEMENTS:  Reviewed by clinician on day of visit: allergies, medications, problem list, medical history, surgical history, family history, social history, and previous encounter notes.   Time spent on visit including pre-visit chart review and post-visit care and charting was 30 minutes.    Thomes Dinning, MD

## 2022-09-08 NOTE — Assessment & Plan Note (Signed)
Uncontrolled.  He would benefit from medication reconciliation as I suspect adherence may be suboptimal.  Last office visit his blood pressure was again elevated.  I advised patient to monitor his blood pressure at home in the morning and before bedtime to look at trends.  He will follow-up with PCP for medication intensification.

## 2022-09-09 ENCOUNTER — Encounter: Payer: Self-pay | Admitting: Nurse Practitioner

## 2022-09-09 ENCOUNTER — Ambulatory Visit: Payer: Medicare HMO | Admitting: Nurse Practitioner

## 2022-09-09 VITALS — BP 141/79 | HR 61 | Ht 66.0 in | Wt 249.4 lb

## 2022-09-09 DIAGNOSIS — I10 Essential (primary) hypertension: Secondary | ICD-10-CM

## 2022-09-09 DIAGNOSIS — E1165 Type 2 diabetes mellitus with hyperglycemia: Secondary | ICD-10-CM | POA: Diagnosis not present

## 2022-09-09 DIAGNOSIS — E782 Mixed hyperlipidemia: Secondary | ICD-10-CM

## 2022-09-09 DIAGNOSIS — E039 Hypothyroidism, unspecified: Secondary | ICD-10-CM

## 2022-09-09 DIAGNOSIS — Z794 Long term (current) use of insulin: Secondary | ICD-10-CM

## 2022-09-09 MED ORDER — SEMAGLUTIDE (1 MG/DOSE) 4 MG/3ML ~~LOC~~ SOPN: 1.0000 mg | PEN_INJECTOR | SUBCUTANEOUS | 3 refills | Status: DC

## 2022-09-09 MED ORDER — NOVOLIN 70/30 FLEXPEN (70-30) 100 UNIT/ML ~~LOC~~ SUPN: 25.0000 [IU] | PEN_INJECTOR | Freq: Two times a day (BID) | SUBCUTANEOUS | 3 refills | Status: DC

## 2022-09-09 NOTE — Progress Notes (Signed)
09/09/2022, 11:56 AM  Endocrinology follow-up note   Subjective:    Patient ID: Gerald Evans, male    DOB: [birth year].  Gerald Evans is being seen in follow-up after he was seen in consultation for management of currently uncontrolled symptomatic diabetes requested by  Carrolyn Meiers, MD.   Past Medical History:  Diagnosis Date   Colon polyps    Diabetes mellitus without complication ([place])    Edema of both lower extremities    Fatty liver    Hypercholesteremia    Hypertension     Past Surgical History:  Procedure Laterality Date   COLONOSCOPY     COLONOSCOPY  04/21/2012   Procedure: COLONOSCOPY;  Surgeon: Rogene Houston, MD;  [facility]: [facility];  Service: Endoscopy;  Laterality: N/A;  1200   COLONOSCOPY N/A 09/09/2017   Procedure: COLONOSCOPY;  Surgeon: Rogene Houston, MD;  Location: [facility];  Service: Endoscopy;  Laterality: N/A;  1200-rescheduled to 3/14/ '@12'$ :00pm per [place]     POLYPECTOMY  09/09/2017   Procedure: POLYPECTOMY;  Surgeon: Rogene Houston, MD;  Location: [facility];  Service: Endoscopy;;  Transverse Colon ([facility])    Social History   Socioeconomic History   Marital status: Married    Spouse name: Not on file   Number of children: 2   Years of education: Not on file   Highest education level: Not on file  Occupational History   Occupation: retired  Tobacco Use   Smoking status: Former    Packs/day: 0.50    Years: 20.00    Total pack years: 10.00    Types: Cigarettes   Smokeless tobacco: Never  Vaping Use   Vaping Use: Never used  Substance and Sexual Activity   Alcohol use: Yes    Alcohol/week: 4.0 standard drinks of alcohol    Types: 4 Standard drinks or equivalent per week   Drug use: No   Sexual activity: Not on file  Other Topics Concern   Not on file  Social History Narrative   Not on file   Social Determinants of  Health   Financial Resource Strain: Not on file  Food Insecurity: Not on file  Transportation Needs: Not on file  Physical Activity: Not on file  Stress: Not on file  Social Connections: Not on file    Family History  Problem Relation Age of Onset   Hypertension Mother    Hypertension Father    Alcoholism Father    Obesity Father    Diabetes Sister    Hypertension Sister    Healthy Daughter    Healthy Son    Colon cancer Neg Hx     Outpatient Encounter Medications as of 09/09/2022  Medication Sig   blood glucose meter kit and supplies Dispense based on patient and insurance preference. Use up to four times daily as directed. (FOR ICD-10 E11.65)   glucose blood (ONETOUCH ULTRA) test strip Use 1 strip to check glucose 3 times daily as directed.   Insulin Pen Needle (BD PEN NEEDLE NANO 2ND GEN) 32G X 4 MM MISC 1 Package by Does not apply  route 2 (two) times daily.   levothyroxine (SYNTHROID) 75 MCG tablet Take 1 tablet (75 mcg total) by mouth daily before breakfast.   losartan (COZAAR) 100 MG tablet Take 100 mg by mouth daily.   metFORMIN (GLUCOPHAGE) 1000 MG tablet Take 1 tablet (1,000 mg total) by mouth daily with breakfast.   metoprolol (LOPRESSOR) 100 MG tablet Take 100 mg by mouth 2 (two) times daily.    OneTouch Delica Lancets [id number] MISC 1 each by Does not apply route 3 (three) times daily. Use to check blood glucose three times daily   Semaglutide, 1 MG/DOSE, 4 MG/3ML SOPN Inject 1 mg as directed once a week.   sildenafil (VIAGRA) 100 MG tablet Take 100 mg by mouth daily as needed.   simvastatin (ZOCOR) 20 MG tablet Take 20 mg by mouth daily.   tadalafil (CIALIS) 20 MG tablet Use either 1 tab po daily prn or 1/2 tab po with 1/2 tab sildenafil '100mg'$  po daily prn   Vitamin D, Ergocalciferol, (DRISDOL) 1.25 MG (50000 UNIT) CAPS capsule Take 1 capsule (50,000 Units total) by mouth every 7 (seven) days.   [DISCONTINUED] NOVOLIN 70/30 KWIKPEN (70-30) 100 UNIT/ML KwikPen Inject 50  Units into the skin daily.   [DISCONTINUED] Semaglutide,0.25 or 0.'5MG'$ /DOS, (OZEMPIC, 0.25 OR 0.5 MG/DOSE,) 2 MG/3ML SOPN Inject 0.5 mg into the skin once a week.   NOVOLIN 70/30 KWIKPEN (70-30) 100 UNIT/ML KwikPen Inject 25 Units into the skin in the morning and at bedtime.   No facility-administered encounter medications on file as of 09/09/2022.    ALLERGIES: Allergies  Allergen Reactions   Lisinopril     cough    VACCINATION STATUS: Immunization History  Administered Date(s) Administered   PFIZER Comirnaty(Gray Top)Covid-19 Tri-Sucrose Vaccine 11/12/2020   PFIZER(Purple Top)SARS-COV-2 Vaccination 08/20/2019, 09/12/2019, 04/25/2020   Pfizer Covid-19 Vaccine Bivalent Booster 76yr & up 07/17/2021    Diabetes He presents for his follow-up diabetic visit. He has type 2 diabetes mellitus. Onset time: He was diagnosed at approximate age of 77years. His disease course has been improving. There are no hypoglycemic associated symptoms. Pertinent negatives for hypoglycemia include no confusion, headaches, nervousness/anxiousness, pallor or seizures. Associated symptoms include fatigue. Pertinent negatives for diabetes include no chest pain, no polydipsia, no polyphagia, no polyuria, no weakness and no weight loss. There are no hypoglycemic complications. Symptoms are stable. Diabetic complications include heart disease, impotence and nephropathy. Risk factors for coronary artery disease include dyslipidemia, diabetes mellitus, family history, tobacco exposure, sedentary lifestyle, male sex, obesity and hypertension. Current diabetic treatment includes oral agent (monotherapy) and insulin injections (PCP recently started him on Ozempic but he could not afford it). He is compliant with treatment most of the time. His weight is fluctuating minimally. He is following a generally unhealthy diet. When asked about meal planning, he reported none. He has not had a previous visit with a dietitian. He  participates in exercise three times a week. His home blood glucose trend is decreasing steadily. His overall blood glucose range is 140-180 mg/dl. (He presents today with his meter, no logs, showing improved glycemic profile.  His previsit A1c, checked at healthy weight and wellness on 1/25 was 7.8%, unchanged from previous visit.  He did decrease his insulin to 35 units due to drops in glucose.  He notes when he first started Ozempic he had diarrhea and gas but has improved.  Analysis of his meter shows 7-day average of 144, 14-day average of 146, 30-day average of 146.) An ACE inhibitor/angiotensin II receptor  blocker is being taken. He does not see a podiatrist.Eye exam is current.  Hyperlipidemia This is a chronic problem. The current episode started more than 1 year ago. The problem is controlled. Recent lipid tests were reviewed and are normal. Exacerbating diseases include chronic renal disease, diabetes and obesity. Factors aggravating his hyperlipidemia include fatty foods, beta blockers and thiazides. Pertinent negatives include no chest pain, myalgias or shortness of breath. Current antihyperlipidemic treatment includes statins. The current treatment provides mild improvement of lipids. Compliance problems include adherence to diet and adherence to exercise.  Risk factors for coronary artery disease include diabetes mellitus, dyslipidemia, hypertension, male sex, obesity, a sedentary lifestyle and family history.  Hypertension This is a chronic problem. The current episode started more than 1 year ago. The problem has been waxing and waning since onset. The problem is uncontrolled. Pertinent negatives include no chest pain, headaches, neck pain, palpitations or shortness of breath. There are no associated agents to hypertension. Risk factors for coronary artery disease include dyslipidemia, diabetes mellitus, male gender, obesity and smoking/tobacco exposure. Past treatments include angiotensin  blockers, diuretics, beta blockers and central alpha agonists. The current treatment provides mild improvement. There are no compliance problems.  Hypertensive end-organ damage includes kidney disease and CAD/MI. Identifiable causes of hypertension include chronic renal disease and a thyroid problem.  Thyroid Problem Presents for initial visit. Symptoms include fatigue and weight gain. Patient reports no anxiety, cold intolerance, constipation, depressed mood, hair loss, leg swelling, palpitations or weight loss. The symptoms have been stable. Past treatments include nothing. His past medical history is significant for diabetes, hyperlipidemia and obesity. There are no known risk factors.    Review of systems  Constitutional: + stable body weight,  current Body mass index is 40.25 kg/m. , no fatigue, no subjective hyperthermia, no subjective hypothermia Eyes: no blurry vision, no xerophthalmia ENT: no sore throat, no nodules palpated in throat, no dysphagia/odynophagia, no hoarseness Cardiovascular: no chest pain, no shortness of breath, no palpitations, no leg swelling Respiratory: no cough, no shortness of breath Gastrointestinal: no nausea/vomiting/diarrhea Musculoskeletal: no muscle/joint aches Skin: no rashes, no hyperemia Neurological: no tremors, no numbness, no tingling, no dizziness Psychiatric: no depression, no anxiety  Objective:    BP (!) 141/79 (BP Location: Left Arm, Patient Position: Sitting, Cuff Size: Large)   Pulse 61   Ht '5\' 6"'$  (1.676 m)   Wt 249 lb 6.4 oz (113.1 kg)   BMI 40.25 kg/m   Wt Readings from Last 3 Encounters:  09/09/22 249 lb 6.4 oz (113.1 kg)  09/07/22 244 lb (110.7 kg)  08/20/22 243 lb (110.2 kg)    BP Readings from Last 3 Encounters:  09/09/22 (!) 141/79  09/07/22 (!) 147/79  08/20/22 (!) 168/83    Physical Exam- Limited  Constitutional:  Body mass index is 40.25 kg/m. , not in acute distress, normal state of mind Eyes:  EOMI, no  exophthalmos Musculoskeletal: no gross deformities, strength intact in all four extremities, no gross restriction of joint movements Skin:  no rashes, no hyperemia Neurological: no tremor with outstretched hands  Diabetic Foot Exam - Simple   Simple Foot Form Diabetic Foot exam was performed with the following findings: Yes 09/09/2022 11:49 AM  Visual Inspection No deformities, no ulcerations, no other skin breakdown bilaterally: Yes Sensation Testing Intact to touch and monofilament testing bilaterally: Yes Pulse Check Posterior Tibialis and Dorsalis pulse intact bilaterally: Yes Comments     CMP ( most recent) CMP     Component Value  Date/Time   NA 139 07/23/2022 0921   K 3.9 07/23/2022 0921   CL 104 07/23/2022 0921   CO2 20 07/23/2022 0921   GLUCOSE 100 (H) 07/23/2022 0921   GLUCOSE 234 (H) 08/21/2020 1437   BUN 12 07/23/2022 0921   CREATININE 1.28 (H) 07/23/2022 0921   CREATININE 1.00 03/18/2020 1321   CALCIUM 9.2 07/23/2022 0921   PROT 6.5 07/23/2022 0921   ALBUMIN 4.2 07/23/2022 0921   AST 20 07/23/2022 0921   ALT 16 07/23/2022 0921   ALKPHOS 83 07/23/2022 0921   BILITOT 0.3 07/23/2022 0921   GFRNONAA >60 08/21/2020 1437   GFRNONAA 74 03/18/2020 1321   GFRAA 67 07/17/2020 0953   GFRAA 86 03/18/2020 1321    Diabetic Labs (most recent): Lab Results  Component Value Date   HGBA1C 7.8 (H) 07/23/2022   HGBA1C 7.8 (A) 05/11/2022   HGBA1C 8.6 01/07/2022   MICROALBUR 80 mg/l 05/11/2022   MICROALBUR 150 04/15/2021   MICROALBUR 80 03/20/2020     Assessment & Plan:   1) Uncontrolled type 2 diabetes mellitus with hyperglycemia ([place])  - Gerald Evans has currently uncontrolled symptomatic type 2 DM since  77 years of age.  He presents today with his meter, no logs, showing improved glycemic profile.  His previsit A1c, checked at healthy weight and wellness on 1/25 was 7.8%, unchanged from previous visit.  He did decrease his insulin to 35 units due to drops in  glucose.  He notes when he first started Ozempic he had diarrhea and gas but has improved.  Analysis of his meter shows 7-day average of 144, 14-day average of 146, 30-day average of 146.Marland Kitchen   Recent labs reviewed.   - I had a long discussion with him about the progressive nature of diabetes and the pathology behind its complications.  -his diabetes is complicated by obesity/sedentary life and he remains at a high risk for more acute and chronic complications which include CAD, CVA, CKD, retinopathy, and neuropathy. These are all discussed in detail with him.  - Nutritional counseling repeated at each appointment due to patients tendency to fall back in to old habits.  - The patient admits there is a room for improvement in their diet and drink choices. -  Suggestion is made for the patient to avoid simple carbohydrates from their diet including Cakes, Sweet Desserts / Pastries, Ice Cream, Soda (diet and regular), Sweet Tea, Candies, Chips, Cookies, Sweet Pastries, Store Bought Juices, Alcohol in Excess of 1-2 drinks a day, Artificial Sweeteners, Coffee Creamer, and "Sugar-free" Products. This will help patient to have stable blood glucose profile and potentially avoid unintended weight gain.   - I encouraged the patient to switch to unprocessed or minimally processed complex starch and increased protein intake (animal or plant source), fruits, and vegetables.   - Patient is advised to stick to a routine mealtimes to eat 3 meals a day and avoid unnecessary snacks (to snack only to correct hypoglycemia).  - I have approached him with the following individualized plan to manage  his diabetes and patient agrees:   - he will continue to require insulin treatment in order for him to achieve and maintain control of diabetes to target.    -He is benefiting from simplified insulin regimen with premixed insulin.    -He is advised to lower his 70/30 to 25 units SQ BID with meals if glucose is above 90 and  he is eating.  He can continue Metformin 1000 mg po daily  with breakfast (kidneys are stable).  I gave him sample of Ozempic 0.5 mg SQ weekly to hold him over until he can pick up his new script for the 1 mg dose weekly at his pharmacy..  -He is encouraged to do better monitoring his blood glucose.  He is urged to monitor glucose at least twice per day, before injecting insulin (at breakfast and supper) and call the clinic if readings are less than 70 or greater than 300 for 3 tests in a row.  He could benefit from CGM device but is not interested at this time.  - Specific targets for  A1c;  LDL, HDL,  and Triglycerides were discussed with the patient.  2) Blood Pressure /Hypertension:  His blood pressure is controlled to target for his age.  He is advised to continue Clonidine 0.2 mg po twice daily, HCTZ 25 mg po daily, Losartan 100 mg po daily, and Metoprolol 100 mg po twice daily.    3) Lipids/Hyperlipidemia:  His most recent lipid panel from 03/04/21 shows controlled LDL of 77.  He is advised to continue Simvastatin 20 mg po daily at bedtime.  Side effects and precautions discussed with him.    4)  Weight/Diet:  His Body mass index is 40.25 kg/m.  -   clearly complicating his diabetes care.   he is  a candidate for weight loss. I discussed with him the fact that loss of 5 - 10% of his  current body weight will have the most impact on his diabetes management.  Exercise, and detailed carbohydrates information provided  -  detailed on discharge instructions.  5) Hypothyroidism-acquired He does not have personal or family history of thyroid dysfunction.  His antibody testing was negative ruling out autoimmune thyroid dysfunction.    His most recent TFTs show TSH slightly elevated (Free T4 not checked).  He is advised to continue his Levothyroxine 75 mcg po daily before breakfast (recently increased by healthy weight and wellness).  Will check TFTs (TSH and Free T4) prior to next visit and adjust  dose accordingly.   - The correct intake of thyroid hormone (Levothyroxine, Synthroid), is on empty stomach first thing in the morning, with water, separated by at least 30 minutes from breakfast and other medications,  and separated by more than 4 hours from calcium, iron, multivitamins, acid reflux medications (PPIs).  - This medication is a life-long medication and will be needed to correct thyroid hormone imbalances for the rest of your life.  The dose may change from time to time, based on thyroid blood work.  - It is extremely important to be consistent taking this medication, near the same time each morning.  -AVOID TAKING PRODUCTS CONTAINING BIOTIN (commonly found in Hair, Skin, Nails vitamins) AS IT INTERFERES WITH THE VALIDITY OF THYROID FUNCTION BLOOD TESTS.  6) Chronic Care/Health Maintenance: -he is on ACEI/ARB and Statin medications and is encouraged to initiate and continue to follow up with Ophthalmology, Dentist,  Podiatrist at least yearly or according to recommendations, and advised to  stay away from smoking. I have recommended yearly flu vaccine and pneumonia vaccine at least every 5 years; moderate intensity exercise for up to 150 minutes weekly; and  sleep for at least 7 hours a day.  - he is advised to maintain close follow up with Carrolyn Meiers, MD for primary care needs, as well as his other providers for optimal and coordinated care.     I spent  26  minutes in the  care of the patient today including review of labs from [facility], Lipids, Thyroid Function, Hematology (current and previous including abstractions from other facilities); face-to-face time discussing  his blood glucose readings/logs, discussing hypoglycemia and hyperglycemia episodes and symptoms, medications doses, his options of short and long term treatment based on the latest standards of care / guidelines;  discussion about incorporating lifestyle medicine;  and documenting the encounter. Risk  reduction counseling performed per USPSTF guidelines to reduce obesity and cardiovascular risk factors.     Please refer to Patient Instructions for Blood Glucose Monitoring and Insulin/Medications Dosing Guide"  in media tab for additional information. Please  also refer to " Patient Self Inventory" in the Media  tab for reviewed elements of pertinent patient history.  Gerald Evans participated in the discussions, expressed understanding, and voiced agreement with the above plans.  All questions were answered to his satisfaction. he is encouraged to contact clinic should he have any questions or concerns prior to his return visit.  Follow up plan: - Return in about 4 months (around 01/09/2023) for Diabetes F/U with A1c in office, Thyroid follow up, Previsit labs, Bring meter and logs.  Rayetta Pigg, [facility] [address] [place], [place] [address] Phone: [phone] Fax: [phone]  09/09/2022, 11:56 AM

## 2022-09-09 NOTE — Patient Instructions (Signed)

## 2022-09-29 ENCOUNTER — Ambulatory Visit (INDEPENDENT_AMBULATORY_CARE_PROVIDER_SITE_OTHER): Payer: Medicare HMO | Admitting: Internal Medicine

## 2022-09-29 ENCOUNTER — Encounter: Payer: Self-pay | Admitting: Internal Medicine

## 2022-09-29 VITALS — BP 144/67 | HR 74 | Ht 68.5 in | Wt 249.0 lb

## 2022-09-29 DIAGNOSIS — K7581 Nonalcoholic steatohepatitis (NASH): Secondary | ICD-10-CM

## 2022-09-29 DIAGNOSIS — E039 Hypothyroidism, unspecified: Secondary | ICD-10-CM | POA: Diagnosis not present

## 2022-09-29 DIAGNOSIS — E1165 Type 2 diabetes mellitus with hyperglycemia: Secondary | ICD-10-CM

## 2022-09-29 DIAGNOSIS — I10 Essential (primary) hypertension: Secondary | ICD-10-CM | POA: Diagnosis not present

## 2022-09-29 DIAGNOSIS — E782 Mixed hyperlipidemia: Secondary | ICD-10-CM

## 2022-09-29 DIAGNOSIS — E559 Vitamin D deficiency, unspecified: Secondary | ICD-10-CM

## 2022-09-29 DIAGNOSIS — R972 Elevated prostate specific antigen [PSA]: Secondary | ICD-10-CM

## 2022-09-29 DIAGNOSIS — N529 Male erectile dysfunction, unspecified: Secondary | ICD-10-CM

## 2022-09-29 DIAGNOSIS — Z0001 Encounter for general adult medical examination with abnormal findings: Secondary | ICD-10-CM | POA: Insufficient documentation

## 2022-09-29 NOTE — Assessment & Plan Note (Signed)
Last A1c 7.8 in January.  He is currently prescribed Ozempic 1 mg weekly, Novolin 70/30 25 units twice daily, and metformin 1000 mg daily.  Followed by endocrinology. -No medication changes today.  Endocrinology follow-up scheduled for July. -Diabetic eye exam scheduled for June

## 2022-09-29 NOTE — Assessment & Plan Note (Signed)
BP remains mildly elevated today, 153/73 initially and 144/67 on repeat.  He is currently prescribed losartan 100 mg daily, metoprolol tartrate 100 mg twice daily, and hydralazine 25 mg 3 times daily.  When reviewing his medications, Gerald Evans stated he was taking hydralazine twice daily.  Per review of previous documentation, HTN has previously been well-controlled on this regimen. -No medication today.  We will tentatively plan for follow-up in 6 weeks for BP check.  I have asked that he bring his medications to this appointment for review.

## 2022-09-29 NOTE — Progress Notes (Signed)
New Patient Office Visit  Subjective    Patient ID: Gerald Evans, male    DOB: [birth year]  Age: 77 y.o. MRN: [id number]  CC:  Chief Complaint  Patient presents with   Establish Care    HPI Gerald Evans presents to establish care.  He is a 77 year old male with a past medical history significant for T2DM, HTN, hypothyroidism, HLD, NASH with F3/F4 fibrosis, \and vitamin D insufficiency.  He has most recently been followed by Dr. Legrand Rams.  [name] is currently followed by urology (Dr. Jeffie Pollock), [facility], and endocrinology.  [name] reports feeling fairly well today.  His acute concern is fatigue and dyspnea on exertion.  This is a chronic issue.  He has previously been evaluated by cardiology and pulmonology with no specific etiology identified.  He is a former smoker but quit 30 years ago.  He denies alcohol and illicit drug use.  His family medical history is significant for diabetes, hypertension, and CAD.  Acute concerns, chronic medical conditions, and outstanding preventative care items discussed today are individually addressed A/P below.   Outpatient Encounter Medications as of 09/29/2022  Medication Sig   blood glucose meter kit and supplies Dispense based on patient and insurance preference. Use up to four times daily as directed. (FOR ICD-10 E11.65)   glucose blood (ONETOUCH ULTRA) test strip Use 1 strip to check glucose 3 times daily as directed.   Insulin Pen Needle (BD PEN NEEDLE NANO 2ND GEN) 32G X 4 MM MISC 1 Package by Does not apply route 2 (two) times daily.   levothyroxine (SYNTHROID) 75 MCG tablet Take 1 tablet (75 mcg total) by mouth daily before breakfast.   losartan (COZAAR) 100 MG tablet Take 100 mg by mouth daily.   metFORMIN (GLUCOPHAGE) 1000 MG tablet Take 1 tablet (1,000 mg total) by mouth daily with breakfast.   metoprolol (LOPRESSOR) 100 MG tablet Take 100 mg by mouth 2 (two) times daily.    NOVOLIN 70/30 KWIKPEN (70-30) 100  UNIT/ML KwikPen Inject 25 Units into the skin in the morning and at bedtime.   OneTouch Delica Lancets [id number] MISC 1 each by Does not apply route 3 (three) times daily. Use to check blood glucose three times daily   Semaglutide, 1 MG/DOSE, 4 MG/3ML SOPN Inject 1 mg as directed once a week.   simvastatin (ZOCOR) 20 MG tablet Take 20 mg by mouth daily.   tadalafil (CIALIS) 20 MG tablet Use either 1 tab po daily prn or 1/2 tab po with 1/2 tab sildenafil 100mg  po daily prn   Vitamin D, Ergocalciferol, (DRISDOL) 1.25 MG (50000 UNIT) CAPS capsule Take 1 capsule (50,000 Units total) by mouth every 7 (seven) days.   [DISCONTINUED] sildenafil (VIAGRA) 100 MG tablet Take 100 mg by mouth daily as needed.   No facility-administered encounter medications on file as of 09/29/2022.    Past Medical History:  Diagnosis Date   Colon polyps    Diabetes mellitus without complication    Edema of both lower extremities    Fatty liver    Hypercholesteremia    Hypertension     Past Surgical History:  Procedure Laterality Date   COLONOSCOPY     COLONOSCOPY  04/21/2012   Procedure: COLONOSCOPY;  Surgeon: Rogene Houston, MD;  [facility];  Service: Endoscopy;  Laterality: N/A;  1200   COLONOSCOPY N/A 09/09/2017   Procedure: COLONOSCOPY;  Surgeon: Rogene Houston, MD;  Location: [facility];  Service: Endoscopy;  Laterality: N/A;  1200-rescheduled to 3/14/ @12 :00pm per [place]     POLYPECTOMY  09/09/2017   Procedure: POLYPECTOMY;  Surgeon: Rogene Houston, MD;  Location: [facility];  Service: Endoscopy;;  Transverse Colon (CB)    Family History  Problem Relation Age of Onset   Hypertension Mother    Hypertension Father    Alcoholism Father    Obesity Father    Diabetes Sister    Hypertension Sister    Healthy Daughter    Healthy Son    Colon cancer Neg Hx     Social History   Socioeconomic History   Marital status: Married    Spouse name: Not on file   Number of  children: 2   Years of education: Not on file   Highest education level: Not on file  Occupational History   Occupation: retired  Tobacco Use   Smoking status: Former    Packs/day: 0.50    Years: 20.00    Additional pack years: 0.00    Total pack years: 10.00    Types: Cigarettes   Smokeless tobacco: Never  Vaping Use   Vaping Use: Never used  Substance and Sexual Activity   Alcohol use: Yes    Alcohol/week: 4.0 standard drinks of alcohol    Types: 4 Standard drinks or equivalent per week   Drug use: No   Sexual activity: Not on file  Other Topics Concern   Not on file  Social History Narrative   Not on file   Social Determinants of Health   Financial Resource Strain: Not on file  Food Insecurity: Not on file  Transportation Needs: Not on file  Physical Activity: Not on file  Stress: Not on file  Social Connections: Not on file  Intimate Partner Violence: Not on file    Review of Systems  Constitutional:  Positive for malaise/fatigue. Negative for chills and fever.  HENT:  Negative for sore throat.   Respiratory:  Positive for shortness of breath (DOE). Negative for cough.   Cardiovascular:  Negative for chest pain, palpitations and leg swelling.  Gastrointestinal:  Negative for abdominal pain, blood in stool, constipation, diarrhea, nausea and vomiting.  Genitourinary:  Negative for dysuria and hematuria.  Musculoskeletal:  Negative for myalgias.  Skin:  Negative for itching and rash.  Neurological:  Negative for dizziness and headaches.  Psychiatric/Behavioral:  Negative for depression and suicidal ideas.         Objective    BP (!) 144/67   Pulse 74   Ht 5' 8.5" (1.74 m)   Wt 249 lb (112.9 kg)   SpO2 93%   BMI 37.31 kg/m   Physical Exam Vitals reviewed.  Constitutional:      General: He is not in acute distress.    Appearance: Normal appearance. He is obese. He is not ill-appearing.  HENT:     Head: Normocephalic and atraumatic.     Right Ear:  External ear normal.     Left Ear: External ear normal.     Nose: Nose normal. No congestion or rhinorrhea.     Mouth/Throat:     Mouth: Mucous membranes are moist.     Pharynx: Oropharynx is clear.  Eyes:     General: No scleral icterus.    Extraocular Movements: Extraocular movements intact.     Conjunctiva/sclera: Conjunctivae normal.     Pupils: Pupils are equal, round, and reactive to light.  Cardiovascular:  Rate and Rhythm: Normal rate and regular rhythm.     Pulses: Normal pulses.     Heart sounds: Normal heart sounds. No murmur heard. Pulmonary:     Effort: Pulmonary effort is normal.     Breath sounds: Normal breath sounds. No wheezing, rhonchi or rales.  Abdominal:     General: Abdomen is flat. Bowel sounds are normal. There is no distension.     Palpations: Abdomen is soft.     Tenderness: There is no abdominal tenderness.     Hernia: A hernia (Ventral hernia) is present.  Musculoskeletal:        General: No swelling or deformity. Normal range of motion.     Cervical back: Normal range of motion.  Skin:    General: Skin is warm and dry.     Capillary Refill: Capillary refill takes less than 2 seconds.  Neurological:     General: No focal deficit present.     Mental Status: He is alert and oriented to person, place, and time.     Motor: No weakness.  Psychiatric:        Mood and Affect: Mood normal.        Behavior: Behavior normal.        Thought Content: Thought content normal.   Last CBC Lab Results  Component Value Date   WBC 7.5 07/23/2022   HGB 12.5 (L) 07/23/2022   HCT 39.0 07/23/2022   MCV 74 (L) 07/23/2022   MCH 23.8 (L) 07/23/2022   RDW 18.3 (H) 07/23/2022   PLT 218 [id number]   Last metabolic panel Lab Results  Component Value Date   GLUCOSE 100 (H) 07/23/2022   NA 139 07/23/2022   K 3.9 07/23/2022   CL 104 07/23/2022   CO2 20 07/23/2022   BUN 12 07/23/2022   CREATININE 1.28 (H) 07/23/2022   EGFR 58 (L) 07/23/2022   CALCIUM 9.2  07/23/2022   PROT 6.5 07/23/2022   ALBUMIN 4.2 07/23/2022   LABGLOB 2.3 07/23/2022   AGRATIO 1.8 07/23/2022   BILITOT 0.3 07/23/2022   ALKPHOS 83 07/23/2022   AST 20 07/23/2022   ALT 16 07/23/2022   ANIONGAP 11 08/21/2020   Last lipids Lab Results  Component Value Date   CHOL 128 07/23/2022   HDL 41 07/23/2022   LDLCALC 69 07/23/2022   TRIG 96 07/23/2022   CHOLHDL 3.4 05/07/2022   Last hemoglobin A1c Lab Results  Component Value Date   HGBA1C 7.8 (H) 07/23/2022   Last thyroid functions Lab Results  Component Value Date   TSH 5.100 (H) 07/23/2022   T3TOTAL 98 03/04/2021   Last vitamin D Lab Results  Component Value Date   VD25OH 48.0 07/23/2022   Last vitamin B12 and Folate Lab Results  Component Value Date   VITAMINB12 422 07/23/2022   FOLATE 16.9 05/25/2016    Assessment & Plan:   Problem List Items Addressed This Visit       Essential hypertension    BP remains mildly elevated today, 153/73 initially and 144/67 on repeat.  He is currently prescribed losartan 100 mg daily, metoprolol tartrate 100 mg twice daily, and hydralazine 25 mg 3 times daily.  When reviewing his medications, Mr. Ury stated he was taking hydralazine twice daily.  Per review of previous documentation, HTN has previously been well-controlled on this regimen. -No medication today.  We will tentatively plan for follow-up in 6 weeks for BP check.  I have asked that he bring his medications to this  appointment for review.      NASH (nonalcoholic steatohepatitis)    Followed by gastroenterology (Dr. Jenetta Downer).  Recent labs/imaging are stable.  He is scheduled for follow-up with GI in June.      Uncontrolled type 2 diabetes mellitus with hyperglycemia - Primary    Last A1c 7.8 in January.  He is currently prescribed Ozempic 1 mg weekly, Novolin 70/30 25 units twice daily, and metformin 1000 mg daily.  Followed by endocrinology. -No medication changes today.  Endocrinology follow-up  scheduled for July. -Diabetic eye exam scheduled for June      Hypothyroidism    Currently prescribed levothyroxine 75 mcg daily.  Followed by endocrinology.  TSH 5.1 in January. -No medication changes today.  Endocrinology follow-up scheduled for July.      Mixed hyperlipidemia    Currently prescribed simvastatin 20 mg daily.  Lipid panel last updated in January.  Total cholesterol 128 and LDL 69. -No medication changes today      Vitamin D insufficiency    Noted on review his labs.  He is currently on high-dose, weekly vitamin D supplementation. -No medication changes today      Erectile dysfunction    Followed by urology (Dr. Owens Shark).  Currently prescribed tadalafil. -No medication changes today.      Elevated PSA    Latest PSA 5.  Followed by urology.        Encounter for general adult medical examination with abnormal findings    Presenting today to establish care.  Recent records and labs been reviewed. -We will request records from Dr. Josephine Cables office -Vaccine records requested from [facility] in [place] -We will tentatively plan for follow-up in 6 weeks for BP check       Return in about 6 weeks (around 11/10/2022) for HTN.   Johnette Abraham, MD

## 2022-09-29 NOTE — Patient Instructions (Signed)
It was a pleasure to see you today.  Thank you for giving [place] the opportunity to be involved in your care.  Below is a brief recap of your visit and next steps.  We will plan to see you again in 6 weeks.  Summary You have established care today We will request records from Dr. Josephine Cables office and plan for follow up in 6 weeks to check your blood pressure.

## 2022-09-29 NOTE — Assessment & Plan Note (Signed)
Currently prescribed simvastatin 20 mg daily.  Lipid panel last updated in January.  Total cholesterol 128 and LDL 69. -No medication changes today

## 2022-09-29 NOTE — Assessment & Plan Note (Signed)
Followed by gastroenterology (Dr. Jenetta Downer).  Recent labs/imaging are stable.  He is scheduled for follow-up with GI in June.

## 2022-09-29 NOTE — Assessment & Plan Note (Signed)
Noted on review his labs.  He is currently on high-dose, weekly vitamin D supplementation. -No medication changes today

## 2022-09-29 NOTE — Assessment & Plan Note (Signed)
Currently prescribed levothyroxine 75 mcg daily.  Followed by endocrinology.  TSH 5.1 in January. -No medication changes today.  Endocrinology follow-up scheduled for July.

## 2022-09-29 NOTE — Assessment & Plan Note (Signed)
Presenting today to establish care.  Recent records and labs been reviewed. -We will request records from Dr. Josephine Cables office -Vaccine records requested from [facility] in [place] -We will tentatively plan for follow-up in 6 weeks for BP check

## 2022-09-29 NOTE — Assessment & Plan Note (Signed)
Followed by urology (Dr. Owens Shark).  Currently prescribed tadalafil. -No medication changes today.

## 2022-09-29 NOTE — Assessment & Plan Note (Signed)
Latest PSA 5.  Followed by urology.

## 2022-10-06 ENCOUNTER — Ambulatory Visit (INDEPENDENT_AMBULATORY_CARE_PROVIDER_SITE_OTHER): Payer: Medicare HMO | Admitting: Family Medicine

## 2022-10-06 ENCOUNTER — Encounter (INDEPENDENT_AMBULATORY_CARE_PROVIDER_SITE_OTHER): Payer: Self-pay | Admitting: Family Medicine

## 2022-10-06 VITALS — BP 142/79 | HR 67 | Temp 98.1°F | Ht 68.0 in | Wt 245.0 lb

## 2022-10-06 DIAGNOSIS — Z7985 Long-term (current) use of injectable non-insulin antidiabetic drugs: Secondary | ICD-10-CM

## 2022-10-06 DIAGNOSIS — E1159 Type 2 diabetes mellitus with other circulatory complications: Secondary | ICD-10-CM

## 2022-10-06 DIAGNOSIS — E669 Obesity, unspecified: Secondary | ICD-10-CM

## 2022-10-06 DIAGNOSIS — E559 Vitamin D deficiency, unspecified: Secondary | ICD-10-CM | POA: Diagnosis not present

## 2022-10-06 DIAGNOSIS — Z6837 Body mass index (BMI) 37.0-37.9, adult: Secondary | ICD-10-CM

## 2022-10-06 DIAGNOSIS — I152 Hypertension secondary to endocrine disorders: Secondary | ICD-10-CM

## 2022-10-06 DIAGNOSIS — Z7984 Long term (current) use of oral hypoglycemic drugs: Secondary | ICD-10-CM

## 2022-10-06 MED ORDER — VITAMIN D (ERGOCALCIFEROL) 1.25 MG (50000 UNIT) PO CAPS: 50000.0000 [IU] | ORAL_CAPSULE | ORAL | 0 refills | Status: DC

## 2022-10-06 NOTE — Progress Notes (Unsigned)
Chief Complaint:   OBESITY Gerald Evans is here to discuss his progress with his obesity treatment plan along with follow-up of his obesity related diagnoses. Gerald Evans is on the Category 3 Plan and states he is following his eating plan approximately 60% of the time. Gerald Evans states he is walk 45 minutes 3 times per week.  Today's visit was #: 22 Starting weight: 242 lbs Starting date: 03/04/2021 Today's weight: 245 lbs Today's date: 10/06/2022 Total lbs lost to date: 0 Total lbs lost since last in-office visit: +1 lb  Interim History: Since last appointment here patient has met a new PCP and has had his ozempic increased to 1mg .    Not noticing much satiety on higher dose of ozempic.  He had 1-2 days of GI upset initially and hasn't had anything since that. Blood sugar has been elevated.  Has tried to stay mindful of food choices when at home.  He isn't weighing or measuring food.  He is trying to stick to the bones of the meal plan and getting protein and vegetables in while being aware of decreasing his amount of carbohydrate intake.  He is going to [place]. Maisie Fus next week for 10 days.   Subjective:   1. Vitamin D deficiency Patient is on weekly Vitamin D.  Patient denies nausea, vomiting, or muscle weakness but is positive for fatigue.   2. Hypertension associated with type 2 diabetes mellitus Blood pressure is well controlled today but has been elevated at prior appointments.  Patient denies chest pain, chest pressure or headache.  She is taking Cozaar, lopressor.  She was previously on hydralazine BID.    Assessment/Plan:   1. Vitamin D deficiency Refill - Vitamin D, Ergocalciferol, (DRISDOL) 1.25 MG (50000 UNIT) CAPS capsule; Take 1 capsule (50,000 Units total) by mouth every 7 (seven) days.  Dispense: 4 capsule; Refill: 0  2. Hypertension associated with type 2 diabetes mellitus Continue current medications, no change in dose. Follow up with blood pressure at subsequent  appointments.   3. BMI 37.0-37.9, adult  4. Obesity with starting BMI of 36.8 Gerald Evans is currently in the action stage of change. As such, his goal is to continue with weight loss efforts. He has agreed to the Category 3 Plan.   Exercise goals: All adults should avoid inactivity. Some physical activity is better than none, and adults who participate in any amount of physical activity gain some health benefits.  Behavioral modification strategies: increasing lean protein intake, meal planning and cooking strategies, keeping healthy foods in the home, and planning for success.  Gerald Evans has agreed to follow-up with our clinic in 4 weeks. He was informed of the importance of frequent follow-up visits to maximize his success with intensive lifestyle modifications for his multiple health conditions.   Objective:   Blood pressure (!) 142/79, pulse 67, temperature 98.1 F (36.7 C), height 5\' 8"  (1.727 m), weight 245 lb (111.1 kg), SpO2 96 %. Body mass index is 37.25 kg/m.  General: Cooperative, alert, well developed, in no acute distress. HEENT: Conjunctivae and lids unremarkable. Cardiovascular: Regular rhythm.  Lungs: Normal work of breathing. Neurologic: No focal deficits.   Lab Results  Component Value Date   CREATININE 1.28 (H) 07/23/2022   BUN 12 07/23/2022   NA 139 07/23/2022   K 3.9 07/23/2022   CL 104 07/23/2022   CO2 20 07/23/2022   Lab Results  Component Value Date   ALT 16 07/23/2022   AST 20 07/23/2022   ALKPHOS 83 07/23/2022  BILITOT 0.3 07/23/2022   Lab Results  Component Value Date   HGBA1C 7.8 (H) 07/23/2022   HGBA1C 7.8 (A) 05/11/2022   HGBA1C 8.6 01/07/2022   HGBA1C 9.7 (A) 10/08/2021   HGBA1C 10.1 07/16/2021   Lab Results  Component Value Date   INSULIN 25.6 (H) 07/23/2022   Lab Results  Component Value Date   TSH 5.100 (H) 07/23/2022   Lab Results  Component Value Date   CHOL 128 07/23/2022   HDL 41 07/23/2022   LDLCALC 69 07/23/2022   TRIG  96 07/23/2022   CHOLHDL 3.4 05/07/2022   Lab Results  Component Value Date   VD25OH 48.0 07/23/2022   VD25OH 45.3 12/01/2021   VD25OH 34.1 03/04/2021   Lab Results  Component Value Date   WBC 7.5 07/23/2022   HGB 12.5 (L) 07/23/2022   HCT 39.0 07/23/2022   MCV 74 (L) 07/23/2022   PLT 218 07/23/2022   Lab Results  Component Value Date   IRON 54 07/23/2022   TIBC 285 07/23/2022   FERRITIN 124 07/23/2022   Attestation Statements:   Reviewed by clinician on day of visit: allergies, medications, problem list, medical history, surgical history, family history, social history, and previous encounter notes.  I, Malcolm Metro, RMA, am acting as transcriptionist for Reuben Likes, MD.  I have reviewed the above documentation for accuracy and completeness, and I agree with the above. - Reuben Likes, MD

## 2022-10-08 NOTE — Addendum Note (Signed)
Addended by: Langston Reusing on: 10/08/2022 10:07 PM   Modules accepted: Level of Service

## 2022-10-28 ENCOUNTER — Encounter (INDEPENDENT_AMBULATORY_CARE_PROVIDER_SITE_OTHER): Payer: Self-pay | Admitting: Family Medicine

## 2022-10-28 ENCOUNTER — Ambulatory Visit (INDEPENDENT_AMBULATORY_CARE_PROVIDER_SITE_OTHER): Payer: Medicare HMO | Admitting: Family Medicine

## 2022-10-28 VITALS — BP 149/73 | HR 69 | Temp 97.7°F | Ht 68.0 in | Wt 243.0 lb

## 2022-10-28 DIAGNOSIS — E559 Vitamin D deficiency, unspecified: Secondary | ICD-10-CM | POA: Diagnosis not present

## 2022-10-28 DIAGNOSIS — E669 Obesity, unspecified: Secondary | ICD-10-CM

## 2022-10-28 DIAGNOSIS — Z794 Long term (current) use of insulin: Secondary | ICD-10-CM

## 2022-10-28 DIAGNOSIS — Z6836 Body mass index (BMI) 36.0-36.9, adult: Secondary | ICD-10-CM | POA: Diagnosis not present

## 2022-10-28 DIAGNOSIS — E1165 Type 2 diabetes mellitus with hyperglycemia: Secondary | ICD-10-CM | POA: Diagnosis not present

## 2022-10-28 DIAGNOSIS — Z6837 Body mass index (BMI) 37.0-37.9, adult: Secondary | ICD-10-CM

## 2022-10-28 DIAGNOSIS — Z7985 Long-term (current) use of injectable non-insulin antidiabetic drugs: Secondary | ICD-10-CM

## 2022-10-28 MED ORDER — VITAMIN D (ERGOCALCIFEROL) 1.25 MG (50000 UNIT) PO CAPS: 50000.0000 [IU] | ORAL_CAPSULE | ORAL | 0 refills | Status: DC

## 2022-10-28 NOTE — Progress Notes (Signed)
Chief Complaint:   OBESITY Gerald Evans is here to discuss his progress with his obesity treatment plan along with follow-up of his obesity related diagnoses. Gerald Evans is on the Category 3 Plan and states he is following his eating plan approximately 60% of the time. Gerald Evans states he is not exercising.   Today's visit was #: 23 Starting weight: 242 lbs Starting date: 10/06/2022 Today's weight: 243 lbs Today's date: 10/28/2022 Total lbs lost to date: 0 Total lbs lost since last in-office visit: 2 lbs  Interim History: Patient has walked less in the last few weeks due to knee pain.  He has been more consistent with his food intake. He is getting food from plan just not quantity that is specified.  Most consistently missing food in the dinner meal. He does take in protein shakes to help with his protein intake. Thinks the next two weeks he may have to travel for graduation (law school graduation for his granddaughter).    Subjective:   1. Type 2 diabetes mellitus with hyperglycemia, with long-term current use of insulin (HCC) Elevated blood sugar after steroid injection for trigger finger.  Patient is on 25 units twice daily now.  FBS now in the 130s-140s.  No hypoglycemic events.  2. Vitamin D deficiency Patient is on prescription vitamin D.  Patient has labs with PCP scheduled for 2 weeks.  Assessment/Plan:   1. Type 2 diabetes mellitus with hyperglycemia, with long-term current use of insulin (HCC) Continue current plan for slow insulin titration.  Continue Ozempic.  No change in dose of Ozempic as patient is still working to get up to goal of total calories and protein for the day.  2. Vitamin D deficiency Refill - Vitamin D, Ergocalciferol, (DRISDOL) 1.25 MG (50000 UNIT) CAPS capsule; Take 1 capsule (50,000 Units total) by mouth every 7 (seven) days.  Dispense: 4 capsule; Refill: 0  3. BMI 37.0-37.9, adult  4. Obesity with starting BMI of 36.8 Gerald Evans is currently in the action  stage of change. As such, his goal is to continue with weight loss efforts. He has agreed to the Category 3 Plan.   Exercise goals: All adults should avoid inactivity. Some physical activity is better than none, and adults who participate in any amount of physical activity gain some health benefits.  Patient to start 10 minutes 3 times a week and activity.  Behavioral modification strategies: increasing lean protein intake, meal planning and cooking strategies, keeping healthy foods in the home, and planning for success.  Gerald Evans has agreed to follow-up with our clinic in 3-4 weeks. He was informed of the importance of frequent follow-up visits to maximize his success with intensive lifestyle modifications for his multiple health conditions.   Objective:   Blood pressure (!) 149/73, pulse 69, temperature 97.7 F (36.5 C), height 5\' 8"  (1.727 m), weight 243 lb (110.2 kg), SpO2 95 %. Body mass index is 36.95 kg/m.  General: Cooperative, alert, well developed, in no acute distress. HEENT: Conjunctivae and lids unremarkable. Cardiovascular: Regular rhythm.  Lungs: Normal work of breathing. Neurologic: No focal deficits.   Lab Results  Component Value Date   CREATININE 1.28 (H) 07/23/2022   BUN 12 07/23/2022   NA 139 07/23/2022   K 3.9 07/23/2022   CL 104 07/23/2022   CO2 20 07/23/2022   Lab Results  Component Value Date   ALT 16 07/23/2022   AST 20 07/23/2022   ALKPHOS 83 07/23/2022   BILITOT 0.3 07/23/2022   Lab Results  Component Value Date   HGBA1C 7.8 (H) 07/23/2022   HGBA1C 7.8 (A) 05/11/2022   HGBA1C 8.6 01/07/2022   HGBA1C 9.7 (A) 10/08/2021   HGBA1C 10.1 07/16/2021   Lab Results  Component Value Date   INSULIN 25.6 (H) 07/23/2022   Lab Results  Component Value Date   TSH 5.100 (H) 07/23/2022   Lab Results  Component Value Date   CHOL 128 07/23/2022   HDL 41 07/23/2022   LDLCALC 69 07/23/2022   TRIG 96 07/23/2022   CHOLHDL 3.4 05/07/2022   Lab Results   Component Value Date   VD25OH 48.0 07/23/2022   VD25OH 45.3 12/01/2021   VD25OH 34.1 03/04/2021   Lab Results  Component Value Date   WBC 7.5 07/23/2022   HGB 12.5 (L) 07/23/2022   HCT 39.0 07/23/2022   MCV 74 (L) 07/23/2022   PLT 218 07/23/2022   Lab Results  Component Value Date   IRON 54 07/23/2022   TIBC 285 07/23/2022   FERRITIN 124 07/23/2022   Attestation Statements:   Reviewed by clinician on day of visit: allergies, medications, problem list, medical history, surgical history, family history, social history, and previous encounter notes.  I, Malcolm Metro, RMA, am acting as transcriptionist for Reuben Likes, MD.  I have reviewed the above documentation for accuracy and completeness, and I agree with the above. - Reuben Likes, MD

## 2022-11-05 ENCOUNTER — Encounter (INDEPENDENT_AMBULATORY_CARE_PROVIDER_SITE_OTHER): Payer: Self-pay | Admitting: *Deleted

## 2022-11-06 ENCOUNTER — Telehealth: Payer: Self-pay | Admitting: Internal Medicine

## 2022-11-06 NOTE — Telephone Encounter (Signed)
Contacted Gerald Evans to schedule their annual wellness visit. Appointment made for 11/18/2022.  Thank you,  Judeth Cornfield,  AMB Clinical Support [facility] AWV Program Direct Dial ??[phone]

## 2022-11-08 ENCOUNTER — Other Ambulatory Visit (INDEPENDENT_AMBULATORY_CARE_PROVIDER_SITE_OTHER): Payer: Self-pay | Admitting: Family Medicine

## 2022-11-08 DIAGNOSIS — E038 Other specified hypothyroidism: Secondary | ICD-10-CM

## 2022-11-17 ENCOUNTER — Telehealth (INDEPENDENT_AMBULATORY_CARE_PROVIDER_SITE_OTHER): Payer: Self-pay | Admitting: Gastroenterology

## 2022-11-17 ENCOUNTER — Ambulatory Visit: Payer: Medicare HMO | Admitting: Internal Medicine

## 2022-11-17 DIAGNOSIS — K7581 Nonalcoholic steatohepatitis (NASH): Secondary | ICD-10-CM

## 2022-11-17 NOTE — Telephone Encounter (Signed)
Pt left voicemail in regards to scheduling imaging for Dr.Carlan. Pt is needing [place] every 6 months per lab results 11/20223. Contacted pt and made sure he was inquiring about [place]. Pt states he has appt with [facility] on 11/30/22. Informed pt I would call central scheduling and see if they have anything before 11/30/22. Central scheduling states nothing before 11/30/22.  Pt scheduled for 12/04/22 11:30AM; NPO 6 hrs prior.  Pt contacted and made aware   Pt wants to know if he can be scheduled after his [place] for [facility]. Has appt on 11/30/22 with [facility]. Please advise and call pt. Thank you!

## 2022-11-18 ENCOUNTER — Other Ambulatory Visit (INDEPENDENT_AMBULATORY_CARE_PROVIDER_SITE_OTHER): Payer: Self-pay | Admitting: Family Medicine

## 2022-11-18 ENCOUNTER — Ambulatory Visit (INDEPENDENT_AMBULATORY_CARE_PROVIDER_SITE_OTHER): Payer: Medicare HMO

## 2022-11-18 VITALS — BP 149/73 | Ht 68.0 in | Wt 245.0 lb

## 2022-11-18 DIAGNOSIS — E038 Other specified hypothyroidism: Secondary | ICD-10-CM

## 2022-11-18 DIAGNOSIS — Z0000 Encounter for general adult medical examination without abnormal findings: Secondary | ICD-10-CM | POA: Diagnosis not present

## 2022-11-18 NOTE — Patient Instructions (Signed)
Gerald Evans , Thank you for taking time to come for your Medicare Wellness Visit. I appreciate your ongoing commitment to your health goals. Please review the following plan we discussed and let me know if I can assist you in the future.   These are the goals we discussed:  Goals   None     This is a list of the screening recommended for you and due dates:  Health Maintenance  Topic Date Due   Eye exam for diabetics  05/21/2023*   COVID-19 Vaccine (6 - 2023-24 season) 12/04/2023*   Hemoglobin A1C  01/21/2023   Flu Shot  01/28/2023   Yearly kidney health urinalysis for diabetes  05/12/2023   Yearly kidney function blood test for diabetes  07/24/2023   Complete foot exam   09/09/2023   Medicare Annual Wellness Visit  11/18/2023   Colon Cancer Screening  09/09/2024   DTaP/Tdap/Td vaccine (2 - Td or Tdap) 12/21/2027   Pneumonia Vaccine  Completed   Hepatitis C Screening: USPSTF Recommendation to screen - Ages 67-79 yo.  Completed   Zoster (Shingles) Vaccine  Completed   HPV Vaccine  Aged Out  *Topic was postponed. The date shown is not the original due date.    Advanced directives: Advance directive discussed with you today. Even though you declined this today, please call our office should you change your mind, and we can give you the proper paperwork for you to fill out. Advance care planning is a way to make decisions about medical care that fits your values in case you are ever unable to make these decisions for yourself.   Conditions/risks identified: Aim for 30 minutes of exercise or brisk walking, 6-8 glasses of water, and 5 servings of fruits and vegetables each day.   Next appointment: Follow up in one year for your annual wellness visit. 11/24/2023 at [facility] via telephone  Preventive Care 65 Years and Older, Male  Preventive care refers to lifestyle choices and visits with your health care provider that can promote health and wellness. What does preventive care include? A  yearly physical exam. This is also called an annual well check. Dental exams once or twice a year. Routine eye exams. Ask your health care provider how often you should have your eyes checked. Personal lifestyle choices, including: Daily care of your teeth and gums. Regular physical activity. Eating a healthy diet. Avoiding tobacco and drug use. Limiting alcohol use. Practicing safe sex. Taking low doses of aspirin every day. Taking vitamin and mineral supplements as recommended by your health care provider. What happens during an annual well check? The services and screenings done by your health care provider during your annual well check will depend on your age, overall health, lifestyle risk factors, and family history of disease. Counseling  Your health care provider may ask you questions about your: Alcohol use. Tobacco use. Drug use. Emotional well-being. Home and relationship well-being. Sexual activity. Eating habits. History of falls. Memory and ability to understand (cognition). Work and work Astronomer. Screening  You may have the following tests or measurements: Height, weight, and BMI. Blood pressure. Lipid and cholesterol levels. These may be checked every 5 years, or more frequently if you are over 87 years old. Skin check. Lung cancer screening. You may have this screening every year starting at age 67 if you have a 30-pack-year history of smoking and currently smoke or have quit within the past 15 years. Fecal occult blood test (FOBT) of the stool. You may  have this test every year starting at age 77. Flexible sigmoidoscopy or colonoscopy. You may have a sigmoidoscopy every 5 years or a colonoscopy every 10 years starting at age 11. Prostate cancer screening. Recommendations will vary depending on your family history and other risks. Hepatitis C blood test. Hepatitis B blood test. Sexually transmitted disease (STD) testing. Diabetes screening. This is done by  checking your blood sugar (glucose) after you have not eaten for a while (fasting). You may have this done every 1-3 years. Abdominal aortic aneurysm (AAA) screening. You may need this if you are a current or former smoker. Osteoporosis. You may be screened starting at age 41 if you are at high risk. Talk with your health care provider about your test results, treatment options, and if necessary, the need for more tests. Vaccines  Your health care provider may recommend certain vaccines, such as: Influenza vaccine. This is recommended every year. Tetanus, diphtheria, and acellular pertussis (Tdap, Td) vaccine. You may need a Td booster every 10 years. Zoster vaccine. You may need this after age 27. Pneumococcal 13-valent conjugate (PCV13) vaccine. One dose is recommended after age 47. Pneumococcal polysaccharide (PPSV23) vaccine. One dose is recommended after age 37. Talk to your health care provider about which screenings and vaccines you need and how often you need them. This information is not intended to replace advice given to you by your health care provider. Make sure you discuss any questions you have with your health care provider. Document Released: 07/12/2015 Document Revised: 03/04/2016 Document Reviewed: 04/16/2015 Elsevier Interactive Patient Education  2017 [employer].  Fall Prevention in the Home Falls can cause injuries. They can happen to people of all ages. There are many things you can do to make your home safe and to help prevent falls. What can I do on the outside of my home? Regularly fix the edges of walkways and driveways and fix any cracks. Remove anything that might make you trip as you walk through a door, such as a raised step or threshold. Trim any bushes or trees on the path to your home. Use bright outdoor lighting. Clear any walking paths of anything that might make someone trip, such as rocks or tools. Regularly check to see if handrails are loose or  broken. Make sure that both sides of any steps have handrails. Any raised decks and porches should have guardrails on the edges. Have any leaves, snow, or ice cleared regularly. Use sand or salt on walking paths during winter. Clean up any spills in your garage right away. This includes oil or grease spills. What can I do in the bathroom? Use night lights. Install grab bars by the toilet and in the tub and shower. Do not use towel bars as grab bars. Use non-skid mats or decals in the tub or shower. If you need to sit down in the shower, use a plastic, non-slip stool. Keep the floor dry. Clean up any water that spills on the floor as soon as it happens. Remove soap buildup in the tub or shower regularly. Attach bath mats securely with double-sided non-slip rug tape. Do not have throw rugs and other things on the floor that can make you trip. What can I do in the bedroom? Use night lights. Make sure that you have a light by your bed that is easy to reach. Do not use any sheets or blankets that are too big for your bed. They should not hang down onto the floor. Have a firm  chair that has side arms. You can use this for support while you get dressed. Do not have throw rugs and other things on the floor that can make you trip. What can I do in the kitchen? Clean up any spills right away. Avoid walking on wet floors. Keep items that you use a lot in easy-to-reach places. If you need to reach something above you, use a strong step stool that has a grab bar. Keep electrical cords out of the way. Do not use floor polish or wax that makes floors slippery. If you must use wax, use non-skid floor wax. Do not have throw rugs and other things on the floor that can make you trip. What can I do with my stairs? Do not leave any items on the stairs. Make sure that there are handrails on both sides of the stairs and use them. Fix handrails that are broken or loose. Make sure that handrails are as long as  the stairways. Check any carpeting to make sure that it is firmly attached to the stairs. Fix any carpet that is loose or worn. Avoid having throw rugs at the top or bottom of the stairs. If you do have throw rugs, attach them to the floor with carpet tape. Make sure that you have a light switch at the top of the stairs and the bottom of the stairs. If you do not have them, ask someone to add them for you. What else can I do to help prevent falls? Wear shoes that: Do not have high heels. Have rubber bottoms. Are comfortable and fit you well. Are closed at the toe. Do not wear sandals. If you use a stepladder: Make sure that it is fully opened. Do not climb a closed stepladder. Make sure that both sides of the stepladder are locked into place. Ask someone to hold it for you, if possible. Clearly mark and make sure that you can see: Any grab bars or handrails. First and last steps. Where the edge of each step is. Use tools that help you move around (mobility aids) if they are needed. These include: Canes. Walkers. Scooters. Crutches. Turn on the lights when you go into a dark area. Replace any light bulbs as soon as they burn out. Set up your furniture so you have a clear path. Avoid moving your furniture around. If any of your floors are uneven, fix them. If there are any pets around you, be aware of where they are. Review your medicines with your doctor. Some medicines can make you feel dizzy. This can increase your chance of falling. Ask your doctor what other things that you can do to help prevent falls. This information is not intended to replace advice given to you by your health care provider. Make sure you discuss any questions you have with your health care provider. Document Released: 04/11/2009 Document Revised: 11/21/2015 Document Reviewed: 07/20/2014 Elsevier Interactive Patient Education  2017 Elsevier Inc. 11/24/2023 at 2pm

## 2022-11-18 NOTE — Progress Notes (Signed)
I connected with  [name] on 11/18/22 by a audio enabled telemedicine application and verified that I am speaking with the correct person using two identifiers.  Patient Location: Home  Provider Location: Home Office  I discussed the limitations of evaluation and management by telemedicine. The patient expressed understanding and agreed to proceed. Subjective:   Gerald Evans is a 77 y.o. male who presents for Medicare Annual/Subsequent preventive examination.  Review of Systems     Cardiac Risk Factors include: advanced age (>31men, >66 women);diabetes mellitus;dyslipidemia;hypertension;male gender;obesity (BMI >30kg/m2)     Objective:    Today's Vitals   11/18/22 1002  BP: (!) 149/73  Weight: 245 lb (111.1 kg)  Height: 5\' 8"  (1.727 m)   Body mass index is 37.25 kg/m.     11/18/2022   10:11 AM 08/21/2020    1:03 PM 09/09/2017   10:55 AM 08/19/2016    2:58 PM 04/21/2012   11:30 AM  Advanced Directives  Does Patient Have a Medical Advance Directive? No No No No Patient does not have advance directive;Patient would not like information  Would patient like information on creating a medical advance directive? No - Patient declined  No - Patient declined No - Patient declined     Current Medications (verified) Outpatient Encounter Medications as of 11/18/2022  Medication Sig   blood glucose meter kit and supplies Dispense based on patient and insurance preference. Use up to four times daily as directed. (FOR ICD-10 E11.65)   glucose blood (ONETOUCH ULTRA) test strip Use 1 strip to check glucose 3 times daily as directed.   hydrALAZINE (APRESOLINE) 25 MG tablet Take 1 tablet by mouth 3 (three) times daily.   Insulin Pen Needle (BD PEN NEEDLE NANO 2ND GEN) 32G X 4 MM MISC 1 Package by Does not apply route 2 (two) times daily.   levothyroxine (SYNTHROID) 75 MCG tablet Take 1 tablet (75 mcg total) by mouth daily before breakfast.   losartan (COZAAR) 100 MG tablet Take  100 mg by mouth daily.   metFORMIN (GLUCOPHAGE) 1000 MG tablet Take 1 tablet (1,000 mg total) by mouth daily with breakfast.   metoprolol (LOPRESSOR) 100 MG tablet Take 100 mg by mouth 2 (two) times daily.    NOVOLIN 70/30 KWIKPEN (70-30) 100 UNIT/ML KwikPen Inject 25 Units into the skin in the morning and at bedtime.   OneTouch Delica Lancets 30G MISC 1 each by Does not apply route 3 (three) times daily. Use to check blood glucose three times daily   Semaglutide, 1 MG/DOSE, 4 MG/3ML SOPN Inject 1 mg as directed once a week.   simvastatin (ZOCOR) 20 MG tablet Take 20 mg by mouth daily.   Vitamin D, Ergocalciferol, (DRISDOL) 1.25 MG (50000 UNIT) CAPS capsule Take 1 capsule (50,000 Units total) by mouth every 7 (seven) days.   tadalafil (CIALIS) 20 MG tablet Use either 1 tab po daily prn or 1/2 tab po with 1/2 tab sildenafil 100mg  po daily prn (Patient not taking: Reported on 11/18/2022)   No facility-administered encounter medications on file as of 11/18/2022.    Allergies (verified) Lisinopril   History: Past Medical History:  Diagnosis Date   Colon polyps    Diabetes mellitus without complication (HCC)    Edema of both lower extremities    Fatty liver    Hypercholesteremia    Hypertension    Past Surgical History:  Procedure Laterality Date   COLONOSCOPY     COLONOSCOPY  04/21/2012   Procedure: COLONOSCOPY;  Surgeon: Malissa Hippo, MD;  [facility]: [facility];  Service: Endoscopy;  Laterality: N/A;  1200   COLONOSCOPY N/A 09/09/2017   Procedure: COLONOSCOPY;  Surgeon: Malissa Hippo, MD;  Location: [facility];  Service: Endoscopy;  Laterality: N/A;  1200-rescheduled to 3/14/ @12 :00pm per Dewayne Hatch   HERNIA REPAIR     POLYPECTOMY  09/09/2017   Procedure: POLYPECTOMY;  Surgeon: Malissa Hippo, MD;  Location: [facility];  Service: Endoscopy;;  Transverse Colon (CB)   Family History  Problem Relation Age of Onset   Hypertension Mother    Hypertension Father    Alcoholism  Father    Obesity Father    Diabetes Sister    Hypertension Sister    Healthy Daughter    Healthy Son    Colon cancer Neg Hx    Social History   Socioeconomic History   Marital status: Married    Spouse name: Not on file   Number of children: 2   Years of education: Not on file   Highest education level: Not on file  Occupational History   Occupation: retired  Tobacco Use   Smoking status: Former    Packs/day: 0.50    Years: 20.00    Additional pack years: 0.00    Total pack years: 10.00    Types: Cigarettes   Smokeless tobacco: Never  Vaping Use   Vaping Use: Never used  Substance and Sexual Activity   Alcohol use: Yes    Alcohol/week: 4.0 standard drinks of alcohol    Types: 4 Standard drinks or equivalent per week   Drug use: No   Sexual activity: Not on file  Other Topics Concern   Not on file  Social History Narrative   Not on file   Social Determinants of Health   Financial Resource Strain: Low Risk  (11/18/2022)   Overall Financial Resource Strain (CARDIA)    Difficulty of Paying Living Expenses: Not hard at all  Food Insecurity: No Food Insecurity (11/18/2022)   Hunger Vital Sign    Worried About Running Out of Food in the Last Year: Never true    Ran Out of Food in the Last Year: Never true  Transportation Needs: No Transportation Needs (11/18/2022)   PRAPARE - Administrator, Civil Service (Medical): No    Lack of Transportation (Non-Medical): No  Physical Activity: Sufficiently Active (11/18/2022)   Exercise Vital Sign    Days of Exercise per Week: 7 days    Minutes of Exercise per Session: 30 min  Stress: No Stress Concern Present (11/18/2022)   [employer] of Occupational Health - Occupational Stress Questionnaire    Feeling of Stress : Not at all  Social Connections: Socially Integrated (11/18/2022)   Social Connection and Isolation Panel [NHANES]    Frequency of Communication with Friends and Family: More than three times a week     Frequency of Social Gatherings with Friends and Family: More than three times a week    Attends Religious Services: More than 4 times per year    Active Member of [employer] or Organizations: Yes    Attends Engineer, structural: More than 4 times per year    Marital Status: Married    Tobacco Counseling Counseling given: Yes   Clinical Intake:  Pre-visit preparation completed: Yes        BMI - recorded: 37.25 Nutritional Status: BMI > 30  Obese Nutritional Risks: None Diabetes: Yes CBG done?: Yes (patient provided glucose  which was 113 today) CBG resulted in Enter/ Edit results?: No (patient provided verbally) Did pt. bring in CBG monitor from home?: No (telephone visit)  How often do you need to have someone help you when you read instructions, pamphlets, or other written materials from your doctor or pharmacy?: 1 - Never  Diabetic?yes  Nutrition Risk Assessment:  Has the patient had any N/V/D within the last 2 months?  No  Does the patient have any non-healing wounds?  No  Has the patient had any unintentional weight loss or weight gain?  No   Diabetes:  Is the patient diabetic?  Yes  If diabetic, was a CBG obtained today?  No  patient verbally provided at glucose from yesterday which was 113 Did the patient bring in their glucometer from home?  No  How often do you monitor your CBG's? Every morning.   Financial Strains and Diabetes Management:  Are you having any financial strains with the device, your supplies or your medication? No .  Does the patient want to be seen by Chronic Care Management for management of their diabetes?  No  Would the patient like to be referred to a Nutritionist or for Diabetic Management?  No   Diabetic Exams:  Diabetic Eye Exam: Overdue for diabetic eye exam. Pt has been advised about the importance in completing this exam. Patient advised to call and schedule an eye exam. Diabetic Foot Exam: Completed  08/28/2022   Interpreter Needed?: No  Information entered by :: Abby Donshay Lupinski, CMA   Activities of Daily Living    11/18/2022   10:10 AM  In your present state of health, do you have any difficulty performing the following activities:  Hearing? 0  Vision? 0  Difficulty concentrating or making decisions? 0  Walking or climbing stairs? 0  Dressing or bathing? 0  Doing errands, shopping? 0  Preparing Food and eating ? N  Using the Toilet? N  In the past six months, have you accidently leaked urine? N  Do you have problems with loss of bowel control? N  Managing your Medications? N  Managing your Finances? N  Housekeeping or managing your Housekeeping? N    Patient Care Team: Billie Lade, MD as PCP - General (Internal Medicine)  Indicate any recent Medical Services you may have received from other than [facility] providers in the past year (date may be approximate).     Assessment:   This is a routine wellness examination for Gerald Evans.  Hearing/Vision screen Hearing Screening - Comments:: Patient denies any hearing difficulties.   Vision Screening - Comments:: Patient states they wear reading glasses only.    Dietary issues and exercise activities discussed: Current Exercise Habits: Home exercise routine, Type of exercise: walking, Time (Minutes): 30, Frequency (Times/Week): 7, Weekly Exercise (Minutes/Week): 210, Exercise limited by: orthopedic condition(s)   Goals Addressed   None    Depression Screen    11/18/2022   10:08 AM 09/29/2022   11:48 AM 03/04/2021    7:50 AM 02/05/2020    1:16 PM 09/01/2018   10:15 AM  PHQ 2/9 Scores  PHQ - 2 Score 0 3 0 0 0  PHQ- 9 Score 0 6 2      Fall Risk    11/18/2022   10:07 AM 09/29/2022   11:47 AM 03/21/2020    3:31 PM 02/05/2020    1:16 PM 09/01/2018   10:15 AM  Fall Risk   Falls in the past year? 0 0 0 0 0  Number falls in past yr: 0 0 0 0   Injury with Fall? 0 0 0 0   Risk for fall due to : No Fall Risks No Fall Risks     Follow  up Falls prevention discussed Falls evaluation completed       FALL RISK PREVENTION PERTAINING TO THE HOME:  Any stairs in or around the home? No  If so, are there any without handrails? No  Home free of loose throw rugs in walkways, pet beds, electrical cords, etc? Yes  Adequate lighting in your home to reduce risk of falls? Yes   ASSISTIVE DEVICES UTILIZED TO PREVENT FALLS:  Life alert? No  Use of a cane, walker or w/c? No  Grab bars in the bathroom? No  Shower chair or bench in shower? No  Elevated toilet seat or a handicapped toilet? No   TIMED UP AND GO:  Was the test performed? No .   Cognitive Function:        11/18/2022   10:11 AM  6CIT Screen  What Year? 0 points  What month? 0 points  What time? 0 points  Count back from 20 0 points  Months in reverse 0 points  Repeat phrase 0 points  Total Score 0 points    Immunizations Immunization History  Administered Date(s) Administered   PFIZER Comirnaty(Gray Top)Covid-19 Tri-Sucrose Vaccine 11/12/2020   PFIZER(Purple Top)SARS-COV-2 Vaccination 08/20/2019, 09/12/2019, 04/25/2020   PNEUMOCOCCAL CONJUGATE-20 01/06/2022   Pfizer Covid-19 Vaccine Bivalent Booster 26yrs & up 07/17/2021   Pneumococcal Polysaccharide-23 05/10/2018   Tdap 12/20/2017   Zoster Recombinat (Shingrix) 01/06/2022, 03/18/2022    TDAP status: Up to date  Flu Vaccine status: Up to date  Pneumococcal vaccine status: Up to date  Covid-19 vaccine status: Information provided on how to obtain vaccines.   Qualifies for Shingles Vaccine? No   Zostavax completed Yes   Shingrix Completed?: Yes  Screening Tests Health Maintenance  Topic Date Due   OPHTHALMOLOGY EXAM  12/25/2021   COVID-19 Vaccine (6 - 2023-24 season) 02/27/2022   HEMOGLOBIN A1C  01/21/2023   INFLUENZA VACCINE  01/28/2023   Diabetic kidney evaluation - Urine ACR  05/12/2023   Diabetic kidney evaluation - eGFR measurement  07/24/2023   FOOT EXAM  09/09/2023   Medicare  Annual Wellness (AWV)  11/18/2023   COLONOSCOPY (Pts 45-34yrs Insurance coverage will need to be confirmed)  09/09/2024   DTaP/Tdap/Td (2 - Td or Tdap) 12/21/2027   Pneumonia Vaccine 58+ Years old  Completed   Hepatitis C Screening  Completed   Zoster Vaccines- Shingrix  Completed   HPV VACCINES  Aged Out    Health Maintenance  Health Maintenance Due  Topic Date Due   OPHTHALMOLOGY EXAM  12/25/2021   COVID-19 Vaccine (6 - 2023-24 season) 02/27/2022    Colorectal cancer screening: Type of screening: Colonoscopy. Completed 09/09/2017. Repeat every 7 years  Lung Cancer Screening: (Low Dose CT Chest recommended if Age 64-80 years, 30 pack-year currently smoking OR have quit w/in 15years.) does not qualify.   Additional Screening:  Hepatitis C Screening: does qualify; Completed 11/10/2016  Vision Screening: Recommended annual ophthalmology exams for early detection of glaucoma and other disorders of the eye. Is the patient up to date with their annual eye exam?  Yes  Who is the provider or what is the name of the office in which the patient attends annual eye exams? Patient couldn't remember  Dental Screening: Recommended annual dental exams for proper oral hygiene  [facility]  Referral / Chronic Care Management: CRR required this visit?  No   CCM required this visit?  No      Plan:     I have personally reviewed and noted the following in the patient's chart:   Medical and social history Use of alcohol, tobacco or illicit drugs  Current medications and supplements including opioid prescriptions. Patient is not currently taking opioid prescriptions. Functional ability and status Nutritional status Physical activity Advanced directives List of other physicians Hospitalizations, surgeries, and ER visits in previous 12 months Vitals Screenings to include cognitive, depression, and falls Referrals and appointments  In addition, I have reviewed and discussed with  patient certain preventive protocols, quality metrics, and best practice recommendations. A written personalized care plan for preventive services as well as general preventive health recommendations were provided to patient.    Due to this being a telephonic visit, the after visit summary with patients personalized plan was offered to patient via mail or my-chart. Patient would like to access their AVS via my-chart   Jordan Hawks Makalya Nave, CMA   11/18/2022   Nurse Notes:

## 2022-11-19 ENCOUNTER — Ambulatory Visit: Payer: Medicare HMO | Admitting: Internal Medicine

## 2022-11-20 ENCOUNTER — Ambulatory Visit (INDEPENDENT_AMBULATORY_CARE_PROVIDER_SITE_OTHER): Payer: Medicare HMO | Admitting: Internal Medicine

## 2022-11-20 ENCOUNTER — Encounter: Payer: Self-pay | Admitting: Internal Medicine

## 2022-11-20 VITALS — BP 145/78 | HR 79 | Ht 68.0 in | Wt 247.4 lb

## 2022-11-20 DIAGNOSIS — I10 Essential (primary) hypertension: Secondary | ICD-10-CM | POA: Diagnosis not present

## 2022-11-20 MED ORDER — HYDRALAZINE HCL 50 MG PO TABS
50.0000 mg | ORAL_TABLET | Freq: Three times a day (TID) | ORAL | 1 refills | Status: DC
Start: 2022-11-20 — End: 2023-01-11

## 2022-11-20 NOTE — Assessment & Plan Note (Signed)
Presenting today for HTN follow-up.  BP remains mildly elevated, 148/83 initially and 145/78 on repeat.  His current antihypertensive regimen includes losartan 100 mg daily, metoprolol tartrate 100 mg twice daily, and hydralazine 25 mg 3 times daily.  He has previously experienced adverse side effects with amlodipine (lower extremity edema) and HCTZ (SOB). -Increase hydralazine to 50 mg 3 times daily -Continue losartan and metoprolol as previously prescribed -Follow-up in 1 month for HTN check

## 2022-11-20 NOTE — Progress Notes (Signed)
Established Patient Office Visit  Subjective   Patient ID: Gerald Evans, male    DOB: [birth year]  Age: 77 y.o. MRN: [id number]  Chief Complaint  Patient presents with   Hypertension    Follow up    Gerald Evans returns to care today for HTN follow-up.  He was last evaluated by me on 09/1998 patient presenting to establish care.  His blood pressure was elevated at that time, however when reviewing his antihypertensive medication regimen he reported that he was only taking hydralazine twice daily.  Recommended taking 3 times daily as previously prescribed in addition to metoprolol to tartrate and losartan.  In the interim he has been seen by healthy weight and wellness on 2 occasions. Gerald Evans reports feeling well today.  He is asymptomatic and has no additional concerns to discuss.  He has been taking his antihypertensive medications as prescribed.  Past Medical History:  Diagnosis Date   Colon polyps    Diabetes mellitus without complication (HCC)    Edema of both lower extremities    Fatty liver    Hypercholesteremia    Hypertension    Past Surgical History:  Procedure Laterality Date   COLONOSCOPY     COLONOSCOPY  04/21/2012   Procedure: COLONOSCOPY;  Surgeon: Malissa Hippo, MD;  [facility];  Service: Endoscopy;  Laterality: N/A;  1200   COLONOSCOPY N/A 09/09/2017   Procedure: COLONOSCOPY;  Surgeon: Malissa Hippo, MD;  Location: [facility];  Service: Endoscopy;  Laterality: N/A;  1200-rescheduled to 3/14/ @12 :00pm per Dewayne Hatch   HERNIA REPAIR     POLYPECTOMY  09/09/2017   Procedure: POLYPECTOMY;  Surgeon: Malissa Hippo, MD;  Location: [facility];  Service: Endoscopy;;  Transverse Colon (CB)   Social History   Tobacco Use   Smoking status: Former    Packs/day: 0.50    Years: 20.00    Additional pack years: 0.00    Total pack years: 10.00    Types: Cigarettes   Smokeless tobacco: Never  Vaping Use   Vaping Use: Never used  Substance Use Topics    Alcohol use: Yes    Alcohol/week: 4.0 standard drinks of alcohol    Types: 4 Standard drinks or equivalent per week   Drug use: No   Family History  Problem Relation Age of Onset   Hypertension Mother    Hypertension Father    Alcoholism Father    Obesity Father    Diabetes Sister    Hypertension Sister    Healthy Daughter    Healthy Son    Colon cancer Neg Hx    Allergies  Allergen Reactions   Lisinopril     cough      Review of Systems  Constitutional:  Negative for chills and fever.  HENT:  Negative for sore throat.   Respiratory:  Negative for cough and shortness of breath.   Cardiovascular:  Negative for chest pain, palpitations and leg swelling.  Gastrointestinal:  Negative for abdominal pain, blood in stool, constipation, diarrhea, nausea and vomiting.  Genitourinary:  Negative for dysuria and hematuria.  Musculoskeletal:  Negative for myalgias.  Skin:  Negative for itching and rash.  Neurological:  Negative for dizziness and headaches.  Psychiatric/Behavioral:  Negative for depression and suicidal ideas.      Objective:     BP (!) 145/78   Pulse 79   Ht 5\' 8"  (1.727 m)   Wt 247 lb 6.4 oz (112.2 kg)   SpO2  93%   BMI 37.62 kg/m  BP Readings from Last 3 Encounters:  11/20/22 (!) 145/78  11/18/22 (!) 149/73  10/28/22 (!) 149/73   Physical Exam Vitals reviewed.  Constitutional:      General: He is not in acute distress.    Appearance: Normal appearance. He is obese. He is not ill-appearing.  HENT:     Head: Normocephalic and atraumatic.     Right Ear: External ear normal.     Left Ear: External ear normal.     Nose: Nose normal. No congestion or rhinorrhea.     Mouth/Throat:     Mouth: Mucous membranes are moist.     Pharynx: Oropharynx is clear.  Eyes:     General: No scleral icterus.    Extraocular Movements: Extraocular movements intact.     Conjunctiva/sclera: Conjunctivae normal.     Pupils: Pupils are equal, round, and reactive to light.   Cardiovascular:     Rate and Rhythm: Normal rate and regular rhythm.     Pulses: Normal pulses.     Heart sounds: Normal heart sounds. No murmur heard. Pulmonary:     Effort: Pulmonary effort is normal.     Breath sounds: Normal breath sounds. No wheezing, rhonchi or rales.  Abdominal:     General: Abdomen is flat. Bowel sounds are normal. There is no distension.     Palpations: Abdomen is soft.     Tenderness: There is no abdominal tenderness.     Hernia: A hernia (Ventral hernia) is present.  Musculoskeletal:        General: No swelling or deformity. Normal range of motion.     Cervical back: Normal range of motion.  Skin:    General: Skin is warm and dry.     Capillary Refill: Capillary refill takes less than 2 seconds.  Neurological:     General: No focal deficit present.     Mental Status: He is alert and oriented to person, place, and time.     Motor: No weakness.  Psychiatric:        Mood and Affect: Mood normal.        Behavior: Behavior normal.        Thought Content: Thought content normal.   Last CBC Lab Results  Component Value Date   WBC 7.5 07/23/2022   HGB 12.5 (L) 07/23/2022   HCT 39.0 07/23/2022   MCV 74 (L) 07/23/2022   MCH 23.8 (L) 07/23/2022   RDW 18.3 (H) 07/23/2022   PLT 218 07/23/2022   Last metabolic panel Lab Results  Component Value Date   GLUCOSE 100 (H) 07/23/2022   NA 139 07/23/2022   K 3.9 07/23/2022   CL 104 07/23/2022   CO2 20 07/23/2022   BUN 12 07/23/2022   CREATININE 1.28 (H) 07/23/2022   EGFR 58 (L) 07/23/2022   CALCIUM 9.2 07/23/2022   PROT 6.5 07/23/2022   ALBUMIN 4.2 07/23/2022   LABGLOB 2.3 07/23/2022   AGRATIO 1.8 07/23/2022   BILITOT 0.3 07/23/2022   ALKPHOS 83 07/23/2022   AST 20 07/23/2022   ALT 16 07/23/2022   ANIONGAP 11 08/21/2020   Last lipids Lab Results  Component Value Date   CHOL 128 07/23/2022   HDL 41 07/23/2022   LDLCALC 69 07/23/2022   TRIG 96 07/23/2022   CHOLHDL 3.4 05/07/2022   Last  hemoglobin A1c Lab Results  Component Value Date   HGBA1C 7.8 (H) 07/23/2022   Last thyroid functions Lab Results  Component Value  Date   TSH 5.100 (H) 07/23/2022   T3TOTAL 98 03/04/2021   Last vitamin D Lab Results  Component Value Date   VD25OH 48.0 07/23/2022   Last vitamin B12 and Folate Lab Results  Component Value Date   VITAMINB12 422 07/23/2022   FOLATE 16.9 05/25/2016     Assessment & Plan:   Problem List Items Addressed This Visit       Essential hypertension - Primary    Presenting today for HTN follow-up.  BP remains mildly elevated, 148/83 initially and 145/78 on repeat.  His current antihypertensive regimen includes losartan 100 mg daily, metoprolol tartrate 100 mg twice daily, and hydralazine 25 mg 3 times daily.  He has previously experienced adverse side effects with amlodipine (lower extremity edema) and HCTZ (SOB). -Increase hydralazine to 50 mg 3 times daily -Continue losartan and metoprolol as previously prescribed -Follow-up in 1 month for HTN check      Return in about 4 weeks (around 12/18/2022) for HTN.   Billie Lade, MD

## 2022-11-20 NOTE — Patient Instructions (Signed)
It was a pleasure to see you today.  Thank you for giving [place] the opportunity to be involved in your care.  Below is a brief recap of your visit and next steps.  We will plan to see you again in 1 month.  Summary Increase hydralazine to 50 mg tree times daily No additional medication changes Follow up in 1 month

## 2022-11-25 ENCOUNTER — Ambulatory Visit (INDEPENDENT_AMBULATORY_CARE_PROVIDER_SITE_OTHER): Payer: Medicare HMO | Admitting: Family Medicine

## 2022-11-25 ENCOUNTER — Encounter (INDEPENDENT_AMBULATORY_CARE_PROVIDER_SITE_OTHER): Payer: Self-pay | Admitting: Family Medicine

## 2022-11-25 VITALS — BP 128/67 | HR 78 | Temp 97.9°F | Ht 68.0 in | Wt 244.0 lb

## 2022-11-25 DIAGNOSIS — Z6837 Body mass index (BMI) 37.0-37.9, adult: Secondary | ICD-10-CM

## 2022-11-25 DIAGNOSIS — E669 Obesity, unspecified: Secondary | ICD-10-CM

## 2022-11-25 DIAGNOSIS — E559 Vitamin D deficiency, unspecified: Secondary | ICD-10-CM | POA: Diagnosis not present

## 2022-11-25 DIAGNOSIS — Z794 Long term (current) use of insulin: Secondary | ICD-10-CM

## 2022-11-25 DIAGNOSIS — E1165 Type 2 diabetes mellitus with hyperglycemia: Secondary | ICD-10-CM | POA: Diagnosis not present

## 2022-11-25 DIAGNOSIS — Z7984 Long term (current) use of oral hypoglycemic drugs: Secondary | ICD-10-CM

## 2022-11-25 DIAGNOSIS — Z7985 Long-term (current) use of injectable non-insulin antidiabetic drugs: Secondary | ICD-10-CM

## 2022-11-25 MED ORDER — SEMAGLUTIDE (1 MG/DOSE) 4 MG/3ML ~~LOC~~ SOPN: 1.0000 mg | PEN_INJECTOR | SUBCUTANEOUS | 0 refills | Status: DC

## 2022-11-25 MED ORDER — VITAMIN D (ERGOCALCIFEROL) 1.25 MG (50000 UNIT) PO CAPS
50000.0000 [IU] | ORAL_CAPSULE | ORAL | 0 refills | Status: DC
Start: 2022-11-25 — End: 2022-12-21

## 2022-11-25 NOTE — Progress Notes (Signed)
Chief Complaint:   OBESITY Gerald Evans is here to discuss his progress with his obesity treatment plan along with follow-up of his obesity related diagnoses. Gerald Evans is on the Category 3 Plan and states he is following his eating plan approximately 60% of the time. Gerald Evans states he is walking 45 minutes 3 times per week.  Today's visit was #: 24 Starting weight: 242 lb Starting date: 03/04/2021 Today's weight: 244 lb Today's date: 11/25/2022 Total lbs lost to date: 0 Total lbs lost since last in-office visit: +1 lb  Interim History: [place] went to [place] to see his granddaughter graduate law school from [place].  He was up in [place] for 1 week and then he returned back home.  Foodwise he has been eating somewhat different.  He is eating garbanzo beans and 2 oz of steak frequently and fish as well.  Doesn't really weigh the protein much as he knows what 4oz looks like. He recognizes that he may be a bit short on protein at supper.  Next few weeks he is staying home and so he believes that following the Category 3 will be the easiest to do.  Rest of summer he will be in [place] and may go to [place] in August.   Subjective:   1. Type 2 diabetes mellitus with hyperglycemia, with long-term current use of insulin (HCC) Patient is Humulin 70/30, on 25 units and FBS 130's.  No hypoglycemic events on Ozempic 1 mg, metformin and humulin.   2. Vitamin D deficiency Patient is on prescription Vitamin D.  Patient denies nausea, vomiting, muscle weakness, but is positive for fatigue.  Assessment/Plan:   1. Type 2 diabetes mellitus with hyperglycemia, with long-term current use of insulin (HCC) Refill - Semaglutide, 1 MG/DOSE, 4 MG/3ML SOPN; Inject 1 mg as directed once a week.  Dispense: 9 mL; Refill: 0  2. Vitamin D deficiency Check labs at next appointment.   Refill - Vitamin D, Ergocalciferol, (DRISDOL) 1.25 MG (50000 UNIT) CAPS capsule; Take 1 capsule (50,000 Units total) by mouth every 7  (seven) days.  Dispense: 4 capsule; Refill: 0  3. BMI 37.0-37.9, adult  4. Obesity with starting BMI of 36.8 Gerald Evans is currently in the action stage of change. As such, his goal is to continue with weight loss efforts. He has agreed to the Category 3 Plan.   Exercise goals: All adults should avoid inactivity. Some physical activity is better than none, and adults who participate in any amount of physical activity gain some health benefits.  Behavioral modification strategies: increasing lean protein intake, meal planning and cooking strategies, keeping healthy foods in the home, and planning for success.  Gerald Evans has agreed to follow-up with our clinic in 4 weeks. He was informed of the importance of frequent follow-up visits to maximize his success with intensive lifestyle modifications for his multiple health conditions.   Objective:   Blood pressure 128/67, pulse 78, temperature 97.9 F (36.6 C), height 5\' 8"  (1.727 m), weight 244 lb (110.7 kg), SpO2 97 %. Body mass index is 37.1 kg/m.  General: Cooperative, alert, well developed, in no acute distress. HEENT: Conjunctivae and lids unremarkable. Cardiovascular: Regular rhythm.  Lungs: Normal work of breathing. Neurologic: No focal deficits.   Lab Results  Component Value Date   CREATININE 1.28 (H) 07/23/2022   BUN 12 07/23/2022   NA 139 07/23/2022   K 3.9 07/23/2022   CL 104 07/23/2022   CO2 20 07/23/2022   Lab Results  Component Value  Date   ALT 16 07/23/2022   AST 20 07/23/2022   ALKPHOS 83 07/23/2022   BILITOT 0.3 07/23/2022   Lab Results  Component Value Date   HGBA1C 7.8 (H) 07/23/2022   HGBA1C 7.8 (A) 05/11/2022   HGBA1C 8.6 01/07/2022   HGBA1C 9.7 (A) 10/08/2021   HGBA1C 10.1 07/16/2021   Lab Results  Component Value Date   INSULIN 25.6 (H) 07/23/2022   Lab Results  Component Value Date   TSH 5.100 (H) 07/23/2022   Lab Results  Component Value Date   CHOL 128 07/23/2022   HDL 41 07/23/2022    LDLCALC 69 07/23/2022   TRIG 96 07/23/2022   CHOLHDL 3.4 05/07/2022   Lab Results  Component Value Date   VD25OH 48.0 07/23/2022   VD25OH 45.3 12/01/2021   VD25OH 34.1 03/04/2021   Lab Results  Component Value Date   WBC 7.5 07/23/2022   HGB 12.5 (L) 07/23/2022   HCT 39.0 07/23/2022   MCV 74 (L) 07/23/2022   PLT 218 07/23/2022   Lab Results  Component Value Date   IRON 54 07/23/2022   TIBC 285 07/23/2022   FERRITIN 124 07/23/2022   Attestation Statements:   Reviewed by clinician on day of visit: allergies, medications, problem list, medical history, surgical history, family history, social history, and previous encounter notes.  I, Gerald Evans, RMA, am acting as transcriptionist for Gerald Likes, MD.  I have reviewed the above documentation for accuracy and completeness, and I agree with the above. - Gerald Likes, MD

## 2022-11-30 ENCOUNTER — Ambulatory Visit (INDEPENDENT_AMBULATORY_CARE_PROVIDER_SITE_OTHER): Payer: Medicare HMO | Admitting: Gastroenterology

## 2022-12-04 ENCOUNTER — Ambulatory Visit (HOSPITAL_COMMUNITY)
Admission: RE | Admit: 2022-12-04 | Discharge: 2022-12-04 | Disposition: A | Discharge status: Home / Self Care | Payer: Medicare HMO | Source: Ambulatory Visit | Attending: Gastroenterology | Admitting: Gastroenterology

## 2022-12-04 DIAGNOSIS — K7581 Nonalcoholic steatohepatitis (NASH): Secondary | ICD-10-CM | POA: Insufficient documentation

## 2022-12-09 ENCOUNTER — Telehealth: Payer: Self-pay | Admitting: *Deleted

## 2022-12-09 NOTE — Telephone Encounter (Signed)
Pt called for ultrasound results. I let him know chelsea has not received results yet and we would call him after she reviews. He verbalized understanding  [phone]

## 2022-12-10 NOTE — Telephone Encounter (Signed)
See result note from today. Pt was notified of results

## 2022-12-21 ENCOUNTER — Encounter (INDEPENDENT_AMBULATORY_CARE_PROVIDER_SITE_OTHER): Payer: Self-pay | Admitting: Family Medicine

## 2022-12-21 ENCOUNTER — Ambulatory Visit (INDEPENDENT_AMBULATORY_CARE_PROVIDER_SITE_OTHER): Payer: Medicare HMO | Admitting: Family Medicine

## 2022-12-21 VITALS — BP 129/67 | HR 81 | Temp 97.9°F | Ht 68.0 in | Wt 250.0 lb

## 2022-12-21 DIAGNOSIS — E669 Obesity, unspecified: Secondary | ICD-10-CM | POA: Diagnosis not present

## 2022-12-21 DIAGNOSIS — Z6838 Body mass index (BMI) 38.0-38.9, adult: Secondary | ICD-10-CM | POA: Diagnosis not present

## 2022-12-21 DIAGNOSIS — E1165 Type 2 diabetes mellitus with hyperglycemia: Secondary | ICD-10-CM | POA: Diagnosis not present

## 2022-12-21 DIAGNOSIS — E559 Vitamin D deficiency, unspecified: Secondary | ICD-10-CM

## 2022-12-21 DIAGNOSIS — Z6837 Body mass index (BMI) 37.0-37.9, adult: Secondary | ICD-10-CM

## 2022-12-21 DIAGNOSIS — Z7984 Long term (current) use of oral hypoglycemic drugs: Secondary | ICD-10-CM

## 2022-12-21 DIAGNOSIS — Z794 Long term (current) use of insulin: Secondary | ICD-10-CM

## 2022-12-21 MED ORDER — METFORMIN HCL 1000 MG PO TABS: ORAL_TABLET | ORAL | 0 refills | Status: DC

## 2022-12-21 MED ORDER — VITAMIN D (ERGOCALCIFEROL) 1.25 MG (50000 UNIT) PO CAPS: 50000.0000 [IU] | ORAL_CAPSULE | ORAL | 0 refills | Status: DC

## 2022-12-21 NOTE — Progress Notes (Signed)
[name], D.O.  ABFM, ABOM Specializing in Clinical Bariatric Medicine  Office located at: [address] [place], [place]  [address]     Assessment and Plan:   Medications Discontinued During This Encounter  Medication Reason   Vitamin D, Ergocalciferol, (DRISDOL) 1.25 MG (50000 UNIT) CAPS capsule Reorder   metFORMIN (GLUCOPHAGE) 1000 MG tablet Reorder     Meds ordered this encounter  Medications   Vitamin D, Ergocalciferol, (DRISDOL) 1.25 MG (50000 UNIT) CAPS capsule    Sig: Take 1 capsule (50,000 Units total) by mouth every 7 (seven) days.    Dispense:  4 capsule    Refill:  0   metFORMIN (GLUCOPHAGE) 1000 MG tablet    Sig: Take 1000 once daily with lunch OR dinner    Dispense:  30 tablet    Refill:  0    Come fasting 30 minutes prior for Repeat IC next OV  Type 2 diabetes mellitus with hyperglycemia, with long-term current use of insulin (HCC) Assessment: Condition is not optimized. His diabetes mellitus is being treated with Humulin 25 units twice daily and Metformin 1,000 mg daily. He has been off Ozempic the past two wks and is unable to afford the medication.   Lab Results  Component Value Date   HGBA1C 7.8 (H) 07/23/2022   HGBA1C 7.8 (A) 05/11/2022   HGBA1C 8.6 01/07/2022   INSULIN 25.6 (H) 07/23/2022    Plan: Continue with all medications as recommended by Dr.Reardon. We may consider increasing Metformin 12 hr formula to twice daily in the future.   Educated patient that having protein with each meal is important for stabilizing sugars and an important part of his diabetes management as well as controlling hunger and cravings.   Continue his prudent nutritional plan that is low in simple carbohydrates, saturated fats and trans fats to goal of 5-10% weight loss to achieve significant health benefits.  Pt encouraged to continually advance exercise and cardiovascular fitness as tolerated throughout weight loss journey. Will continue to monitor  condition closely.    Vitamin D deficiency Assessment: Condition is being treated with Ergocalciferol 50K IU weekly  and he is tolerating supplement. No concerns.   Lab Results  Component Value Date   VD25OH 48.0 07/23/2022   VD25OH 45.3 12/01/2021   VD25OH 34.1 03/04/2021   Plan: Continue with supplement at current dose. Will refill ERGO  today.    Weight loss will likely improve availability of vitamin D, thus encouraged Kyriakos to continue with meal plan and their weight loss efforts to further improve this condition.  Thus, we will need to monitor levels regularly (every 3-4 mo on average) to keep levels within normal limits and prevent over supplementation.   TREATMENT PLAN FOR OBESITY: BMI 37.0-37.9, adult - current BMI 38.02  Obesity with starting BMI of 36.8 Assessment: JAYVYN HASELTON is here to discuss his progress with his obesity treatment plan along with follow-up of his obesity related diagnoses. See Medical Weight Management Flowsheet for complete bioelectrical impedance results.  Condition is worsening. Biometric data collected today, was reviewed with patient.   Since last office visit on 11/25/22 patient's  Muscle mass has increased by 0.8 lb. Fat mass has increased by 4.6 lb. Total body water has increased by 6.8 lb. These biometric suggest that pt has been eating more fatty carbohydrates lately. Counseling done on how various foods will affect these numbers and how to maximize success  Total lbs lost to date: + 8  Total  weight loss percentage to date: + 3.31   Plan: Switched from Category 3 meal plan with breakfast and lunch options to Category 2 meal plan with only 100 snack calories.  His last RMR was 1498 on 03/04/21.  - I reviewed the Category 2 meal plan with the pt and highly encouraged him to focus on his lean protein intake and avoid fatty carbs, such as chicken wings and chicken fingers.   Behavioral Intervention Additional resources provided today:  category 2 meal plan information, breakfast options, and lunch options, Seasoning Handout Evidence-based interventions for health behavior change were utilized today including the discussion of self monitoring techniques, problem-solving barriers and SMART goal setting techniques.   Regarding patient's less desirable eating habits and patterns, we employed the technique of small changes.  Pt will specifically work on: measuring and eating all his lean proteins and following meal plan closely for next visit.    Recommended Physical Activity Goals  [place] has been advised to slowly work up to 150 minutes of moderate intensity aerobic activity a week and strengthening exercises 2-3 times per week for cardiovascular health, weight loss maintenance and preservation of muscle mass.   He has agreed to Continue current level of physical activity   FOLLOW UP: Return in about 4 weeks (around 01/18/2023). He was informed of the importance of frequent follow up visits to maximize his success with intensive lifestyle modifications for his multiple health conditions.  Subjective:   Chief complaint: Obesity Tyri is here to discuss his progress with his obesity treatment plan. He is on  the Category 3 Plan and states he is following his eating plan approximately 50% of the time. He states he is walking 30 minutes 3 days per week.  Interval History:  DJIMON LUNDSTROM is here for a follow up office visit.  This is my first time meeting Ramar;he usually meets Dr.Ukleja.   Since last OV, pt endorses that he has not been following the meal plan very closely. One of his biggest challenges has been late night snacking. He does not measure his lean protein intake and has been eating off plan foods such as chicken wings and chicken fingers.   Pharmacotherapy for weight loss: He is currently taking Metformin for medical weight loss.  Denies side effects.    Review of Systems:  Pertinent positives were addressed  with patient today.  Reviewed by clinician on day of visit: allergies, medications, problem list, medical history, surgical history, family history, social history, and previous encounter notes.  Weight Summary and Biometrics   Weight Lost Since Last Visit: 0  Weight Gained Since Last Visit: 6lb    Vitals Temp: 97.9 F (36.6 C) BP: 129/67 Pulse Rate: 81 SpO2: 97 %   Anthropometric Measurements Height: 5\' 8"  (1.727 m) Weight: 250 lb (113.4 kg) BMI (Calculated): 38.02 Weight at Last Visit: 244lb Weight Lost Since Last Visit: 0 Weight Gained Since Last Visit: 6lb Starting Weight: 242lb   Body Composition  Body Fat %: 37.2 % Fat Mass (lbs): 93.2 lbs Muscle Mass (lbs): 149.6 lbs Total Body Water (lbs): 122 lbs Visceral Fat Rating : 25   Other Clinical Data Fasting: no Labs: no Today's Visit #: 24 Starting Date: 03/04/21   Objective:   PHYSICAL EXAM: Blood pressure 129/67, pulse 81, temperature 97.9 F (36.6 C), height 5\' 8"  (1.727 m), weight 250 lb (113.4 kg), SpO2 97 %. Body mass index is 38.01 kg/m.  General: Well Developed, well nourished, and in no acute distress.  HEENT: Normocephalic, atraumatic Skin: Warm and dry, cap RF less 2 sec, good turgor Chest:  Normal excursion, shape, no gross abn Respiratory: speaking in full sentences, no conversational dyspnea NeuroM-Sk: Ambulates w/o assistance, moves * 4 Psych: A and O *3, insight good, mood-full  DIAGNOSTIC DATA REVIEWED:  BMET    Component Value Date/Time   NA 139 07/23/2022 0921   K 3.9 07/23/2022 0921   CL 104 07/23/2022 0921   CO2 20 07/23/2022 0921   GLUCOSE 100 (H) 07/23/2022 0921   GLUCOSE 234 (H) 08/21/2020 1437   BUN 12 07/23/2022 0921   CREATININE 1.28 (H) 07/23/2022 0921   CREATININE 1.00 03/18/2020 1321   CALCIUM 9.2 07/23/2022 0921   GFRNONAA >60 08/21/2020 1437   GFRNONAA 74 03/18/2020 1321   GFRAA 67 07/17/2020 0953   GFRAA 86 03/18/2020 1321   Lab Results  Component  Value Date   HGBA1C 7.8 (H) 07/23/2022   HGBA1C 8.5 10/10/2019   Lab Results  Component Value Date   INSULIN 25.6 (H) 07/23/2022   Lab Results  Component Value Date   TSH 5.100 (H) 07/23/2022   CBC    Component Value Date/Time   WBC 7.5 07/23/2022 0921   WBC 13.6 (H) 08/21/2020 1437   RBC 5.26 07/23/2022 0921   RBC 4.85 08/21/2020 1437   HGB 12.5 (L) 07/23/2022 0921   HCT 39.0 07/23/2022 0921   PLT 218 07/23/2022 0921   MCV 74 (L) 07/23/2022 0921   MCH 23.8 (L) 07/23/2022 0921   MCH 24.3 (L) 08/21/2020 1437   MCHC 32.1 07/23/2022 0921   MCHC 33.1 08/21/2020 1437   RDW 18.3 (H) 07/23/2022 0921   Iron Studies    Component Value Date/Time   IRON 54 07/23/2022 0921   TIBC 285 07/23/2022 0921   FERRITIN 124 07/23/2022 0921   IRONPCTSAT 19 07/23/2022 0921   IRONPCTSAT 24 05/25/2016 0814   Lipid Panel     Component Value Date/Time   CHOL 128 07/23/2022 0921   CHOL 163 05/11/2022 1217   TRIG 96 07/23/2022 0921   TRIG 125 05/11/2022 1217   HDL 41 07/23/2022 0921   CHOLHDL 3.4 05/07/2022 1053   CHOLHDL 3.1 03/18/2020 1321   LDLCALC 69 07/23/2022 0921   LDLCALC 74 03/18/2020 1321   Hepatic Function Panel     Component Value Date/Time   PROT 6.5 07/23/2022 0921   ALBUMIN 4.2 07/23/2022 0921   AST 20 07/23/2022 0921   ALT 16 07/23/2022 0921   ALKPHOS 83 07/23/2022 0921   BILITOT 0.3 07/23/2022 0921   BILIDIR 0.1 05/14/2017 1333   IBILI 0.2 05/14/2017 1333      Component Value Date/Time   TSH 5.100 (H) 07/23/2022 0921   Nutritional Lab Results  Component Value Date   VD25OH 48.0 07/23/2022   VD25OH 45.3 12/01/2021   VD25OH 34.1 03/04/2021    Attestations:   I, Special Puri, acting as a Stage manager for [employer], DO., have compiled all relevant documentation for today's office visit on behalf of [name], DO, while in the presence of [employer], DO.  I have reviewed the above documentation for accuracy and completeness, and I agree  with the above. [name], D.O.  The 21st Century Cures Act was signed into law in 2016 which includes the topic of electronic health records.  This provides immediate access to information in MyChart.  This includes consultation notes, operative notes, office notes, lab results and pathology reports.  If you have any  questions about what you read please let us know at your next visit so we can discuss your concerns and take corrective action if need be.  We are right here with you.

## 2022-12-22 ENCOUNTER — Encounter: Payer: Self-pay | Admitting: Internal Medicine

## 2022-12-22 ENCOUNTER — Ambulatory Visit (INDEPENDENT_AMBULATORY_CARE_PROVIDER_SITE_OTHER): Payer: Medicare HMO | Admitting: Internal Medicine

## 2022-12-22 VITALS — BP 143/70 | HR 69 | Ht 68.0 in | Wt 252.2 lb

## 2022-12-22 DIAGNOSIS — I10 Essential (primary) hypertension: Secondary | ICD-10-CM | POA: Diagnosis not present

## 2022-12-22 MED ORDER — CHLORTHALIDONE 25 MG PO TABS
25.0000 mg | ORAL_TABLET | Freq: Every day | ORAL | 2 refills | Status: DC
Start: 2022-12-22 — End: 2023-08-06

## 2022-12-22 NOTE — Patient Instructions (Signed)
It was a pleasure to see you today.  Thank you for giving [place] the opportunity to be involved in your care.  Below is a brief recap of your visit and next steps.  We will plan to see you again in 3 months.  Summary Add chlorthalidone 25 mg daily for hypertension Continue losartan, metoprolol, and hydralazine at current doses Nurse visit for BP check in 2 weeks Follow up with me in 3 months

## 2022-12-22 NOTE — Assessment & Plan Note (Signed)
Presenting today for HTN for low up.  Hydralazine was increased to 50 mg 3 times daily at his last appointment.  He is additionally prescribed losartan 100 mg daily and metoprolol tartrate 100 mg twice daily.  His blood pressure remains elevated today and is essentially unchanged since his last appointment.  He has previously been prescribed amlodipine and HCTZ.  Amlodipine was discontinued due to lower extremity edema.  During our last discussion, it seemed that HCTZ was discontinued in the setting of shortness of breath, however he believes that he simply ran out of refills and an additional supply of HCTZ was not sent. -Through shared decision making, chlorthalidone 25 mg daily has been added to his antihypertensive regimen.  Continue losartan, metoprolol tartrate, and hydralazine at current doses. -2-week nurse visit for BP check -Routine follow-up with me in 3 months

## 2022-12-22 NOTE — Progress Notes (Signed)
Established Patient Office Visit  Subjective   Patient ID: Gerald Evans, male    DOB: 1946-04-16  Age: 77 y.o. MRN: [id number]  Chief Complaint  Patient presents with   Hypertension    Follow up    Gerald Evans returns here today for HTN follow-up.  He was last evaluated by me on 5/24 at which time hydralazine was increased to 50 mg 3 times daily.  1 month follow-up was arranged for HTN check.  In the interim he has been seen for follow-up at [facility].  Gerald Evans reports feeling well today.  He endorses left flank pain for the past week and believes he has strained a muscle.  He is otherwise asymptomatic and has no acute concerns to discuss today.  Past Medical History:  Diagnosis Date   Colon polyps    Diabetes mellitus without complication (HCC)    Edema of both lower extremities    Fatty liver    Hypercholesteremia    Hypertension    Past Surgical History:  Procedure Laterality Date   COLONOSCOPY     COLONOSCOPY  04/21/2012   Procedure: COLONOSCOPY;  Surgeon: Malissa Hippo, MD;  Location: [facility];  Service: Endoscopy;  Laterality: N/A;  1200   COLONOSCOPY N/A 09/09/2017   Procedure: COLONOSCOPY;  Surgeon: Malissa Hippo, MD;  Location: [facility];  Service: Endoscopy;  Laterality: N/A;  1200-rescheduled to 3/14/ @12 :00pm per Dewayne Hatch   HERNIA REPAIR     POLYPECTOMY  09/09/2017   Procedure: POLYPECTOMY;  Surgeon: Malissa Hippo, MD;  Location: [facility];  Service: Endoscopy;;  Transverse Colon (CB)   Social History   Tobacco Use   Smoking status: Former    Packs/day: 0.50    Years: 20.00    Additional pack years: 0.00    Total pack years: 10.00    Types: Cigarettes   Smokeless tobacco: Never  Vaping Use   Vaping Use: Never used  Substance Use Topics   Alcohol use: Yes    Alcohol/week: 4.0 standard drinks of alcohol    Types: 4 Standard drinks or equivalent per week   Drug use: No   Family History  Problem Relation Age of Onset    Hypertension Mother    Hypertension Father    Alcoholism Father    Obesity Father    Diabetes Sister    Hypertension Sister    Healthy Daughter    Healthy Son    Colon cancer Neg Hx    Allergies  Allergen Reactions   Lisinopril     cough   Review of Systems  Genitourinary:  Positive for flank pain (Left).  All other systems reviewed and are negative.    Objective:     BP (!) 143/70   Pulse 69   Ht 5\' 8"  (1.727 m)   Wt 252 lb 3.2 oz (114.4 kg)   SpO2 93%   BMI 38.35 kg/m  BP Readings from Last 3 Encounters:  12/22/22 (!) 143/70  12/21/22 129/67  11/25/22 128/67   Physical Exam Vitals reviewed.  Constitutional:      General: He is not in acute distress.    Appearance: Normal appearance. He is obese. He is not ill-appearing.  HENT:     Head: Normocephalic and atraumatic.     Right Ear: External ear normal.     Left Ear: External ear normal.     Nose: Nose normal. No congestion or rhinorrhea.     Mouth/Throat:  Mouth: Mucous membranes are moist.     Pharynx: Oropharynx is clear.  Eyes:     General: No scleral icterus.    Extraocular Movements: Extraocular movements intact.     Conjunctiva/sclera: Conjunctivae normal.     Pupils: Pupils are equal, round, and reactive to light.  Cardiovascular:     Rate and Rhythm: Normal rate and regular rhythm.     Pulses: Normal pulses.     Heart sounds: Normal heart sounds. No murmur heard. Pulmonary:     Effort: Pulmonary effort is normal.     Breath sounds: Normal breath sounds. No wheezing, rhonchi or rales.  Abdominal:     General: Abdomen is flat. Bowel sounds are normal. There is no distension.     Palpations: Abdomen is soft.     Tenderness: There is no abdominal tenderness.     Hernia: A hernia (Ventral hernia) is present.  Musculoskeletal:        General: No swelling or deformity. Normal range of motion.     Cervical back: Normal range of motion.  Skin:    General: Skin is warm and dry.     Capillary  Refill: Capillary refill takes less than 2 seconds.  Neurological:     General: No focal deficit present.     Mental Status: He is alert and oriented to person, place, and time.     Motor: No weakness.  Psychiatric:        Mood and Affect: Mood normal.        Behavior: Behavior normal.        Thought Content: Thought content normal.   Last CBC Lab Results  Component Value Date   WBC 7.5 07/23/2022   HGB 12.5 (L) 07/23/2022   HCT 39.0 07/23/2022   MCV 74 (L) 07/23/2022   MCH 23.8 (L) 07/23/2022   RDW 18.3 (H) 07/23/2022   PLT 218 07/23/2022   Last metabolic panel Lab Results  Component Value Date   GLUCOSE 100 (H) 07/23/2022   NA 139 07/23/2022   K 3.9 07/23/2022   CL 104 07/23/2022   CO2 20 07/23/2022   BUN 12 07/23/2022   CREATININE 1.28 (H) 07/23/2022   EGFR 58 (L) 07/23/2022   CALCIUM 9.2 07/23/2022   PROT 6.5 07/23/2022   ALBUMIN 4.2 07/23/2022   LABGLOB 2.3 07/23/2022   AGRATIO 1.8 07/23/2022   BILITOT 0.3 07/23/2022   ALKPHOS 83 07/23/2022   AST 20 07/23/2022   ALT 16 07/23/2022   ANIONGAP 11 08/21/2020   Last lipids Lab Results  Component Value Date   CHOL 128 07/23/2022   HDL 41 07/23/2022   LDLCALC 69 07/23/2022   TRIG 96 07/23/2022   CHOLHDL 3.4 05/07/2022   Last hemoglobin A1c Lab Results  Component Value Date   HGBA1C 7.8 (H) 07/23/2022   Last thyroid functions Lab Results  Component Value Date   TSH 5.100 (H) 07/23/2022   T3TOTAL 98 03/04/2021   Last vitamin D Lab Results  Component Value Date   VD25OH 48.0 07/23/2022   Last vitamin B12 and Folate Lab Results  Component Value Date   VITAMINB12 422 07/23/2022   FOLATE 16.9 05/25/2016     Assessment & Plan:   Problem List Items Addressed This Visit       Essential hypertension - Primary    Presenting today for HTN for low up.  Hydralazine was increased to 50 mg 3 times daily at his last appointment.  He is additionally prescribed losartan 100  mg daily and metoprolol tartrate  100 mg twice daily.  His blood pressure remains elevated today and is essentially unchanged since his last appointment.  He has previously been prescribed amlodipine and HCTZ.  Amlodipine was discontinued due to lower extremity edema.  During our last discussion, it seemed that HCTZ was discontinued in the setting of shortness of breath, however he believes that he simply ran out of refills and an additional supply of HCTZ was not sent. -Through shared decision making, chlorthalidone 25 mg daily has been added to his antihypertensive regimen.  Continue losartan, metoprolol tartrate, and hydralazine at current doses. -2-week nurse visit for BP check and BMP -Routine follow-up with me in 3 months      Return in about 3 months (around 03/24/2023).   Billie Lade, MD

## 2022-12-23 ENCOUNTER — Ambulatory Visit (INDEPENDENT_AMBULATORY_CARE_PROVIDER_SITE_OTHER): Payer: Medicare HMO | Admitting: Family Medicine

## 2023-01-08 ENCOUNTER — Telehealth: Payer: Self-pay

## 2023-01-08 NOTE — Telephone Encounter (Signed)
Patient come by the office to get blood work done and had BP checked. It was 131/69.

## 2023-01-09 LAB — COMPREHENSIVE METABOLIC PANEL
ALT: 15 IU/L (ref 0–44)
AST: 19 IU/L (ref 0–40)
Albumin: 4.2 g/dL (ref 3.8–4.8)
Alkaline Phosphatase: 96 IU/L (ref 44–121)
BUN/Creatinine Ratio: 13 (ref 10–24)
BUN: 18 mg/dL (ref 8–27)
Bilirubin Total: 0.5 mg/dL (ref 0.0–1.2)
CO2: 25 mmol/L (ref 20–29)
Calcium: 9.9 mg/dL (ref 8.6–10.2)
Chloride: 101 mmol/L (ref 96–106)
Creatinine, Ser: 1.35 mg/dL — ABNORMAL HIGH (ref 0.76–1.27)
Globulin, Total: 2.6 g/dL (ref 1.5–4.5)
Glucose: 141 mg/dL — ABNORMAL HIGH (ref 70–99)
Potassium: 4.2 mmol/L (ref 3.5–5.2)
Sodium: 139 mmol/L (ref 134–144)
Total Protein: 6.8 g/dL (ref 6.0–8.5)
eGFR: 54 mL/min/{1.73_m2} — ABNORMAL LOW (ref >59)

## 2023-01-09 LAB — T4, FREE: Free T4: 1.24 ng/dL (ref 0.82–1.77)

## 2023-01-09 LAB — TSH: TSH: 2.25 u[IU]/mL (ref 0.450–4.500)

## 2023-01-11 ENCOUNTER — Ambulatory Visit: Payer: Medicare HMO | Admitting: Nurse Practitioner

## 2023-01-11 ENCOUNTER — Other Ambulatory Visit: Payer: Self-pay | Admitting: Internal Medicine

## 2023-01-11 DIAGNOSIS — E782 Mixed hyperlipidemia: Secondary | ICD-10-CM

## 2023-01-11 DIAGNOSIS — I10 Essential (primary) hypertension: Secondary | ICD-10-CM

## 2023-01-11 DIAGNOSIS — E1165 Type 2 diabetes mellitus with hyperglycemia: Secondary | ICD-10-CM

## 2023-01-11 DIAGNOSIS — E039 Hypothyroidism, unspecified: Secondary | ICD-10-CM

## 2023-01-11 DIAGNOSIS — Z7985 Long-term (current) use of injectable non-insulin antidiabetic drugs: Secondary | ICD-10-CM

## 2023-01-11 DIAGNOSIS — Z794 Long term (current) use of insulin: Secondary | ICD-10-CM

## 2023-01-11 NOTE — Telephone Encounter (Signed)
 Patient aware.

## 2023-01-13 ENCOUNTER — Encounter: Payer: Self-pay | Admitting: Nurse Practitioner

## 2023-01-13 ENCOUNTER — Ambulatory Visit: Payer: Medicare HMO | Admitting: Nurse Practitioner

## 2023-01-13 VITALS — BP 131/66 | HR 65 | Ht 68.0 in | Wt 248.2 lb

## 2023-01-13 DIAGNOSIS — Z7985 Long-term (current) use of injectable non-insulin antidiabetic drugs: Secondary | ICD-10-CM

## 2023-01-13 DIAGNOSIS — Z7984 Long term (current) use of oral hypoglycemic drugs: Secondary | ICD-10-CM

## 2023-01-13 DIAGNOSIS — E1165 Type 2 diabetes mellitus with hyperglycemia: Secondary | ICD-10-CM | POA: Diagnosis not present

## 2023-01-13 DIAGNOSIS — E039 Hypothyroidism, unspecified: Secondary | ICD-10-CM

## 2023-01-13 DIAGNOSIS — I10 Essential (primary) hypertension: Secondary | ICD-10-CM | POA: Diagnosis not present

## 2023-01-13 DIAGNOSIS — E782 Mixed hyperlipidemia: Secondary | ICD-10-CM

## 2023-01-13 DIAGNOSIS — E038 Other specified hypothyroidism: Secondary | ICD-10-CM

## 2023-01-13 DIAGNOSIS — Z794 Long term (current) use of insulin: Secondary | ICD-10-CM

## 2023-01-13 LAB — POCT GLYCOSYLATED HEMOGLOBIN (HGB A1C): Hemoglobin A1C: 8.2 % — AB (ref 4.0–5.6)

## 2023-01-13 MED ORDER — METFORMIN HCL 1000 MG PO TABS: 1000.0000 mg | ORAL_TABLET | Freq: Every day | ORAL | 3 refills | Status: DC

## 2023-01-13 MED ORDER — LEVOTHYROXINE SODIUM 75 MCG PO TABS
75.0000 ug | ORAL_TABLET | Freq: Every day | ORAL | 1 refills | Status: DC
Start: 2023-01-13 — End: 2023-07-20

## 2023-01-13 MED ORDER — NOVOLIN 70/30 FLEXPEN (70-30) 100 UNIT/ML ~~LOC~~ SUPN
25.0000 [IU] | PEN_INJECTOR | Freq: Two times a day (BID) | SUBCUTANEOUS | 3 refills | Status: DC
Start: 2023-01-13 — End: 2023-04-14

## 2023-01-13 NOTE — Addendum Note (Signed)
Addended by: [name] on: 01/13/2023 03:01 PM   Modules accepted: Orders

## 2023-01-13 NOTE — Progress Notes (Signed)
01/13/2023, 2:26 PM  Endocrinology follow-up note   Subjective:    Patient ID: Gerald Evans, male    DOB: [birth year].  Gerald Evans is being seen in follow-up after he was seen in consultation for management of currently uncontrolled symptomatic diabetes requested by  Billie Lade, MD.   Past Medical History:  Diagnosis Date   Colon polyps    Diabetes mellitus without complication (HCC)    Edema of both lower extremities    Fatty liver    Hypercholesteremia    Hypertension     Past Surgical History:  Procedure Laterality Date   COLONOSCOPY     COLONOSCOPY  04/21/2012   Procedure: COLONOSCOPY;  Surgeon: Malissa Hippo, MD;  Location: [facility];  Service: Endoscopy;  Laterality: N/A;  1200   COLONOSCOPY N/A 09/09/2017   Procedure: COLONOSCOPY;  Surgeon: Malissa Hippo, MD;  Location: [facility];  Service: Endoscopy;  Laterality: N/A;  1200-rescheduled to 3/14/ @12 :00pm per Dewayne Hatch   HERNIA REPAIR     POLYPECTOMY  09/09/2017   Procedure: POLYPECTOMY;  Surgeon: Malissa Hippo, MD;  Location: [facility];  Service: Endoscopy;;  Transverse Colon (CB)    Social History   Socioeconomic History   Marital status: Married    Spouse name: Not on file   Number of children: 2   Years of education: Not on file   Highest education level: Not on file  Occupational History   Occupation: retired  Tobacco Use   Smoking status: Former    Current packs/day: 0.50    Average packs/day: 0.5 packs/day for 20.0 years (10.0 ttl pk-yrs)    Types: Cigarettes   Smokeless tobacco: Never  Vaping Use   Vaping status: Never Used  Substance and Sexual Activity   Alcohol use: Yes    Alcohol/week: 4.0 standard drinks of alcohol    Types: 4 Standard drinks or equivalent per week   Drug use: No   Sexual activity: Not on file  Other Topics Concern   Not on file  Social History Narrative   Not on file    Social Determinants of Health   Financial Resource Strain: Low Risk  (11/18/2022)   Overall Financial Resource Strain (CARDIA)    Difficulty of Paying Living Expenses: Not hard at all  Food Insecurity: No Food Insecurity (11/18/2022)   Hunger Vital Sign    Worried About Running Out of Food in the Last Year: Never true    Ran Out of Food in the Last Year: Never true  Transportation Needs: No Transportation Needs (11/18/2022)   PRAPARE - Administrator, Civil Service (Medical): No    Lack of Transportation (Non-Medical): No  Physical Activity: Sufficiently Active (11/18/2022)   Exercise Vital Sign    Days of Exercise per Week: 7 days    Minutes of Exercise per Session: 30 min  Stress: No Stress Concern Present (11/18/2022)   [employer] of Occupational Health - Occupational Stress Questionnaire    Feeling of Stress : Not at all  Social Connections: Socially Integrated (11/18/2022)   Social Connection and Isolation Panel [NHANES]  Frequency of Communication with Friends and Family: More than three times a week    Frequency of Social Gatherings with Friends and Family: More than three times a week    Attends Religious Services: More than 4 times per year    Active Member of [employer] or Organizations: Yes    Attends Engineer, structural: More than 4 times per year    Marital Status: Married    Family History  Problem Relation Age of Onset   Hypertension Mother    Hypertension Father    Alcoholism Father    Obesity Father    Diabetes Sister    Hypertension Sister    Healthy Daughter    Healthy Son    Colon cancer Neg Hx     Outpatient Encounter Medications as of 01/13/2023  Medication Sig   blood glucose meter kit and supplies Dispense based on patient and insurance preference. Use up to four times daily as directed. (FOR ICD-10 E11.65)   chlorthalidone (HYGROTON) 25 MG tablet Take 1 tablet (25 mg total) by mouth daily.   glucose blood (ONETOUCH ULTRA)  test strip Use 1 strip to check glucose 3 times daily as directed.   hydrALAZINE (APRESOLINE) 50 MG tablet TAKE 1 TABLET BY MOUTH THREE TIMES DAILY   Insulin Pen Needle (BD PEN NEEDLE NANO 2ND GEN) 32G X 4 MM MISC 1 Package by Does not apply route 2 (two) times daily.   losartan (COZAAR) 100 MG tablet Take 100 mg by mouth daily.   metoprolol (LOPRESSOR) 100 MG tablet Take 100 mg by mouth 2 (two) times daily.    OneTouch Delica Lancets 30G MISC 1 each by Does not apply route 3 (three) times daily. Use to check blood glucose three times daily   simvastatin (ZOCOR) 20 MG tablet Take 20 mg by mouth daily.   tadalafil (CIALIS) 20 MG tablet Use either 1 tab po daily prn or 1/2 tab po with 1/2 tab sildenafil 100mg  po daily prn   Vitamin D, Ergocalciferol, (DRISDOL) 1.25 MG (50000 UNIT) CAPS capsule Take 1 capsule (50,000 Units total) by mouth every 7 (seven) days.   [DISCONTINUED] levothyroxine (SYNTHROID) 75 MCG tablet TAKE 1 TABLET BY MOUTH ONCE DAILY BEFORE BREAKFAST   [DISCONTINUED] metFORMIN (GLUCOPHAGE) 1000 MG tablet Take 1000 once daily with lunch OR dinner   [DISCONTINUED] NOVOLIN 70/30 KWIKPEN (70-30) 100 UNIT/ML KwikPen Inject 25 Units into the skin in the morning and at bedtime.   levothyroxine (SYNTHROID) 75 MCG tablet Take 1 tablet (75 mcg total) by mouth daily before breakfast.   metFORMIN (GLUCOPHAGE) 1000 MG tablet Take 1 tablet (1,000 mg total) by mouth daily with breakfast. Take 1000 once daily with lunch OR dinner   NOVOLIN 70/30 KWIKPEN (70-30) 100 UNIT/ML KwikPen Inject 25 Units into the skin in the morning and at bedtime.   [DISCONTINUED] Semaglutide, 1 MG/DOSE, 4 MG/3ML SOPN Inject 1 mg as directed once a week. (Patient not taking: Reported on 01/13/2023)   No facility-administered encounter medications on file as of 01/13/2023.    ALLERGIES: Allergies  Allergen Reactions   Lisinopril     cough    VACCINATION STATUS: Immunization History  Administered Date(s) Administered    PFIZER Comirnaty(Gray Top)Covid-19 Tri-Sucrose Vaccine 11/12/2020   PFIZER(Purple Top)SARS-COV-2 Vaccination 08/20/2019, 09/12/2019, 04/25/2020   PNEUMOCOCCAL CONJUGATE-20 01/06/2022   Pfizer Covid-19 Vaccine Bivalent Booster 87yrs & up 07/17/2021   Pneumococcal Polysaccharide-23 05/10/2018   Tdap 12/20/2017   Zoster Recombinant(Shingrix) 01/06/2022, 03/18/2022    Diabetes He presents  for his follow-up diabetic visit. He has type 2 diabetes mellitus. Onset time: He was diagnosed at approximate age of 9 years. His disease course has been worsening. There are no hypoglycemic associated symptoms. Pertinent negatives for hypoglycemia include no confusion, headaches, nervousness/anxiousness, pallor or seizures. Associated symptoms include fatigue. Pertinent negatives for diabetes include no chest pain, no polydipsia, no polyphagia, no polyuria, no weakness and no weight loss. There are no hypoglycemic complications. Symptoms are stable. Diabetic complications include heart disease, impotence and nephropathy. Risk factors for coronary artery disease include dyslipidemia, diabetes mellitus, family history, tobacco exposure, sedentary lifestyle, male sex, obesity and hypertension. Current diabetic treatment includes oral agent (monotherapy) and insulin injections (PCP recently started him on Ozempic but he could not afford it). He is compliant with treatment most of the time. His weight is fluctuating minimally. He is following a generally unhealthy diet. When asked about meal planning, he reported none. He has not had a previous visit with a dietitian. He participates in exercise three times a week. His home blood glucose trend is increasing steadily. His overall blood glucose range is >200 mg/dl. (He presents today with his meter, no logs, showing steady increase in glucose profile.  His POCT A1c today is 8.2%, increasing from last visit of 7.8%.  He notes he was unable to continue to afford the Ozempic as  he went into the donut hole.  Analysis of his meter shows 7-day average of 205, 14-day average of 188, 30-day average of 177.  ) An ACE inhibitor/angiotensin II receptor blocker is being taken. He does not see a podiatrist.Eye exam is current.  Hyperlipidemia This is a chronic problem. The current episode started more than 1 year ago. The problem is controlled. Recent lipid tests were reviewed and are normal. Exacerbating diseases include chronic renal disease, diabetes and obesity. Factors aggravating his hyperlipidemia include fatty foods, beta blockers and thiazides. Pertinent negatives include no chest pain, myalgias or shortness of breath. Current antihyperlipidemic treatment includes statins. The current treatment provides mild improvement of lipids. Compliance problems include adherence to diet and adherence to exercise.  Risk factors for coronary artery disease include diabetes mellitus, dyslipidemia, hypertension, male sex, obesity, a sedentary lifestyle and family history.  Hypertension This is a chronic problem. The current episode started more than 1 year ago. The problem has been waxing and waning since onset. The problem is uncontrolled. Pertinent negatives include no chest pain, headaches, neck pain, palpitations or shortness of breath. There are no associated agents to hypertension. Risk factors for coronary artery disease include dyslipidemia, diabetes mellitus, male gender, obesity and smoking/tobacco exposure. Past treatments include angiotensin blockers, diuretics, beta blockers and central alpha agonists. The current treatment provides mild improvement. There are no compliance problems.  Hypertensive end-organ damage includes kidney disease and CAD/MI. Identifiable causes of hypertension include chronic renal disease and a thyroid problem.  Thyroid Problem Presents for initial visit. Symptoms include fatigue and weight gain. Patient reports no anxiety, cold intolerance, constipation,  depressed mood, hair loss, leg swelling, palpitations or weight loss. The symptoms have been stable. Past treatments include nothing. His past medical history is significant for diabetes, hyperlipidemia and obesity. There are no known risk factors.    Review of systems  Constitutional: + stable body weight,  current Body mass index is 37.74 kg/m. , no fatigue, no subjective hyperthermia, no subjective hypothermia Eyes: no blurry vision, no xerophthalmia ENT: no sore throat, no nodules palpated in throat, no dysphagia/odynophagia, no hoarseness Cardiovascular: no chest pain, no  shortness of breath, no palpitations, no leg swelling Respiratory: no cough, no shortness of breath Gastrointestinal: no nausea/vomiting/diarrhea Musculoskeletal: no muscle/joint aches Skin: no rashes, no hyperemia Neurological: no tremors, no numbness, no tingling, no dizziness Psychiatric: no depression, no anxiety  Objective:    BP 131/66 (BP Location: Left Arm, Patient Position: Sitting, Cuff Size: Large)   Pulse 65   Ht 5\' 8"  (1.727 m)   Wt 248 lb 3.2 oz (112.6 kg)   BMI 37.74 kg/m   Wt Readings from Last 3 Encounters:  01/13/23 248 lb 3.2 oz (112.6 kg)  12/22/22 252 lb 3.2 oz (114.4 kg)  12/21/22 250 lb (113.4 kg)    BP Readings from Last 3 Encounters:  01/13/23 131/66  12/22/22 (!) 143/70  12/21/22 129/67    Physical Exam- Limited  Constitutional:  Body mass index is 37.74 kg/m. , not in acute distress, normal state of mind Eyes:  EOMI, no exophthalmos Musculoskeletal: no gross deformities, strength intact in all four extremities, no gross restriction of joint movements Skin:  no rashes, no hyperemia Neurological: no tremor with outstretched hands  Diabetic Foot Exam - Simple   No data filed     CMP ( most recent) CMP     Component Value Date/Time   NA 139 01/08/2023 1139   K 4.2 01/08/2023 1139   CL 101 01/08/2023 1139   CO2 25 01/08/2023 1139   GLUCOSE 141 (H) 01/08/2023 1139    GLUCOSE 234 (H) 08/21/2020 1437   BUN 18 01/08/2023 1139   CREATININE 1.35 (H) 01/08/2023 1139   CREATININE 1.00 03/18/2020 1321   CALCIUM 9.9 01/08/2023 1139   PROT 6.8 01/08/2023 1139   ALBUMIN 4.2 01/08/2023 1139   AST 19 01/08/2023 1139   ALT 15 01/08/2023 1139   ALKPHOS 96 01/08/2023 1139   BILITOT 0.5 01/08/2023 1139   GFRNONAA >60 08/21/2020 1437   GFRNONAA 74 03/18/2020 1321   GFRAA 67 07/17/2020 0953   GFRAA 86 03/18/2020 1321    Diabetic Labs (most recent): Lab Results  Component Value Date   HGBA1C 8.2 (A) 01/13/2023   HGBA1C 7.8 (H) 07/23/2022   HGBA1C 7.8 (A) 05/11/2022   MICROALBUR 80 mg/l 05/11/2022   MICROALBUR 150 04/15/2021   MICROALBUR 80 03/20/2020     Assessment & Plan:   1) Uncontrolled type 2 diabetes mellitus with hyperglycemia (HCC)  - Gerald Evans has currently uncontrolled symptomatic type 2 DM since  77 years of age.  He presents today with his meter, no logs, showing steady increase in glucose profile.  His POCT A1c today is 8.2%, increasing from last visit of 7.8%.  He notes he was unable to continue to afford the Ozempic as he went into the donut hole.  Analysis of his meter shows 7-day average of 205, 14-day average of 188, 30-day average of 177.    Recent labs reviewed.   - I had a long discussion with him about the progressive nature of diabetes and the pathology behind its complications.  -his diabetes is complicated by obesity/sedentary life and he remains at a high risk for more acute and chronic complications which include CAD, CVA, CKD, retinopathy, and neuropathy. These are all discussed in detail with him.  - Nutritional counseling repeated at each appointment due to patients tendency to fall back in to old habits.  - The patient admits there is a room for improvement in their diet and drink choices. -  Suggestion is made for the patient to avoid simple  carbohydrates from their diet including Cakes, Sweet Desserts /  Pastries, Ice Cream, Soda (diet and regular), Sweet Tea, Candies, Chips, Cookies, Sweet Pastries, Store Bought Juices, Alcohol in Excess of 1-2 drinks a day, Artificial Sweeteners, Coffee Creamer, and "Sugar-free" Products. This will help patient to have stable blood glucose profile and potentially avoid unintended weight gain.   - I encouraged the patient to switch to unprocessed or minimally processed complex starch and increased protein intake (animal or plant source), fruits, and vegetables.   - Patient is advised to stick to a routine mealtimes to eat 3 meals a day and avoid unnecessary snacks (to snack only to correct hypoglycemia).  - I have approached him with the following individualized plan to manage  his diabetes and patient agrees:   - he will continue to require insulin treatment in order for him to achieve and maintain control of diabetes to target.    -He is benefiting from simplified insulin regimen with premixed insulin.    -He is advised to increase his 70/30 to 30 units SQ BID with meals if glucose is above 90 and he is eating.  He can continue Metformin 1000 mg po daily with breakfast (kidneys are stable).  He could not afford copay for Ozempic.  I did give him PAP paperwork to fill out to see if he qualifies for financial assistance.  If he qualifies, he could get his insulin, needles, and Ozempic all from them.  -He is encouraged to do better monitoring his blood glucose.  He is urged to monitor glucose at least twice per day, before injecting insulin (at breakfast and supper) and call the clinic if readings are less than 70 or greater than 300 for 3 tests in a row.  He could benefit from CGM device but is not interested at this time.  - Specific targets for  A1c;  LDL, HDL,  and Triglycerides were discussed with the patient.  2) Blood Pressure /Hypertension:  His blood pressure is controlled to target for his age.  He is advised to continue Clonidine 0.2 mg po twice daily,  HCTZ 25 mg po daily, Losartan 100 mg po daily, and Metoprolol 100 mg po twice daily.    3) Lipids/Hyperlipidemia:  His most recent lipid panel from 03/04/21 shows controlled LDL of 77.  He is advised to continue Simvastatin 20 mg po daily at bedtime.  Side effects and precautions discussed with him.    4)  Weight/Diet:  His Body mass index is 37.74 kg/m.  -   clearly complicating his diabetes care.   he is  a candidate for weight loss. I discussed with him the fact that loss of 5 - 10% of his  current body weight will have the most impact on his diabetes management.  Exercise, and detailed carbohydrates information provided  -  detailed on discharge instructions.  5) Hypothyroidism-acquired He does not have personal or family history of thyroid dysfunction.  His antibody testing was negative ruling out autoimmune thyroid dysfunction.    His most recent TFTs are consistent with appropriate hormone replacement.  He is advised to continue his Levothyroxine 75 mcg po daily before breakfast (recently increased by healthy weight and wellness).     - The correct intake of thyroid hormone (Levothyroxine, Synthroid), is on empty stomach first thing in the morning, with water, separated by at least 30 minutes from breakfast and other medications,  and separated by more than 4 hours from calcium, iron, multivitamins, acid reflux medications (PPIs).  -  This medication is a life-long medication and will be needed to correct thyroid hormone imbalances for the rest of your life.  The dose may change from time to time, based on thyroid blood work.  - It is extremely important to be consistent taking this medication, near the same time each morning.  -AVOID TAKING PRODUCTS CONTAINING BIOTIN (commonly found in Hair, Skin, Nails vitamins) AS IT INTERFERES WITH THE VALIDITY OF THYROID FUNCTION BLOOD TESTS.  6) Chronic Care/Health Maintenance: -he is on ACEI/ARB and Statin medications and is encouraged to initiate  and continue to follow up with Ophthalmology, Dentist,  Podiatrist at least yearly or according to recommendations, and advised to  stay away from smoking. I have recommended yearly flu vaccine and pneumonia vaccine at least every 5 years; moderate intensity exercise for up to 150 minutes weekly; and  sleep for at least 7 hours a day.  - he is advised to maintain close follow up with Durwin Nora, Lucina Mellow, MD for primary care needs, as well as his other providers for optimal and coordinated care.     I spent  41  minutes in the care of the patient today including review of labs from CMP, Lipids, Thyroid Function, Hematology (current and previous including abstractions from other facilities); face-to-face time discussing  his blood glucose readings/logs, discussing hypoglycemia and hyperglycemia episodes and symptoms, medications doses, his options of short and long term treatment based on the latest standards of care / guidelines;  discussion about incorporating lifestyle medicine;  and documenting the encounter. Risk reduction counseling performed per USPSTF guidelines to reduce obesity and cardiovascular risk factors.     Please refer to Patient Instructions for Blood Glucose Monitoring and Insulin/Medications Dosing Guide"  in media tab for additional information. Please  also refer to " Patient Self Inventory" in the Media  tab for reviewed elements of pertinent patient history.  Gerald Evans participated in the discussions, expressed understanding, and voiced agreement with the above plans.  All questions were answered to his satisfaction. he is encouraged to contact clinic should he have any questions or concerns prior to his return visit.  Follow up plan: - Return in about 3 months (around 04/15/2023) for Diabetes F/U with A1c in office, No previsit labs, Bring meter and logs.  Ronny Bacon, [facility] [address] [place], [place] [address] Phone:  [phone] Fax: [phone]  01/13/2023, 2:26 PM

## 2023-01-13 NOTE — Patient Instructions (Signed)

## 2023-01-14 ENCOUNTER — Telehealth: Payer: Self-pay | Admitting: Nurse Practitioner

## 2023-01-14 MED ORDER — INSULIN LISPRO PROT & LISPRO (75-25 MIX) 100 UNIT/ML KWIKPEN: 30.0000 [IU] | PEN_INJECTOR | Freq: Two times a day (BID) | SUBCUTANEOUS | 3 refills | Status: DC

## 2023-01-14 NOTE — Telephone Encounter (Signed)
Notified pts wife for Gerald Evans to call us back for instruction.

## 2023-01-14 NOTE — Telephone Encounter (Signed)
I sent in for 75/25 mixed insulin to [facility] since he cannot find the 70/30.  The directions stay the same as discussed at the last visit.

## 2023-01-14 NOTE — Telephone Encounter (Signed)
Can someone call and let him know this?

## 2023-01-14 NOTE — Telephone Encounter (Signed)
Novolin 70/30 is out at the pharmacy and pt states that no other pharmacy has it.  Do you have anything else that can be called in?

## 2023-01-15 ENCOUNTER — Other Ambulatory Visit: Payer: Self-pay | Admitting: Nurse Practitioner

## 2023-01-19 ENCOUNTER — Other Ambulatory Visit (HOSPITAL_COMMUNITY): Payer: Self-pay

## 2023-01-21 ENCOUNTER — Ambulatory Visit (INDEPENDENT_AMBULATORY_CARE_PROVIDER_SITE_OTHER): Payer: Medicare HMO | Admitting: Family Medicine

## 2023-02-11 ENCOUNTER — Other Ambulatory Visit: Payer: Medicare HMO

## 2023-02-11 DIAGNOSIS — R972 Elevated prostate specific antigen [PSA]: Secondary | ICD-10-CM

## 2023-02-12 LAB — PSA, TOTAL AND FREE
PSA, Free Pct: 17.3 %
PSA, Free: 1.07 ng/mL
Prostate Specific Ag, Serum: 6.2 ng/mL — ABNORMAL HIGH (ref 0.0–4.0)

## 2023-02-18 ENCOUNTER — Ambulatory Visit: Payer: Medicare HMO | Admitting: Urology

## 2023-02-18 ENCOUNTER — Encounter: Payer: Self-pay | Admitting: Urology

## 2023-02-18 VITALS — BP 149/73 | HR 53

## 2023-02-18 DIAGNOSIS — Z87440 Personal history of urinary (tract) infections: Secondary | ICD-10-CM

## 2023-02-18 DIAGNOSIS — N5201 Erectile dysfunction due to arterial insufficiency: Secondary | ICD-10-CM | POA: Diagnosis not present

## 2023-02-18 DIAGNOSIS — N401 Enlarged prostate with lower urinary tract symptoms: Secondary | ICD-10-CM

## 2023-02-18 DIAGNOSIS — R972 Elevated prostate specific antigen [PSA]: Secondary | ICD-10-CM

## 2023-02-18 DIAGNOSIS — N138 Other obstructive and reflux uropathy: Secondary | ICD-10-CM

## 2023-02-18 DIAGNOSIS — R351 Nocturia: Secondary | ICD-10-CM

## 2023-02-18 LAB — URINALYSIS, ROUTINE W REFLEX MICROSCOPIC
Bilirubin, UA: NEGATIVE
Ketones, UA: NEGATIVE
Leukocytes,UA: NEGATIVE
Nitrite, UA: NEGATIVE
RBC, UA: NEGATIVE
Specific Gravity, UA: 1.025 (ref 1.005–1.030)
Urobilinogen, Ur: 0.2 mg/dL (ref 0.2–1.0)
pH, UA: 6 (ref 5.0–7.5)

## 2023-02-18 MED ORDER — SILDENAFIL CITRATE 100 MG PO TABS: ORAL_TABLET | ORAL | 11 refills | Status: DC

## 2023-02-18 NOTE — Progress Notes (Signed)
Subjective: 1. Elevated PSA   2. BPH with urinary obstruction   3. 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 Personal history of urinary infection      02/18/23: Gerald Evans returns today in f/u.  His PSA is up to 6.2 with a 17.3% f/t ration.  He remains on sildenafil for the ED with success.  He continues to void well with nocturia x 1.  He has had no UTI symptoms. HIs UA is clear.  He had COVID a month ago.   08/20/22: Gerald Evans returns today in f/u.  His PSA is stable at 5 with an 18.8% f/t ratio.  He remains on sildenafil for ED with some decline in function. He continues to void well with an IPSS of 2 and nocturia x 1.  He is off of the furosemide.  He has no symptoms to suggest a recurrent UTI.  His UA is clear.   01/15/22: Gerald Evans returns today in f/u for his elevated PSA of 5.  It is down to 4.9 prior to this visit.   His UA is clear.  His IPSS is 4 with nocturia x 2. He is taking an antiacid with the sildenafil which has eliminated the heartburn.   He has been started on furosemide for ankle edema but isn't having too much frequency with that.  He has had prior UTI's but his UA is clear today.   10/23/21: Gerald Evans returns today in f/u for his history of an elevated PSA associated with a UTI.   The PSA was back down to 2.6 after treatment but is back up to 5.0 with an 18.4% f/t ratio prior to this visit.  He has had no recurrent UTI's.  His IPSS today is 5 with nocturia x 2.  He has ED and continues to use sildenafil but he gets heartburn with it.  His UA is unremarkable.  He has an issue with the 3rd finger on the right hand to straightening completely.    10/24/20: Gerald Evans returns today with a repeat PSA which is down to 2.6 with a 26% f/t ratio after completing his antibiotics.   His UA is clear.     GU Hx: Gerald Evans is a 77 yo male who is sent by Gerald Evans for the evaluation of an elevated PSA of 36 on 08/23/20.  He thinks his PSA was about 3.1 in 2021.  He  had been seen in the ER on 2/23 and was found to have a e. Coli UTI.   He was treated with a 7 day course of keflex.  A CT showed changes consistent with cystitis.  He had previously seen Dr. Retta Diones for ED. His UA is clear today.  He has a little residual initial dysuria.  He has moderate LUTS with urgency and occasional UUI. He doesn't always feel empty.  He has a good stream.  He has nocturia x 2.   He has had no prior UTI's or GU surgery.   He is a diabetic and has HTN.  ROS:  Review of Systems  Cardiovascular:  Positive for leg swelling.  All other systems reviewed and are negative.   Allergies  Allergen Reactions   Lisinopril     cough    Past Medical History:  Diagnosis Date   Colon polyps    Diabetes mellitus without complication (HCC)    Edema of both lower extremities    Fatty liver    Hypercholesteremia  Hypertension     Past Surgical History:  Procedure Laterality Date   COLONOSCOPY     COLONOSCOPY  04/21/2012   Procedure: COLONOSCOPY;  Surgeon: Gerald Hippo, MD;  Location: [facility];  Service: Endoscopy;  Laterality: N/A;  1200   COLONOSCOPY N/A 09/09/2017   Procedure: COLONOSCOPY;  Surgeon: Gerald Hippo, MD;  Location: [facility];  Service: Endoscopy;  Laterality: N/A;  1200-rescheduled to 3/14/ @12 :00pm per Dewayne Hatch   HERNIA REPAIR     POLYPECTOMY  09/09/2017   Procedure: POLYPECTOMY;  Surgeon: Gerald Hippo, MD;  Location: [facility];  Service: Endoscopy;;  Transverse Colon (CB)    Social History   Socioeconomic History   Marital status: Married    Spouse name: Not on file   Number of children: 2   Years of education: Not on file   Highest education level: Not on file  Occupational History   Occupation: retired  Tobacco Use   Smoking status: Former    Current packs/day: 0.50    Average packs/day: 0.5 packs/day for 20.0 years (10.0 ttl pk-yrs)    Types: Cigarettes   Smokeless tobacco: Never  Vaping Use   Vaping status: Never Used   Substance and Sexual Activity   Alcohol use: Yes    Alcohol/week: 4.0 standard drinks of alcohol    Types: 4 Standard drinks or equivalent per week   Drug use: No   Sexual activity: Not on file  Other Topics Concern   Not on file  Social History Narrative   Not on file   Social Determinants of Health   Financial Resource Strain: Low Risk  (11/18/2022)   Overall Financial Resource Strain (CARDIA)    Difficulty of Paying Living Expenses: Not hard at all  Food Insecurity: No Food Insecurity (11/18/2022)   Hunger Vital Sign    Worried About Running Out of Food in the Last Year: Never true    Ran Out of Food in the Last Year: Never true  Transportation Needs: No Transportation Needs (11/18/2022)   PRAPARE - Administrator, Civil Service (Medical): No    Lack of Transportation (Non-Medical): No  Physical Activity: Sufficiently Active (11/18/2022)   Exercise Vital Sign    Days of Exercise per Week: 7 days    Minutes of Exercise per Session: 30 min  Stress: No Stress Concern Present (11/18/2022)   [employer] of Occupational Health - Occupational Stress Questionnaire    Feeling of Stress : Not at all  Social Connections: Socially Integrated (11/18/2022)   Social Connection and Isolation Panel [NHANES]    Frequency of Communication with Friends and Family: More than three times a week    Frequency of Social Gatherings with Friends and Family: More than three times a week    Attends Religious Services: More than 4 times per year    Active Member of [employer] or Organizations: Yes    Attends Engineer, structural: More than 4 times per year    Marital Status: Married  Catering manager Violence: Not At Risk (11/18/2022)   Humiliation, Afraid, Rape, and Kick questionnaire    Fear of Current or Ex-Partner: No    Emotionally Abused: No    Physically Abused: No    Sexually Abused: No    Family History  Problem Relation Age of Onset   Hypertension Mother     Hypertension Father    Alcoholism Father    Obesity Father    Diabetes Sister  Hypertension Sister    Healthy Daughter    Healthy Son    Colon cancer Neg Hx     Anti-infectives: Anti-infectives (From admission, onward)    None       Current Outpatient Medications  Medication Sig Dispense Refill   sildenafil (VIAGRA) 100 MG tablet Take 1/2 tab in combination with 10mg  of tadalafil. 30 tablet 11   blood glucose meter kit and supplies Dispense based on patient and insurance preference. Use up to four times daily as directed. (FOR ICD-10 E11.65) 1 each 0   chlorthalidone (HYGROTON) 25 MG tablet Take 1 tablet (25 mg total) by mouth daily. 30 tablet 2   glucose blood (ONETOUCH ULTRA) test strip Use 1 strip to check glucose 3 times daily as directed. 300 each 1   hydrALAZINE (APRESOLINE) 50 MG tablet TAKE 1 TABLET BY MOUTH THREE TIMES DAILY 90 tablet 0   Insulin Lispro Prot & Lispro (HUMALOG MIX 75/25 KWIKPEN) (75-25) 100 UNIT/ML Kwikpen Inject 30 Units into the skin 2 (two) times daily before a meal. 54 mL 3   Insulin Pen Needle (BD PEN NEEDLE NANO 2ND GEN) 32G X 4 MM MISC 1 Package by Does not apply route 2 (two) times daily. 100 each 0   levothyroxine (SYNTHROID) 75 MCG tablet Take 1 tablet (75 mcg total) by mouth daily before breakfast. 90 tablet 1   losartan (COZAAR) 100 MG tablet Take 100 mg by mouth daily.     metFORMIN (GLUCOPHAGE) 1000 MG tablet Take 1 tablet (1,000 mg total) by mouth daily with breakfast. 90 tablet 3   metoprolol (LOPRESSOR) 100 MG tablet Take 100 mg by mouth 2 (two) times daily.      NOVOLIN 70/30 KWIKPEN (70-30) 100 UNIT/ML KwikPen Inject 25 Units into the skin in the morning and at bedtime. 45 mL 3   OneTouch Delica Lancets 30G MISC 1 each by Does not apply route 3 (three) times daily. Use to check blood glucose three times daily 100 each 3   simvastatin (ZOCOR) 20 MG tablet Take 20 mg by mouth daily.     tadalafil (CIALIS) 20 MG tablet Use either 1 tab po  daily prn or 1/2 tab po with 1/2 tab sildenafil 100mg  po daily prn 6 tablet 11   Vitamin D, Ergocalciferol, (DRISDOL) 1.25 MG (50000 UNIT) CAPS capsule Take 1 capsule (50,000 Units total) by mouth every 7 (seven) days. 4 capsule 0   No current facility-administered medications for this visit.     Objective: Vital signs in last 24 hours: BP (!) 149/73   Pulse (!) 53   Intake/Output from previous day: No intake/output data recorded. Intake/Output this shift: @IOTHISSHIFT @   Physical Exam Genitourinary:    Comments: AP NST without mass. Prostate is 2+ benign. SV non-palpable.     Lab Results:  Recent Results (from the past 2160 hour(s))  Comprehensive metabolic panel     Status: Abnormal   Collection Time: 01/08/23 11:39 AM  Result Value Ref Range   Glucose 141 (H) 70 - 99 mg/dL   BUN 18 8 - 27 mg/dL   Creatinine, Ser 1.61 (H) 0.76 - 1.27 mg/dL   eGFR 54 (L) >09 UE/AVW/0.98   BUN/Creatinine Ratio 13 10 - 24   Sodium 139 134 - 144 mmol/L   Potassium 4.2 3.5 - 5.2 mmol/L   Chloride 101 96 - 106 mmol/L   CO2 25 20 - 29 mmol/L   Calcium 9.9 8.6 - 10.2 mg/dL   Total Protein 6.8 6.0 -  8.5 g/dL   Albumin 4.2 3.8 - 4.8 g/dL   Globulin, Total 2.6 1.5 - 4.5 g/dL   Bilirubin Total 0.5 0.0 - 1.2 mg/dL   Alkaline Phosphatase 96 44 - 121 IU/L   AST 19 0 - 40 IU/L   ALT 15 0 - 44 IU/L  TSH     Status: None   Collection Time: 01/08/23 11:39 AM  Result Value Ref Range   TSH 2.250 0.450 - 4.500 uIU/mL  T4, free     Status: None   Collection Time: 01/08/23 11:39 AM  Result Value Ref Range   Free T4 1.24 0.82 - 1.77 ng/dL  HgB Z6X     Status: Abnormal   Collection Time: 01/13/23  1:59 PM  Result Value Ref Range   Hemoglobin A1C 8.2 (A) 4.0 - 5.6 %   HbA1c POC (<> result, manual entry)     HbA1c, POC (prediabetic range)     HbA1c, POC (controlled diabetic range)    PSA, total and free     Status: Abnormal   Collection Time: 02/11/23 11:49 AM  Result Value Ref Range    Prostate Specific Ag, Serum 6.2 (H) 0.0 - 4.0 ng/mL    Comment: Roche ECLIA methodology. According to the American Urological Association, Serum PSA should decrease and remain at undetectable levels after radical prostatectomy. The AUA defines biochemical recurrence as an initial PSA value 0.2 ng/mL or greater followed by a subsequent confirmatory PSA value 0.2 ng/mL or greater. Values obtained with different assay methods or kits cannot be used interchangeably. Results cannot be interpreted as absolute evidence of the presence or absence of malignant disease.    PSA, Free 1.07 N/A ng/mL    Comment: Roche ECLIA methodology.   PSA, Free Pct 17.3 %    Comment: The table below lists the probability of prostate cancer for men with non-suspicious DRE results and total PSA between 4 and 10 ng/mL, by patient age Damaris Schooner, JAMA 1998, [id number]).                   % Free PSA       50-64 yr        65-75 yr                   0.00-10.00%        56%             55%                  10.01-15.00%        24%             35%                  15.01-20.00%        17%             23%                  20.01-25.00%        10%             20%                       >25.00%         5%              9% Please note:  Catalona et al did not make specific  recommendations regarding the use of               percent free PSA for any other population               of men.   Urinalysis, Routine w reflex microscopic     Status: Abnormal   Collection Time: 02/18/23 10:52 AM  Result Value Ref Range   Specific Gravity, UA 1.025 1.005 - 1.030   pH, UA 6.0 5.0 - 7.5   Color, UA Yellow Yellow   Appearance Ur Clear Clear   Leukocytes,UA Negative Negative   Protein,UA Trace Negative/Trace   Glucose, UA Trace (A) Negative   Ketones, UA Negative Negative   RBC, UA Negative Negative   Bilirubin, UA Negative Negative   Urobilinogen, Ur 0.2 0.2 - 1.0 mg/dL   Nitrite, UA Negative Negative   Microscopic  Examination Comment     Comment: Microscopic not indicated and not performed.   UA is clear.  Studies/Results: No results found.   Assessment/Plan: Elevated PSA.  His PSA is up but he had COVID a month ago.  I will have him return in 3 months for a repeat and if it is still up we will need to consider a biopsy. Marland Kitchen   BPH with BOO.  He only has mild LUTS.   History of UTI/Prostatitis.  UA is clear.   ED.  He is responding tp the combination of tadalafil 20mg   1/2 and sildenafil 100mg  1/2 as needed.     Meds ordered this encounter  Medications   sildenafil (VIAGRA) 100 MG tablet    Sig: Take 1/2 tab in combination with 10mg  of tadalafil.    Dispense:  30 tablet    Refill:  11     Orders Placed This Encounter  Procedures   Urinalysis, Routine w reflex microscopic   PSA, total and free    Standing Status:   Future    Standing Expiration Date:   08/21/2023     Return in about 3 months (around 05/21/2023) for with PSA.    CC: Dr. Avon Gully.      Bjorn Pippin 02/19/2023 [phone] ID: [name], male   DOB: 03-02-1946, 77 y.o.   MRN: [id number]

## 2023-02-22 ENCOUNTER — Ambulatory Visit (INDEPENDENT_AMBULATORY_CARE_PROVIDER_SITE_OTHER): Payer: Medicare HMO | Admitting: Family Medicine

## 2023-03-09 ENCOUNTER — Other Ambulatory Visit: Payer: Self-pay

## 2023-03-09 MED ORDER — SIMVASTATIN 20 MG PO TABS: 20.0000 mg | ORAL_TABLET | Freq: Every day | ORAL | 3 refills | Status: DC

## 2023-03-11 ENCOUNTER — Other Ambulatory Visit: Payer: Self-pay | Admitting: Nurse Practitioner

## 2023-03-11 DIAGNOSIS — E1165 Type 2 diabetes mellitus with hyperglycemia: Secondary | ICD-10-CM

## 2023-03-17 ENCOUNTER — Encounter (INDEPENDENT_AMBULATORY_CARE_PROVIDER_SITE_OTHER): Payer: Self-pay | Admitting: Family Medicine

## 2023-03-17 ENCOUNTER — Ambulatory Visit (INDEPENDENT_AMBULATORY_CARE_PROVIDER_SITE_OTHER): Payer: Medicare HMO | Admitting: Family Medicine

## 2023-03-17 VITALS — BP 144/77 | HR 55 | Temp 98.0°F | Ht 68.0 in | Wt 247.0 lb

## 2023-03-17 DIAGNOSIS — Z794 Long term (current) use of insulin: Secondary | ICD-10-CM

## 2023-03-17 DIAGNOSIS — Z6837 Body mass index (BMI) 37.0-37.9, adult: Secondary | ICD-10-CM | POA: Diagnosis not present

## 2023-03-17 DIAGNOSIS — E559 Vitamin D deficiency, unspecified: Secondary | ICD-10-CM | POA: Diagnosis not present

## 2023-03-17 DIAGNOSIS — Z7984 Long term (current) use of oral hypoglycemic drugs: Secondary | ICD-10-CM

## 2023-03-17 DIAGNOSIS — E669 Obesity, unspecified: Secondary | ICD-10-CM

## 2023-03-17 DIAGNOSIS — E1165 Type 2 diabetes mellitus with hyperglycemia: Secondary | ICD-10-CM

## 2023-03-17 DIAGNOSIS — R0602 Shortness of breath: Secondary | ICD-10-CM

## 2023-03-17 MED ORDER — VITAMIN D (ERGOCALCIFEROL) 1.25 MG (50000 UNIT) PO CAPS
50000.0000 [IU] | ORAL_CAPSULE | ORAL | 0 refills | Status: DC
Start: 2023-03-17 — End: 2023-04-14

## 2023-03-17 NOTE — Progress Notes (Signed)
[name], D.O.  ABFM, ABOM Specializing in Clinical Bariatric Medicine  Office located at: [address] [place], [place]  [address]     Assessment and Plan:  Review labs with pt next OV.  Orders Placed This Encounter  Procedures   VITAMIN D 25 Hydroxy (Vit-D Deficiency, Fractures)   Medications Discontinued During This Encounter  Medication Reason   Vitamin D, Ergocalciferol, (DRISDOL) 1.25 MG (50000 UNIT) CAPS capsule Reorder    Meds ordered this encounter  Medications   Vitamin D, Ergocalciferol, (DRISDOL) 1.25 MG (50000 UNIT) CAPS capsule    Sig: Take 1 capsule (50,000 Units total) by mouth every 7 (seven) days.    Dispense:  4 capsule    Refill:  0    Vitamin D deficiency Assessment: Condition is Not at goal.  This has not been checked since January. He continues ERGO once weekly. He denies any adverse side effects on this. Lab Results  Component Value Date   VD25OH 48.0 07/23/2022   VD25OH 45.3 12/01/2021   VD25OH 34.1 03/04/2021   Plan: - Continue Ergocalciferol 50K IU weekly. I will refill today.   - We will need to monitor levels regularly to keep levels within normal limits and prevent over supplementation. I will recheck this today.    Type 2 diabetes mellitus with hyperglycemia, with long-term current use of insulin (HCC) Assessment: Condition is Not at goal.. He hasn't checked his blood sugar in 3 days. He was on Ozempic prior but because of the cost this was discontinued. He met with his endocrinologist on 01/13/2023 and she increased is insulin and kept him on Metformin as a result. He continues these with no GI upset or N/V/D.  Lab Results  Component Value Date   HGBA1C 8.2 (A) 01/13/2023   HGBA1C 7.8 (H) 07/23/2022   HGBA1C 7.8 (A) 05/11/2022   INSULIN 25.6 (H) 07/23/2022    Plan:  - Continue with Metformin as directed.   - Reminded Gerald Evans if he feels poorly- check Blood Sugar and Blood Pressure at that time.   I feel as pt  would benefit from a continuous glucose monitor, he denies wanting one.   - Recommend that any concerns about medicines should be directed at the prescribing provider  - Continue his prudent nutritional plan that is low in simple carbohydrates, saturated fats and trans fats to goal of 5-10% weight loss to achieve significant health benefits.  Pt encouraged to continually advance exercise and cardiovascular fitness as tolerated throughout weight loss journey.   SOB (shortness of breath) on exertion Assessment:  Gerald Evans does feel that he gets out of breath more easily than he used to when he exercises and worsens with weight gain, improves with wt loss..  This has gotten worse recently with his increase in fat mass.  Gerald Evans denies shortness of breath at rest or orthopnea. Gerald Evans's shortness of breath appears to be obesity related and exercise induced, as they do not appear to have any "red flag" symptoms/ concerns today.  Also, this condition appears to be related to a state of poor cardiovascular conditioning.   Plan:  Obtain labs today and will be reviewed with her at their next office visit.  Indirect Calorimeter completed today to help guide our dietary regimen. It shows a VO2 of 263 and a REE of 1814.  His calculated basal metabolic rate is 0160 thus her measured basal metabolic rate is worse than expected.  Patient agreed to work on weight loss at  this time.  As Gerald Evans progresses through our weight loss program, we will gradually increase exercise as tolerated to treat his current condition.   If Gerald Evans follows our recommendations and loses 5-10% of their weight without improvement of his shortness of breath or if at any time, symptoms become more concerning, they agree to urgently follow up with their PCP/ specialist for further consideration/ evaluation.   Gerald Evans verbalizes agreement with this plan.    TREATMENT PLAN FOR OBESITY: BMI 37.0-37.9, adult - current BMI 37.56 Obesity with  starting BMI of 36.8 Assessment:  Gerald Evans is here to discuss his progress with his obesity treatment plan along with follow-up of his obesity related diagnoses. See Medical Weight Management Flowsheet for complete bioelectrical impedance results.  Condition is not optimized. Biometric data collected today, was reviewed with patient.   Since last office visit on 12/21/2022 patient's  Muscle mass has decreased by 1lb. Fat mass has decreased by 2.6lb. Total body water has decreased by 7.8lb.  Counseling done on how various foods will affect these numbers and how to maximize success  Total lbs lost to date: +5 Total weight loss percentage to date: +2.07%  Plan: - Continue Category 2 meal plan with 100 snack calories and breakfast and lunch options.    Behavioral Intervention Additional resources provided today: category 2 meal plan information, breakfast options, and lunch options Evidence-based interventions for health behavior change were utilized today including the discussion of self monitoring techniques, problem-solving barriers and SMART goal setting techniques.   Regarding patient's less desirable eating habits and patterns, we employed the technique of small changes.  Pt will specifically work on: Walk 3 days a week for for next visit.     He has agreed to Think about ways to increase daily physical activity and overcoming barriers to exercise   FOLLOW UP: Return in about 4 weeks (around 04/14/2023).  He was informed of the importance of frequent follow up visits to maximize his success with intensive lifestyle modifications for his multiple health conditions. Gerald Evans is aware that we will review all of his lab results at our next visit together in person.  He is aware that if anything is critical/ life threatening with the results, we will be contacting him via MyChart or by my CMA will be calling them prior to the office visit to discuss acute management.     Subjective:   Chief complaint: Obesity Gerald Evans is here to discuss his progress with his obesity treatment plan. He is on the the Category 2 Plan with only 100 snack calories and states he is following his eating plan approximately 60% of the time. He states he is not exercising.  Interval History:  Gerald Evans is here for a follow up office visit.     Since last office visit:  Pt was lost to follow up and was last seen on 12/21/2022 and has recently returned from travel over the summer. He endorses not following the prescribed meal plan but is ready to restart and will go get groceries on the list today after this appointment. He knows he has to watch what he eats and exercise now that he is back from vacation. He informed me that he is not exercising as he had to have excess fluid taken out his right knee and will need the left knee done.    Pharmacotherapy for weight loss: He is currently taking  metformin  for medical weight loss.  Denies side effects.  Review of Systems:  Pertinent positives were addressed with patient today.  Reviewed by clinician on day of visit: allergies, medications, problem list, medical history, surgical history, family history, social history, and previous encounter notes.  Weight Summary and Biometrics   Weight Lost Since Last Visit: 3lb  Weight Gained Since Last Visit: 0lb   Vitals Temp: 98 F (36.7 C) BP: (!) 144/77 Pulse Rate: (!) 55 SpO2: 96 %   Anthropometric Measurements Height: 5\' 8"  (1.727 m) Weight: 247 lb (112 kg) BMI (Calculated): 37.56 Weight at Last Visit: 250lb Weight Lost Since Last Visit: 3lb Weight Gained Since Last Visit: 0lb Starting Weight: 242lb Total Weight Loss (lbs): 0 lb (0 kg)   Body Composition  Body Fat %: 36.7 % Fat Mass (lbs): 90.6 lbs Muscle Mass (lbs): 148.6 lbs Total Body Water (lbs): 114.2 lbs Visceral Fat Rating : 25   Other Clinical Data RMR: 1814 Fasting: yes Labs: no Today's Visit #:  25 Starting Date: 03/04/21     Objective:   PHYSICAL EXAM: Blood pressure (!) 144/77, pulse (!) 55, temperature 98 F (36.7 C), height 5\' 8"  (1.727 m), weight 247 lb (112 kg), SpO2 96%. Body mass index is 37.56 kg/m.  General: Well Developed, well nourished, and in no acute distress.  HEENT: Normocephalic, atraumatic Skin: Warm and dry, cap RF less 2 sec, good turgor Chest:  Normal excursion, shape, no gross abn Respiratory: speaking in full sentences, no conversational dyspnea NeuroM-Sk: Ambulates w/o assistance, moves * 4 Psych: A and O *3, insight good, mood-full  DIAGNOSTIC DATA REVIEWED:  BMET    Component Value Date/Time   NA 139 01/08/2023 1139   K 4.2 01/08/2023 1139   CL 101 01/08/2023 1139   CO2 25 01/08/2023 1139   GLUCOSE 141 (H) 01/08/2023 1139   GLUCOSE 234 (H) 08/21/2020 1437   BUN 18 01/08/2023 1139   CREATININE 1.35 (H) 01/08/2023 1139   CREATININE 1.00 03/18/2020 1321   CALCIUM 9.9 01/08/2023 1139   GFRNONAA >60 08/21/2020 1437   GFRNONAA 74 03/18/2020 1321   GFRAA 67 07/17/2020 0953   GFRAA 86 03/18/2020 1321   Lab Results  Component Value Date   HGBA1C 8.2 (A) 01/13/2023   HGBA1C 8.5 10/10/2019   Lab Results  Component Value Date   INSULIN 25.6 (H) 07/23/2022   Lab Results  Component Value Date   TSH 2.250 01/08/2023   CBC    Component Value Date/Time   WBC 7.5 07/23/2022 0921   WBC 13.6 (H) 08/21/2020 1437   RBC 5.26 07/23/2022 0921   RBC 4.85 08/21/2020 1437   HGB 12.5 (L) 07/23/2022 0921   HCT 39.0 07/23/2022 0921   PLT 218 07/23/2022 0921   MCV 74 (L) 07/23/2022 0921   MCH 23.8 (L) 07/23/2022 0921   MCH 24.3 (L) 08/21/2020 1437   MCHC 32.1 07/23/2022 0921   MCHC 33.1 08/21/2020 1437   RDW 18.3 (H) 07/23/2022 0921   Iron Studies    Component Value Date/Time   IRON 54 07/23/2022 0921   TIBC 285 07/23/2022 0921   FERRITIN 124 07/23/2022 0921   IRONPCTSAT 19 07/23/2022 0921   IRONPCTSAT 24 05/25/2016 0814   Lipid  Panel     Component Value Date/Time   CHOL 128 07/23/2022 0921   CHOL 163 05/11/2022 1217   TRIG 96 07/23/2022 0921   TRIG 125 05/11/2022 1217   HDL 41 07/23/2022 0921   CHOLHDL 3.4 05/07/2022 1053   CHOLHDL 3.1 03/18/2020 1321   LDLCALC 69  07/23/2022 0921   LDLCALC 74 03/18/2020 1321   Hepatic Function Panel     Component Value Date/Time   PROT 6.8 01/08/2023 1139   ALBUMIN 4.2 01/08/2023 1139   AST 19 01/08/2023 1139   ALT 15 01/08/2023 1139   ALKPHOS 96 01/08/2023 1139   BILITOT 0.5 01/08/2023 1139   BILIDIR 0.1 05/14/2017 1333   IBILI 0.2 05/14/2017 1333      Component Value Date/Time   TSH 2.250 01/08/2023 1139   Nutritional Lab Results  Component Value Date   VD25OH 48.0 07/23/2022   VD25OH 45.3 12/01/2021   VD25OH 34.1 03/04/2021    Attestations:   I, Clinical biochemist, acting as a Stage manager for [employer], DO., have compiled all relevant documentation for today's office visit on behalf of [name], DO, while in the presence of [employer], DO.  I have reviewed the above documentation for accuracy and completeness, and I agree with the above. [name], D.O.  The 21st Century Cures Act was signed into law in 2016 which includes the topic of electronic health records.  This provides immediate access to information in MyChart.  This includes consultation notes, operative notes, office notes, lab results and pathology reports.  If you have any questions about what you read please let us know at your next visit so we can discuss your concerns and take corrective action if need be.  We are right here with you.

## 2023-03-18 LAB — VITAMIN D 25 HYDROXY (VIT D DEFICIENCY, FRACTURES): Vit D, 25-Hydroxy: 35.4 ng/mL (ref 30.0–100.0)

## 2023-03-19 ENCOUNTER — Telehealth: Payer: Self-pay | Admitting: Nurse Practitioner

## 2023-03-19 MED ORDER — INSULIN LISPRO PROT & LISPRO (75-25 MIX) 100 UNIT/ML KWIKPEN: 40.0000 [IU] | PEN_INJECTOR | Freq: Two times a day (BID) | SUBCUTANEOUS | 3 refills | Status: DC

## 2023-03-19 NOTE — Addendum Note (Signed)
Addended by: [name] on: 03/19/2023 10:24 AM   Modules accepted: Orders

## 2023-03-19 NOTE — Telephone Encounter (Signed)
Pt says insurance won't cover Novolin 70/30 until October.

## 2023-03-19 NOTE — Telephone Encounter (Signed)
He has been on vacation in [place] and eating what he wanted.  He was injecting 50 units BID and therefore ran out before he should.  I will send in higher dose to pharmacy so he can pick some up soon.  I also gave him 3 vials of 70/30 we had here in the office as they are getting near to expiration.  I did increase his dose to 40 units BID and instructed him to get back on track with diet and incorporate more physical exercise.

## 2023-03-22 ENCOUNTER — Other Ambulatory Visit: Payer: Self-pay | Admitting: Nurse Practitioner

## 2023-03-22 MED ORDER — NOVOLOG 70/30 FLEXPEN RELION (70-30) 100 UNIT/ML ~~LOC~~ SUPN: 40.0000 [IU] | PEN_INJECTOR | Freq: Two times a day (BID) | SUBCUTANEOUS | 3 refills | Status: DC

## 2023-03-29 ENCOUNTER — Other Ambulatory Visit: Payer: Self-pay | Admitting: Internal Medicine

## 2023-03-29 DIAGNOSIS — I10 Essential (primary) hypertension: Secondary | ICD-10-CM

## 2023-03-30 ENCOUNTER — Ambulatory Visit: Payer: Medicare HMO | Admitting: Internal Medicine

## 2023-04-09 ENCOUNTER — Other Ambulatory Visit: Payer: Self-pay | Admitting: Internal Medicine

## 2023-04-09 ENCOUNTER — Other Ambulatory Visit (INDEPENDENT_AMBULATORY_CARE_PROVIDER_SITE_OTHER): Payer: Self-pay | Admitting: Family Medicine

## 2023-04-09 DIAGNOSIS — E559 Vitamin D deficiency, unspecified: Secondary | ICD-10-CM

## 2023-04-09 DIAGNOSIS — R079 Chest pain, unspecified: Secondary | ICD-10-CM

## 2023-04-09 DIAGNOSIS — R0602 Shortness of breath: Secondary | ICD-10-CM

## 2023-04-14 ENCOUNTER — Encounter (INDEPENDENT_AMBULATORY_CARE_PROVIDER_SITE_OTHER): Payer: Self-pay | Admitting: Family Medicine

## 2023-04-14 ENCOUNTER — Ambulatory Visit (INDEPENDENT_AMBULATORY_CARE_PROVIDER_SITE_OTHER): Payer: Medicare HMO | Admitting: Family Medicine

## 2023-04-14 VITALS — BP 148/72 | HR 55 | Temp 97.8°F | Ht 68.0 in | Wt 248.0 lb

## 2023-04-14 DIAGNOSIS — E1165 Type 2 diabetes mellitus with hyperglycemia: Secondary | ICD-10-CM

## 2023-04-14 DIAGNOSIS — E1159 Type 2 diabetes mellitus with other circulatory complications: Secondary | ICD-10-CM | POA: Diagnosis not present

## 2023-04-14 DIAGNOSIS — I152 Hypertension secondary to endocrine disorders: Secondary | ICD-10-CM | POA: Diagnosis not present

## 2023-04-14 DIAGNOSIS — E559 Vitamin D deficiency, unspecified: Secondary | ICD-10-CM

## 2023-04-14 DIAGNOSIS — Z7985 Long-term (current) use of injectable non-insulin antidiabetic drugs: Secondary | ICD-10-CM

## 2023-04-14 DIAGNOSIS — E669 Obesity, unspecified: Secondary | ICD-10-CM

## 2023-04-14 DIAGNOSIS — Z6837 Body mass index (BMI) 37.0-37.9, adult: Secondary | ICD-10-CM

## 2023-04-14 DIAGNOSIS — Z794 Long term (current) use of insulin: Secondary | ICD-10-CM | POA: Diagnosis not present

## 2023-04-14 MED ORDER — VITAMIN D (ERGOCALCIFEROL) 1.25 MG (50000 UNIT) PO CAPS
50000.0000 [IU] | ORAL_CAPSULE | ORAL | 0 refills | Status: DC
Start: 2023-04-14 — End: 2023-05-12

## 2023-04-14 NOTE — Progress Notes (Signed)
Gerald Evans, D.O.  ABFM, ABOM Specializing in Clinical Bariatric Medicine  Office located at: [address] [place], [place]  [address]     Assessment and Plan:   Medications Discontinued During This Encounter  Medication Reason   NOVOLIN 70/30 KWIKPEN (70-30) 100 UNIT/ML KwikPen    Vitamin D, Ergocalciferol, (DRISDOL) 1.25 MG (50000 UNIT) CAPS capsule Reorder    Meds ordered this encounter  Medications   Vitamin D, Ergocalciferol, (DRISDOL) 1.25 MG (50000 UNIT) CAPS capsule    Sig: Take 1 capsule (50,000 Units total) by mouth every 7 (seven) days.    Dispense:  4 capsule    Refill:  0    Type 2 diabetes mellitus with hyperglycemia, with long-term current use of insulin (HCC) Assessment & Plan: Lab Results  Component Value Date   HGBA1C 9.4 (A) 04/15/2023   HGBA1C 8.2 (A) 01/13/2023   HGBA1C 7.8 (H) 07/23/2022   INSULIN 25.6 (H) 07/23/2022    Gerald Evans is managing his diabetes with Novolog 40 units BID and Metformin 1000 mg once daily with breakfast. No intolerances reported. Not taking any other weight loss medications. Previously on ozempic but stopped, stating cost became prohibiting when he hit donut hole with medicare. His sugars have been running 130-150s at home. Hunger and cravings are uncontrolled. Pt reports a tendency to snack after dinner while watching TV downstairs. Reviewed kidney function labs, not at goal. When discussing the possibility of starting weight loss medication, pt stated he would prefer to first attempt to manage his snacking by making healthier choices. We discussed going upstairs at an earlier time to avoid easy access to snacks, eating at the table instead of the couch while watching TV, and taking sips of water between bites to slow snacking.   Advised pt to increase protein intake to 6 oz at lunch and 8-10 oz at dinner. Increase resistance training. Encouraged pt to stay hydrated by increasing water intake and controlling blood  sugars by eating on plan, losing weight, and exercising. Continue on current regimen following prudent nutritional meal plan.    Vitamin D deficiency Assessment & Plan: Lab Results  Component Value Date   VD25OH 35.4 03/17/2023   VD25OH 48.0 07/23/2022   VD25OH 45.3 12/01/2021   Reviewed vitamin D levels, below goal. Pt endorses he was off ERGO for a month or so within in the last 3-4 months. Currently taking ERGO 50K units once weekly. No intolerances or adverse side effects reported. Briefly discussed with pt the importance of complying to vitamin D, especially in the winter season. Continue with current treatment plan. Will refill ERGO 50K units today   Hypertension associated with type 2 diabetes mellitus [facility]) Assessment & Plan: BP Readings from Last 3 Encounters:  04/15/23 138/80  04/14/23 (!) 148/72  03/17/23 (!) 144/77   Gerald Evans BP is above goal today, which he attributes to a lack of sleep and not taking his antihypertensives prior to this visit. He monitors his BP at home, reports average readings in 120s/70s. Continue with current medication as instructed by PCP/specialists. We will continue to monitor his condition as it pertains to his weight loss journey.    BMI 37.0-37.9, adult - current BMI 37.72 Obesity with starting BMI of 36.8 Assessment & Plan: Gerald Evans is here to discuss his progress with his obesity treatment plan along with follow-up of his obesity related diagnoses. See Medical Weight Management Flowsheet for complete bioelectrical impedance results.  Since last office  visit on 03/17/23 patient's muscle mass has increased by 1.6 lbs. Fat mass has decreased by 0.2 lbs. No changes to total body water weight.  Counseling done on how various foods will affect these numbers and how to maximize success  Total lbs lost to date: - 6 lbs Total weight loss percentage to date: 2.48 %  Behavioral Intervention Additional resources provided today: mindful  eating habits handout Evidence-based interventions for health behavior change were utilized today including the discussion of self monitoring techniques, problem-solving barriers and SMART goal setting techniques.   Regarding patient's less desirable eating habits and patterns, we employed the technique of small changes.  Pt will specifically work on: Increasing resistance training from 10 to 30 minutes for 3 days a week and eating all proteins for next visit.    FOLLOW UP: Return in about 3 weeks (around 05/05/2023). He was informed of the importance of frequent follow up visits to maximize his success with intensive lifestyle modifications for his multiple health conditions.  Subjective:   Chief complaint: Obesity [name] is here to discuss his progress with his obesity treatment plan. He is on the the Category 2 Plan with 100 snack calories and states he is following his eating plan approximately 60% of the time. He states he is exercising a total of 40 minutes 3 days per week.  Interval History:  Gerald Evans is here for a follow up office visit. Since last OV,  he has struggled with late night snacking. He reports staying up late and snacking while sitting on the couch watching TV after dinner. Snack in the kitchen are more accessible when he is downstairs. States he has no issue with snacking if he goes to sleep upstairs at an earlier time. Usually is eating chips with dips. During his meals he is eating all his protein and finishing his meals at breakfast, lunch, and dinner. He tends to drink Boost protein shakes as a meal substitute for breakfast. Drinks mostly water but has some juice occasionally.    Pharmacotherapy for weight loss: He is currently taking  Metformin 1000 mg daily  for medical weight loss.  Denies side effects.    Review of Systems:  Pertinent positives were addressed with patient today.  Reviewed by clinician on day of visit: allergies, medications, problem list,  medical history, surgical history, family history, social history, and previous encounter notes.  Weight Summary and Biometrics   Weight Lost Since Last Visit: 0lb  Weight Gained Since Last Visit: 1lb   Vitals Temp: 97.8 F (36.6 C) BP: (!) 148/72 Pulse Rate: (!) 55 SpO2: 97 %   Anthropometric Measurements Height: 5\' 8"  (1.727 m) Weight: 248 lb (112.5 kg) BMI (Calculated): 37.72 Weight at Last Visit: 247lb Weight Lost Since Last Visit: 0lb Weight Gained Since Last Visit: 1lb Starting Weight: 242lb Total Weight Loss (lbs): 0 lb (0 kg) Peak Weight: 257lb   Body Composition  Body Fat %: 36.4 % Fat Mass (lbs): 90.4 lbs Muscle Mass (lbs): 150.2 lbs Total Body Water (lbs): 114.2 lbs Visceral Fat Rating : 25   Other Clinical Data Fasting: no Labs: no Today's Visit #: 26 Starting Date: 03/04/21    Objective:   PHYSICAL EXAM: Blood pressure (!) 148/72, pulse (!) 55, temperature 97.8 F (36.6 C), height 5\' 8"  (1.727 m), weight 248 lb (112.5 kg), SpO2 97%. Body mass index is 37.71 kg/m.  General: Well Developed, well nourished, and in no acute distress.  HEENT: Normocephalic, atraumatic Skin: Warm and dry, cap  RF less 2 sec, good turgor Chest:  Normal excursion, shape, no gross abn Respiratory: speaking in full sentences, no conversational dyspnea NeuroM-Sk: Ambulates w/o assistance, moves * 4 Psych: A and O *3, insight good, mood-full  DIAGNOSTIC DATA REVIEWED:  BMET    Component Value Date/Time   NA 139 01/08/2023 1139   K 4.2 01/08/2023 1139   CL 101 01/08/2023 1139   CO2 25 01/08/2023 1139   GLUCOSE 141 (H) 01/08/2023 1139   GLUCOSE 234 (H) 08/21/2020 1437   BUN 18 01/08/2023 1139   CREATININE 1.35 (H) 01/08/2023 1139   CREATININE 1.00 03/18/2020 1321   CALCIUM 9.9 01/08/2023 1139   GFRNONAA >60 08/21/2020 1437   GFRNONAA 74 03/18/2020 1321   GFRAA 67 07/17/2020 0953   GFRAA 86 03/18/2020 1321   Lab Results  Component Value Date   HGBA1C 8.2  (A) 01/13/2023   HGBA1C 8.5 10/10/2019   Lab Results  Component Value Date   INSULIN 25.6 (H) 07/23/2022   Lab Results  Component Value Date   TSH 2.250 01/08/2023   CBC    Component Value Date/Time   WBC 7.5 07/23/2022 0921   WBC 13.6 (H) 08/21/2020 1437   RBC 5.26 07/23/2022 0921   RBC 4.85 08/21/2020 1437   HGB 12.5 (L) 07/23/2022 0921   HCT 39.0 07/23/2022 0921   PLT 218 07/23/2022 0921   MCV 74 (L) 07/23/2022 0921   MCH 23.8 (L) 07/23/2022 0921   MCH 24.3 (L) 08/21/2020 1437   MCHC 32.1 07/23/2022 0921   MCHC 33.1 08/21/2020 1437   RDW 18.3 (H) 07/23/2022 0921   Iron Studies    Component Value Date/Time   IRON 54 07/23/2022 0921   TIBC 285 07/23/2022 0921   FERRITIN 124 07/23/2022 0921   IRONPCTSAT 19 07/23/2022 0921   IRONPCTSAT 24 05/25/2016 0814   Lipid Panel     Component Value Date/Time   CHOL 128 07/23/2022 0921   CHOL 163 05/11/2022 1217   TRIG 96 07/23/2022 0921   TRIG 125 05/11/2022 1217   HDL 41 07/23/2022 0921   CHOLHDL 3.4 05/07/2022 1053   CHOLHDL 3.1 03/18/2020 1321   LDLCALC 69 07/23/2022 0921   LDLCALC 74 03/18/2020 1321   Hepatic Function Panel     Component Value Date/Time   PROT 6.8 01/08/2023 1139   ALBUMIN 4.2 01/08/2023 1139   AST 19 01/08/2023 1139   ALT 15 01/08/2023 1139   ALKPHOS 96 01/08/2023 1139   BILITOT 0.5 01/08/2023 1139   BILIDIR 0.1 05/14/2017 1333   IBILI 0.2 05/14/2017 1333      Component Value Date/Time   TSH 2.250 01/08/2023 1139   Nutritional Lab Results  Component Value Date   VD25OH 35.4 03/17/2023   VD25OH 48.0 07/23/2022   VD25OH 45.3 12/01/2021    Attestations:   I, Isabelle Course, acting as a Stage manager for [name], DO., have compiled all relevant documentation for today's office visit on behalf of [name], DO, while in the presence of [employer], DO.  I have reviewed the above documentation for accuracy and completeness, and I agree with the above. Gerald Evans, D.O.  The 21st Century Cures Act was signed into law in 2016 which includes the topic of electronic health records.  This provides immediate access to information in MyChart.  This includes consultation notes, operative notes, office notes, lab results and pathology reports.  If you have any questions about what you read please let us know at  your next visit so we can discuss your concerns and take corrective action if need be.  We are right here with you.

## 2023-04-15 ENCOUNTER — Ambulatory Visit: Payer: Medicare HMO | Admitting: Nurse Practitioner

## 2023-04-15 ENCOUNTER — Encounter: Payer: Self-pay | Admitting: Nurse Practitioner

## 2023-04-15 VITALS — BP 138/80 | HR 70 | Ht 68.0 in | Wt 251.2 lb

## 2023-04-15 DIAGNOSIS — I10 Essential (primary) hypertension: Secondary | ICD-10-CM

## 2023-04-15 DIAGNOSIS — Z7984 Long term (current) use of oral hypoglycemic drugs: Secondary | ICD-10-CM

## 2023-04-15 DIAGNOSIS — E039 Hypothyroidism, unspecified: Secondary | ICD-10-CM

## 2023-04-15 DIAGNOSIS — E1165 Type 2 diabetes mellitus with hyperglycemia: Secondary | ICD-10-CM

## 2023-04-15 DIAGNOSIS — Z794 Long term (current) use of insulin: Secondary | ICD-10-CM | POA: Diagnosis not present

## 2023-04-15 DIAGNOSIS — E782 Mixed hyperlipidemia: Secondary | ICD-10-CM

## 2023-04-15 DIAGNOSIS — Z7985 Long-term (current) use of injectable non-insulin antidiabetic drugs: Secondary | ICD-10-CM | POA: Diagnosis not present

## 2023-04-15 LAB — POCT GLYCOSYLATED HEMOGLOBIN (HGB A1C): Hemoglobin A1C: 9.4 % — AB (ref 4.0–5.6)

## 2023-04-15 LAB — POCT UA - MICROALBUMIN

## 2023-04-15 NOTE — Progress Notes (Signed)
04/15/2023, 11:33 AM  Endocrinology follow-up note   Subjective:    Patient ID: Gerald Evans, male    DOB: 1945-10-13.  Gerald Evans is being seen in follow-up after he was seen in consultation for management of currently uncontrolled symptomatic diabetes requested by  Billie Lade, MD.   Past Medical History:  Diagnosis Date   Colon polyps    Diabetes mellitus without complication (HCC)    Edema of both lower extremities    Fatty liver    Hypercholesteremia    Hypertension     Past Surgical History:  Procedure Laterality Date   COLONOSCOPY     COLONOSCOPY  04/21/2012   Procedure: COLONOSCOPY;  Surgeon: Malissa Hippo, MD;  Location: [facility];  Service: Endoscopy;  Laterality: N/A;  1200   COLONOSCOPY N/A 09/09/2017   Procedure: COLONOSCOPY;  Surgeon: Malissa Hippo, MD;  Location: [facility];  Service: Endoscopy;  Laterality: N/A;  1200-rescheduled to 3/14/ @12 :00pm per Dewayne Hatch   HERNIA REPAIR     POLYPECTOMY  09/09/2017   Procedure: POLYPECTOMY;  Surgeon: Malissa Hippo, MD;  Location: [facility];  Service: Endoscopy;;  Transverse Colon (CB)    Social History   Socioeconomic History   Marital status: Married    Spouse name: Not on file   Number of children: 2   Years of education: Not on file   Highest education level: Not on file  Occupational History   Occupation: retired  Tobacco Use   Smoking status: Former    Current packs/day: 0.50    Average packs/day: 0.5 packs/day for 20.0 years (10.0 ttl pk-yrs)    Types: Cigarettes   Smokeless tobacco: Never  Vaping Use   Vaping status: Never Used  Substance and Sexual Activity   Alcohol use: Yes    Alcohol/week: 4.0 standard drinks of alcohol    Types: 4 Standard drinks or equivalent per week   Drug use: No   Sexual activity: Not on file  Other Topics Concern   Not on file  Social History Narrative   Not on file    Social Determinants of Health   Financial Resource Strain: Low Risk  (11/18/2022)   Overall Financial Resource Strain (CARDIA)    Difficulty of Paying Living Expenses: Not hard at all  Food Insecurity: No Food Insecurity (11/18/2022)   Hunger Vital Sign    Worried About Running Out of Food in the Last Year: Never true    Ran Out of Food in the Last Year: Never true  Transportation Needs: No Transportation Needs (11/18/2022)   PRAPARE - Administrator, Civil Service (Medical): No    Lack of Transportation (Non-Medical): No  Physical Activity: Sufficiently Active (11/18/2022)   Exercise Vital Sign    Days of Exercise per Week: 7 days    Minutes of Exercise per Session: 30 min  Stress: No Stress Concern Present (11/18/2022)   [employer] of Occupational Health - Occupational Stress Questionnaire    Feeling of Stress : Not at all  Social Connections: Socially Integrated (11/18/2022)   Social Connection and Isolation Panel [NHANES]  Frequency of Communication with Friends and Family: More than three times a week    Frequency of Social Gatherings with Friends and Family: More than three times a week    Attends Religious Services: More than 4 times per year    Active Member of [employer] or Organizations: Yes    Attends Engineer, structural: More than 4 times per year    Marital Status: Married    Family History  Problem Relation Age of Onset   Hypertension Mother    Hypertension Father    Alcoholism Father    Obesity Father    Diabetes Sister    Hypertension Sister    Healthy Daughter    Healthy Son    Colon cancer Neg Hx     Outpatient Encounter Medications as of 04/15/2023  Medication Sig   blood glucose meter kit and supplies Dispense based on patient and insurance preference. Use up to four times daily as directed. (FOR ICD-10 E11.65)   chlorthalidone (HYGROTON) 25 MG tablet Take 1 tablet (25 mg total) by mouth daily.   glucose blood (ONETOUCH ULTRA)  test strip USE 1 STRIP TO CHECK GLUCOSE THREE TIMES DAILY AS DIRECTED E11.65   hydrALAZINE (APRESOLINE) 50 MG tablet TAKE 1 TABLET BY MOUTH THREE TIMES DAILY   insulin aspart protamine - aspart (NOVOLOG 70/30 FLEXPEN) (70-30) 100 UNIT/ML FlexPen Inject 40 Units into the skin 2 (two) times daily with a meal.   Insulin Pen Needle (BD PEN NEEDLE NANO 2ND GEN) 32G X 4 MM MISC 1 Package by Does not apply route 2 (two) times daily.   levothyroxine (SYNTHROID) 75 MCG tablet Take 1 tablet (75 mcg total) by mouth daily before breakfast.   losartan (COZAAR) 100 MG tablet Take 100 mg by mouth daily.   metFORMIN (GLUCOPHAGE) 1000 MG tablet Take 1 tablet (1,000 mg total) by mouth daily with breakfast.   metoprolol (LOPRESSOR) 100 MG tablet Take 100 mg by mouth 2 (two) times daily.    OneTouch Delica Lancets 30G MISC 1 each by Does not apply route 3 (three) times daily. Use to check blood glucose three times daily   sildenafil (VIAGRA) 100 MG tablet Take 1/2 tab in combination with 10mg  of tadalafil.   simvastatin (ZOCOR) 20 MG tablet Take 1 tablet (20 mg total) by mouth daily.   tadalafil (CIALIS) 20 MG tablet Use either 1 tab po daily prn or 1/2 tab po with 1/2 tab sildenafil 100mg  po daily prn   Vitamin D, Ergocalciferol, (DRISDOL) 1.25 MG (50000 UNIT) CAPS capsule Take 1 capsule (50,000 Units total) by mouth every 7 (seven) days.   [DISCONTINUED] NOVOLIN 70/30 KWIKPEN (70-30) 100 UNIT/ML KwikPen Inject 25 Units into the skin in the morning and at bedtime.   [DISCONTINUED] Vitamin D, Ergocalciferol, (DRISDOL) 1.25 MG (50000 UNIT) CAPS capsule Take 1 capsule (50,000 Units total) by mouth every 7 (seven) days.   No facility-administered encounter medications on file as of 04/15/2023.    ALLERGIES: Allergies  Allergen Reactions   Lisinopril     cough    VACCINATION STATUS: Immunization History  Administered Date(s) Administered   PFIZER Comirnaty(Gray Top)Covid-19 Tri-Sucrose Vaccine 11/12/2020    PFIZER(Purple Top)SARS-COV-2 Vaccination 08/20/2019, 09/12/2019, 04/25/2020   PNEUMOCOCCAL CONJUGATE-20 01/06/2022   Pfizer Covid-19 Vaccine Bivalent Booster 43yrs & up 07/17/2021   Pneumococcal Polysaccharide-23 05/10/2018   Tdap 12/20/2017   Zoster Recombinant(Shingrix) 01/06/2022, 03/18/2022    Diabetes He presents for his follow-up diabetic visit. He has type 2 diabetes mellitus. Onset time:  He was diagnosed at approximate age of 58 years. His disease course has been worsening. There are no hypoglycemic associated symptoms. Pertinent negatives for hypoglycemia include no confusion, headaches, nervousness/anxiousness, pallor or seizures. Associated symptoms include fatigue. Pertinent negatives for diabetes include no chest pain, no polydipsia, no polyphagia, no polyuria, no weakness and no weight loss. There are no hypoglycemic complications. Symptoms are stable. Diabetic complications include heart disease, impotence and nephropathy. Risk factors for coronary artery disease include dyslipidemia, diabetes mellitus, family history, tobacco exposure, sedentary lifestyle, male sex, obesity and hypertension. Current diabetic treatment includes oral agent (monotherapy) and insulin injections (PCP recently started him on Ozempic but he could not afford it). He is compliant with treatment most of the time. His weight is fluctuating minimally. He is following a generally unhealthy diet. When asked about meal planning, he reported none. He has not had a previous visit with a dietitian. He participates in exercise three times a week. His home blood glucose trend is increasing steadily. His overall blood glucose range is 140-180 mg/dl. (He presents today with his meter, no logs, showing inconsistent glucose monitoring (only checking glucose on average once daily), with above target glycemic profile.  His POCT A1c today is 9.4%, increasing from last visit of 8.2%.  He admits he went on vacation and ate what he  wanted causing his glucose to spike.  Analysis of his meter shows 7-day average of 142, 14-day average of 169, 30-day average of 183.  He has glucose ranging between 81-378 in his meter. ) An ACE inhibitor/angiotensin II receptor blocker is being taken. He does not see a podiatrist.Eye exam is current.  Hyperlipidemia This is a chronic problem. The current episode started more than 1 year ago. The problem is controlled. Recent lipid tests were reviewed and are normal. Exacerbating diseases include chronic renal disease, diabetes and obesity. Factors aggravating his hyperlipidemia include fatty foods, beta blockers and thiazides. Pertinent negatives include no chest pain, myalgias or shortness of breath. Current antihyperlipidemic treatment includes statins. The current treatment provides mild improvement of lipids. Compliance problems include adherence to diet and adherence to exercise.  Risk factors for coronary artery disease include diabetes mellitus, dyslipidemia, hypertension, male sex, obesity, a sedentary lifestyle and family history.  Hypertension This is a chronic problem. The current episode started more than 1 year ago. The problem has been waxing and waning since onset. The problem is uncontrolled. Pertinent negatives include no chest pain, headaches, neck pain, palpitations or shortness of breath. There are no associated agents to hypertension. Risk factors for coronary artery disease include dyslipidemia, diabetes mellitus, male gender, obesity and smoking/tobacco exposure. Past treatments include angiotensin blockers, diuretics, beta blockers and central alpha agonists. The current treatment provides mild improvement. There are no compliance problems.  Hypertensive end-organ damage includes kidney disease and CAD/MI. Identifiable causes of hypertension include chronic renal disease and a thyroid problem.  Thyroid Problem Presents for initial visit. Symptoms include fatigue and weight gain.  Patient reports no anxiety, cold intolerance, constipation, depressed mood, hair loss, leg swelling, palpitations or weight loss. The symptoms have been stable. Past treatments include nothing. His past medical history is significant for diabetes, hyperlipidemia and obesity. There are no known risk factors.    Review of systems  Constitutional: + increasing body weight,  current Body mass index is 38.19 kg/m. , no fatigue, no subjective hyperthermia, no subjective hypothermia Eyes: no blurry vision, no xerophthalmia ENT: no sore throat, no nodules palpated in throat, no dysphagia/odynophagia, no hoarseness Cardiovascular:  no chest pain, no shortness of breath, no palpitations, no leg swelling Respiratory: no cough, no shortness of breath Gastrointestinal: no nausea/vomiting/diarrhea Musculoskeletal: no muscle/joint aches Skin: no rashes, no hyperemia Neurological: no tremors, no numbness, no tingling, no dizziness Psychiatric: no depression, no anxiety  Objective:    BP 138/80 (BP Location: Left Arm, Patient Position: Sitting, Cuff Size: Large)   Pulse 70   Ht 5\' 8"  (1.727 m)   Wt 251 lb 3.2 oz (113.9 kg)   BMI 38.19 kg/m   Wt Readings from Last 3 Encounters:  04/15/23 251 lb 3.2 oz (113.9 kg)  04/14/23 248 lb (112.5 kg)  03/17/23 247 lb (112 kg)    BP Readings from Last 3 Encounters:  04/15/23 138/80  04/14/23 (!) 148/72  03/17/23 (!) 144/77     Physical Exam- Limited  Constitutional:  Body mass index is 38.19 kg/m. , not in acute distress, normal state of mind Eyes:  EOMI, no exophthalmos Musculoskeletal: no gross deformities, strength intact in all four extremities, no gross restriction of joint movements Skin:  no rashes, no hyperemia Neurological: no tremor with outstretched hands  Diabetic Foot Exam - Simple   No data filed     CMP ( most recent) CMP     Component Value Date/Time   NA 139 01/08/2023 1139   K 4.2 01/08/2023 1139   CL 101 01/08/2023 1139    CO2 25 01/08/2023 1139   GLUCOSE 141 (H) 01/08/2023 1139   GLUCOSE 234 (H) 08/21/2020 1437   BUN 18 01/08/2023 1139   CREATININE 1.35 (H) 01/08/2023 1139   CREATININE 1.00 03/18/2020 1321   CALCIUM 9.9 01/08/2023 1139   PROT 6.8 01/08/2023 1139   ALBUMIN 4.2 01/08/2023 1139   AST 19 01/08/2023 1139   ALT 15 01/08/2023 1139   ALKPHOS 96 01/08/2023 1139   BILITOT 0.5 01/08/2023 1139   GFRNONAA >60 08/21/2020 1437   GFRNONAA 74 03/18/2020 1321   GFRAA 67 07/17/2020 0953   GFRAA 86 03/18/2020 1321    Diabetic Labs (most recent): Lab Results  Component Value Date   HGBA1C 9.4 (A) 04/15/2023   HGBA1C 8.2 (A) 01/13/2023   HGBA1C 7.8 (H) 07/23/2022   MICROALBUR 80mg /L 04/15/2023   MICROALBUR 80 mg/l 05/11/2022   MICROALBUR 150 04/15/2021     Assessment & Plan:   1) Uncontrolled type 2 diabetes mellitus with hyperglycemia (HCC)  - Gerald Evans has currently uncontrolled symptomatic type 2 DM since  77 years of age.  He presents today with his meter, no logs, showing inconsistent glucose monitoring (only checking glucose on average once daily), with above target glycemic profile.  His POCT A1c today is 9.4%, increasing from last visit of 8.2%.  He admits he went on vacation and ate what he wanted causing his glucose to spike.  Analysis of his meter shows 7-day average of 142, 14-day average of 169, 30-day average of 183.  He has glucose ranging between 81-378 in his meter.    Recent labs reviewed.  His POCT UM shows mild microalbuminuria, consistent with his stage 3a kidney disease.  - I had a long discussion with him about the progressive nature of diabetes and the pathology behind its complications.  -his diabetes is complicated by obesity/sedentary life and he remains at a high risk for more acute and chronic complications which include CAD, CVA, CKD, retinopathy, and neuropathy. These are all discussed in detail with him.  - Nutritional counseling repeated at each  appointment due to patients  tendency to fall back in to old habits.  - The patient admits there is a room for improvement in their diet and drink choices. -  Suggestion is made for the patient to avoid simple carbohydrates from their diet including Cakes, Sweet Desserts / Pastries, Ice Cream, Soda (diet and regular), Sweet Tea, Candies, Chips, Cookies, Sweet Pastries, Store Bought Juices, Alcohol in Excess of 1-2 drinks a day, Artificial Sweeteners, Coffee Creamer, and "Sugar-free" Products. This will help patient to have stable blood glucose profile and potentially avoid unintended weight gain.   - I encouraged the patient to switch to unprocessed or minimally processed complex starch and increased protein intake (animal or plant source), fruits, and vegetables.   - Patient is advised to stick to a routine mealtimes to eat 3 meals a day and avoid unnecessary snacks (to snack only to correct hypoglycemia).  - I have approached him with the following individualized plan to manage  his diabetes and patient agrees:   - he will continue to require insulin treatment in order for him to achieve and maintain control of diabetes to target.    -He is benefiting from simplified insulin regimen with premixed insulin.    -He is advised to continue his 70/30 40 units SQ BID with meals if glucose is above 90 and he is eating.  He can continue Metformin 1000 mg po daily with breakfast (kidneys are stable).  He could not afford copay for Ozempic, I gave him a sample today instructed him to start 0.25 mg SQ weekly x 2 doses then increase to 0.5 mg SQ weekly thereafter. I encouraged him to return his PAP paperwork to fill out to see if he qualifies for financial assistance.  If he qualifies, he could get his insulin, needles, and Ozempic all from them.  -He is encouraged to do better monitoring his blood glucose.  He is urged to monitor glucose at least twice per day, before injecting insulin (at breakfast and  supper) and call the clinic if readings are less than 70 or greater than 300 for 3 tests in a row.  He could benefit from CGM device but is not interested at this time.  - Specific targets for  A1c;  LDL, HDL,  and Triglycerides were discussed with the patient.  2) Blood Pressure /Hypertension:  His blood pressure is controlled to target for his age.  He is advised to continue Clonidine 0.2 mg po twice daily, HCTZ 25 mg po daily, Losartan 100 mg po daily, and Metoprolol 100 mg po twice daily.    3) Lipids/Hyperlipidemia:  His most recent lipid panel from 03/04/21 shows controlled LDL of 77.  He is advised to continue Simvastatin 20 mg po daily at bedtime.  Side effects and precautions discussed with him.    4)  Weight/Diet:  His Body mass index is 38.19 kg/m.  -   clearly complicating his diabetes care.   he is  a candidate for weight loss. I discussed with him the fact that loss of 5 - 10% of his  current body weight will have the most impact on his diabetes management.  Exercise, and detailed carbohydrates information provided  -  detailed on discharge instructions.  5) Hypothyroidism-acquired He does not have personal or family history of thyroid dysfunction.  His antibody testing was negative ruling out autoimmune thyroid dysfunction.    There are no recent TFTs to review.  He is advised to continue his Levothyroxine 75 mcg po daily before breakfast (recently  increased by healthy weight and wellness).  Will recheck TFTs prior to next visit and adjust accordingly.   - The correct intake of thyroid hormone (Levothyroxine, Synthroid), is on empty stomach first thing in the morning, with water, separated by at least 30 minutes from breakfast and other medications,  and separated by more than 4 hours from calcium, iron, multivitamins, acid reflux medications (PPIs).  - This medication is a life-long medication and will be needed to correct thyroid hormone imbalances for the rest of your life.  The  dose may change from time to time, based on thyroid blood work.  - It is extremely important to be consistent taking this medication, near the same time each morning.  -AVOID TAKING PRODUCTS CONTAINING BIOTIN (commonly found in Hair, Skin, Nails vitamins) AS IT INTERFERES WITH THE VALIDITY OF THYROID FUNCTION BLOOD TESTS.  6) Chronic Care/Health Maintenance: -he is on ACEI/ARB and Statin medications and is encouraged to initiate and continue to follow up with Ophthalmology, Dentist,  Podiatrist at least yearly or according to recommendations, and advised to  stay away from smoking. I have recommended yearly flu vaccine and pneumonia vaccine at least every 5 years; moderate intensity exercise for up to 150 minutes weekly; and  sleep for at least 7 hours a day.  - he is advised to maintain close follow up with Durwin Nora, Lucina Mellow, MD for primary care needs, as well as his other providers for optimal and coordinated care.     I spent  30  minutes in the care of the patient today including review of labs from CMP, Lipids, Thyroid Function, Hematology (current and previous including abstractions from other facilities); face-to-face time discussing  his blood glucose readings/logs, discussing hypoglycemia and hyperglycemia episodes and symptoms, medications doses, his options of short and long term treatment based on the latest standards of care / guidelines;  discussion about incorporating lifestyle medicine;  and documenting the encounter. Risk reduction counseling performed per USPSTF guidelines to reduce obesity and cardiovascular risk factors.     Please refer to Patient Instructions for Blood Glucose Monitoring and Insulin/Medications Dosing Guide"  in media tab for additional information. Please  also refer to " Patient Self Inventory" in the Media  tab for reviewed elements of pertinent patient history.  Gerald Evans participated in the discussions, expressed understanding, and voiced agreement  with the above plans.  All questions were answered to his satisfaction. he is encouraged to contact clinic should he have any questions or concerns prior to his return visit.  Follow up plan: - Return in about 3 months (around 07/16/2023) for Diabetes F/U with A1c in office, Previsit labs, Bring meter and logs.  Ronny Bacon, [facility] [address] [place], [place] [address] Phone: [phone] Fax: [phone]  04/15/2023, 11:33 AM

## 2023-04-19 ENCOUNTER — Ambulatory Visit
Admission: RE | Admit: 2023-04-19 | Discharge: 2023-04-19 | Disposition: A | Discharge status: Home / Self Care | Payer: Medicare HMO | Source: Ambulatory Visit | Attending: Internal Medicine | Admitting: Internal Medicine

## 2023-04-19 DIAGNOSIS — R079 Chest pain, unspecified: Secondary | ICD-10-CM | POA: Insufficient documentation

## 2023-04-19 DIAGNOSIS — R0602 Shortness of breath: Secondary | ICD-10-CM | POA: Insufficient documentation

## 2023-04-30 ENCOUNTER — Other Ambulatory Visit: Payer: Self-pay | Admitting: Internal Medicine

## 2023-04-30 DIAGNOSIS — I10 Essential (primary) hypertension: Secondary | ICD-10-CM

## 2023-05-07 ENCOUNTER — Encounter: Payer: Self-pay | Admitting: Internal Medicine

## 2023-05-07 ENCOUNTER — Ambulatory Visit: Payer: Medicare HMO | Admitting: Internal Medicine

## 2023-05-07 VITALS — BP 123/54 | HR 63 | Resp 16 | Ht 68.0 in | Wt 250.0 lb

## 2023-05-07 DIAGNOSIS — B351 Tinea unguium: Secondary | ICD-10-CM | POA: Diagnosis not present

## 2023-05-07 DIAGNOSIS — E66812 Obesity, class 2: Secondary | ICD-10-CM

## 2023-05-07 DIAGNOSIS — E782 Mixed hyperlipidemia: Secondary | ICD-10-CM | POA: Diagnosis not present

## 2023-05-07 DIAGNOSIS — E559 Vitamin D deficiency, unspecified: Secondary | ICD-10-CM

## 2023-05-07 DIAGNOSIS — Z23 Encounter for immunization: Secondary | ICD-10-CM | POA: Diagnosis not present

## 2023-05-07 DIAGNOSIS — I10 Essential (primary) hypertension: Secondary | ICD-10-CM | POA: Diagnosis not present

## 2023-05-07 DIAGNOSIS — K7581 Nonalcoholic steatohepatitis (NASH): Secondary | ICD-10-CM

## 2023-05-07 DIAGNOSIS — N1831 Chronic kidney disease, stage 3a: Secondary | ICD-10-CM

## 2023-05-07 DIAGNOSIS — R972 Elevated prostate specific antigen [PSA]: Secondary | ICD-10-CM

## 2023-05-07 DIAGNOSIS — Z6836 Body mass index (BMI) 36.0-36.9, adult: Secondary | ICD-10-CM

## 2023-05-07 DIAGNOSIS — E1165 Type 2 diabetes mellitus with hyperglycemia: Secondary | ICD-10-CM

## 2023-05-07 DIAGNOSIS — I2510 Atherosclerotic heart disease of native coronary artery without angina pectoris: Secondary | ICD-10-CM

## 2023-05-07 MED ORDER — TERBINAFINE HCL 250 MG PO TABS: 250.0000 mg | ORAL_TABLET | Freq: Every day | ORAL | 0 refills | Status: DC

## 2023-05-07 NOTE — Assessment & Plan Note (Signed)
Followed by urology (Dr. Annabell Howells).  He will return to care for follow-up and repeat PSA later this month.  Biopsy to be considered pending results.

## 2023-05-07 NOTE — Patient Instructions (Signed)
It was a pleasure to see you today.  Thank you for giving [place] the opportunity to be involved in your care.  Below is a brief recap of your visit and next steps.  We will plan to see you again in 3 months.  Summary No medication changes today I recommend starting aspirin 81 mg daily in accordance with cardiology recommendations Follow up in 3 months and repeat labs prior to your appointment Flu shot today

## 2023-05-07 NOTE — Assessment & Plan Note (Signed)
Influenza vaccine administered today.

## 2023-05-07 NOTE — Assessment & Plan Note (Signed)
Adequately controlled on current antihypertensive regimen.  No medication changes are indicated today.

## 2023-05-07 NOTE — Assessment & Plan Note (Signed)
Vitamin D level within normal limits when updated in September.  He remains on weekly vitamin D supplementation.

## 2023-05-07 NOTE — Assessment & Plan Note (Signed)
Recently underwent CT coronary calcium with score 643.  ASA and statin therapy recommended.  He is currently prescribed simvastatin 20 mg daily.  I recommended starting ASA 81 mg daily in accordance with cardiology recommendations

## 2023-05-07 NOTE — Progress Notes (Signed)
Established Patient Office Visit  Subjective   Patient ID: Gerald Evans, male    DOB: Jun 05, 1946  Age: 77 y.o. MRN: [id number]  Chief Complaint  Patient presents with   Foot Pain    Right heel pain/stiffness x 3 days.    Gerald Evans returns to care today for routine follow-up.  He was last evaluated by me on 6/25 for HTN follow-up.  Chlorthalidone was added to his antihypertensive regimen at that time.  9-month follow-up arranged.  In the interim, he has been evaluated by endocrinology, urology, and healthy weight and wellness.  Gerald Evans reports feeling fairly well today.  He endorses a 2-day history of right heel pain.  His additional concern is nail changes on his right hand.  Past Medical History:  Diagnosis Date   Colon polyps    Diabetes mellitus without complication (HCC)    Edema of both lower extremities    Fatty liver    Hypercholesteremia    Hypertension    Past Surgical History:  Procedure Laterality Date   COLONOSCOPY     COLONOSCOPY  04/21/2012   Procedure: COLONOSCOPY;  Surgeon: Malissa Hippo, MD;  [facility]: [facility];  Service: Endoscopy;  Laterality: N/A;  1200   COLONOSCOPY N/A 09/09/2017   Procedure: COLONOSCOPY;  Surgeon: Malissa Hippo, MD;  Location: [facility];  Service: Endoscopy;  Laterality: N/A;  1200-rescheduled to 3/14/ @12 :00pm per Dewayne Hatch   HERNIA REPAIR     POLYPECTOMY  09/09/2017   Procedure: POLYPECTOMY;  Surgeon: Malissa Hippo, MD;  Location: [facility];  Service: Endoscopy;;  Transverse Colon (CB)   Social History   Tobacco Use   Smoking status: Former    Current packs/day: 0.50    Average packs/day: 0.5 packs/day for 20.0 years (10.0 ttl pk-yrs)    Types: Cigarettes   Smokeless tobacco: Never  Vaping Use   Vaping status: Never Used  Substance Use Topics   Alcohol use: Yes    Alcohol/week: 4.0 standard drinks of alcohol    Types: 4 Standard drinks or equivalent per week   Drug use: No   Family History  Problem Relation  Age of Onset   Hypertension Mother    Hypertension Father    Alcoholism Father    Obesity Father    Diabetes Sister    Hypertension Sister    Healthy Daughter    Healthy Son    Colon cancer Neg Hx    Allergies  Allergen Reactions   Lisinopril     cough   Review of Systems  Musculoskeletal:        Right heel pain  Skin:        Nail changes on right hand  All other systems reviewed and are negative.    Objective:     BP (!) 123/54   Pulse 63   Resp 16   Ht 5\' 8"  (1.727 m)   Wt 250 lb (113.4 kg)   SpO2 93%   BMI 38.01 kg/m  BP Readings from Last 3 Encounters:  05/07/23 (!) 123/54  04/15/23 138/80  04/14/23 (!) 148/72   Physical Exam Vitals reviewed.  Constitutional:      General: He is not in acute distress.    Appearance: Normal appearance. He is obese. He is not ill-appearing.  HENT:     Head: Normocephalic and atraumatic.     Right Ear: External ear normal.     Left Ear: External ear normal.  Nose: Nose normal. No congestion or rhinorrhea.     Mouth/Throat:     Mouth: Mucous membranes are moist.     Pharynx: Oropharynx is clear.  Eyes:     General: No scleral icterus.    Extraocular Movements: Extraocular movements intact.     Conjunctiva/sclera: Conjunctivae normal.     Pupils: Pupils are equal, round, and reactive to light.  Cardiovascular:     Rate and Rhythm: Normal rate and regular rhythm.     Pulses: Normal pulses.     Heart sounds: Normal heart sounds. No murmur heard. Pulmonary:     Effort: Pulmonary effort is normal.     Breath sounds: Normal breath sounds. No wheezing, rhonchi or rales.  Abdominal:     General: Abdomen is flat. Bowel sounds are normal. There is no distension.     Palpations: Abdomen is soft.     Tenderness: There is no abdominal tenderness.     Hernia: A hernia (Ventral hernia) is present.  Musculoskeletal:        General: No swelling or deformity. Normal range of motion.     Cervical back: Normal range of motion.   Skin:    General: Skin is warm and dry.     Capillary Refill: Capillary refill takes less than 2 seconds.     Comments: Onychomycosis present on fingernails of right hand  Neurological:     General: No focal deficit present.     Mental Status: He is alert and oriented to person, place, and time.     Motor: No weakness.  Psychiatric:        Mood and Affect: Mood normal.        Behavior: Behavior normal.        Thought Content: Thought content normal.   Last CBC Lab Results  Component Value Date   WBC 7.5 07/23/2022   HGB 12.5 (L) 07/23/2022   HCT 39.0 07/23/2022   MCV 74 (L) 07/23/2022   MCH 23.8 (L) 07/23/2022   RDW 18.3 (H) 07/23/2022   PLT 218 07/23/2022   Last metabolic panel Lab Results  Component Value Date   GLUCOSE 141 (H) 01/08/2023   NA 139 01/08/2023   K 4.2 01/08/2023   CL 101 01/08/2023   CO2 25 01/08/2023   BUN 18 01/08/2023   CREATININE 1.35 (H) 01/08/2023   EGFR 54 (L) 01/08/2023   CALCIUM 9.9 01/08/2023   PROT 6.8 01/08/2023   ALBUMIN 4.2 01/08/2023   LABGLOB 2.6 01/08/2023   AGRATIO 1.8 07/23/2022   BILITOT 0.5 01/08/2023   ALKPHOS 96 01/08/2023   AST 19 01/08/2023   ALT 15 01/08/2023   ANIONGAP 11 08/21/2020   Last lipids Lab Results  Component Value Date   CHOL 128 07/23/2022   HDL 41 07/23/2022   LDLCALC 69 07/23/2022   TRIG 96 07/23/2022   CHOLHDL 3.4 05/07/2022   Last hemoglobin A1c Lab Results  Component Value Date   HGBA1C 9.4 (A) 04/15/2023   Last thyroid functions Lab Results  Component Value Date   TSH 2.250 01/08/2023   T3TOTAL 98 03/04/2021   Last vitamin D Lab Results  Component Value Date   VD25OH 35.4 03/17/2023   Last vitamin B12 and Folate Lab Results  Component Value Date   VITAMINB12 422 07/23/2022   FOLATE 16.9 05/25/2016     Assessment & Plan:   Problem List Items Addressed This Visit       Essential hypertension    Adequately controlled on  current antihypertensive regimen.  No medication  changes are indicated today.      CORONARY ATHEROSCLEROSIS NATIVE CORONARY ARTERY    Recently underwent CT coronary calcium with score 643.  ASA and statin therapy recommended.  He is currently prescribed simvastatin 20 mg daily.  I recommended starting ASA 81 mg daily in accordance with cardiology recommendations      Uncontrolled type 2 diabetes mellitus with hyperglycemia (HCC)    Followed by endocrinology.  A1c has increased to 9.4.  He continues to focus on lifestyle changes aimed at weight loss and improving his blood sugar.  Endocrinology follow-up is scheduled for 1/21.      Onychomycosis - Primary    Noted on multiple fingernails of the right hand.  Terbinafine 250 mg daily x 6 weeks prescribed today.      Chronic kidney disease, stage 3a (HCC)    Renal function stable on latest labs, remains consistent with CKD 3A.  Currently on ARB.  Consider SGLT2i in the setting of diabetes mellitus.      Class 2 severe obesity with serious comorbidity and body mass index (BMI) of 36.0 to 36.9 in adult [facility])    His weight today is 250 pounds.  He is closely followed by healthy weight and wellness.  He has lost 6 pounds since joining the program.  He continues to focus on making lifestyle changes aimed at weight loss.      Vitamin D deficiency    Vitamin D level within normal limits when updated in September.  He remains on weekly vitamin D supplementation.      Elevated PSA    Followed by urology (Dr. Annabell Howells).  He will return to care for follow-up and repeat PSA later this month.  Biopsy to be considered pending results.      Need for influenza vaccination    Influenza vaccine administered today      Return in about 3 months (around 08/07/2023).   Billie Lade, MD

## 2023-05-07 NOTE — Assessment & Plan Note (Signed)
His weight today is 250 pounds.  He is closely followed by healthy weight and wellness.  He has lost 6 pounds since joining the program.  He continues to focus on making lifestyle changes aimed at weight loss.

## 2023-05-07 NOTE — Assessment & Plan Note (Signed)
Noted on multiple fingernails of the right hand.  Terbinafine 250 mg daily x 6 weeks prescribed today.

## 2023-05-07 NOTE — Assessment & Plan Note (Signed)
Followed by endocrinology.  A1c has increased to 9.4.  He continues to focus on lifestyle changes aimed at weight loss and improving his blood sugar.  Endocrinology follow-up is scheduled for 1/21.

## 2023-05-07 NOTE — Assessment & Plan Note (Addendum)
Renal function stable on latest labs, remains consistent with CKD 3A.  Currently on ARB.  Consider SGLT2i in the setting of diabetes mellitus.

## 2023-05-12 ENCOUNTER — Encounter (INDEPENDENT_AMBULATORY_CARE_PROVIDER_SITE_OTHER): Payer: Self-pay | Admitting: Family Medicine

## 2023-05-12 ENCOUNTER — Ambulatory Visit (INDEPENDENT_AMBULATORY_CARE_PROVIDER_SITE_OTHER): Payer: Medicare HMO | Admitting: Family Medicine

## 2023-05-12 VITALS — BP 146/67 | HR 62 | Temp 97.6°F | Ht 68.0 in | Wt 244.0 lb

## 2023-05-12 DIAGNOSIS — E559 Vitamin D deficiency, unspecified: Secondary | ICD-10-CM

## 2023-05-12 DIAGNOSIS — M79671 Pain in right foot: Secondary | ICD-10-CM

## 2023-05-12 DIAGNOSIS — E1159 Type 2 diabetes mellitus with other circulatory complications: Secondary | ICD-10-CM | POA: Diagnosis not present

## 2023-05-12 DIAGNOSIS — I152 Hypertension secondary to endocrine disorders: Secondary | ICD-10-CM | POA: Diagnosis not present

## 2023-05-12 DIAGNOSIS — Z794 Long term (current) use of insulin: Secondary | ICD-10-CM

## 2023-05-12 DIAGNOSIS — Z6837 Body mass index (BMI) 37.0-37.9, adult: Secondary | ICD-10-CM

## 2023-05-12 DIAGNOSIS — E1165 Type 2 diabetes mellitus with hyperglycemia: Secondary | ICD-10-CM | POA: Diagnosis not present

## 2023-05-12 DIAGNOSIS — Z7984 Long term (current) use of oral hypoglycemic drugs: Secondary | ICD-10-CM

## 2023-05-12 MED ORDER — VITAMIN D (ERGOCALCIFEROL) 1.25 MG (50000 UNIT) PO CAPS: 50000.0000 [IU] | ORAL_CAPSULE | ORAL | 0 refills | Status: DC

## 2023-05-12 NOTE — Progress Notes (Signed)
Gerald Evans, D.O.  ABFM, ABOM Specializing in Clinical Bariatric Medicine  Office located at: [address] [place], [place]  [address]   Assessment and Plan:  Review labs with pt next OV FOR THE DISEASE OF OBESITY: BMI 37.0-37.9, adult - current BMI 37.11 Obesity with starting BMI of 36.8 Since last office visit on 04/14/2023 patient's  Muscle mass has decreased by 2.4lb. Fat mass has decreased by 1.2lb. Total body water has decreased by 0.6lb.  Counseling done on how various foods will affect these numbers and how to maximize success  Total lbs lost to date: +2 Total weight loss percentage to date: +0.83%   Recommended Dietary Goals Gerald Evans is currently in the action stage of change. As such, his goal is to continue weight management plan.  He has agreed to: continue current plan   Behavioral Intervention We discussed the following Behavioral Modification Strategies today: decreasing simple carbohydrates , increasing lower glycemic fruits, and continue to work on maintaining a reduced calorie state, getting the recommended amount of protein, incorporating whole foods, making healthy choices, staying well hydrated and practicing mindfulness when eating..  Additional resources provided today:  plantar fasciitis information and stretches   Evidence-based interventions for health behavior change were utilized today including the discussion of self monitoring techniques, problem-solving barriers and SMART goal setting techniques.   Regarding patient's less desirable eating habits and patterns, we employed the technique of small changes.   Pt will specifically work on: Following the meal plan for next visit.   Recommended Physical Activity Goals Gerald Evans has been advised to work up to 150 minutes of moderate intensity aerobic activity a week and strengthening exercises 2-3 times per week for cardiovascular health, weight loss maintenance and preservation of muscle mass.    He has agreed to :  Unable to participate in physical activity at present due to medical conditions    Pharmacotherapy We discussed various medication options to help [facility] with his weight loss efforts and we both agreed to : continue with nutritional and behavioral strategies and continue current anti-obesity medication regimen  FOR ASSOCIATED CONDITIONS ADDRESSED TODAY: Essential hypertension Assessment: Condition is Not optimized.. Pt endorses cutting out his salt intake and decreasing his sugar intake significantly. His BP today is elevated at 146/67. This is being treated with Cozaar, lopressor, hydralazine, and hygroton. Pt endorses not taking hygroton. He notes being on Norvasc many years ago.  Last 3 blood pressure readings in our office are as follows: BP Readings from Last 3 Encounters:  05/12/23 (!) 146/67  05/07/23 (!) 123/54  04/15/23 138/80   Plan: - Continue with Cozaar, lopressor, and hydralazine as directed. I informed pt to contact his PCP and discuss whether he is supposed to be taking hygroton daily.   - Lifestyle changes such as following our low salt, heart healthy meal plan and engaging in a regular exercise program discussed   - Ambulatory blood pressure monitoring encouraged.  Reminded patient that if they ever feel poorly in any way, to check their blood pressure and pulse as well.  Hypertension associated with type 2 diabetes mellitus (HCC) -     Comprehensive metabolic panel   Type 2 diabetes mellitus with hyperglycemia, with long-term current use of insulin (HCC) Assessment: Condition is Not optimized.. This is being controlled with Novolog and Metformin. Pt tolerates this without difficulty and denies any GI upset, N/V/D, or any other adverse side effects.  Lab Results  Component Value Date   HGBA1C 9.4 (A)  04/15/2023   HGBA1C 8.2 (A) 01/13/2023   HGBA1C 7.8 (H) 07/23/2022   INSULIN 25.6 (H) 07/23/2022    Plan: -  Continue with Novolog 40 units  BID and Metformin 1000 mg once daily. If he can't get better control we will consider a GLP-1 in the future. (Preferably year 2025)  - Intensive lifestyle modification including diet, exercise and weight loss are the first line of treatment for diabetes. We extensively discussed the importance of decreasing simple carbs, increasing proteins and how certain foods they eat will affect their blood sugars  - Reminded Gerald Evans if he feels poorly- check Blood Sugar and Blood Pressure at that time.     - Separate review and interpretation of labs done on 04/15/2023 was preformed A1c worse now at 9.4 up from 8.2. One year ago his albumin creatinine ratio was <30 to now 30-300.   - Because of worsening A1c and kidney function tests I will recheck his level today. - Dr. Durwin Nora recently ordered a full set of labs.   - Recommend that any concerns about medicines should be directed at the prescribing provider  - Importance of f/up with PCP and all other specialists, as scheduled, was stressed to the patient today    Vitamin D deficiency Assessment: Condition is Not optimized.. Patient reports good compliance and tolerance of taking Ergocalciferol now unlike last OV. He is tolerating this well and denies any adverse side effects.  Lab Results  Component Value Date   VD25OH 35.4 03/17/2023   VD25OH 48.0 07/23/2022   VD25OH 45.3 12/01/2021   Plan: - Continue Ergocalciferol 50K IU weekly  and he was encouraged to continue to take the medicine until told otherwise.     - We will need to monitor levels regularly to keep levels within normal limits and prevent over supplementation. I will recheck today.   Vitamin D deficiency -     Vitamin D (Ergocalciferol); Take 1 capsule (50,000 Units total) by mouth every 7 (seven) days.  Dispense: 4 capsule; Refill: 0 -     VITAMIN D 25 Hydroxy (Vit-D Deficiency, Fractures)   Inflammatory heel pain, right Assessment: Condition is Worsening.. Pt informed me  for the last 2 weeks his right heel has been aching. He went to see Dr. Durwin Nora last week on 11/08 and discussed this pain. He was advised that if this persists to follow-up with him.    Plan: - Follow-up with PCP as advised.   - Information and handouts on plantar fasciitis was given.   - pt's questions and concerns regarding this condition addressed. Perform exercises/stretches on his right foot; such as a towel stretch for 10 stretches 1 minute each 3x a day and ice bottle massage on the bottom of his right foot by rolling his foot over a bottle of ice for 15 minutes after each stretch.  - Look into [employer].     FOLLOW UP: Return in about 4 weeks (around 06/09/2023). He was informed of the importance of frequent follow up visits to maximize his success with intensive lifestyle modifications for his multiple health conditions. Gerald Evans is aware that we will review all of his lab results at our next visit together in person.  He is aware that if anything is critical/ life threatening with the results, we will be contacting him via MyChart or by my CMA will be calling them prior to the office visit to discuss acute management.    Subjective:   Chief  complaint: Obesity Tyjai is here to discuss his progress with his obesity treatment plan. He is on the the Category 2 Plan with 100 snack calories and states he is following his eating plan approximately 60% of the time. He states he is going to the gym 45 minutes 2 days per week.  Interval History:  KANYON GRUN is here for a follow up office visit. Since last OV,  he has been ok. He informed me that for the past 2 weeks the back of his right heel has been aching. He only notes decreasing his sugar and salt intake. He does not note any other dietary changes.    Barriers identified: orthopedic problems, medical conditions or chronic pain affecting mobility.   Pharmacotherapy for weight loss: He is currently taking Metformin  (off label use for incretin effect and / or insulin resistance and / or diabetes prevention) with adequate clinical response  and without side effects..  Review of Systems:  Pertinent positives were addressed with patient today.  Reviewed by clinician on day of visit: allergies, medications, problem list, medical history, surgical history, family history, social history, and previous encounter notes.  Weight Summary and Biometrics   Weight Lost Since Last Visit: 4lb  Weight Gained Since Last Visit: 0lb  Vitals Temp: 97.6 F (36.4 C) BP: (!) 146/67 Pulse Rate: 62 SpO2: 99 %   Anthropometric Measurements Height: 5\' 8"  (1.727 m) Weight: 244 lb (110.7 kg) BMI (Calculated): 37.11 Weight at Last Visit: 248lb Weight Lost Since Last Visit: 4lb Weight Gained Since Last Visit: 0lb Starting Weight: 242lb Total Weight Loss (lbs): 0 lb (0 kg) Peak Weight: 257lb   Body Composition  Body Fat %: 36.5 % Fat Mass (lbs): 89.2 lbs Muscle Mass (lbs): 147.8 lbs Total Body Water (lbs): 113.6 lbs Visceral Fat Rating : 25   Other Clinical Data Fasting: no Labs: no Today's Visit #: 27 Starting Date: 03/04/21    Objective:   PHYSICAL EXAM: Blood pressure (!) 146/67, pulse 62, temperature 97.6 F (36.4 C), height 5\' 8"  (1.727 m), weight 244 lb (110.7 kg), SpO2 99%. Body mass index is 37.1 kg/m.  General: he is overweight, cooperative and in no acute distress. PSYCH: Has normal mood, affect and thought process.   HEENT: EOMI, sclerae are anicteric. Lungs: Normal breathing effort, no conversational dyspnea. Extremities: Moves * 4 Neurologic: A and O * 3, good insight  DIAGNOSTIC DATA REVIEWED: BMET    Component Value Date/Time   NA 139 01/08/2023 1139   K 4.2 01/08/2023 1139   CL 101 01/08/2023 1139   CO2 25 01/08/2023 1139   GLUCOSE 141 (H) 01/08/2023 1139   GLUCOSE 234 (H) 08/21/2020 1437   BUN 18 01/08/2023 1139   CREATININE 1.35 (H) 01/08/2023 1139   CREATININE 1.00  03/18/2020 1321   CALCIUM 9.9 01/08/2023 1139   GFRNONAA >60 08/21/2020 1437   GFRNONAA 74 03/18/2020 1321   GFRAA 67 07/17/2020 0953   GFRAA 86 03/18/2020 1321   Lab Results  Component Value Date   HGBA1C 9.4 (A) 04/15/2023   HGBA1C 8.5 10/10/2019   Lab Results  Component Value Date   INSULIN 25.6 (H) 07/23/2022   Lab Results  Component Value Date   TSH 2.250 01/08/2023   CBC    Component Value Date/Time   WBC 7.5 07/23/2022 0921   WBC 13.6 (H) 08/21/2020 1437   RBC 5.26 07/23/2022 0921   RBC 4.85 08/21/2020 1437   HGB 12.5 (L) 07/23/2022 0921   HCT  39.0 07/23/2022 0921   PLT 218 07/23/2022 0921   MCV 74 (L) 07/23/2022 0921   MCH 23.8 (L) 07/23/2022 0921   MCH 24.3 (L) 08/21/2020 1437   MCHC 32.1 07/23/2022 0921   MCHC 33.1 08/21/2020 1437   RDW 18.3 (H) 07/23/2022 0921   Iron Studies    Component Value Date/Time   IRON 54 07/23/2022 0921   TIBC 285 07/23/2022 0921   FERRITIN 124 07/23/2022 0921   IRONPCTSAT 19 07/23/2022 0921   IRONPCTSAT 24 05/25/2016 0814   Lipid Panel     Component Value Date/Time   CHOL 128 07/23/2022 0921   CHOL 163 05/11/2022 1217   TRIG 96 07/23/2022 0921   TRIG 125 05/11/2022 1217   HDL 41 07/23/2022 0921   CHOLHDL 3.4 05/07/2022 1053   CHOLHDL 3.1 03/18/2020 1321   LDLCALC 69 07/23/2022 0921   LDLCALC 74 03/18/2020 1321   Hepatic Function Panel     Component Value Date/Time   PROT 6.8 01/08/2023 1139   ALBUMIN 4.2 01/08/2023 1139   AST 19 01/08/2023 1139   ALT 15 01/08/2023 1139   ALKPHOS 96 01/08/2023 1139   BILITOT 0.5 01/08/2023 1139   BILIDIR 0.1 05/14/2017 1333   IBILI 0.2 05/14/2017 1333      Component Value Date/Time   TSH 2.250 01/08/2023 1139   Nutritional Lab Results  Component Value Date   VD25OH 35.4 03/17/2023   VD25OH 48.0 07/23/2022   VD25OH 45.3 12/01/2021    Attestations:   I,Jasmine M Lassiter,acting as a Neurosurgeon for [name], DO.,have documented all relevant documentation on the  behalf of [name], DO,as directed by  [name], DO while in the presence of [employer], DO.  Reviewed by clinician on day of visit: allergies, medications, problem list, medical history, surgical history, family history, social history, and previous encounter notes pertinent to patient's obesity diagnosis. preparing to see patient (e.g. review and interpretation of tests, old notes ), obtaining and/or reviewing separately obtained history, performing a medically appropriate examination or evaluation, counseling and educating the patient, ordering medications, test or procedures, documenting clinical information in the electronic or other health care record, and independently interpreting results and communicating results to the patient, family, or caregiver   I have reviewed the above documentation for accuracy and completeness, and I agree with the above. Gerald Evans, D.O.  The 21st Century Cures Act was signed into law in 2016 which includes the topic of electronic health records.  This provides immediate access to information in MyChart.  This includes consultation notes, operative notes, office notes, lab results and pathology reports.  If you have any questions about what you read please let us know at your next visit so we can discuss your concerns and take corrective action if need be.  We are right here with you.

## 2023-05-13 ENCOUNTER — Other Ambulatory Visit: Payer: Medicare HMO

## 2023-05-13 LAB — COMPREHENSIVE METABOLIC PANEL
ALT: 12 [IU]/L (ref 0–44)
AST: 18 [IU]/L (ref 0–40)
Albumin: 4.2 g/dL (ref 3.8–4.8)
Alkaline Phosphatase: 77 [IU]/L (ref 44–121)
BUN/Creatinine Ratio: 16 (ref 10–24)
BUN: 21 mg/dL (ref 8–27)
Bilirubin Total: 0.3 mg/dL (ref 0.0–1.2)
CO2: 20 mmol/L (ref 20–29)
Calcium: 9.6 mg/dL (ref 8.6–10.2)
Chloride: 107 mmol/L — ABNORMAL HIGH (ref 96–106)
Creatinine, Ser: 1.3 mg/dL — ABNORMAL HIGH (ref 0.76–1.27)
Globulin, Total: 2.4 g/dL (ref 1.5–4.5)
Glucose: 123 mg/dL — ABNORMAL HIGH (ref 70–99)
Potassium: 4.2 mmol/L (ref 3.5–5.2)
Sodium: 142 mmol/L (ref 134–144)
Total Protein: 6.6 g/dL (ref 6.0–8.5)
eGFR: 57 mL/min/{1.73_m2} — ABNORMAL LOW (ref >59)

## 2023-05-13 LAB — VITAMIN D 25 HYDROXY (VIT D DEFICIENCY, FRACTURES): Vit D, 25-Hydroxy: 59 ng/mL (ref 30.0–100.0)

## 2023-05-14 ENCOUNTER — Other Ambulatory Visit: Payer: Medicare HMO

## 2023-05-14 LAB — PSA, TOTAL AND FREE
PSA, Free Pct: 18.4 %
PSA, Free: 1.03 ng/mL
Prostate Specific Ag, Serum: 5.6 ng/mL — ABNORMAL HIGH (ref 0.0–4.0)

## 2023-05-20 ENCOUNTER — Other Ambulatory Visit: Payer: Self-pay | Admitting: Internal Medicine

## 2023-05-20 ENCOUNTER — Ambulatory Visit: Payer: Medicare HMO | Admitting: Urology

## 2023-05-20 VITALS — BP 161/70 | HR 51 | Ht 68.0 in | Wt 244.0 lb

## 2023-05-20 DIAGNOSIS — N5201 Erectile dysfunction due to arterial insufficiency: Secondary | ICD-10-CM

## 2023-05-20 DIAGNOSIS — N138 Other obstructive and reflux uropathy: Secondary | ICD-10-CM

## 2023-05-20 DIAGNOSIS — R972 Elevated prostate specific antigen [PSA]: Secondary | ICD-10-CM

## 2023-05-20 DIAGNOSIS — N401 Enlarged prostate with lower urinary tract symptoms: Secondary | ICD-10-CM | POA: Diagnosis not present

## 2023-05-20 DIAGNOSIS — Z87440 Personal history of urinary (tract) infections: Secondary | ICD-10-CM

## 2023-05-20 DIAGNOSIS — R351 Nocturia: Secondary | ICD-10-CM

## 2023-05-20 DIAGNOSIS — R931 Abnormal findings on diagnostic imaging of heart and coronary circulation: Secondary | ICD-10-CM

## 2023-05-20 NOTE — Progress Notes (Signed)
Subjective: 1. Elevated PSA   2. BPH with urinary obstruction   3. 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 Personal history of urinary infection       05/20/23: Mr. Mcquade returns today in f/u.   His PSA is back down to 5.6 with an 18.4 % f/t ratio.  He is voiding well with an IPSS of 1 and nocturia x 1.  He has had no hematuria or dysuria.   He remains on sildenafil and tadalafil with success.   02/18/23: Mr. Bocook returns today in f/u.  His PSA is up to 6.2 with a 17.3% f/t ration.  He remains on sildenafil for the ED with success.  He continues to void well with nocturia x 1.  He has had no UTI symptoms. HIs UA is clear.  He had COVID a month ago.   08/20/22: Mr. Bolosan returns today in f/u.  His PSA is stable at 5 with an 18.8% f/t ratio.  He remains on sildenafil for ED with some decline in function. He continues to void well with an IPSS of 2 and nocturia x 1.  He is off of the furosemide.  He has no symptoms to suggest a recurrent UTI.  His UA is clear.   01/15/22: Mr. Gaddis returns today in f/u for his elevated PSA of 5.  It is down to 4.9 prior to this visit.   His UA is clear.  His IPSS is 4 with nocturia x 2. He is taking an antiacid with the sildenafil which has eliminated the heartburn.   He has been started on furosemide for ankle edema but isn't having too much frequency with that.  He has had prior UTI's but his UA is clear today.   10/23/21: Mr. Barefield returns today in f/u for his history of an elevated PSA associated with a UTI.   The PSA was back down to 2.6 after treatment but is back up to 5.0 with an 18.4% f/t ratio prior to this visit.  He has had no recurrent UTI's.  His IPSS today is 5 with nocturia x 2.  He has ED and continues to use sildenafil but he gets heartburn with it.  His UA is unremarkable.  He has an issue with the 3rd finger on the right hand to straightening completely.    10/24/20: Mr. Hamson returns today with a repeat PSA which is  down to 2.6 with a 26% f/t ratio after completing his antibiotics.   His UA is clear.     GU Hx: Mr. Drace is a 77 yo male who is sent by Dr. Felecia Shelling for the evaluation of an elevated PSA of 36 on 08/23/20.  He thinks his PSA was about 3.1 in 2021.  He had been seen in the ER on 2/23 and was found to have a e. Coli UTI.   He was treated with a 7 day course of keflex.  A CT showed changes consistent with cystitis.  He had previously seen Dr. Retta Diones for ED. His UA is clear today.  He has a little residual initial dysuria.  He has moderate LUTS with urgency and occasional UUI. He doesn't always feel empty.  He has a good stream.  He has nocturia x 2.   He has had no prior UTI's or GU surgery.   He is a diabetic and has HTN.  ROS:  Review of Systems  Cardiovascular:  Positive for leg swelling.  All other  systems reviewed and are negative.   Allergies  Allergen Reactions   Lisinopril     cough    Past Medical History:  Diagnosis Date   Colon polyps    Diabetes mellitus without complication (HCC)    Edema of both lower extremities    Fatty liver    Hypercholesteremia    Hypertension     Past Surgical History:  Procedure Laterality Date   COLONOSCOPY     COLONOSCOPY  04/21/2012   Procedure: COLONOSCOPY;  Surgeon: Malissa Hippo, MD;  Location: [facility];  Service: Endoscopy;  Laterality: N/A;  1200   COLONOSCOPY N/A 09/09/2017   Procedure: COLONOSCOPY;  Surgeon: Malissa Hippo, MD;  Location: [facility];  Service: Endoscopy;  Laterality: N/A;  1200-rescheduled to 3/14/ @12 :00pm per Dewayne Hatch   HERNIA REPAIR     POLYPECTOMY  09/09/2017   Procedure: POLYPECTOMY;  Surgeon: Malissa Hippo, MD;  Location: [facility];  Service: Endoscopy;;  Transverse Colon (CB)    Social History   Socioeconomic History   Marital status: Married    Spouse name: Not on file   Number of children: 2   Years of education: Not on file   Highest education level: Not on file  Occupational History    Occupation: retired  Tobacco Use   Smoking status: Former    Current packs/day: 0.50    Average packs/day: 0.5 packs/day for 20.0 years (10.0 ttl pk-yrs)    Types: Cigarettes   Smokeless tobacco: Never  Vaping Use   Vaping status: Never Used  Substance and Sexual Activity   Alcohol use: Yes    Alcohol/week: 4.0 standard drinks of alcohol    Types: 4 Standard drinks or equivalent per week   Drug use: No   Sexual activity: Not on file  Other Topics Concern   Not on file  Social History Narrative   Not on file   Social Determinants of Health   Financial Resource Strain: Low Risk  (11/18/2022)   Overall Financial Resource Strain (CARDIA)    Difficulty of Paying Living Expenses: Not hard at all  Food Insecurity: No Food Insecurity (11/18/2022)   Hunger Vital Sign    Worried About Running Out of Food in the Last Year: Never true    Ran Out of Food in the Last Year: Never true  Transportation Needs: No Transportation Needs (11/18/2022)   PRAPARE - Administrator, Civil Service (Medical): No    Lack of Transportation (Non-Medical): No  Physical Activity: Sufficiently Active (11/18/2022)   Exercise Vital Sign    Days of Exercise per Week: 7 days    Minutes of Exercise per Session: 30 min  Stress: No Stress Concern Present (11/18/2022)   [employer] of Occupational Health - Occupational Stress Questionnaire    Feeling of Stress : Not at all  Social Connections: Socially Integrated (11/18/2022)   Social Connection and Isolation Panel [NHANES]    Frequency of Communication with Friends and Family: More than three times a week    Frequency of Social Gatherings with Friends and Family: More than three times a week    Attends Religious Services: More than 4 times per year    Active Member of [employer] or Organizations: Yes    Attends Banker Meetings: More than 4 times per year    Marital Status: Married  Catering manager Violence: Not At Risk (11/18/2022)    Humiliation, Afraid, Rape, and Kick questionnaire    Fear  of Current or Ex-Partner: No    Emotionally Abused: No    Physically Abused: No    Sexually Abused: No    Family History  Problem Relation Age of Onset   Hypertension Mother    Hypertension Father    Alcoholism Father    Obesity Father    Diabetes Sister    Hypertension Sister    Healthy Daughter    Healthy Son    Colon cancer Neg Hx     Anti-infectives: Anti-infectives (From admission, onward)    None       Current Outpatient Medications  Medication Sig Dispense Refill   blood glucose meter kit and supplies Dispense based on patient and insurance preference. Use up to four times daily as directed. (FOR ICD-10 E11.65) 1 each 0   chlorthalidone (HYGROTON) 25 MG tablet Take 1 tablet (25 mg total) by mouth daily. 30 tablet 2   glucose blood (ONETOUCH ULTRA) test strip USE 1 STRIP TO CHECK GLUCOSE THREE TIMES DAILY AS DIRECTED E11.65 300 each 1   hydrALAZINE (APRESOLINE) 50 MG tablet TAKE 1 TABLET BY MOUTH THREE TIMES DAILY 90 tablet 0   insulin aspart protamine - aspart (NOVOLOG 70/30 FLEXPEN) (70-30) 100 UNIT/ML FlexPen Inject 40 Units into the skin 2 (two) times daily with a meal. 75 mL 3   Insulin Pen Needle (BD PEN NEEDLE NANO 2ND GEN) 32G X 4 MM MISC 1 Package by Does not apply route 2 (two) times daily. 100 each 0   levothyroxine (SYNTHROID) 75 MCG tablet Take 1 tablet (75 mcg total) by mouth daily before breakfast. 90 tablet 1   losartan (COZAAR) 100 MG tablet Take 100 mg by mouth daily.     metFORMIN (GLUCOPHAGE) 1000 MG tablet Take 1 tablet (1,000 mg total) by mouth daily with breakfast. 90 tablet 3   metoprolol (LOPRESSOR) 100 MG tablet Take 100 mg by mouth 2 (two) times daily.      OneTouch Delica Lancets 30G MISC 1 each by Does not apply route 3 (three) times daily. Use to check blood glucose three times daily 100 each 3   sildenafil (VIAGRA) 100 MG tablet Take 1/2 tab in combination with 10mg  of tadalafil. 30  tablet 11   simvastatin (ZOCOR) 20 MG tablet Take 1 tablet (20 mg total) by mouth daily. 30 tablet 3   tadalafil (CIALIS) 20 MG tablet Use either 1 tab po daily prn or 1/2 tab po with 1/2 tab sildenafil 100mg  po daily prn 6 tablet 11   terbinafine (LAMISIL) 250 MG tablet Take 1 tablet (250 mg total) by mouth daily. 42 tablet 0   Vitamin D, Ergocalciferol, (DRISDOL) 1.25 MG (50000 UNIT) CAPS capsule Take 1 capsule (50,000 Units total) by mouth every 7 (seven) days. 4 capsule 0   No current facility-administered medications for this visit.     Objective: Vital signs in last 24 hours: BP (!) 161/70   Pulse (!) 51   Ht 5\' 8"  (1.727 m)   Wt 244 lb (110.7 kg)   BMI 37.10 kg/m   Intake/Output from previous day: No intake/output data recorded. Intake/Output this shift: @IOTHISSHIFT @   Physical Exam Vitals reviewed.  Constitutional:      Appearance: Normal appearance.  Genitourinary:    Comments: AP NST without mass. Prostate is 2+ benign. SV non-palpable.  Neurological:     Mental Status: He is alert.     Lab Results:  Recent Results (from the past 2160 hour(s))  VITAMIN D 25 Hydroxy (Vit-D Deficiency,  Fractures)     Status: None   Collection Time: 03/17/23  1:01 PM  Result Value Ref Range   Vit D, 25-Hydroxy 35.4 30.0 - 100.0 ng/mL    Comment: Vitamin D deficiency has been defined by the Institute of Medicine and an Endocrine Society practice guideline as a level of serum 25-OH vitamin D less than 20 ng/mL (1,2). The Endocrine Society went on to further define vitamin D insufficiency as a level between 21 and 29 ng/mL (2). 1. IOM (Institute of Medicine). 2010. Dietary reference    intakes for calcium and D. [place]: The    [employer]. 2. Holick MF, Binkley [place], Bischoff-Ferrari HA, et al.    Evaluation, treatment, and prevention of vitamin D    deficiency: an Endocrine Society clinical practice    guideline. JCEM. 2011 Jul; 96(7):1911-30.   POCT UA  - Microalbumin     Status: Abnormal   Collection Time: 04/15/23 11:23 AM  Result Value Ref Range   Microalbumin Ur, POC 80mg /L mg/L   Creatinine, POC 200mg /dL mg/dL   Albumin/Creatinine Ratio, Urine, POC 30-300 mg/G   HgB A1c     Status: Abnormal   Collection Time: 04/15/23 11:24 AM  Result Value Ref Range   Hemoglobin A1C 9.4 (A) 4.0 - 5.6 %   HbA1c POC (<> result, manual entry)     HbA1c, POC (prediabetic range)     HbA1c, POC (controlled diabetic range)    VITAMIN D 25 Hydroxy (Vit-D Deficiency, Fractures)     Status: None   Collection Time: 05/12/23 11:33 AM  Result Value Ref Range   Vit D, 25-Hydroxy 59.0 30.0 - 100.0 ng/mL    Comment: Vitamin D deficiency has been defined by the Institute of Medicine and an Endocrine Society practice guideline as a level of serum 25-OH vitamin D less than 20 ng/mL (1,2). The Endocrine Society went on to further define vitamin D insufficiency as a level between 21 and 29 ng/mL (2). 1. IOM (Institute of Medicine). 2010. Dietary reference    intakes for calcium and D. [place]: The    [employer]. 2. Holick MF, Binkley [place], Bischoff-Ferrari HA, et al.    Evaluation, treatment, and prevention of vitamin D    deficiency: an Endocrine Society clinical practice    guideline. JCEM. 2011 Jul; 96(7):1911-30.   Comprehensive metabolic panel     Status: Abnormal   Collection Time: 05/12/23 11:33 AM  Result Value Ref Range   Glucose 123 (H) 70 - 99 mg/dL   BUN 21 8 - 27 mg/dL   Creatinine, Ser 4.09 (H) 0.76 - 1.27 mg/dL   eGFR 57 (L) >81 XB/JYN/8.29   BUN/Creatinine Ratio 16 10 - 24   Sodium 142 134 - 144 mmol/L   Potassium 4.2 3.5 - 5.2 mmol/L   Chloride 107 (H) 96 - 106 mmol/L   CO2 20 20 - 29 mmol/L   Calcium 9.6 8.6 - 10.2 mg/dL   Total Protein 6.6 6.0 - 8.5 g/dL   Albumin 4.2 3.8 - 4.8 g/dL   Globulin, Total 2.4 1.5 - 4.5 g/dL   Bilirubin Total 0.3 0.0 - 1.2 mg/dL   Alkaline Phosphatase 77 44 - 121 IU/L   AST 18 0 - 40  IU/L   ALT 12 0 - 44 IU/L  PSA, total and free     Status: Abnormal   Collection Time: 05/13/23 11:14 AM  Result Value Ref Range   Prostate Specific Ag, Serum 5.6 (H) 0.0 -  4.0 ng/mL    Comment: Roche ECLIA methodology. According to the American Urological Association, Serum PSA should decrease and remain at undetectable levels after radical prostatectomy. The AUA defines biochemical recurrence as an initial PSA value 0.2 ng/mL or greater followed by a subsequent confirmatory PSA value 0.2 ng/mL or greater. Values obtained with different assay methods or kits cannot be used interchangeably. Results cannot be interpreted as absolute evidence of the presence or absence of malignant disease.    PSA, Free 1.03 N/A ng/mL    Comment: Roche ECLIA methodology.   PSA, Free Pct 18.4 %    Comment: The table below lists the probability of prostate cancer for men with non-suspicious DRE results and total PSA between 4 and 10 ng/mL, by patient age Damaris Schooner, JAMA 1998, 962:9528).                   % Free PSA       50-64 yr        65-75 yr                   0.00-10.00%        56%             55%                  10.01-15.00%        24%             35%                  15.01-20.00%        17%             23%                  20.01-25.00%        10%             20%                       >25.00%         5%              9% Please note:  Catalona et al did not make specific               recommendations regarding the use of               percent free PSA for any other population               of men.    UA is clear.  Studies/Results: No results found.   Assessment/Plan: Elevated PSA.  His PSA is back down some so I think I will just recheck it in 6 months.  BPH with BOO.  He only has mild LUTS.   History of UTI/Prostatitis.  He will get a UA today.   ED.  He is responding tp the combination of tadalafil 20mg   1/2 and sildenafil 100mg  1/2 as needed.     No orders of the defined  types were placed in this encounter.    Orders Placed This Encounter  Procedures   PSA, total and free    Standing Status:   Future    Standing Expiration Date:   05/19/2024     Return in about 6 months (around 11/17/2023).    CC: Dr. Avon Gully.      Bjorn Pippin 05/21/2023 [phone] ID: [name],  male   DOB: Jul 08, 1945, 76 y.o.   MRN: [id number]

## 2023-05-29 ENCOUNTER — Other Ambulatory Visit: Payer: Self-pay | Admitting: Internal Medicine

## 2023-05-29 DIAGNOSIS — I10 Essential (primary) hypertension: Secondary | ICD-10-CM

## 2023-06-01 ENCOUNTER — Encounter (HOSPITAL_COMMUNITY): Payer: Self-pay

## 2023-06-01 ENCOUNTER — Other Ambulatory Visit (HOSPITAL_COMMUNITY): Payer: Self-pay | Admitting: Emergency Medicine

## 2023-06-01 DIAGNOSIS — R079 Chest pain, unspecified: Secondary | ICD-10-CM

## 2023-06-01 MED ORDER — IVABRADINE HCL 5 MG PO TABS: 5.0000 mg | ORAL_TABLET | Freq: Once | ORAL | 0 refills | Status: AC

## 2023-06-02 ENCOUNTER — Telehealth (HOSPITAL_COMMUNITY): Payer: Self-pay | Admitting: *Deleted

## 2023-06-02 NOTE — Telephone Encounter (Signed)
Reaching out to patient to offer assistance regarding upcoming cardiac imaging study; pt verbalizes understanding of appt date/time, parking situation and where to check in, pre-test NPO status, and verified current allergies; name and call back number provided for further questions should they arise  Larey Brick RN Navigator Cardiac Imaging [facility] [phone] office [phone] cell  Patient reports low HR. He will take his daily metoprolol two hours prior to his cardiac CT scan.

## 2023-06-03 ENCOUNTER — Ambulatory Visit
Admission: RE | Admit: 2023-06-03 | Discharge: 2023-06-03 | Disposition: A | Discharge status: Home / Self Care | Payer: Medicare HMO | Source: Ambulatory Visit | Attending: Internal Medicine | Admitting: Internal Medicine

## 2023-06-03 DIAGNOSIS — I2510 Atherosclerotic heart disease of native coronary artery without angina pectoris: Secondary | ICD-10-CM | POA: Diagnosis not present

## 2023-06-03 DIAGNOSIS — I25119 Atherosclerotic heart disease of native coronary artery with unspecified angina pectoris: Secondary | ICD-10-CM | POA: Insufficient documentation

## 2023-06-03 DIAGNOSIS — R931 Abnormal findings on diagnostic imaging of heart and coronary circulation: Secondary | ICD-10-CM | POA: Diagnosis present

## 2023-06-03 MED ORDER — IOHEXOL 350 MG/ML SOLN
100.0000 mL | Freq: Once | INTRAVENOUS | Status: AC | PRN
  Administered 2023-06-03: 100 mL via INTRAVENOUS

## 2023-06-03 MED ORDER — NITROGLYCERIN 0.4 MG SL SUBL
0.8000 mg | SUBLINGUAL_TABLET | Freq: Once | SUBLINGUAL | Status: AC
  Administered 2023-06-03: 0.8 mg via SUBLINGUAL

## 2023-06-03 MED ORDER — SODIUM CHLORIDE 0.9 % IV BOLUS
125.0000 mL | Freq: Once | INTRAVENOUS | Status: AC
  Administered 2023-06-03: 125 mL via INTRAVENOUS

## 2023-06-03 NOTE — Progress Notes (Signed)
Patient tolerated procedure well. Ambulate w/o difficulty. Denies light headedness or being dizzy. Sitting up drinking water provided. Encouraged to drink extra water today and reasoning explained. Verbalized understanding. All questions answered. ABC intact. No further needs. Discharge from procedure area w/o issues.

## 2023-06-10 ENCOUNTER — Ambulatory Visit (INDEPENDENT_AMBULATORY_CARE_PROVIDER_SITE_OTHER): Payer: Medicare HMO | Admitting: Family Medicine

## 2023-06-10 ENCOUNTER — Encounter (INDEPENDENT_AMBULATORY_CARE_PROVIDER_SITE_OTHER): Payer: Self-pay | Admitting: Family Medicine

## 2023-06-10 VITALS — BP 143/68 | HR 56 | Temp 97.9°F | Ht 68.0 in | Wt 244.0 lb

## 2023-06-10 DIAGNOSIS — Z7984 Long term (current) use of oral hypoglycemic drugs: Secondary | ICD-10-CM

## 2023-06-10 DIAGNOSIS — E559 Vitamin D deficiency, unspecified: Secondary | ICD-10-CM

## 2023-06-10 DIAGNOSIS — E1159 Type 2 diabetes mellitus with other circulatory complications: Secondary | ICD-10-CM

## 2023-06-10 DIAGNOSIS — Z6837 Body mass index (BMI) 37.0-37.9, adult: Secondary | ICD-10-CM

## 2023-06-10 DIAGNOSIS — E1165 Type 2 diabetes mellitus with hyperglycemia: Secondary | ICD-10-CM | POA: Diagnosis not present

## 2023-06-10 DIAGNOSIS — Z794 Long term (current) use of insulin: Secondary | ICD-10-CM | POA: Diagnosis not present

## 2023-06-10 DIAGNOSIS — N1831 Chronic kidney disease, stage 3a: Secondary | ICD-10-CM

## 2023-06-10 DIAGNOSIS — I2510 Atherosclerotic heart disease of native coronary artery without angina pectoris: Secondary | ICD-10-CM

## 2023-06-10 DIAGNOSIS — I152 Hypertension secondary to endocrine disorders: Secondary | ICD-10-CM | POA: Diagnosis not present

## 2023-06-10 MED ORDER — LIRAGLUTIDE 18 MG/3ML ~~LOC~~ SOPN: 0.6000 mg | PEN_INJECTOR | Freq: Every day | SUBCUTANEOUS | 1 refills | Status: DC

## 2023-06-10 MED ORDER — VITAMIN D (ERGOCALCIFEROL) 1.25 MG (50000 UNIT) PO CAPS: 50000.0000 [IU] | ORAL_CAPSULE | ORAL | 1 refills | Status: DC

## 2023-06-10 NOTE — Progress Notes (Incomplete)
[name], D.O.  ABFM, ABOM Specializing in Clinical Bariatric Medicine  Office located at: [address] [place], [place]  [address]   Assessment and Plan:   FOR THE DISEASE OF OBESITY: BMI 37.0-37.9, adult - current BMI 37.11 Obesity with starting BMI of 36.8 Assessment & Plan: Since last office visit on 05/12/23 patient's muscle mass has decreased by 1 lb. Fat mass has increased by 0.8 lb. Total body water has increased by 0.6 lb.  Counseling done on how various foods will affect these numbers and how to maximize success  Total lbs lost to date: + 2 lbs  Total weight loss percentage to date: 0.82%    Recommended Dietary Goals Gavan is currently in the action stage of change. As such, his goal is to continue weight management plan.  He has agreed to: continue current plan   Behavioral Intervention We discussed the following today: continue to work on maintaining a reduced calorie state, getting the recommended amount of protein, incorporating whole foods, making healthy choices, staying well hydrated and practicing mindfulness when eating.  Additional resources provided today: None  Evidence-based interventions for health behavior change were utilized today including the discussion of self monitoring techniques, problem-solving barriers and SMART goal setting techniques.   Regarding patient's less desirable eating habits and patterns, we employed the technique of small changes.   Pt will specifically work on: n/a   Recommended Physical Activity Goals Shneur has been advised to work up to 150 minutes of moderate intensity aerobic activity a week and strengthening exercises 2-3 times per week for cardiovascular health, weight loss maintenance and preservation of muscle mass.   He has agreed to :  increasing walking and learning how to use machines at gym (2 sets of 10 reps)     Pharmacotherapy We both agreed to :  begin Liraglutide  and continue  Metformin.   FOR ASSOCIATED CONDITIONS ADDRESSED TODAY:  Type 2 diabetes mellitus with hyperglycemia, with long-term current use of insulin (HCC) Assessment & Plan: Most recent Hgb A1c and fasting insulin: Lab Results  Component Value Date   HGBA1C 9.4 (A) 04/15/2023   HGBA1C 8.2 (A) 01/13/2023   HGBA1C 7.8 (H) 07/23/2022   INSULIN 25.6 (H) 07/23/2022      Component Value Date/Time   PROT 6.6 05/12/2023 1133   ALBUMIN 4.2 05/12/2023 1133   AST 18 05/12/2023 1133   ALT 12 05/12/2023 1133   ALKPHOS 77 05/12/2023 1133   BILITOT 0.3 05/12/2023 1133   BILIDIR 0.1 05/14/2017 1333   IBILI 0.2 05/14/2017 1333   Followed by endocrinology - LOV was on 04/15/23. He is on Metformin 1,000 mg daily and Insulin 45 units twice daily. Unfortunately, Ozempic not covered by pt's insurance. Hemoglobin A1c is poorly controlled at 9.4. His fasting blood sugars have been in the 140-160 range. Liver enzymes stable.   Patient denies a personal history of pancreatitis;  and denies a family history of medullary thyroid carcinoma or multiple endocrine neoplasia type II.  Potential risks/ anticipated benefits of Liraglutide were explained to patient. Pt is agreeable to starting Liraglutide for better control of A1c and  fasting blood glucose levels. Continue to increase lean protein intake and decrease simple carbs.   Orders: -     Liraglutide; Inject 0.6 mg into the skin daily.  Dispense: 3 mL; Refill: 1   Hypertension associated with type 2 diabetes mellitus (HCC) Assessment & Plan: Last 3 blood pressure readings in our office are as follows:  BP Readings from Last 3 Encounters:  06/10/23 (!) 143/68  06/03/23 (!) 143/77  05/20/23 (!) 161/70   HTN treated with Clorthalidone, Hydralazine, Metoprolol, and Losartan. Pt admits to not taking Clorthalidone for some time because he ran out of his prescription. Blood pressure slightly above target today; pt completely asymptomatic.   Encouraged pt to obtain  all blood pressure medications from a single provider (either PCP or cardiologist). He agrees to contact them for refill on Chorthalidone. Continue with low sodium diet, advance exercise as tolerated.     Chronic kidney disease, stage 3a [facility]) Assessment & Plan: Lab Results  Component Value Date   CREATININE 1.30 (H) 05/12/2023   BUN 21 05/12/2023   NA 142 05/12/2023   K 4.2 05/12/2023   CL 107 (H) 05/12/2023   CO2 20 05/12/2023   Kidney function appears to be stable per most recent labs. Counseled pt on the importance of having well-controlled blood pressure, avoiding nephrotoxic substances, proper hydration, and exercise to improve kidney function. Will continue to monitor.     Atherosclerosis of native coronary artery of native heart without angina pectoris Assessment & Plan: Pt had a coronary calcium CT on 06/03/2023 through cardiology, which showed an elevated coronary calcium score of 643. This is 68th percentile for age and sex. Encouraged pt to contact cardiology and f/up with them to further discuss these results.    Vitamin D deficiency Assessment & Plan: Lab Results  Component Value Date   VD25OH 59.0 05/12/2023   VD25OH 35.4 03/17/2023   VD25OH 48.0 07/23/2022   He is currently on ERGO 50,000 units once a week without any adverse SE. His vitamin D levels are at goal. Continue with weight loss efforts and current supplementation regiment.   Orders: -     Vitamin D (Ergocalciferol); Take 1 capsule (50,000 Units total) by mouth every 7 (seven) days.  Dispense: 4 capsule; Refill: 1   Follow up:   Return 07/15/23. He was informed of the importance of frequent follow up visits to maximize his success with intensive lifestyle modifications for his multiple health conditions.  Subjective:   Chief complaint: Obesity Stylez is here to discuss his progress with his obesity treatment plan. He is on the the Category 2 Plan  with 100 snack calories and states he is following  his eating plan approximately 60% of the time. He states he is exercising 30-50 minutes 3 days per week.  Interval History:  PEARSE KLEIBER is here for a follow up office visit. Since last OV, Mr.Billiot's weight has not changed. Food wise, he endorses that it has been somewhat difficult to get back on plan after the Thanksgiving holidays. However, he does note doing less snacking on unhealthy foods. 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 Drinks 6 bottles of water daily.   Pharmacotherapy for weight loss: He is currently taking  Metformin 1000 mg daily.   Review of Systems:  Pertinent positives were addressed with patient today.  Reviewed by clinician on day of visit: allergies, medications, problem list, medical history, surgical history, family history, social history, and previous encounter notes.  Weight Summary and Biometrics   Weight Lost Since Last Visit: 0  Weight Gained Since Last Visit: 0   Vitals Temp: 97.9 F (36.6 C) BP: (!) 143/68 Pulse Rate: (!) 56 SpO2: 96 %   Anthropometric Measurements Height: 5\' 8"  (1.727 m) Weight: 244 lb (110.7 kg) BMI (Calculated): 37.11 Weight at  Last Visit: 244 lb Weight Lost Since Last Visit: 0 Weight Gained Since Last Visit: 0 Starting Weight: 242 lb Total Weight Loss (lbs): 2 lb (0.907 kg) Peak Weight: 257 lb   Body Composition  Body Fat %: 36.8 % Fat Mass (lbs): 90 lbs Muscle Mass (lbs): 146.8 lbs Total Body Water (lbs): 114.2 lbs Visceral Fat Rating : 25   Other Clinical Data Fasting: yes Labs: no Today's Visit #: 28 Starting Date: 03/04/21   Objective:   PHYSICAL EXAM: Blood pressure (!) 143/68, pulse (!) 56, temperature 97.9 F (36.6 C), height 5\' 8"  (1.727 m), weight 244 lb (110.7 kg), SpO2 96%. Body mass index is 37.1 kg/m.  General: he is overweight, cooperative and in no acute distress. PSYCH: Has normal mood, affect and thought process.   HEENT: EOMI,  sclerae are anicteric. Lungs: Normal breathing effort, no conversational dyspnea. Extremities: Moves * 4 Neurologic: A and O * 3, good insight  DIAGNOSTIC DATA REVIEWED: BMET    Component Value Date/Time   NA 142 05/12/2023 1133   K 4.2 05/12/2023 1133   CL 107 (H) 05/12/2023 1133   CO2 20 05/12/2023 1133   GLUCOSE 123 (H) 05/12/2023 1133   GLUCOSE 234 (H) 08/21/2020 1437   BUN 21 05/12/2023 1133   CREATININE 1.30 (H) 05/12/2023 1133   CREATININE 1.00 03/18/2020 1321   CALCIUM 9.6 05/12/2023 1133   GFRNONAA >60 08/21/2020 1437   GFRNONAA 74 03/18/2020 1321   GFRAA 67 07/17/2020 0953   GFRAA 86 03/18/2020 1321   Lab Results  Component Value Date   HGBA1C 9.4 (A) 04/15/2023   HGBA1C 8.5 10/10/2019   Lab Results  Component Value Date   INSULIN 25.6 (H) 07/23/2022   Lab Results  Component Value Date   TSH 2.250 01/08/2023   CBC    Component Value Date/Time   WBC 7.5 07/23/2022 0921   WBC 13.6 (H) 08/21/2020 1437   RBC 5.26 07/23/2022 0921   RBC 4.85 08/21/2020 1437   HGB 12.5 (L) 07/23/2022 0921   HCT 39.0 07/23/2022 0921   PLT 218 07/23/2022 0921   MCV 74 (L) 07/23/2022 0921   MCH 23.8 (L) 07/23/2022 0921   MCH 24.3 (L) 08/21/2020 1437   MCHC 32.1 07/23/2022 0921   MCHC 33.1 08/21/2020 1437   RDW 18.3 (H) 07/23/2022 0921   Iron Studies    Component Value Date/Time   IRON 54 07/23/2022 0921   TIBC 285 07/23/2022 0921   FERRITIN 124 07/23/2022 0921   IRONPCTSAT 19 07/23/2022 0921   IRONPCTSAT 24 05/25/2016 0814   Lipid Panel     Component Value Date/Time   CHOL 128 07/23/2022 0921   CHOL 163 05/11/2022 1217   TRIG 96 07/23/2022 0921   TRIG 125 05/11/2022 1217   HDL 41 07/23/2022 0921   CHOLHDL 3.4 05/07/2022 1053   CHOLHDL 3.1 03/18/2020 1321   LDLCALC 69 07/23/2022 0921   LDLCALC 74 03/18/2020 1321   Hepatic Function Panel     Component Value Date/Time   PROT 6.6 05/12/2023 1133   ALBUMIN 4.2 05/12/2023 1133   AST 18 05/12/2023 1133   ALT  12 05/12/2023 1133   ALKPHOS 77 05/12/2023 1133   BILITOT 0.3 05/12/2023 1133   BILIDIR 0.1 05/14/2017 1333   IBILI 0.2 05/14/2017 1333      Component Value Date/Time   TSH 2.250 01/08/2023 1139   Nutritional Lab Results  Component Value Date   VD25OH 59.0 05/12/2023   VD25OH 35.4 03/17/2023  VD25OH 48.0 07/23/2022    Attestations:   I, Special Puri, acting as a Stage manager for [employer], DO., have compiled all relevant documentation for today's office visit on behalf of [name], DO, while in the presence of [employer], DO.  Reviewed by clinician on day of visit: allergies, medications, problem list, medical history, surgical history, family history, social history, and previous encounter notes pertinent to patient's obesity diagnosis. I have spent 40 minutes in the care of the patient today including: preparing to see patient (e.g. review and interpretation of tests, old notes ), obtaining and/or reviewing separately obtained history, performing a medically appropriate examination or evaluation, counseling and educating the patient, ordering medications, test or procedures, documenting clinical information in the electronic or other health care record, and independently interpreting results and communicating results to the patient, family, or caregiver   I have reviewed the above documentation for accuracy and completeness, and I agree with the above. [name], D.O.  The 21st Century Cures Act was signed into law in 2016 which includes the topic of electronic health records.  This provides immediate access to information in MyChart.  This includes consultation notes, operative notes, office notes, lab results and pathology reports.  If you have any questions about what you read please let us know at your next visit so we can discuss your concerns and take corrective action if need be.  We are right here with you.

## 2023-06-26 ENCOUNTER — Other Ambulatory Visit: Payer: Self-pay | Admitting: Internal Medicine

## 2023-06-26 DIAGNOSIS — B351 Tinea unguium: Secondary | ICD-10-CM

## 2023-06-27 ENCOUNTER — Other Ambulatory Visit: Payer: Self-pay | Admitting: Internal Medicine

## 2023-06-27 DIAGNOSIS — I10 Essential (primary) hypertension: Secondary | ICD-10-CM

## 2023-07-15 ENCOUNTER — Ambulatory Visit (INDEPENDENT_AMBULATORY_CARE_PROVIDER_SITE_OTHER): Payer: Medicare HMO | Admitting: Family Medicine

## 2023-07-15 ENCOUNTER — Encounter (INDEPENDENT_AMBULATORY_CARE_PROVIDER_SITE_OTHER): Payer: Self-pay | Admitting: Family Medicine

## 2023-07-15 VITALS — BP 123/65 | HR 51 | Temp 97.6°F | Ht 68.0 in | Wt 242.0 lb

## 2023-07-15 DIAGNOSIS — E669 Obesity, unspecified: Secondary | ICD-10-CM

## 2023-07-15 DIAGNOSIS — E1165 Type 2 diabetes mellitus with hyperglycemia: Secondary | ICD-10-CM | POA: Diagnosis not present

## 2023-07-15 DIAGNOSIS — E1159 Type 2 diabetes mellitus with other circulatory complications: Secondary | ICD-10-CM

## 2023-07-15 DIAGNOSIS — Z7984 Long term (current) use of oral hypoglycemic drugs: Secondary | ICD-10-CM

## 2023-07-15 DIAGNOSIS — Z794 Long term (current) use of insulin: Secondary | ICD-10-CM | POA: Diagnosis not present

## 2023-07-15 DIAGNOSIS — I152 Hypertension secondary to endocrine disorders: Secondary | ICD-10-CM

## 2023-07-15 DIAGNOSIS — E559 Vitamin D deficiency, unspecified: Secondary | ICD-10-CM

## 2023-07-15 DIAGNOSIS — Z6836 Body mass index (BMI) 36.0-36.9, adult: Secondary | ICD-10-CM

## 2023-07-15 MED ORDER — VITAMIN D (ERGOCALCIFEROL) 1.25 MG (50000 UNIT) PO CAPS: 50000.0000 [IU] | ORAL_CAPSULE | ORAL | 0 refills | Status: DC

## 2023-07-15 NOTE — Progress Notes (Signed)
Gerald Evans, D.O.  ABFM, ABOM Specializing in Clinical Bariatric Medicine  Office located at: [address] [place], [place]  [address]   Assessment and Plan:   FOR THE DISEASE OF OBESITY: BMI 36.0-36.9,adult - Current 36.8 Obesity with starting BMI of 36.8 Assessment & Plan: Since last office visit on 06/10/23 patient's muscle mass has decreased by 2.4lb. Fat mass has increased by 0.4lb. Total body water has decreased by 2.4lb.  Counseling done on how various foods will affect these numbers and how to maximize success  Total lbs lost to date: 0 lbs Total weight loss percentage to date: 0%    Recommended Dietary Goals Gerald Evans is currently in the action stage of change. As such, his goal is to continue weight management plan.  He has agreed to: continue current plan   Behavioral Intervention We discussed the following today: increasing lean protein intake to established goals, avoiding skipping meals, increasing water intake , work on meal planning and preparation, keeping healthy foods at home, and decreasing eating out or consumption of processed foods, and making healthy choices when eating convenient foods and portioning/weighing/measuring his foods to increase mindfulness.   Additional resources provided today: Handout on healthy eating and balanced plate, Handout on CAT 1-2 breakfast options, and Handout on CAT 1-2 lunch options  Evidence-based interventions for health behavior change were utilized today including the discussion of self monitoring techniques, problem-solving barriers and SMART goal setting techniques.   Regarding patient's less desirable eating habits and patterns, we employed the technique of small changes.   Pt will specifically work on: work on portion sizes and Media planner proteins (4 oz at lunch and 6-8 oz at dinner) for next visit.   Recommended Physical Activity Goals Gerald Evans has been advised to work up to 150 minutes of  moderate intensity aerobic activity a week and strengthening exercises 2-3 times per week for cardiovascular health, weight loss maintenance and preservation of muscle mass.   He has agreed to :  Think about enjoyable ways to increase daily physical activity and overcoming barriers to exercise and Increase physical activity in their day and reduce sedentary time (increase NEAT).   Pharmacotherapy We discussed various medication options to help Gerald Evans with his weight loss efforts and we both agreed to :  continue with nutritional and behavioral strategies and continue current anti-obesity medication regimen   FOR ASSOCIATED CONDITIONS ADDRESSED TODAY: Type 2 diabetes mellitus with hyperglycemia, with long-term current use of insulin [facility]) Assessment & Plan: Lab Results  Component Value Date   HGBA1C 9.4 (A) 04/15/2023   HGBA1C 8.2 (A) 01/13/2023   HGBA1C 7.8 (H) 07/23/2022   INSULIN 25.6 (H) 07/23/2022   Last A1C and insulin levels are uncontrolled at 9.4 and 25.6 respectively. He is currently on 40 units of insulin BID (mornings and nights) and Metformin 1000 once daily with breakfast. Tolerating all well, no SE. Pt was previously on Ozempic. He reports he would eat "bigger" breakfasts in the past and currently feels "starved". Pt has not been eating enough food due to lack of measuring portions or weighing his portions. Endorses skipped meals. Established with Gerald Bacon, NP of endocrinology, next follow up is on 1/21. Labs will be obtained at this visit.   Reviewed benefits of measuring and weighing his foods/proteins to ensure he is eating enough food and being more mindful about is protein intake. Avoid skipping meals. Limit rice and beans to 1 cup a meal. Prioritize eating his lean proteins to  meet his daily protein goal. Educated pt on insulin being an obesogenic drug that stores fat making it harder to lose weight. I recommend he be weaned off this medication if able and started on  Ozempic or Mounjaro. Per pt, he has already discussed with endo team about switching from Victoza to either Ozempic or Mounjaro. Pt thinks endo team said they can get these meds prescribed at largely reduced price or even free. Continue with his current regimen until he follows up with his endocrinology team. No changes made today.    Hypertension associated with type 2 diabetes mellitus [facility]) Assessment & Plan: BP Readings from Last 3 Encounters:  07/15/23 123/65  06/10/23 (!) 143/68  06/03/23 (!) 143/77   BP is stable at 123/65 and improved from readings at prior visits. He is on Metoprolol 100 mg BID, losartan 100 mg once daily, hydralazine 50 TID. Tolerating all antihypertensives well with no reported SE. Not currently taking chlorthalidone.   Discussed that Metoprolol is another obesogenic medication he is on. Consider discussing with PCP the possibility and of switching Metformin. I recommend switching to an ARBs or other class of medication that is not obesogenic. Encouraged pt to follow a heart healthy diet via his meal plan. Continue with current regimen until able to follow up with his PCP. No changes made today.    Vitamin D deficiency Assessment & Plan: Lab Results  Component Value Date   VD25OH 59.0 05/12/2023   VD25OH 35.4 03/17/2023   VD25OH 48.0 07/23/2022   Last vitamin D was at goal. Taking ERGO 50K units once weekly. Tolerating well with no side effects. No concerns today. Continue with current supplementation. No changes to dosage made today. We will monitor his condition as it pertains to his weight loss.   Orders: - Refill ERGO today, no dose changes.    Follow up:   Return in about 2 months (around 09/12/2023). He was informed of the importance of frequent follow up visits to maximize his success with intensive lifestyle modifications for his multiple health conditions.  Subjective:   Chief complaint: Obesity Gerald Evans is here to discuss his progress with his  obesity treatment plan. He is on the Category 2 Plan and states he is following his eating plan approximately 60% of the time. He states he is walking 15-30 minutes 3 days per week and exercising on machines for 15 minutes 3 days a week.   Interval History:  RUFFUS HOPP is here for a follow up office visit. Since last OV, he endorses some skipped meals. He states he feels "starved" since he used to eat a "bigger" breakfast. Yesterday he had 2 eggs, coffee, and a piece of fish (bass), about 6 grapes, and water for breakfast. For lunch, he had about 1 cup of white rice, fish (bass), canned beans, and vegetables. He notes he has not been weighing or measuring his proteins or portions.   Barriers identified: lack of time for self-care, having difficulty focusing on healthy eating, exposure to enticing environments and/or relationships, difficulty maintaining a reduced calorie state, and presence of obesogenic drugs.   Pharmacotherapy for weight loss: He is currently taking Metformin (off label use for incretin effect and / or insulin resistance and / or diabetes prevention) with adequate clinical response  and without side effects..   Review of Systems:  Pertinent positives were addressed with patient today.  Reviewed by clinician on day of visit: allergies, medications, problem list, medical history, surgical history, family history, social history,  and previous encounter notes.  Weight Summary and Biometrics   Weight Lost Since Last Visit: 2 lb  Weight Gained Since Last Visit: 0    Vitals Temp: 97.6 F (36.4 C) BP: 123/65 Pulse Rate: (!) 51 SpO2: 96 %   Anthropometric Measurements Height: 5\' 8"  (1.727 m) Weight: 242 lb (109.8 kg) BMI (Calculated): 36.8 Weight at Last Visit: 244 lb Weight Lost Since Last Visit: 2 lb Weight Gained Since Last Visit: 0 Starting Weight: 242 lb Total Weight Loss (lbs): 0 lb (0 kg) Peak Weight: 257 lb   Body Composition  Body Fat %: 37.3 % Fat  Mass (lbs): 90.4 lbs Muscle Mass (lbs): 144.4 lbs Total Body Water (lbs): 111.8 lbs Visceral Fat Rating : 25   Other Clinical Data Fasting: No Labs: No Today's Visit #: 29 Starting Date: 03/04/21    Objective:   PHYSICAL EXAM: Blood pressure 123/65, pulse (!) 51, temperature 97.6 F (36.4 C), height 5\' 8"  (1.727 m), weight 242 lb (109.8 kg), SpO2 96%. Body mass index is 36.8 kg/m.  General: he is overweight, cooperative and in no acute distress. PSYCH: Has normal mood, affect and thought process.   HEENT: EOMI, sclerae are anicteric. Lungs: Normal breathing effort, no conversational dyspnea. Extremities: Moves * 4 Neurologic: A and O * 3, good insight  DIAGNOSTIC DATA REVIEWED: BMET    Component Value Date/Time   NA 142 05/12/2023 1133   K 4.2 05/12/2023 1133   CL 107 (H) 05/12/2023 1133   CO2 20 05/12/2023 1133   GLUCOSE 123 (H) 05/12/2023 1133   GLUCOSE 234 (H) 08/21/2020 1437   BUN 21 05/12/2023 1133   CREATININE 1.30 (H) 05/12/2023 1133   CREATININE 1.00 03/18/2020 1321   CALCIUM 9.6 05/12/2023 1133   GFRNONAA >60 08/21/2020 1437   GFRNONAA 74 03/18/2020 1321   GFRAA 67 07/17/2020 0953   GFRAA 86 03/18/2020 1321   Lab Results  Component Value Date   HGBA1C 9.4 (A) 04/15/2023   HGBA1C 8.5 10/10/2019   Lab Results  Component Value Date   INSULIN 25.6 (H) 07/23/2022   Lab Results  Component Value Date   TSH 2.250 01/08/2023   CBC    Component Value Date/Time   WBC 7.5 07/23/2022 0921   WBC 13.6 (H) 08/21/2020 1437   RBC 5.26 07/23/2022 0921   RBC 4.85 08/21/2020 1437   HGB 12.5 (L) 07/23/2022 0921   HCT 39.0 07/23/2022 0921   PLT 218 07/23/2022 0921   MCV 74 (L) 07/23/2022 0921   MCH 23.8 (L) 07/23/2022 0921   MCH 24.3 (L) 08/21/2020 1437   MCHC 32.1 07/23/2022 0921   MCHC 33.1 08/21/2020 1437   RDW 18.3 (H) 07/23/2022 0921   Iron Studies    Component Value Date/Time   IRON 54 07/23/2022 0921   TIBC 285 07/23/2022 0921   FERRITIN 124  07/23/2022 0921   IRONPCTSAT 19 07/23/2022 0921   IRONPCTSAT 24 05/25/2016 0814   Lipid Panel     Component Value Date/Time   CHOL 128 07/23/2022 0921   CHOL 163 05/11/2022 1217   TRIG 96 07/23/2022 0921   TRIG 125 05/11/2022 1217   HDL 41 07/23/2022 0921   CHOLHDL 3.4 05/07/2022 1053   CHOLHDL 3.1 03/18/2020 1321   LDLCALC 69 07/23/2022 0921   LDLCALC 74 03/18/2020 1321   Hepatic Function Panel     Component Value Date/Time   PROT 6.6 05/12/2023 1133   ALBUMIN 4.2 05/12/2023 1133   AST 18 05/12/2023 1133  ALT 12 05/12/2023 1133   ALKPHOS 77 05/12/2023 1133   BILITOT 0.3 05/12/2023 1133   BILIDIR 0.1 05/14/2017 1333   IBILI 0.2 05/14/2017 1333      Component Value Date/Time   TSH 2.250 01/08/2023 1139   Nutritional Lab Results  Component Value Date   VD25OH 59.0 05/12/2023   VD25OH 35.4 03/17/2023   VD25OH 48.0 07/23/2022    Attestations:   I, Isabelle Course, acting as a medical scribe for [name], DO., have compiled all relevant documentation for today's office visit on behalf of [name], DO, while in the presence of [employer], DO.  Reviewed by clinician on day of visit: allergies, medications, problem list, medical history, surgical history, family history, social history, and previous encounter notes pertinent to patient's obesity diagnosis.  I have reviewed the above documentation for accuracy and completeness, and I agree with the above. Gerald Evans, D.O.  The 21st Century Cures Act was signed into law in 2016 which includes the topic of electronic health records.  This provides immediate access to information in MyChart.  This includes consultation notes, operative notes, office notes, lab results and pathology reports.  If you have any questions about what you read please let us know at your next visit so we can discuss your concerns and take corrective action if need be.  We are right here with you.

## 2023-07-17 LAB — COMPREHENSIVE METABOLIC PANEL
ALT: 12 [IU]/L (ref 0–44)
AST: 18 [IU]/L (ref 0–40)
Albumin: 4 g/dL (ref 3.8–4.8)
Alkaline Phosphatase: 88 [IU]/L (ref 44–121)
BUN/Creatinine Ratio: 15 (ref 10–24)
BUN: 18 mg/dL (ref 8–27)
Bilirubin Total: 0.4 mg/dL (ref 0.0–1.2)
CO2: 22 mmol/L (ref 20–29)
Calcium: 9.1 mg/dL (ref 8.6–10.2)
Chloride: 105 mmol/L (ref 96–106)
Creatinine, Ser: 1.2 mg/dL (ref 0.76–1.27)
Globulin, Total: 2.5 g/dL (ref 1.5–4.5)
Glucose: 93 mg/dL (ref 70–99)
Potassium: 3.9 mmol/L (ref 3.5–5.2)
Sodium: 142 mmol/L (ref 134–144)
Total Protein: 6.5 g/dL (ref 6.0–8.5)
eGFR: 63 mL/min/{1.73_m2} (ref >59)

## 2023-07-17 LAB — LIPID PANEL
Chol/HDL Ratio: 3.3 {ratio} (ref 0.0–5.0)
Cholesterol, Total: 151 mg/dL (ref 100–199)
HDL: 46 mg/dL (ref >39)
LDL Chol Calc (NIH): 91 mg/dL (ref 0–99)
Triglycerides: 73 mg/dL (ref 0–149)
VLDL Cholesterol Cal: 14 mg/dL (ref 5–40)

## 2023-07-17 LAB — TSH: TSH: 2.08 u[IU]/mL (ref 0.450–4.500)

## 2023-07-17 LAB — T4, FREE: Free T4: 1.23 ng/dL (ref 0.82–1.77)

## 2023-07-20 ENCOUNTER — Ambulatory Visit: Payer: Medicare HMO | Admitting: Nurse Practitioner

## 2023-07-20 ENCOUNTER — Encounter: Payer: Self-pay | Admitting: Nurse Practitioner

## 2023-07-20 VITALS — BP 130/80 | HR 50 | Ht 68.0 in | Wt 244.6 lb

## 2023-07-20 DIAGNOSIS — E039 Hypothyroidism, unspecified: Secondary | ICD-10-CM

## 2023-07-20 DIAGNOSIS — Z7985 Long-term (current) use of injectable non-insulin antidiabetic drugs: Secondary | ICD-10-CM | POA: Diagnosis not present

## 2023-07-20 DIAGNOSIS — E1165 Type 2 diabetes mellitus with hyperglycemia: Secondary | ICD-10-CM | POA: Diagnosis not present

## 2023-07-20 DIAGNOSIS — E782 Mixed hyperlipidemia: Secondary | ICD-10-CM

## 2023-07-20 DIAGNOSIS — I10 Essential (primary) hypertension: Secondary | ICD-10-CM | POA: Diagnosis not present

## 2023-07-20 DIAGNOSIS — Z794 Long term (current) use of insulin: Secondary | ICD-10-CM | POA: Diagnosis not present

## 2023-07-20 DIAGNOSIS — Z7984 Long term (current) use of oral hypoglycemic drugs: Secondary | ICD-10-CM | POA: Diagnosis not present

## 2023-07-20 DIAGNOSIS — E038 Other specified hypothyroidism: Secondary | ICD-10-CM

## 2023-07-20 LAB — POCT GLYCOSYLATED HEMOGLOBIN (HGB A1C): Hemoglobin A1C: 9.3 % — AB (ref 4.0–5.6)

## 2023-07-20 MED ORDER — METFORMIN HCL 1000 MG PO TABS: 1000.0000 mg | ORAL_TABLET | Freq: Every day | ORAL | 3 refills | Status: DC

## 2023-07-20 MED ORDER — LEVOTHYROXINE SODIUM 75 MCG PO TABS: 75.0000 ug | ORAL_TABLET | Freq: Every day | ORAL | 1 refills | Status: DC

## 2023-07-20 NOTE — Progress Notes (Signed)
07/20/2023, 12:06 PM  Endocrinology follow-up note   Subjective:    Patient ID: Gerald Evans, male    DOB: 1946/05/28.  Gerald Evans is being seen in follow-up after he was seen in consultation for management of currently uncontrolled symptomatic diabetes requested by  Billie Lade, MD.   Past Medical History:  Diagnosis Date   Colon polyps    Diabetes mellitus without complication (HCC)    Edema of both lower extremities    Fatty liver    Hypercholesteremia    Hypertension     Past Surgical History:  Procedure Laterality Date   COLONOSCOPY     COLONOSCOPY  04/21/2012   Procedure: COLONOSCOPY;  Surgeon: Malissa Hippo, MD;  Location: [facility];  Service: Endoscopy;  Laterality: N/A;  1200   COLONOSCOPY N/A 09/09/2017   Procedure: COLONOSCOPY;  Surgeon: Malissa Hippo, MD;  Location: [facility];  Service: Endoscopy;  Laterality: N/A;  1200-rescheduled to 3/14/ @12 :00pm per Dewayne Hatch   HERNIA REPAIR     POLYPECTOMY  09/09/2017   Procedure: POLYPECTOMY;  Surgeon: Malissa Hippo, MD;  Location: [facility];  Service: Endoscopy;;  Transverse Colon (CB)    Social History   Socioeconomic History   Marital status: Married    Spouse name: Not on file   Number of children: 2   Years of education: Not on file   Highest education level: Not on file  Occupational History   Occupation: retired  Tobacco Use   Smoking status: Former    Current packs/day: 0.50    Average packs/day: 0.5 packs/day for 20.0 years (10.0 ttl pk-yrs)    Types: Cigarettes   Smokeless tobacco: Never  Vaping Use   Vaping status: Never Used  Substance and Sexual Activity   Alcohol use: Yes    Alcohol/week: 4.0 standard drinks of alcohol    Types: 4 Standard drinks or equivalent per week   Drug use: No   Sexual activity: Not on file  Other Topics Concern   Not on file  Social History Narrative   Not on file    Social Drivers of Health   Financial Resource Strain: Low Risk  (11/18/2022)   Overall Financial Resource Strain (CARDIA)    Difficulty of Paying Living Expenses: Not hard at all  Food Insecurity: No Food Insecurity (11/18/2022)   Hunger Vital Sign    Worried About Running Out of Food in the Last Year: Never true    Ran Out of Food in the Last Year: Never true  Transportation Needs: No Transportation Needs (11/18/2022)   PRAPARE - Administrator, Civil Service (Medical): No    Lack of Transportation (Non-Medical): No  Physical Activity: Sufficiently Active (11/18/2022)   Exercise Vital Sign    Days of Exercise per Week: 7 days    Minutes of Exercise per Session: 30 min  Stress: No Stress Concern Present (11/18/2022)   [employer] of Occupational Health - Occupational Stress Questionnaire    Feeling of Stress : Not at all  Social Connections: Socially Integrated (11/18/2022)   Social Connection and Isolation Panel [NHANES]  Frequency of Communication with Friends and Family: More than three times a week    Frequency of Social Gatherings with Friends and Family: More than three times a week    Attends Religious Services: More than 4 times per year    Active Member of [employer] or Organizations: Yes    Attends Engineer, structural: More than 4 times per year    Marital Status: Married    Family History  Problem Relation Age of Onset   Hypertension Mother    Hypertension Father    Alcoholism Father    Obesity Father    Diabetes Sister    Hypertension Sister    Healthy Daughter    Healthy Son    Colon cancer Neg Hx     Outpatient Encounter Medications as of 07/20/2023  Medication Sig   blood glucose meter kit and supplies Dispense based on patient and insurance preference. Use up to four times daily as directed. (FOR ICD-10 E11.65)   chlorthalidone (HYGROTON) 25 MG tablet Take 1 tablet (25 mg total) by mouth daily.   glucose blood (ONETOUCH ULTRA) test  strip USE 1 STRIP TO CHECK GLUCOSE THREE TIMES DAILY AS DIRECTED E11.65   hydrALAZINE (APRESOLINE) 50 MG tablet TAKE 1 TABLET BY MOUTH THREE TIMES DAILY   insulin aspart protamine - aspart (NOVOLOG 70/30 FLEXPEN) (70-30) 100 UNIT/ML FlexPen Inject 40 Units into the skin 2 (two) times daily with a meal.   Insulin Pen Needle (BD PEN NEEDLE NANO 2ND GEN) 32G X 4 MM MISC 1 Package by Does not apply route 2 (two) times daily.   losartan (COZAAR) 100 MG tablet Take 100 mg by mouth daily.   metoprolol (LOPRESSOR) 100 MG tablet Take 100 mg by mouth 2 (two) times daily.    OneTouch Delica Lancets 30G MISC 1 each by Does not apply route 3 (three) times daily. Use to check blood glucose three times daily   sildenafil (VIAGRA) 100 MG tablet Take 1/2 tab in combination with 10mg  of tadalafil.   simvastatin (ZOCOR) 20 MG tablet Take 1 tablet (20 mg total) by mouth daily.   tadalafil (CIALIS) 20 MG tablet Use either 1 tab po daily prn or 1/2 tab po with 1/2 tab sildenafil 100mg  po daily prn   terbinafine (LAMISIL) 250 MG tablet Take 1 tablet by mouth once daily   Vitamin D, Ergocalciferol, (DRISDOL) 1.25 MG (50000 UNIT) CAPS capsule Take 1 capsule (50,000 Units total) by mouth every 7 (seven) days.   [DISCONTINUED] levothyroxine (SYNTHROID) 75 MCG tablet Take 1 tablet (75 mcg total) by mouth daily before breakfast.   [DISCONTINUED] liraglutide (VICTOZA) 18 MG/3ML SOPN Inject 0.6 mg into the skin daily.   [DISCONTINUED] metFORMIN (GLUCOPHAGE) 1000 MG tablet Take 1 tablet (1,000 mg total) by mouth daily with breakfast.   levothyroxine (SYNTHROID) 75 MCG tablet Take 1 tablet (75 mcg total) by mouth daily before breakfast.   metFORMIN (GLUCOPHAGE) 1000 MG tablet Take 1 tablet (1,000 mg total) by mouth daily with breakfast.   No facility-administered encounter medications on file as of 07/20/2023.    ALLERGIES: Allergies  Allergen Reactions   Lisinopril     cough    VACCINATION STATUS: Immunization History   Administered Date(s) Administered   Fluad Trivalent(High Dose 65+) 05/07/2023   PFIZER Comirnaty(Gray Top)Covid-19 Tri-Sucrose Vaccine 11/12/2020   PFIZER(Purple Top)SARS-COV-2 Vaccination 08/20/2019, 09/12/2019, 04/25/2020   PNEUMOCOCCAL CONJUGATE-20 01/06/2022   Pfizer Covid-19 Vaccine Bivalent Booster 87yrs & up 07/17/2021   Pneumococcal Polysaccharide-23 05/10/2018  Tdap 12/20/2017   Zoster Recombinant(Shingrix) 01/06/2022, 03/18/2022    Diabetes He presents for his follow-up diabetic visit. He has type 2 diabetes mellitus. Onset time: He was diagnosed at approximate age of 23 years. His disease course has been stable. There are no hypoglycemic associated symptoms. Pertinent negatives for hypoglycemia include no confusion, headaches, nervousness/anxiousness, pallor or seizures. Associated symptoms include fatigue. Pertinent negatives for diabetes include no chest pain, no polydipsia, no polyphagia, no polyuria, no weakness and no weight loss. There are no hypoglycemic complications. Symptoms are stable. Diabetic complications include heart disease, impotence and nephropathy. Risk factors for coronary artery disease include dyslipidemia, diabetes mellitus, family history, tobacco exposure, sedentary lifestyle, male sex, obesity and hypertension. Current diabetic treatment includes oral agent (monotherapy) and insulin injections (PCP recently started him on Ozempic but he could not afford it). He is compliant with treatment most of the time. His weight is fluctuating minimally. He is following a generally unhealthy diet. When asked about meal planning, he reported none. He has not had a previous visit with a dietitian. He participates in exercise three times a week. His home blood glucose trend is fluctuating minimally. His overall blood glucose range is 140-180 mg/dl. (He presents today with his meter, no logs, showing inconsistent glucose monitoring (only checking glucose on average once daily),  with above target glycemic profile.  His POCT A1c today is 9.3%, essentially unchanged from previous visit.  Analysis of his meter shows 7-day average of 161, 14-day average of 150, 30-day average of 169.  He has glucose ranging between 92-225 in his meter.  He denies any hypoglycemia.) An ACE inhibitor/angiotensin II receptor blocker is being taken. He does not see a podiatrist.Eye exam is current.  Hyperlipidemia This is a chronic problem. The current episode started more than 1 year ago. The problem is controlled. Recent lipid tests were reviewed and are normal. Exacerbating diseases include chronic renal disease, diabetes and obesity. Factors aggravating his hyperlipidemia include fatty foods, beta blockers and thiazides. Pertinent negatives include no chest pain, myalgias or shortness of breath. Current antihyperlipidemic treatment includes statins. The current treatment provides mild improvement of lipids. Compliance problems include adherence to diet and adherence to exercise.  Risk factors for coronary artery disease include diabetes mellitus, dyslipidemia, hypertension, male sex, obesity, a sedentary lifestyle and family history.  Hypertension This is a chronic problem. The current episode started more than 1 year ago. The problem has been waxing and waning since onset. The problem is uncontrolled. Pertinent negatives include no chest pain, headaches, neck pain, palpitations or shortness of breath. There are no associated agents to hypertension. Risk factors for coronary artery disease include dyslipidemia, diabetes mellitus, male gender, obesity and smoking/tobacco exposure. Past treatments include angiotensin blockers, diuretics, beta blockers and central alpha agonists. The current treatment provides mild improvement. There are no compliance problems.  Hypertensive end-organ damage includes kidney disease and CAD/MI. Identifiable causes of hypertension include chronic renal disease and a thyroid  problem.  Thyroid Problem Presents for initial visit. Symptoms include fatigue and weight gain. Patient reports no anxiety, cold intolerance, constipation, depressed mood, hair loss, leg swelling, palpitations or weight loss. The symptoms have been stable. Past treatments include nothing. His past medical history is significant for diabetes, hyperlipidemia and obesity. There are no known risk factors.    Review of systems  Constitutional: + minimally fluctuating body weight,  current Body mass index is 37.19 kg/m. , no fatigue, no subjective hyperthermia, no subjective hypothermia Eyes: no blurry vision, no xerophthalmia  ENT: no sore throat, no nodules palpated in throat, no dysphagia/odynophagia, no hoarseness Cardiovascular: no chest pain, no shortness of breath, no palpitations, no leg swelling Respiratory: no cough, no shortness of breath Gastrointestinal: no nausea/vomiting/diarrhea Musculoskeletal: no muscle/joint aches Skin: no rashes, no hyperemia Neurological: no tremors, no numbness, no tingling, no dizziness Psychiatric: no depression, no anxiety  Objective:    BP 130/80 (BP Location: Left Arm, Patient Position: Sitting, Cuff Size: Large)   Pulse (!) 50   Ht 5\' 8"  (1.727 m)   Wt 244 lb 9.6 oz (110.9 kg)   BMI 37.19 kg/m   Wt Readings from Last 3 Encounters:  07/20/23 244 lb 9.6 oz (110.9 kg)  07/15/23 242 lb (109.8 kg)  06/10/23 244 lb (110.7 kg)    BP Readings from Last 3 Encounters:  07/20/23 130/80  07/15/23 123/65  06/10/23 (!) 143/68     Physical Exam- Limited  Constitutional:  Body mass index is 37.19 kg/m. , not in acute distress, normal state of mind Eyes:  EOMI, no exophthalmos Musculoskeletal: no gross deformities, strength intact in all four extremities, no gross restriction of joint movements Skin:  no rashes, no hyperemia Neurological: no tremor with outstretched hands  Diabetic Foot Exam - Simple   No data filed     CMP ( most  recent) CMP     Component Value Date/Time   NA 142 07/16/2023 1301   K 3.9 07/16/2023 1301   CL 105 07/16/2023 1301   CO2 22 07/16/2023 1301   GLUCOSE 93 07/16/2023 1301   GLUCOSE 234 (H) 08/21/2020 1437   BUN 18 07/16/2023 1301   CREATININE 1.20 07/16/2023 1301   CREATININE 1.00 03/18/2020 1321   CALCIUM 9.1 07/16/2023 1301   PROT 6.5 07/16/2023 1301   ALBUMIN 4.0 07/16/2023 1301   AST 18 07/16/2023 1301   ALT 12 07/16/2023 1301   ALKPHOS 88 07/16/2023 1301   BILITOT 0.4 07/16/2023 1301   GFRNONAA >60 08/21/2020 1437   GFRNONAA 74 03/18/2020 1321   GFRAA 67 07/17/2020 0953   GFRAA 86 03/18/2020 1321    Diabetic Labs (most recent): Lab Results  Component Value Date   HGBA1C 9.3 (A) 07/20/2023   HGBA1C 9.4 (A) 04/15/2023   HGBA1C 8.2 (A) 01/13/2023   MICROALBUR 80mg /L 04/15/2023   MICROALBUR 80 mg/l 05/11/2022   MICROALBUR 150 04/15/2021     Assessment & Plan:   1) Uncontrolled type 2 diabetes mellitus with hyperglycemia (HCC)  - Gerald Evans has currently uncontrolled symptomatic type 2 DM since  78 years of age.  He presents today with his meter, no logs, showing inconsistent glucose monitoring (only checking glucose on average once daily), with above target glycemic profile.  His POCT A1c today is 9.3%, essentially unchanged from previous visit.  Analysis of his meter shows 7-day average of 161, 14-day average of 150, 30-day average of 169.  He has glucose ranging between 92-225 in his meter.  He denies any hypoglycemia.   Recent labs reviewed.  His POCT UM shows mild microalbuminuria, consistent with his stage 3a kidney disease.  - I had a long discussion with him about the progressive nature of diabetes and the pathology behind its complications.  -his diabetes is complicated by obesity/sedentary life and he remains at a high risk for more acute and chronic complications which include CAD, CVA, CKD, retinopathy, and neuropathy. These are all discussed in  detail with him.  - Nutritional counseling repeated at each appointment due to patients tendency to  fall back in to old habits.  - The patient admits there is a room for improvement in their diet and drink choices. -  Suggestion is made for the patient to avoid simple carbohydrates from their diet including Cakes, Sweet Desserts / Pastries, Ice Cream, Soda (diet and regular), Sweet Tea, Candies, Chips, Cookies, Sweet Pastries, Store Bought Juices, Alcohol in Excess of 1-2 drinks a day, Artificial Sweeteners, Coffee Creamer, and "Sugar-free" Products. This will help patient to have stable blood glucose profile and potentially avoid unintended weight gain.   - I encouraged the patient to switch to unprocessed or minimally processed complex starch and increased protein intake (animal or plant source), fruits, and vegetables.   - Patient is advised to stick to a routine mealtimes to eat 3 meals a day and avoid unnecessary snacks (to snack only to correct hypoglycemia).  - I have approached him with the following individualized plan to manage  his diabetes and patient agrees:   - he will continue to require insulin treatment in order for him to achieve and maintain control of diabetes to target.    -He is benefiting from simplified insulin regimen with premixed insulin.    -He is advised to continue his 70/30 20 units SQ BID with meals if glucose is above 90 and he is eating.  He can continue Metformin 1000 mg po daily with breakfast (kidneys are stable).  He could not afford copay for Ozempic, I gave him a sample today instructed him to start 0.25 mg SQ weekly x 2 doses then increase to 0.5 mg SQ weekly thereafter. He did bring his PAP forms with him today, will send off and see if he qualifies.  -He is encouraged to do better monitoring his blood glucose.  He is urged to monitor glucose at least twice per day, before injecting insulin (at breakfast and supper) and call the clinic if readings are less  than 70 or greater than 300 for 3 tests in a row.  He could benefit from CGM device but is not interested at this time.  - Specific targets for  A1c;  LDL, HDL,  and Triglycerides were discussed with the patient.  2) Blood Pressure /Hypertension:  His blood pressure is controlled to target for his age.  He is advised to continue Clonidine 0.2 mg po twice daily, HCTZ 25 mg po daily, Losartan 100 mg po daily, and Metoprolol 100 mg po twice daily.    3) Lipids/Hyperlipidemia:  His most recent lipid panel from 03/04/21 shows controlled LDL of 77.  He is advised to continue Simvastatin 20 mg po daily at bedtime.  Side effects and precautions discussed with him.    4)  Weight/Diet:  His Body mass index is 37.19 kg/m.  -   clearly complicating his diabetes care.   he is  a candidate for weight loss. I discussed with him the fact that loss of 5 - 10% of his  current body weight will have the most impact on his diabetes management.  Exercise, and detailed carbohydrates information provided  -  detailed on discharge instructions.  5) Hypothyroidism-acquired He does not have personal or family history of thyroid dysfunction.  His antibody testing was negative ruling out autoimmune thyroid dysfunction.    His previsit TFTs are consistent with appropriate hormone replacement.  He is advised to continue his Levothyroxine 75 mcg po daily before breakfast.  Will recheck TFTs prior to next visit and adjust accordingly.   - The correct intake of  thyroid hormone (Levothyroxine, Synthroid), is on empty stomach first thing in the morning, with water, separated by at least 30 minutes from breakfast and other medications,  and separated by more than 4 hours from calcium, iron, multivitamins, acid reflux medications (PPIs).  - This medication is a life-long medication and will be needed to correct thyroid hormone imbalances for the rest of your life.  The dose may change from time to time, based on thyroid blood  work.  - It is extremely important to be consistent taking this medication, near the same time each morning.  -AVOID TAKING PRODUCTS CONTAINING BIOTIN (commonly found in Hair, Skin, Nails vitamins) AS IT INTERFERES WITH THE VALIDITY OF THYROID FUNCTION BLOOD TESTS.  6) Chronic Care/Health Maintenance: -he is on ACEI/ARB and Statin medications and is encouraged to initiate and continue to follow up with Ophthalmology, Dentist,  Podiatrist at least yearly or according to recommendations, and advised to  stay away from smoking. I have recommended yearly flu vaccine and pneumonia vaccine at least every 5 years; moderate intensity exercise for up to 150 minutes weekly; and  sleep for at least 7 hours a day.  - he is advised to maintain close follow up with Durwin Nora, Lucina Mellow, MD for primary care needs, as well as his other providers for optimal and coordinated care.     I spent  24  minutes in the care of the patient today including review of labs from CMP, Lipids, Thyroid Function, Hematology (current and previous including abstractions from other facilities); face-to-face time discussing  his blood glucose readings/logs, discussing hypoglycemia and hyperglycemia episodes and symptoms, medications doses, his options of short and long term treatment based on the latest standards of care / guidelines;  discussion about incorporating lifestyle medicine;  and documenting the encounter. Risk reduction counseling performed per USPSTF guidelines to reduce obesity and cardiovascular risk factors.     Please refer to Patient Instructions for Blood Glucose Monitoring and Insulin/Medications Dosing Guide"  in media tab for additional information. Please  also refer to " Patient Self Inventory" in the Media  tab for reviewed elements of pertinent patient history.  Gerald Evans participated in the discussions, expressed understanding, and voiced agreement with the above plans.  All questions were answered to his  satisfaction. he is encouraged to contact clinic should he have any questions or concerns prior to his return visit.  Follow up plan: - Return in about 3 months (around 10/18/2023) for Diabetes F/U with A1c in office, Thyroid follow up, No previsit labs, Bring meter and logs.  Ronny Bacon, [facility] [address] [place], [place] [address] Phone: [phone] Fax: [phone]  07/20/2023, 12:06 PM

## 2023-07-20 NOTE — Patient Instructions (Signed)

## 2023-07-26 ENCOUNTER — Telehealth: Payer: Self-pay | Admitting: Nurse Practitioner

## 2023-07-26 NOTE — Telephone Encounter (Signed)
Patient called and had a question about his medication. Ask for a call back.I shared Whitney recommendation with him, He will need some insulin for tonight.

## 2023-07-26 NOTE — Telephone Encounter (Signed)
Pt is out of Novolog and has not received his patient assistance yet. What do you want pt to do , as we have no samples

## 2023-07-26 NOTE — Telephone Encounter (Signed)
He can buy the OTC premixed insulin at [facility] in the vials.  It isn't ideal but it may be all we can do.  I will attach Selena Batten for her to address when she is here tomorrow.

## 2023-07-27 NOTE — Telephone Encounter (Signed)
An approval from [employer] [facility] is in media dated 1/24. However the normal turn around time for shipments which is 14 business days has delays and they state it will not be resolved for several weeks.

## 2023-07-27 NOTE — Telephone Encounter (Signed)
Patient was called and made aware.

## 2023-07-27 NOTE — Telephone Encounter (Signed)
Talked with the patient last night. He was going to [place] to get.

## 2023-08-05 ENCOUNTER — Ambulatory Visit (INDEPENDENT_AMBULATORY_CARE_PROVIDER_SITE_OTHER): Payer: Medicare HMO | Admitting: Family Medicine

## 2023-08-05 ENCOUNTER — Encounter (INDEPENDENT_AMBULATORY_CARE_PROVIDER_SITE_OTHER): Payer: Self-pay | Admitting: Family Medicine

## 2023-08-05 VITALS — BP 156/91 | HR 56 | Temp 97.5°F | Ht 68.0 in | Wt 240.0 lb

## 2023-08-05 DIAGNOSIS — E669 Obesity, unspecified: Secondary | ICD-10-CM

## 2023-08-05 DIAGNOSIS — Z794 Long term (current) use of insulin: Secondary | ICD-10-CM

## 2023-08-05 DIAGNOSIS — I152 Hypertension secondary to endocrine disorders: Secondary | ICD-10-CM

## 2023-08-05 DIAGNOSIS — E1159 Type 2 diabetes mellitus with other circulatory complications: Secondary | ICD-10-CM

## 2023-08-05 DIAGNOSIS — Z6836 Body mass index (BMI) 36.0-36.9, adult: Secondary | ICD-10-CM

## 2023-08-05 DIAGNOSIS — Z7985 Long-term (current) use of injectable non-insulin antidiabetic drugs: Secondary | ICD-10-CM

## 2023-08-05 DIAGNOSIS — Z7984 Long term (current) use of oral hypoglycemic drugs: Secondary | ICD-10-CM

## 2023-08-05 DIAGNOSIS — E1165 Type 2 diabetes mellitus with hyperglycemia: Secondary | ICD-10-CM | POA: Diagnosis not present

## 2023-08-05 DIAGNOSIS — E65 Localized adiposity: Secondary | ICD-10-CM

## 2023-08-05 NOTE — Progress Notes (Signed)
 [name], D.O.  ABFM, ABOM Specializing in Clinical Bariatric Medicine  Office located at: [address] [place], [place]  [address]   Assessment and Plan:   FOR THE DISEASE OF OBESITY: BMI 36.0-36.9,adult - Current 36.5 Obesity with starting BMI of 36.8 Assessment & Plan: Since last office visit on 07/15/2023 patient's  Muscle mass has increased by 1.6 lb. Fat mass has decreased by 3.4 lb. Total body water  has decreased by 0.2 lb.  Counseling done on how various foods will affect these numbers and how to maximize success  Total lbs lost to date: 2 lbs  Total weight loss percentage to date: 0.83%    Recommended Dietary Goals [name] is currently in the action stage of change. As such, his goal is to continue weight management plan.  He has agreed to: continue current plan   Behavioral Intervention We discussed the following today: using GPT or another AI platform for recipe ideas- searching low calorie, low carb, high protein chicken recipes etc  Additional resources provided today:  handout on non-starchy vegetables, handout on Crock-Pot recipes, handout on CHAT-GPT inspired recipes.   Evidence-based interventions for health behavior change were utilized today including the discussion of self monitoring techniques, problem-solving barriers and SMART goal setting techniques.   Regarding patient's less desirable eating habits and patterns, we employed the technique of small changes.   Pt will specifically work on: n/a    Recommended Physical Activity Goals [name] has been advised to work up to 150 minutes of moderate intensity aerobic activity a week and strengthening exercises 2-3 times per week for cardiovascular health, weight loss maintenance and preservation of muscle mass.   He has agreed to : return to the gym at least 5 days a week.    Pharmacotherapy We both agreed to : continue with nutritional and behavioral strategies and continue all  medications.   FOR ASSOCIATED CONDITIONS ADDRESSED TODAY:  Type 2 diabetes mellitus with hyperglycemia, with long-term current use of insulin  (HCC) Assessment & Plan:: Relevant medications: Metformin  1000 mg daily, Novalog 40 units two times daily, and Ozempic  0.5 mg weekly. He restarted Ozempic  roughly 3 weeks ago; he obtains it via a PAP through his endocrinology provider.  Recent fasting blood sugars: 130s - 140s. His most recent Hemoglobin A1c was poorly controlled at 9.3 - he attributes this to eating excessive sweets over the holiday period.   Continue medication regimen. Discussed the possible risks associated with having a poorly controlled A1c.  Pt counseled on the importance of decreasing simple carbs, increasing proteins and how certain foods they eat will affect their blood sugars. Losing 10% or more of body weight may improve condition.    Hypertension associated with type 2 diabetes mellitus (HCC) Assessment & Plan: Last 3 blood pressure readings in our office are as follows: BP Readings from Last 3 Encounters:  08/05/23 (!) 156/91  07/20/23 130/80  07/15/23 123/65   HTN treated with Chlorthalidone , Hydralazine , Losartan , and Metoprolol . BP is elevated - pt thinks he may have forgotten to take some of his antihypertensives today. Discussed Goal BP: 140/80 or less. Pt instructed to check BP daily - bring in values to next OV. Pt again educated that Metoprolol  can be counterproductive to weight loss - discuss with PCP about possibly decreasing Metroprolol dose. Continue with low sodium diet, advance exercise as tolerated.    Visceral obesity Assessment & Plan: Current visceral fat rating: 24.  The visceral fat rating should be < 10-12 in a  male.    Visceral adipose tissue is a hormonally active component of total body fat. This body composition phenotype is associated with medical disorders such as metabolic syndrome, cardiovascular disease and several malignancies including  prostate, breast, and colorectal cancers. Goal: Lose 7-10% of weight via prudent nutritional plan, GLP-1 therapy, and lifestyle changes.     Follow up:   Return 09/02/2023. He was informed of the importance of frequent follow up visits to maximize his success with intensive lifestyle modifications for his multiple health conditions.  Subjective:   Chief complaint: Obesity Gerald Evans is here to discuss his progress with his obesity treatment plan. He is on the Category 2 Plan and states he is following his eating plan approximately 65% of the time. He states he is walking on treadmill 30 minutes 3 days per week.   Interval History:  Gerald Evans is here for a follow up office visit. Since last OV on 07/15/2023, Mr.Gerald Evans is down 2 lbs. Yesterday, he found out that his dear friend living in [place]  has a brain tumor. In the near future, his wife will be traveling to [place]  for a month or so to visit their friend. Pt is worried about being on his own and having to cook and clean. He requests simple recipe ideas. Food wise, pt is currently trying to avoid simple carbs and increasing his lean protein intake.   Pharmacotherapy for weight loss: He is currently taking  Ozempic  0.5 mg weekly and Metformin  1000 mg daily  Review of Systems:  Pertinent positives were addressed with patient today.  Reviewed by clinician on day of visit: allergies, medications, problem list, medical history, surgical history, family history, social history, and previous encounter notes.  Weight Summary and Biometrics   Weight Lost Since Last Visit: 2lb  Weight Gained Since Last Visit: 0  Vitals Temp: (!) 97.5 F (36.4 C) BP: (!) 156/91 Pulse Rate: (!) 56 SpO2: 97 %   Anthropometric Measurements Height: 5' 8 (1.727 m) Weight: 240 lb (108.9 kg) BMI (Calculated): 36.5 Weight at Last Visit: 242lb Weight Lost Since Last Visit: 2lb Weight Gained Since Last Visit: 0 Starting Weight: 242lb Total  Weight Loss (lbs): 2 lb (0.907 kg) Peak Weight: 257lb   Body Composition  Body Fat %: 36.2 % Fat Mass (lbs): 87 lbs Muscle Mass (lbs): 146 lbs Total Body Water  (lbs): 111.6 lbs Visceral Fat Rating : 24   Other Clinical Data Fasting: no Labs: no Today's Visit #: 30 Starting Date: 03/04/21    Objective:   PHYSICAL EXAM: Blood pressure (!) 156/91, pulse (!) 56, temperature (!) 97.5 F (36.4 C), height 5' 8 (1.727 m), weight 240 lb (108.9 kg), SpO2 97%. Body mass index is 36.49 kg/m.  General: he is overweight, cooperative and in no acute distress. PSYCH: Has normal mood, affect and thought process.   HEENT: EOMI, sclerae are anicteric. Lungs: Normal breathing effort, no conversational dyspnea. Extremities: Moves * 4 Neurologic: A and O * 3, good insight  DIAGNOSTIC DATA REVIEWED: BMET    Component Value Date/Time   NA 142 07/16/2023 1301   K 3.9 07/16/2023 1301   CL 105 07/16/2023 1301   CO2 22 07/16/2023 1301   GLUCOSE 93 07/16/2023 1301   GLUCOSE 234 (H) 08/21/2020 1437   BUN 18 07/16/2023 1301   CREATININE 1.20 07/16/2023 1301   CREATININE 1.00 03/18/2020 1321   CALCIUM  9.1 07/16/2023 1301   GFRNONAA >60 08/21/2020 1437   GFRNONAA 74 03/18/2020 1321   GFRAA 67 07/17/2020  9046   GFRAA 86 03/18/2020 1321   Lab Results  Component Value Date   HGBA1C 9.3 (A) 07/20/2023   HGBA1C 8.5 10/10/2019   Lab Results  Component Value Date   INSULIN  25.6 (H) 07/23/2022   Lab Results  Component Value Date   TSH 2.080 07/16/2023   CBC    Component Value Date/Time   WBC 7.5 07/23/2022 0921   WBC 13.6 (H) 08/21/2020 1437   RBC 5.26 07/23/2022 0921   RBC 4.85 08/21/2020 1437   HGB 12.5 (L) 07/23/2022 0921   HCT 39.0 07/23/2022 0921   PLT 218 07/23/2022 0921   MCV 74 (L) 07/23/2022 0921   MCH 23.8 (L) 07/23/2022 0921   MCH 24.3 (L) 08/21/2020 1437   MCHC 32.1 07/23/2022 0921   MCHC 33.1 08/21/2020 1437   RDW 18.3 (H) 07/23/2022 0921   Iron Studies     Component Value Date/Time   IRON 54 07/23/2022 0921   TIBC 285 07/23/2022 0921   FERRITIN 124 07/23/2022 0921   IRONPCTSAT 19 07/23/2022 0921   IRONPCTSAT 24 05/25/2016 0814   Lipid Panel     Component Value Date/Time   CHOL 151 07/16/2023 1301   CHOL 163 05/11/2022 1217   TRIG 73 07/16/2023 1301   TRIG 125 05/11/2022 1217   HDL 46 07/16/2023 1301   CHOLHDL 3.3 07/16/2023 1301   CHOLHDL 3.1 03/18/2020 1321   LDLCALC 91 07/16/2023 1301   LDLCALC 74 03/18/2020 1321   Hepatic Function Panel     Component Value Date/Time   PROT 6.5 07/16/2023 1301   ALBUMIN 4.0 07/16/2023 1301   AST 18 07/16/2023 1301   ALT 12 07/16/2023 1301   ALKPHOS 88 07/16/2023 1301   BILITOT 0.4 07/16/2023 1301   BILIDIR 0.1 05/14/2017 1333   IBILI 0.2 05/14/2017 1333      Component Value Date/Time   TSH 2.080 07/16/2023 1301   Nutritional Lab Results  Component Value Date   VD25OH 59.0 05/12/2023   VD25OH 35.4 03/17/2023   VD25OH 48.0 07/23/2022    Attestations:   I, Special Puri, acting as a stage manager for [employer], DO., have compiled all relevant documentation for today's office visit on behalf of Barnie Jenkins, DO, while in the presence of [employer], DO.  I have reviewed the above documentation for accuracy and completeness, and I agree with the above. Barnie JINNY Jenkins, D.O.  The 21st Century Cures Act was signed into law in 2016 which includes the topic of electronic health records.  This provides immediate access to information in MyChart.  This includes consultation notes, operative notes, office notes, lab results and pathology reports.  If you have any questions about what you read please let us  know at your next visit so we can discuss your concerns and take corrective action if need be.  We are right here with you.

## 2023-08-06 ENCOUNTER — Ambulatory Visit: Payer: Medicare HMO | Admitting: Internal Medicine

## 2023-08-06 ENCOUNTER — Encounter: Payer: Self-pay | Admitting: Internal Medicine

## 2023-08-06 VITALS — BP 172/69 | HR 72 | Ht 68.0 in | Wt 245.2 lb

## 2023-08-06 DIAGNOSIS — E1165 Type 2 diabetes mellitus with hyperglycemia: Secondary | ICD-10-CM

## 2023-08-06 DIAGNOSIS — I10 Essential (primary) hypertension: Secondary | ICD-10-CM | POA: Diagnosis not present

## 2023-08-06 DIAGNOSIS — Z6837 Body mass index (BMI) 37.0-37.9, adult: Secondary | ICD-10-CM

## 2023-08-06 DIAGNOSIS — E66812 Obesity, class 2: Secondary | ICD-10-CM

## 2023-08-06 DIAGNOSIS — E782 Mixed hyperlipidemia: Secondary | ICD-10-CM

## 2023-08-06 MED ORDER — ATORVASTATIN CALCIUM 40 MG PO TABS
40.0000 mg | ORAL_TABLET | Freq: Every day | ORAL | 3 refills | Status: DC
Start: 2023-08-06 — End: 2023-12-21

## 2023-08-06 MED ORDER — CHLORTHALIDONE 25 MG PO TABS: 25.0000 mg | ORAL_TABLET | Freq: Every day | ORAL | 3 refills | Status: DC

## 2023-08-06 NOTE — Assessment & Plan Note (Addendum)
 His acute concern today is poorly controlled hypertension.  BP is elevated today at 172/69 and 167/82.  He is currently prescribed antihypertensive regimen includes losartan  100 mg daily, metoprolol  tartrate 100 mg twice daily, hydralazine  50 mg 3 times daily, and chlorthalidone  25 mg daily.  He has previously been well-controlled on this regimen, but reports today that he has not recently taken chlorthalidone  as he thought he was supposed to stop after lower extremity edema resolved.  He is also concerned about continuing metoprolol  tartrate due to obesogenic properties. -Resume chlorthalidone  25 mg daily.  Continue losartan  and hydralazine  as currently prescribed.  Through shared decision making, he will stop metoprolol  given concern for obesogenic properties.  Can consider addition of calcium  channel blocker if needed for improved HTN control. -Nurse visit in 2 weeks for BP check and repeat CMP.

## 2023-08-06 NOTE — Assessment & Plan Note (Signed)
 BMI 37.2.  Current weight 245 pounds.  He has lost 5 pounds since his last appointment with me.  Closely followed by healthy weight and wellness.  He has also recently started Ozempic .

## 2023-08-06 NOTE — Telephone Encounter (Signed)
 Pt assistance ready for pick up. Patient notified via phone but no answer. VM FULL

## 2023-08-06 NOTE — Patient Instructions (Signed)
 It was a pleasure to see you today.  Thank you for giving us  the opportunity to be involved in your care.  Below is a brief recap of your visit and next steps.  We will plan to see you again in 3 months.  Summary Resume chlorthalidone  Stop simvastatin  Start atorvastatin  40 mg daily Follow up in 3 months with me Nurse visit in 2 weeks for BP check

## 2023-08-06 NOTE — Assessment & Plan Note (Signed)
 Followed by endocrinology.  A1c 9.3 on labs from last month.  He has recently started Ozempic .

## 2023-08-06 NOTE — Progress Notes (Signed)
 Established Patient Office Visit  Subjective   Patient ID: Gerald Evans, male    DOB: 1946/05/17  Age: 78 y.o. MRN: [id number]  Chief Complaint  Patient presents with   Care Management    Follow up   Gerald Evans returns to care today for routine follow-up.  He was last evaluated by me in November 2024.  Terbinafine  was prescribed for treatment of onychomycosis at that time.  No additional medication changes were made and 76-month follow-up was arranged.  In the interim, he has been evaluated by healthy weight and wellness on multiple occasions, urology, and endocrinology.  There have otherwise been no acute interval events. Gerald Evans reports feeling well today.  He is asymptomatic currently.  His acute concern is poorly controlled HTN.  He reports that he stopped taking chlorthalidone  after lower extremity edema resolved and has been asked by Dr. Midge to discuss discontinuing metoprolol  given obesogenic properties.  Past Medical History:  Diagnosis Date   Colon polyps    Diabetes mellitus without complication (HCC)    Edema of both lower extremities    Fatty liver    Hypercholesteremia    Hypertension    Past Surgical History:  Procedure Laterality Date   COLONOSCOPY     COLONOSCOPY  04/21/2012   Procedure: COLONOSCOPY;  Surgeon: Claudis RAYMOND Rivet, MD;  Location: [facility];  Service: Endoscopy;  Laterality: N/A;  1200   COLONOSCOPY N/A 09/09/2017   Procedure: COLONOSCOPY;  Surgeon: Rivet Claudis RAYMOND, MD;  Location: [facility];  Service: Endoscopy;  Laterality: N/A;  1200-rescheduled to 3/14/ @12 :00pm per Jenkins   HERNIA REPAIR     POLYPECTOMY  09/09/2017   Procedure: POLYPECTOMY;  Surgeon: Rivet Claudis RAYMOND, MD;  Location: [facility];  Service: Endoscopy;;  Transverse Colon (CB)   Social History   Tobacco Use   Smoking status: Former    Current packs/day: 0.50    Average packs/day: 0.5 packs/day for 20.0 years (10.0 ttl pk-yrs)    Types: Cigarettes   Smokeless tobacco:  Never  Vaping Use   Vaping status: Never Used  Substance Use Topics   Alcohol  use: Yes    Alcohol /week: 4.0 standard drinks of alcohol     Types: 4 Standard drinks or equivalent per week   Drug use: No   Family History  Problem Relation Age of Onset   Hypertension Mother    Hypertension Father    Alcoholism Father    Obesity Father    Diabetes Sister    Hypertension Sister    Healthy Daughter    Healthy Son    Colon cancer Neg Hx    Allergies  Allergen Reactions   Lisinopril     cough   Review of Systems  Constitutional:  Negative for chills and fever.  HENT:  Negative for sore throat.   Respiratory:  Negative for cough and shortness of breath.   Cardiovascular:  Negative for chest pain, palpitations and leg swelling.  Gastrointestinal:  Negative for abdominal pain, blood in stool, constipation, diarrhea, nausea and vomiting.  Genitourinary:  Negative for dysuria and hematuria.  Musculoskeletal:  Negative for myalgias.  Skin:  Negative for itching and rash.  Neurological:  Negative for dizziness and headaches.  Psychiatric/Behavioral:  Negative for depression and suicidal ideas.      Objective:     BP (!) 172/69 (BP Location: Left Arm, Patient Position: Sitting, Cuff Size: Large)   Pulse 72   Ht 5' 8 (1.727 m)   Wt  245 lb 3.2 oz (111.2 kg)   SpO2 91%   BMI 37.28 kg/m  BP Readings from Last 3 Encounters:  08/06/23 (!) 172/69  08/05/23 (!) 156/91  07/20/23 130/80   Physical Exam Vitals reviewed.  Constitutional:      General: He is not in acute distress.    Appearance: Normal appearance. He is obese. He is not ill-appearing.  HENT:     Head: Normocephalic and atraumatic.     Right Ear: External ear normal.     Left Ear: External ear normal.     Nose: Nose normal. No congestion or rhinorrhea.     Mouth/Throat:     Mouth: Mucous membranes are moist.     Pharynx: Oropharynx is clear.  Eyes:     General: No scleral icterus.    Extraocular Movements:  Extraocular movements intact.     Conjunctiva/sclera: Conjunctivae normal.     Pupils: Pupils are equal, round, and reactive to light.  Cardiovascular:     Rate and Rhythm: Normal rate and regular rhythm.     Pulses: Normal pulses.     Heart sounds: Normal heart sounds. No murmur heard. Pulmonary:     Effort: Pulmonary effort is normal.     Breath sounds: Normal breath sounds. No wheezing, rhonchi or rales.  Abdominal:     General: Abdomen is flat. Bowel sounds are normal. There is no distension.     Palpations: Abdomen is soft.     Tenderness: There is no abdominal tenderness.  Musculoskeletal:        General: No swelling or deformity. Normal range of motion.     Cervical back: Normal range of motion.  Skin:    General: Skin is warm and dry.     Capillary Refill: Capillary refill takes less than 2 seconds.  Neurological:     General: No focal deficit present.     Mental Status: He is alert and oriented to person, place, and time.     Motor: No weakness.  Psychiatric:        Mood and Affect: Mood normal.        Behavior: Behavior normal.        Thought Content: Thought content normal.   Last CBC Lab Results  Component Value Date   WBC 7.5 07/23/2022   HGB 12.5 (L) 07/23/2022   HCT 39.0 07/23/2022   MCV 74 (L) 07/23/2022   MCH 23.8 (L) 07/23/2022   RDW 18.3 (H) 07/23/2022   PLT 218 07/23/2022   Last metabolic panel Lab Results  Component Value Date   GLUCOSE 93 07/16/2023   NA 142 07/16/2023   K 3.9 07/16/2023   CL 105 07/16/2023   CO2 22 07/16/2023   BUN 18 07/16/2023   CREATININE 1.20 07/16/2023   EGFR 63 07/16/2023   CALCIUM  9.1 07/16/2023   PROT 6.5 07/16/2023   ALBUMIN 4.0 07/16/2023   LABGLOB 2.5 07/16/2023   AGRATIO 1.8 07/23/2022   BILITOT 0.4 07/16/2023   ALKPHOS 88 07/16/2023   AST 18 07/16/2023   ALT 12 07/16/2023   ANIONGAP 11 08/21/2020   Last lipids Lab Results  Component Value Date   CHOL 151 07/16/2023   HDL 46 07/16/2023   LDLCALC 91  07/16/2023   TRIG 73 07/16/2023   CHOLHDL 3.3 07/16/2023   Last hemoglobin A1c Lab Results  Component Value Date   HGBA1C 9.3 (A) 07/20/2023   Last thyroid functions Lab Results  Component Value Date   TSH 2.080 07/16/2023  T3TOTAL 98 03/04/2021   Last vitamin D  Lab Results  Component Value Date   VD25OH 59.0 05/12/2023   Last vitamin B12 and Folate Lab Results  Component Value Date   VITAMINB12 422 07/23/2022   FOLATE 16.9 05/25/2016   The 10-year ASCVD risk score (Arnett DK, et al., 2019) is: 55.8%    Assessment & Plan:   Problem List Items Addressed This Visit       Essential hypertension   His acute concern today is poorly controlled hypertension.  BP is elevated today at 172/69 and 167/82.  He is currently prescribed antihypertensive regimen includes losartan  100 mg daily, metoprolol  tartrate 100 mg twice daily, hydralazine  50 mg 3 times daily, and chlorthalidone  25 mg daily.  He has previously been well-controlled on this regimen, but reports today that he has not recently taken chlorthalidone  as he thought he was supposed to stop after lower extremity edema resolved.  He is also concerned about continuing metoprolol  tartrate due to obesogenic properties. -Resume chlorthalidone  25 mg daily.  Continue losartan  and hydralazine  as currently prescribed.  Through shared decision making, he will stop metoprolol  given concern for obesogenic properties.  Can consider addition of calcium  channel blocker if needed for improved HTN control. -Nurse visit in 2 weeks for BP check and repeat CMP.      Uncontrolled type 2 diabetes mellitus with hyperglycemia (HCC)   Followed by endocrinology.  A1c 9.3 on labs from last month.  He has recently started Ozempic .      Mixed hyperlipidemia - Primary   Lipid panel recently updated.  Total cholesterol 151 and LDL 91.  He is currently prescribed simvastatin  20 mg daily.  His 10-year ASCVD risk is 55.8%. -Discontinue simvastatin  in favor  of atorvastatin  40 mg daily. -Repeat CMP in 2 weeks -Repeat lipid panel at follow-up in 3 months      Class 2 severe obesity with serious comorbidity and body mass index (BMI) of 37.0 to 37.9 in adult (HCC)   BMI 37.2.  Current weight 245 pounds.  He has lost 5 pounds since his last appointment with me.  Closely followed by healthy weight and wellness.  He has also recently started Ozempic .      Return in about 3 months (around 11/03/2023).   Manus FORBES Fireman, MD

## 2023-08-06 NOTE — Assessment & Plan Note (Signed)
 Lipid panel recently updated.  Total cholesterol 151 and LDL 91.  He is currently prescribed simvastatin  20 mg daily.  His 10-year ASCVD risk is 55.8%. -Discontinue simvastatin  in favor of atorvastatin  40 mg daily. -Repeat CMP in 2 weeks -Repeat lipid panel at follow-up in 3 months

## 2023-08-17 NOTE — Telephone Encounter (Signed)
 Pt picked up.

## 2023-09-01 ENCOUNTER — Encounter (HOSPITAL_COMMUNITY): Payer: Self-pay

## 2023-09-01 ENCOUNTER — Emergency Department (HOSPITAL_COMMUNITY)

## 2023-09-01 ENCOUNTER — Other Ambulatory Visit: Payer: Self-pay

## 2023-09-01 ENCOUNTER — Inpatient Hospital Stay (HOSPITAL_COMMUNITY)
Admission: EM | Admit: 2023-09-01 | Discharge: 2023-09-03 | DRG: 313 | Disposition: A | Discharge status: Home / Self Care | Attending: Internal Medicine | Admitting: Internal Medicine

## 2023-09-01 DIAGNOSIS — I2510 Atherosclerotic heart disease of native coronary artery without angina pectoris: Secondary | ICD-10-CM | POA: Diagnosis present

## 2023-09-01 DIAGNOSIS — I440 Atrioventricular block, first degree: Secondary | ICD-10-CM | POA: Diagnosis present

## 2023-09-01 DIAGNOSIS — Z79899 Other long term (current) drug therapy: Secondary | ICD-10-CM

## 2023-09-01 DIAGNOSIS — E66812 Obesity, class 2: Secondary | ICD-10-CM | POA: Diagnosis present

## 2023-09-01 DIAGNOSIS — K760 Fatty (change of) liver, not elsewhere classified: Secondary | ICD-10-CM | POA: Diagnosis present

## 2023-09-01 DIAGNOSIS — Z811 Family history of alcohol abuse and dependence: Secondary | ICD-10-CM | POA: Diagnosis not present

## 2023-09-01 DIAGNOSIS — Z8249 Family history of ischemic heart disease and other diseases of the circulatory system: Secondary | ICD-10-CM

## 2023-09-01 DIAGNOSIS — E1165 Type 2 diabetes mellitus with hyperglycemia: Secondary | ICD-10-CM | POA: Diagnosis present

## 2023-09-01 DIAGNOSIS — I10 Essential (primary) hypertension: Secondary | ICD-10-CM | POA: Diagnosis present

## 2023-09-01 DIAGNOSIS — Z87891 Personal history of nicotine dependence: Secondary | ICD-10-CM

## 2023-09-01 DIAGNOSIS — Z860100 Personal history of colon polyps, unspecified: Secondary | ICD-10-CM | POA: Diagnosis not present

## 2023-09-01 DIAGNOSIS — Z6837 Body mass index (BMI) 37.0-37.9, adult: Secondary | ICD-10-CM

## 2023-09-01 DIAGNOSIS — Z888 Allergy status to other drugs, medicaments and biological substances status: Secondary | ICD-10-CM | POA: Diagnosis not present

## 2023-09-01 DIAGNOSIS — R0789 Other chest pain: Principal | ICD-10-CM | POA: Diagnosis present

## 2023-09-01 DIAGNOSIS — Z79890 Hormone replacement therapy (postmenopausal): Secondary | ICD-10-CM

## 2023-09-01 DIAGNOSIS — E782 Mixed hyperlipidemia: Secondary | ICD-10-CM

## 2023-09-01 DIAGNOSIS — E876 Hypokalemia: Secondary | ICD-10-CM | POA: Diagnosis present

## 2023-09-01 DIAGNOSIS — D509 Iron deficiency anemia, unspecified: Secondary | ICD-10-CM | POA: Diagnosis present

## 2023-09-01 DIAGNOSIS — Z833 Family history of diabetes mellitus: Secondary | ICD-10-CM | POA: Diagnosis not present

## 2023-09-01 DIAGNOSIS — Z7982 Long term (current) use of aspirin: Secondary | ICD-10-CM

## 2023-09-01 DIAGNOSIS — Z7984 Long term (current) use of oral hypoglycemic drugs: Secondary | ICD-10-CM | POA: Diagnosis not present

## 2023-09-01 DIAGNOSIS — I214 Non-ST elevation (NSTEMI) myocardial infarction: Principal | ICD-10-CM

## 2023-09-01 DIAGNOSIS — E039 Hypothyroidism, unspecified: Secondary | ICD-10-CM

## 2023-09-01 DIAGNOSIS — Z794 Long term (current) use of insulin: Secondary | ICD-10-CM

## 2023-09-01 HISTORY — DX: Heart failure, unspecified: I50.9

## 2023-09-01 LAB — CBC WITH DIFFERENTIAL/PLATELET
Abs Immature Granulocytes: 0.03 10*3/uL (ref 0.00–0.07)
Basophils Absolute: 0.1 10*3/uL (ref 0.0–0.1)
Basophils Relative: 1 %
Eosinophils Absolute: 0.2 10*3/uL (ref 0.0–0.5)
Eosinophils Relative: 2 %
HCT: 41.8 % (ref 39.0–52.0)
Hemoglobin: 12.9 g/dL — ABNORMAL LOW (ref 13.0–17.0)
Immature Granulocytes: 0 %
Lymphocytes Relative: 40 %
Lymphs Abs: 3.1 10*3/uL (ref 0.7–4.0)
MCH: 22.8 pg — ABNORMAL LOW (ref 26.0–34.0)
MCHC: 30.9 g/dL (ref 30.0–36.0)
MCV: 73.7 fL — ABNORMAL LOW (ref 80.0–100.0)
Monocytes Absolute: 0.6 10*3/uL (ref 0.1–1.0)
Monocytes Relative: 8 %
Neutro Abs: 3.7 10*3/uL (ref 1.7–7.7)
Neutrophils Relative %: 49 %
Platelets: 221 10*3/uL (ref 150–400)
RBC: 5.67 MIL/uL (ref 4.22–5.81)
RDW: 16.5 % — ABNORMAL HIGH (ref 11.5–15.5)
WBC: 7.6 10*3/uL (ref 4.0–10.5)
nRBC: 0 % (ref 0.0–0.2)

## 2023-09-01 LAB — BASIC METABOLIC PANEL
Anion gap: 9 (ref 5–15)
BUN: 18 mg/dL (ref 8–23)
CO2: 29 mmol/L (ref 22–32)
Calcium: 9.4 mg/dL (ref 8.9–10.3)
Chloride: 99 mmol/L (ref 98–111)
Creatinine, Ser: 1.22 mg/dL (ref 0.61–1.24)
GFR, Estimated: >60 mL/min (ref >60)
Glucose, Bld: 92 mg/dL (ref 70–99)
Potassium: 3.7 mmol/L (ref 3.5–5.1)
Sodium: 137 mmol/L (ref 135–145)

## 2023-09-01 LAB — APTT: aPTT: 30 s (ref 24–36)

## 2023-09-01 LAB — TROPONIN I (HIGH SENSITIVITY)
Troponin I (High Sensitivity): 104 ng/L (ref <18)
Troponin I (High Sensitivity): 94 ng/L — ABNORMAL HIGH (ref <18)

## 2023-09-01 LAB — PROTIME-INR
INR: 0.9 (ref 0.8–1.2)
Prothrombin Time: 12.8 s (ref 11.4–15.2)

## 2023-09-01 MED ORDER — HEPARIN (PORCINE) 25000 UT/250ML-% IV SOLN
1150.0000 [IU]/h | INTRAVENOUS | Status: DC
  Administered 2023-09-01: 1150 [IU]/h via INTRAVENOUS
  Filled 2023-09-01: qty 250

## 2023-09-01 MED ORDER — HYDRALAZINE HCL 25 MG PO TABS
50.0000 mg | ORAL_TABLET | Freq: Three times a day (TID) | ORAL | Status: DC
  Administered 2023-09-01 – 2023-09-03 (×5): 50 mg via ORAL
  Filled 2023-09-01 (×5): qty 2

## 2023-09-01 MED ORDER — ONDANSETRON HCL 4 MG/2ML IJ SOLN
4.0000 mg | Freq: Four times a day (QID) | INTRAMUSCULAR | Status: DC | PRN
  Administered 2023-09-02: 4 mg via INTRAVENOUS
  Filled 2023-09-01: qty 2

## 2023-09-01 MED ORDER — LOSARTAN POTASSIUM 50 MG PO TABS
100.0000 mg | ORAL_TABLET | Freq: Every day | ORAL | Status: DC
  Administered 2023-09-02 – 2023-09-03 (×2): 100 mg via ORAL
  Filled 2023-09-01: qty 2
  Filled 2023-09-01: qty 4

## 2023-09-01 MED ORDER — ACETAMINOPHEN 325 MG PO TABS: 650.0000 mg | ORAL_TABLET | Freq: Four times a day (QID) | ORAL | Status: DC | PRN

## 2023-09-01 MED ORDER — ASPIRIN 325 MG PO TABS
325.0000 mg | ORAL_TABLET | Freq: Once | ORAL | Status: AC
  Administered 2023-09-01: 325 mg via ORAL
  Filled 2023-09-01: qty 1

## 2023-09-01 MED ORDER — ONDANSETRON HCL 4 MG PO TABS: 4.0000 mg | ORAL_TABLET | Freq: Four times a day (QID) | ORAL | Status: DC | PRN

## 2023-09-01 MED ORDER — ATORVASTATIN CALCIUM 40 MG PO TABS
40.0000 mg | ORAL_TABLET | Freq: Every day | ORAL | Status: DC
  Administered 2023-09-01 – 2023-09-02 (×2): 40 mg via ORAL
  Filled 2023-09-01 (×2): qty 1

## 2023-09-01 MED ORDER — ACETAMINOPHEN 650 MG RE SUPP: 650.0000 mg | Freq: Four times a day (QID) | RECTAL | Status: DC | PRN

## 2023-09-01 MED ORDER — HEPARIN BOLUS VIA INFUSION
4000.0000 [IU] | Freq: Once | INTRAVENOUS | Status: AC
  Administered 2023-09-01: 4000 [IU] via INTRAVENOUS

## 2023-09-01 MED ORDER — LEVOTHYROXINE SODIUM 50 MCG PO TABS: 75.0000 ug | ORAL_TABLET | Freq: Every day | ORAL | Status: DC

## 2023-09-01 MED ORDER — ATORVASTATIN CALCIUM 40 MG PO TABS: 40.0000 mg | ORAL_TABLET | Freq: Every day | ORAL | Status: DC

## 2023-09-01 MED ORDER — LOSARTAN POTASSIUM 100 MG PO TABS: 100.0000 mg | ORAL_TABLET | Freq: Every day | ORAL | 0 refills | Status: DC

## 2023-09-01 MED ORDER — LEVOTHYROXINE SODIUM 75 MCG PO TABS
75.0000 ug | ORAL_TABLET | Freq: Every day | ORAL | Status: DC
Start: 2023-09-02 — End: 2023-09-03
  Administered 2023-09-02 – 2023-09-03 (×2): 75 ug via ORAL
  Filled 2023-09-01: qty 2
  Filled 2023-09-01: qty 1

## 2023-09-01 MED ORDER — INSULIN ASPART 100 UNIT/ML IJ SOLN
0.0000 [IU] | Freq: Three times a day (TID) | INTRAMUSCULAR | Status: DC
  Administered 2023-09-02: 3 [IU] via SUBCUTANEOUS
  Administered 2023-09-02: 2 [IU] via SUBCUTANEOUS
  Administered 2023-09-03: 3 [IU] via SUBCUTANEOUS
  Filled 2023-09-01: qty 1

## 2023-09-01 NOTE — ED Provider Triage Note (Signed)
 Emergency Medicine Provider Triage Evaluation Note  Gerald Evans , a 78 y.o. male  was evaluated in triage.  Pt complains of right-sided shoulder pain and chest pain.  Review of Systems  Positive: Right-sided shoulder pain and chest pain Negative: Dyspnea, abdominal pain, nausea, vomiting, diarrhea  Physical Exam  BP 134/74   Pulse 60   Temp 98.7 F (37.1 C) (Oral)   Resp 18   Ht 5\' 8"  (1.727 m)   Wt 112 kg   SpO2 97%   BMI 37.54 kg/m  Gen:   Awake, no distress   Resp:  Normal effort  MSK:   Moves extremities without difficulty, nontender palpation over right shoulder, full range of motion noted, 2+ peripheral pulses in bilateral upper extremities Other:    Medical Decision Making  Medically screening exam initiated at 6:05 PM.  Appropriate orders placed.  Gerald Evans was informed that the remainder of the evaluation will be completed by another provider, this initial triage assessment does not replace that evaluation, and the importance of remaining in the ED until their evaluation is complete.  Patient does remain stable at this time.  Labs and imaging have been ordered.  Awaiting bed in the back at this time.   Lelon Perla, PA-C 09/01/23 1805

## 2023-09-01 NOTE — H&P (Signed)
 History and Physical    Patient: Gerald Evans UJW:[id number] DOB: 07-23-1945 DOA: 09/01/2023 DOS: the patient was seen and examined on 09/01/2023 PCP: Billie Lade, MD  Patient coming from: Home  Chief Complaint:  Chief Complaint  Patient presents with   Shoulder Pain   HPI: Gerald Evans is a 78 y.o. male with medical history significant of hypertension, hyperlipidemia, T2DM, hypothyroidism who presents to the emergency department due to chest pain.  Patient complained of right-sided chest pain with radiation to right shoulder which occurred while descending the stairs at home, it was a 13 step stairs, he was on the 11th stair when the pain started, pain was sharp in nature and was rated as 10/10 on pain scale.  The pain self resolved after resting for about 5 to 6 minutes.  He denies nausea, diaphoresis.  Patient states that he had similar presentation about 6 months ago which only lasted about 2 minutes.  He decided to come to the ED for further evaluation of the chest pain.  ED Course:  In the emergency department, he was hemodynamically stable, though eventually became tachypneic and bradycardic with HR of 58 bpm.  BP on arrival was 134/74, O2 sat was 87% on room air and temperature was 98.82F.  Workup in the ED showed microcytic anemia, BMP was normal, troponin was 104 > 94. Chest x-ray showed no active cardiopulmonary disease Aspirin 325 mg x 1 was given.  Cardiologist on-call (Dr. Lendell Caprice) was consulted and recommended starting patient on heparin drip and admitting patient to [facility] with plan for cardiology team to consult on patient on arrival to [facility]. Patient was started on heparin drip.  Hospitalist was asked to admit patient for further evaluation and management.  Review of Systems: Review of systems as noted in the HPI. All other systems reviewed and are negative.   Past Medical History:  Diagnosis Date   CHF (congestive heart failure) (HCC)    Colon polyps     Diabetes mellitus without complication (HCC)    Edema of both lower extremities    Fatty liver    Hypercholesteremia    Hypertension    Past Surgical History:  Procedure Laterality Date   COLONOSCOPY     COLONOSCOPY  04/21/2012   Procedure: COLONOSCOPY;  Surgeon: Malissa Hippo, MD;  Location: [facility];  Service: Endoscopy;  Laterality: N/A;  1200   COLONOSCOPY N/A 09/09/2017   Procedure: COLONOSCOPY;  Surgeon: Malissa Hippo, MD;  Location: [facility];  Service: Endoscopy;  Laterality: N/A;  1200-rescheduled to 3/14/ @12 :00pm per Dewayne Hatch   HERNIA REPAIR     POLYPECTOMY  09/09/2017   Procedure: POLYPECTOMY;  Surgeon: Malissa Hippo, MD;  Location: [facility];  Service: Endoscopy;;  Transverse Colon (CB)    Social History:  reports that he has quit smoking. His smoking use included cigarettes. He has a 10 pack-year smoking history. He has never used smokeless tobacco. He reports current alcohol use of about 4.0 standard drinks of alcohol per week. He reports that he does not use drugs.   Allergies  Allergen Reactions   Lisinopril Cough    Family History  Problem Relation Age of Onset   Hypertension Mother    Hypertension Father    Alcoholism Father    Obesity Father    Diabetes Sister    Hypertension Sister    Healthy Daughter    Healthy Son    Colon cancer Neg Hx  Prior to Admission medications   Medication Sig Start Date End Date Taking? Authorizing Provider  atorvastatin (LIPITOR) 40 MG tablet Take 1 tablet (40 mg total) by mouth daily. 08/06/23  Yes Billie Lade, MD  blood glucose meter kit and supplies Dispense based on patient and insurance preference. Use up to four times daily as directed. (FOR ICD-10 E11.65) 11/29/19   [name], MD  chlorthalidone (HYGROTON) 25 MG tablet Take 1 tablet (25 mg total) by mouth daily. 08/06/23  Yes Billie Lade, MD  glucose blood (ONETOUCH ULTRA) test strip USE 1 STRIP TO CHECK GLUCOSE THREE TIMES DAILY  AS DIRECTED E11.65 03/11/23   [name], MD  hydrALAZINE (APRESOLINE) 50 MG tablet TAKE 1 TABLET BY MOUTH THREE TIMES DAILY 06/28/23  Yes Billie Lade, MD  insulin aspart protamine - aspart (NOVOLOG 70/30 FLEXPEN) (70-30) 100 UNIT/ML FlexPen Inject 40 Units into the skin 2 (two) times daily with a meal. 03/22/23  Yes Reardon, Alphonzo Lemmings J, NP  Insulin Pen Needle (BD PEN NEEDLE NANO 2ND GEN) 32G X 4 MM MISC 1 Package by Does not apply route 2 (two) times daily. 05/06/22   Langston Reusing, MD  levothyroxine (SYNTHROID) 75 MCG tablet Take 1 tablet (75 mcg total) by mouth daily before breakfast. 07/20/23  Yes [name], NP  losartan (COZAAR) 100 MG tablet Take 1 tablet (100 mg total) by mouth daily. 09/01/23  Yes Billie Lade, MD  metFORMIN (GLUCOPHAGE) 1000 MG tablet Take 1 tablet (1,000 mg total) by mouth daily with breakfast. 07/20/23  Yes Reardon, Freddi Starr, NP  OneTouch Delica Lancets 30G MISC 1 each by Does not apply route 3 (three) times daily. Use to check blood glucose three times daily 12/09/20   [name], MD  sildenafil (VIAGRA) 100 MG tablet Take 1/2 tab in combination with 10mg  of tadalafil. 02/18/23  Yes Bjorn Pippin, MD  tadalafil (CIALIS) 20 MG tablet Use either 1 tab po daily prn or 1/2 tab po with 1/2 tab sildenafil 100mg  po daily prn 08/20/22  Yes Bjorn Pippin, MD  Vitamin D, Ergocalciferol, (DRISDOL) 1.25 MG (50000 UNIT) CAPS capsule Take 1 capsule (50,000 Units total) by mouth every 7 (seven) days. 07/15/23  Yes [name], DO    Physical Exam: BP 134/74   Pulse 60   Temp 98.7 F (37.1 C) (Oral)   Resp 18   Ht 5\' 8"  (1.727 m)   Wt 112 kg   SpO2 97%   BMI 37.54 kg/m   General: 78 y.o. year-old male well developed well nourished in no acute distress.  Alert and oriented x3. HEENT: NCAT, EOMI Neck: Supple, trachea medial Cardiovascular: Regular rate and rhythm with no rubs or gallops.  No thyromegaly or JVD noted.  No lower extremity  edema. 2/4 pulses in all 4 extremities. Respiratory: Clear to auscultation with no wheezes or rales. Good inspiratory effort. Abdomen: Soft, nontender nondistended with normal bowel sounds x4 quadrants. Muskuloskeletal: No cyanosis, clubbing or edema noted bilaterally Neuro: CN II-XII intact, strength 5/5 x 4, sensation, reflexes intact Skin: No ulcerative lesions noted or rashes Psychiatry: Judgement and insight appear normal. Mood is appropriate for condition and setting          Labs on Admission:  Basic Metabolic Panel: Recent Labs  Lab 09/01/23 1739  NA 137  K 3.7  CL 99  CO2 29  GLUCOSE 92  BUN 18  CREATININE 1.22  CALCIUM 9.4   Liver Function Tests: No  results for input(s): "AST", "ALT", "ALKPHOS", "BILITOT", "PROT", "ALBUMIN" in the last 168 hours. No results for input(s): "LIPASE", "AMYLASE" in the last 168 hours. No results for input(s): "AMMONIA" in the last 168 hours. CBC: Recent Labs  Lab 09/01/23 1739  WBC 7.6  NEUTROABS 3.7  HGB 12.9*  HCT 41.8  MCV 73.7*  PLT 221   Cardiac Enzymes: No results for input(s): "CKTOTAL", "CKMB", "CKMBINDEX", "TROPONINI" in the last 168 hours.  BNP (last 3 results) No results for input(s): "BNP" in the last 8760 hours.  ProBNP (last 3 results) No results for input(s): "PROBNP" in the last 8760 hours.  CBG: No results for input(s): "GLUCAP" in the last 168 hours.  Radiological Exams on Admission: DG Chest 2 View Result Date: 09/01/2023 CLINICAL DATA:  Right shoulder pain. EXAM: CHEST - 2 VIEW COMPARISON:  June 25, 2022 FINDINGS: The cardiac silhouette is mildly enlarged and unchanged in size. There is marked severity calcification of the aortic arch. Both lungs are clear. The visualized skeletal structures are unremarkable. IMPRESSION: No active cardiopulmonary disease. Electronically Signed   By: Aram Candela M.D.   On: 09/01/2023 19:48    EKG: I independently viewed the EKG done and my findings are as  followed: Sinus bradycardia at a rate of 56 bpm with first-degree AV block  Assessment/Plan Present on Admission:  NSTEMI (non-ST elevated myocardial infarction) (HCC)  Essential hypertension  Mixed hyperlipidemia  Acquired hypothyroidism  Principal Problem:   NSTEMI (non-ST elevated myocardial infarction) (HCC) Active Problems:   Mixed hyperlipidemia   Essential hypertension   Uncontrolled type 2 diabetes mellitus with hyperglycemia, with long-term current use of insulin (HCC)   Class 2 severe obesity with serious comorbidity and body mass index (BMI) of 37.0 to 37.9 in adult [facility])   Acquired hypothyroidism   Microcytic anemia  NSTEMI Cardiovascular risk factors include hypertension, hyperlipidemia, T2DM,obesity Continue telemetry  Troponins x2 -104 > 94 EKG showed Sinus bradycardia at a rate of 56 bpm with first-degree AV block Patient was started on heparin drip Cardiology was consulted and recommended admitting patient to [facility] so that cardiology team can consult on patient on arrival.  Microcytic anemia MCV 73.7, hemoglobin 12.9 Iron studies will be checked  Essential hypertension Continue losartan, hydralazine  Mixed hyperlipidemia Continue Lipitor  Acquired hypothyroidism Continue Synthroid  Type 2 diabetes mellitus Hemoglobin A1c on 07/19/2023 was 9.3 Continue ISS and hypoglycemia protocol  Class II obesity (BMI 37.54) Diet and lifestyle modification   DVT prophylaxis: Heparin drip  Code Status: Full code  Family Communication: Wife and daughter at bedside (all questions answered to satisfaction)  Consults: Cardiology (Dr. Lendell Caprice) by [facility] EDP  Severity of Illness: The appropriate patient status for this patient is INPATIENT. Inpatient status is judged to be reasonable and necessary in order to provide the required intensity of service to ensure the patient's safety. The patient's presenting symptoms, physical exam findings, and initial radiographic  and laboratory data in the context of their chronic comorbidities is felt to place them at high risk for further clinical deterioration. Furthermore, it is not anticipated that the patient will be medically stable for discharge from the hospital within 2 midnights of admission.   * I certify that at the point of admission it is my clinical judgment that the patient will require inpatient hospital care spanning beyond 2 midnights from the point of admission due to high intensity of service, high risk for further deterioration and high frequency of surveillance required.*  Author: [name],  DO 09/01/2023 11:03 PM  For on call review [email or web address].

## 2023-09-01 NOTE — Consult Note (Addendum)
 PHARMACY - ANTICOAGULATION CONSULT NOTE  Pharmacy Consult for heparin infusion Indication: chest pain/ACS  Allergies  Allergen Reactions   Lisinopril     cough    Patient Measurements: Height: 5\' 8"  (172.7 cm) Weight: 112 kg (246 lb 14.6 oz) IBW/kg (Calculated) : 68.4 Heparin Dosing Weight: 93.4  Vital Signs: Temp: 98.7 F (37.1 C) (03/05 1635) Temp Source: Oral (03/05 1635) BP: 134/74 (03/05 1635) Pulse Rate: 60 (03/05 1635)  Labs: Recent Labs    09/01/23 1739  HGB 12.9*  HCT 41.8  PLT 221  CREATININE 1.22  TROPONINIHS 104*    Estimated Creatinine Clearance: 62.5 mL/min (by C-G formula based on SCr of 1.22 mg/dL).   Medical History: Past Medical History:  Diagnosis Date   CHF (congestive heart failure) (HCC)    Colon polyps    Diabetes mellitus without complication (HCC)    Edema of both lower extremities    Fatty liver    Hypercholesteremia    Hypertension     Medications:  No home anticoagulants per pharmacist review  Assessment: 78 yo male presented to ED with shoulder pain.  PMH includes DM, HTN, and HLD.  Troponin found to be elevated.  Pharmacy consulted for initiation of heparin infusion.  Baseline PT/INR and aPTT ordered.  Goal of Therapy:  Heparin level 0.3-0.7 units/ml Monitor platelets by anticoagulation protocol: Yes   Plan:  Give 4000 units bolus x 1 Start heparin infusion at 1150 units/hr Check anti-Xa level in 8 hours and daily while on heparin Continue to monitor H&H and platelets  Barrie Folk, PharmD 09/01/2023,8:20 PM

## 2023-09-01 NOTE — ED Triage Notes (Signed)
 Pt arrived via POV c/o right shoulder pain that began apprx 90 minutes PTA. Pt reports pain began while he was walking downstairs. Pt denies injury. Pt reports this happened once before apprx 1 yr ago.

## 2023-09-01 NOTE — ED Provider Notes (Addendum)
 [place] EMERGENCY DEPARTMENT AT [facility] Provider Note   CSN: [id number] Arrival date & time: 09/01/23  1556     History  Chief Complaint  Patient presents with   Shoulder Pain    Gerald Evans is a 78 y.o. male.  78 year old male with past medical history of diabetes, hypertension, and hyperlipidemia presenting to the emergency department today with pain in his right chest and shoulder.  The patient states this began when he was walking down the stairs.  He states that this lasted for 5 or 6 minutes.  Reports that it has resolved.  He denies any associated nausea or diaphoresis.  He states that this did improve when he sat down to rest.  He denies any recent injuries.  He denies any recent fevers, chills, or cough.  He came to the ER today for further evaluation regarding this due to his comorbidities when the symptoms were lasting more than a minute or 2.  He denies any chest pain or arm pain at this time.   Shoulder Pain      Home Medications Prior to Admission medications   Medication Sig Start Date End Date Taking? Authorizing Provider  atorvastatin (LIPITOR) 40 MG tablet Take 1 tablet (40 mg total) by mouth daily. 08/06/23  Yes Billie Lade, MD  blood glucose meter kit and supplies Dispense based on patient and insurance preference. Use up to four times daily as directed. (FOR ICD-10 E11.65) 11/29/19   [name], MD  chlorthalidone (HYGROTON) 25 MG tablet Take 1 tablet (25 mg total) by mouth daily. 08/06/23  Yes Billie Lade, MD  glucose blood (ONETOUCH ULTRA) test strip USE 1 STRIP TO CHECK GLUCOSE THREE TIMES DAILY AS DIRECTED E11.65 03/11/23   [name], MD  hydrALAZINE (APRESOLINE) 50 MG tablet TAKE 1 TABLET BY MOUTH THREE TIMES DAILY 06/28/23  Yes Billie Lade, MD  insulin aspart protamine - aspart (NOVOLOG 70/30 FLEXPEN) (70-30) 100 UNIT/ML FlexPen Inject 40 Units into the skin 2 (two) times daily with a meal. 03/22/23  Yes  Reardon, Alphonzo Lemmings J, NP  Insulin Pen Needle (BD PEN NEEDLE NANO 2ND GEN) 32G X 4 MM MISC 1 Package by Does not apply route 2 (two) times daily. 05/06/22   Langston Reusing, MD  levothyroxine (SYNTHROID) 75 MCG tablet Take 1 tablet (75 mcg total) by mouth daily before breakfast. 07/20/23  Yes [name], NP  losartan (COZAAR) 100 MG tablet Take 1 tablet (100 mg total) by mouth daily. 09/01/23  Yes Billie Lade, MD  metFORMIN (GLUCOPHAGE) 1000 MG tablet Take 1 tablet (1,000 mg total) by mouth daily with breakfast. 07/20/23  Yes Reardon, Freddi Starr, NP  OneTouch Delica Lancets 30G MISC 1 each by Does not apply route 3 (three) times daily. Use to check blood glucose three times daily 12/09/20   [name], MD  sildenafil (VIAGRA) 100 MG tablet Take 1/2 tab in combination with 10mg  of tadalafil. 02/18/23  Yes Bjorn Pippin, MD  tadalafil (CIALIS) 20 MG tablet Use either 1 tab po daily prn or 1/2 tab po with 1/2 tab sildenafil 100mg  po daily prn 08/20/22  Yes Bjorn Pippin, MD  Vitamin D, Ergocalciferol, (DRISDOL) 1.25 MG (50000 UNIT) CAPS capsule Take 1 capsule (50,000 Units total) by mouth every 7 (seven) days. 07/15/23  Yes Opalski, Deborah, DO      Allergies    Lisinopril    Review of Systems   Review of Systems  Cardiovascular:        Right-sided chest pain  All other systems reviewed and are negative.   Physical Exam Updated Vital Signs BP 134/74   Pulse 60   Temp 98.7 F (37.1 C) (Oral)   Resp 18   Ht 5\' 8"  (1.727 m)   Wt 112 kg   SpO2 97%   BMI 37.54 kg/m  Physical Exam Vitals and nursing note reviewed.   Gen: NAD Eyes: PERRL, EOMI HEENT: no oropharyngeal swelling Neck: trachea midline Resp: clear to auscultation bilaterally Card: RRR, no murmurs, rubs, or gallops Abd: nontender, nondistended Extremities: no calf tenderness, no edema Vascular: 2+ radial pulses bilaterally, 2+ DP pulses bilaterally Neuro: No focal deficits Skin: no rashes Psyc: acting  appropriately   ED Results / Procedures / Treatments   Labs (all labs ordered are listed, but only abnormal results are displayed) Labs Reviewed  CBC WITH DIFFERENTIAL/PLATELET - Abnormal; Notable for the following components:      Result Value   Hemoglobin 12.9 (*)    MCV 73.7 (*)    MCH 22.8 (*)    RDW 16.5 (*)    All other components within normal limits  TROPONIN I (HIGH SENSITIVITY) - Abnormal; Notable for the following components:   Troponin I (High Sensitivity) 104 (*)    All other components within normal limits  TROPONIN I (HIGH SENSITIVITY) - Abnormal; Notable for the following components:   Troponin I (High Sensitivity) 94 (*)    All other components within normal limits  BASIC METABOLIC PANEL  APTT  PROTIME-INR  CBC  HEPARIN LEVEL (UNFRACTIONATED)    EKG EKG Interpretation Date/Time:  Wednesday September 01 2023 17:30:37 EST Ventricular Rate:  56 PR Interval:  214 QRS Duration:  100 QT Interval:  438 QTC Calculation: 422 R Axis:   -50  Text Interpretation: Sinus bradycardia with 1st degree A-V block Left axis deviation Anterior infarct , age undetermined Abnormal ECG When compared with ECG of 01-Sep-2023 16:38, No significant change was found Confirmed by Beckey Downing [phone]) on 09/01/2023 5:44:54 PM  Radiology DG Chest 2 View Result Date: 09/01/2023 CLINICAL DATA:  Right shoulder pain. EXAM: CHEST - 2 VIEW COMPARISON:  June 25, 2022 FINDINGS: The cardiac silhouette is mildly enlarged and unchanged in size. There is marked severity calcification of the aortic arch. Both lungs are clear. The visualized skeletal structures are unremarkable. IMPRESSION: No active cardiopulmonary disease. Electronically Signed   By: Aram Candela M.D.   On: 09/01/2023 19:48    Procedures Procedures    Medications Ordered in ED Medications  heparin ADULT infusion 100 units/mL (25000 units/215mL) (1,150 Units/hr Intravenous New Bag/Given 09/01/23 2043)  aspirin tablet 325 mg  (325 mg Oral Given 09/01/23 2037)  heparin bolus via infusion 4,000 Units (4,000 Units Intravenous Bolus from Bag 09/01/23 2043)    ED Course/ Medical Decision Making/ A&P                                 Medical Decision Making 78 year old male with past medical history of diabetes, hypertension, and hyperlipidemia presenting to the emergency department today with atypical right-sided chest pain.  The patient's EKG interpreted by me does show some J-point elevation in the anterior leads but is only about 1 mm.  The patient is chest pain-free currently.  Will further evaluate him here with a troponin and labs to further evaluate for ACS.  Also obtain a chest x-ray to  evaluate for pulmonary infiltrates or pneumothorax.  His EKG does appear relatively similar to his previous EKG.  The patient's troponin here is elevated.  Calls placed to cardiology to discuss.  I discussed the patient's case with Dr. Lendell Caprice from cardiology.  Recommends treating with heparin and admission to [facility] for likely catheterization.  A call is placed to hospitalist service for admission.  CRITICAL CARE Performed by: Durwin Glaze   Total critical care time: 38 minutes  Critical care time was exclusive of separately billable procedures and treating other patients.  Critical care was necessary to treat or prevent imminent or life-threatening deterioration.  Critical care was time spent personally by me on the following activities: development of treatment plan with patient and/or surrogate as well as nursing, discussions with consultants, evaluation of patient's response to treatment, examination of patient, obtaining history from patient or surrogate, ordering and performing treatments and interventions, ordering and review of laboratory studies, ordering and review of radiographic studies, pulse oximetry and re-evaluation of patient's condition.   Amount and/or Complexity of Data Reviewed Labs: ordered. Radiology:  ordered.  Risk OTC drugs. Prescription drug management. Decision regarding hospitalization.           Final Clinical Impression(s) / ED Diagnoses Final diagnoses:  NSTEMI (non-ST elevated myocardial infarction) [facility])    Rx / DC Orders ED Discharge Orders     None         Durwin Glaze, MD 09/01/23 2104    Durwin Glaze, MD 09/01/23 2105

## 2023-09-02 ENCOUNTER — Inpatient Hospital Stay (HOSPITAL_COMMUNITY)

## 2023-09-02 ENCOUNTER — Ambulatory Visit (INDEPENDENT_AMBULATORY_CARE_PROVIDER_SITE_OTHER): Payer: Medicare HMO | Admitting: Family Medicine

## 2023-09-02 ENCOUNTER — Other Ambulatory Visit (HOSPITAL_COMMUNITY): Payer: Self-pay | Admitting: *Deleted

## 2023-09-02 DIAGNOSIS — I214 Non-ST elevation (NSTEMI) myocardial infarction: Secondary | ICD-10-CM

## 2023-09-02 DIAGNOSIS — E876 Hypokalemia: Secondary | ICD-10-CM

## 2023-09-02 DIAGNOSIS — R0789 Other chest pain: Principal | ICD-10-CM

## 2023-09-02 LAB — CBC
HCT: 36.4 % — ABNORMAL LOW (ref 39.0–52.0)
Hemoglobin: 11.6 g/dL — ABNORMAL LOW (ref 13.0–17.0)
MCH: 23.2 pg — ABNORMAL LOW (ref 26.0–34.0)
MCHC: 31.9 g/dL (ref 30.0–36.0)
MCV: 72.8 fL — ABNORMAL LOW (ref 80.0–100.0)
Platelets: 177 10*3/uL (ref 150–400)
RBC: 5 MIL/uL (ref 4.22–5.81)
RDW: 15.9 % — ABNORMAL HIGH (ref 11.5–15.5)
WBC: 7.7 10*3/uL (ref 4.0–10.5)
nRBC: 0 % (ref 0.0–0.2)

## 2023-09-02 LAB — IRON AND TIBC
Iron: 92 ug/dL (ref 45–182)
Saturation Ratios: 34 % (ref 17.9–39.5)
TIBC: 275 ug/dL (ref 250–450)
UIBC: 183 ug/dL

## 2023-09-02 LAB — ECHOCARDIOGRAM COMPLETE
AR max vel: 2.25 cm2
AV Area VTI: 2.29 cm2
AV Area mean vel: 2.07 cm2
AV Mean grad: 4 mmHg
AV Peak grad: 6.9 mmHg
Ao pk vel: 1.31 m/s
Area-P 1/2: 3.37 cm2
Height: 68 in
MV VTI: 2.09 cm2
S' Lateral: 3.5 cm
Single Plane A4C EF: 65.1 %
Weight: 3940.8 [oz_av]

## 2023-09-02 LAB — COMPREHENSIVE METABOLIC PANEL
ALT: 19 U/L (ref 0–44)
AST: 16 U/L (ref 15–41)
Albumin: 3.3 g/dL — ABNORMAL LOW (ref 3.5–5.0)
Alkaline Phosphatase: 67 U/L (ref 38–126)
Anion gap: 11 (ref 5–15)
BUN: 16 mg/dL (ref 8–23)
CO2: 24 mmol/L (ref 22–32)
Calcium: 8.6 mg/dL — ABNORMAL LOW (ref 8.9–10.3)
Chloride: 102 mmol/L (ref 98–111)
Creatinine, Ser: 1.21 mg/dL (ref 0.61–1.24)
GFR, Estimated: >60 mL/min (ref >60)
Glucose, Bld: 168 mg/dL — ABNORMAL HIGH (ref 70–99)
Potassium: 2.7 mmol/L — CL (ref 3.5–5.1)
Sodium: 137 mmol/L (ref 135–145)
Total Bilirubin: 0.7 mg/dL (ref 0.0–1.2)
Total Protein: 6.3 g/dL — ABNORMAL LOW (ref 6.5–8.1)

## 2023-09-02 LAB — BASIC METABOLIC PANEL
Anion gap: 9 (ref 5–15)
BUN: 16 mg/dL (ref 8–23)
CO2: 24 mmol/L (ref 22–32)
Calcium: 8.9 mg/dL (ref 8.9–10.3)
Chloride: 99 mmol/L (ref 98–111)
Creatinine, Ser: 1.22 mg/dL (ref 0.61–1.24)
GFR, Estimated: >60 mL/min (ref >60)
Glucose, Bld: 239 mg/dL — ABNORMAL HIGH (ref 70–99)
Potassium: 3.9 mmol/L (ref 3.5–5.1)
Sodium: 132 mmol/L — ABNORMAL LOW (ref 135–145)

## 2023-09-02 LAB — GLUCOSE, CAPILLARY
Glucose-Capillary: 160 mg/dL — ABNORMAL HIGH (ref 70–99)
Glucose-Capillary: 211 mg/dL — ABNORMAL HIGH (ref 70–99)

## 2023-09-02 LAB — HEPARIN LEVEL (UNFRACTIONATED): Heparin Unfractionated: 0.36 [IU]/mL (ref 0.30–0.70)

## 2023-09-02 LAB — MAGNESIUM: Magnesium: 1.5 mg/dL — ABNORMAL LOW (ref 1.7–2.4)

## 2023-09-02 LAB — CBG MONITORING, ED
Glucose-Capillary: 172 mg/dL — ABNORMAL HIGH (ref 70–99)
Glucose-Capillary: 144 mg/dL — ABNORMAL HIGH (ref 70–99)

## 2023-09-02 LAB — FERRITIN: Ferritin: 100 ng/mL (ref 24–336)

## 2023-09-02 LAB — TROPONIN I (HIGH SENSITIVITY): Troponin I (High Sensitivity): 80 ng/L — ABNORMAL HIGH (ref <18)

## 2023-09-02 LAB — PHOSPHORUS: Phosphorus: 3.7 mg/dL (ref 2.5–4.6)

## 2023-09-02 LAB — D-DIMER, QUANTITATIVE: D-Dimer, Quant: 0.43 ug{FEU}/mL (ref 0.00–0.50)

## 2023-09-02 MED ORDER — ASPIRIN 81 MG PO TBEC
81.0000 mg | DELAYED_RELEASE_TABLET | Freq: Every day | ORAL | Status: DC
Start: 2023-09-02 — End: 2023-09-03
  Administered 2023-09-02 – 2023-09-03 (×2): 81 mg via ORAL
  Filled 2023-09-02 (×2): qty 1

## 2023-09-02 MED ORDER — PERFLUTREN LIPID MICROSPHERE
1.0000 mL | INTRAVENOUS | Status: AC | PRN
  Administered 2023-09-02: 2 mL via INTRAVENOUS

## 2023-09-02 MED ORDER — MAGNESIUM SULFATE 4 GM/100ML IV SOLN
4.0000 g | Freq: Once | INTRAVENOUS | Status: AC
  Administered 2023-09-02: 4 g via INTRAVENOUS
  Filled 2023-09-02: qty 100

## 2023-09-02 MED ORDER — POTASSIUM CHLORIDE CRYS ER 20 MEQ PO TBCR
40.0000 meq | EXTENDED_RELEASE_TABLET | ORAL | Status: AC
  Administered 2023-09-02 (×2): 40 meq via ORAL
  Filled 2023-09-02 (×2): qty 2

## 2023-09-02 NOTE — ED Notes (Signed)
 ED TO INPATIENT HANDOFF REPORT  ED Nurse Name and Phone #: Beverley Fiedler Name/Age/Gender Gerald Evans 78 y.o. male Room/Bed: APA16A/APA16A  Code Status   Code Status: Full Code  Home/SNF/Other Home Patient oriented to: self, place, time, and situation Is this baseline? Yes   Triage Complete: Triage complete  Chief Complaint NSTEMI (non-ST elevated myocardial infarction) [facility]) [I21.4]  Triage Note Pt arrived via POV c/o right shoulder pain that began apprx 90 minutes PTA. Pt reports pain began while he was walking downstairs. Pt denies injury. Pt reports this happened once before apprx 1 yr ago.    Allergies Allergies  Allergen Reactions   Lisinopril Cough    Level of Care/Admitting Diagnosis ED Disposition     ED Disposition  Admit   Condition  --   Comment  Hospital Area: [facility] [[id number]]  Level of Care: Telemetry [5]  Covid Evaluation: Asymptomatic - no recent exposure (last 10 days) testing not required  Diagnosis: NSTEMI (non-ST elevated myocardial infarction) [facility]) [[id number]]  Admitting Physician: [name] [[id number]]  Attending Physician: [name] [[id number]]  Certification:: I certify this patient will need inpatient services for at least 2 midnights  Expected Medical Readiness: 09/04/2023          B Medical/Surgery History Past Medical History:  Diagnosis Date   CHF (congestive heart failure) (HCC)    Colon polyps    Diabetes mellitus without complication (HCC)    Edema of both lower extremities    Fatty liver    Hypercholesteremia    Hypertension    Past Surgical History:  Procedure Laterality Date   COLONOSCOPY     COLONOSCOPY  04/21/2012   Procedure: COLONOSCOPY;  Surgeon: Malissa Hippo, MD;  [facility];  Service: Endoscopy;  Laterality: N/A;  1200   COLONOSCOPY N/A 09/09/2017   Procedure: COLONOSCOPY;  Surgeon: Malissa Hippo, MD;  [facility];  Service: Endoscopy;  Laterality:  N/A;  1200-rescheduled to 3/14/ @12 :00pm per Dewayne Hatch   HERNIA REPAIR     POLYPECTOMY  09/09/2017   Procedure: POLYPECTOMY;  Surgeon: Malissa Hippo, MD;  Location[facility];  Service: Endoscopy;;  Transverse Colon (CB)     A IV Location/Drains/Wounds Patient Lines/Drains/Airways Status     Active Line/Drains/Airways     Name Placement date Placement time Site Days   Peripheral IV 09/01/23 20 G 1" Anterior;Distal;Right Antecubital 09/01/23  2008  Antecubital  1            Intake/Output Last 24 hours  Intake/Output Summary (Last 24 hours) at 09/02/2023 1230 Last data filed at 09/02/2023 1143 Gross per 24 hour  Intake 296 ml  Output --  Net 296 ml    Labs/Imaging Results for orders placed or performed during the hospital encounter of 09/01/23 (from the past 48 hours)  APTT     Status: None   Collection Time: 09/01/23  5:37 PM  Result Value Ref Range   aPTT 30 24 - 36 seconds    Comment: Performed at [facility], [address] [place], [place] [address]  Protime-INR     Status: None   Collection Time: 09/01/23  5:37 PM  Result Value Ref Range   Prothrombin Time 12.8 11.4 - 15.2 seconds   INR 0.9 0.8 - 1.2    Comment: (NOTE) INR goal varies based on device and disease states. Performed at [facility], [address], [place], [place] [address]   Basic metabolic panel  Status: None   Collection Time: 09/01/23  5:39 PM  Result Value Ref Range   Sodium 137 135 - 145 mmol/L   Potassium 3.7 3.5 - 5.1 mmol/L   Chloride 99 98 - 111 mmol/L   CO2 29 22 - 32 mmol/L   Glucose, Bld 92 70 - 99 mg/dL    Comment: Glucose reference range applies only to samples taken after fasting for at least 8 hours.   BUN 18 8 - 23 mg/dL   Creatinine, Ser 0.86 0.61 - 1.24 mg/dL   Calcium 9.4 8.9 - 57.8 mg/dL   GFR, Estimated >46 >96 mL/min    Comment: (NOTE) Calculated using the CKD-EPI Creatinine Equation (2021)    Anion gap 9 5 - 15    Comment: Performed at [facility], [address], [place], [place] [address]  Troponin I (High Sensitivity)     Status: Abnormal   Collection Time: 09/01/23  5:39 PM  Result Value Ref Range   Troponin I (High Sensitivity) 104 (HH) <18 ng/L    Comment: CRITICAL RESULT CALLED TO, READ BACK BY AND VERIFIED WITH D FOWLER RN [phone] K FORSYTH (NOTE) Elevated high sensitivity troponin I (hsTnI) values and significant  changes across serial measurements may suggest ACS but many other  chronic and acute conditions are known to elevate hsTnI results.  Refer to the "Links" section for chest pain algorithms and additional  guidance. Performed at [facility], [address], [place], [place] [address]   CBC with Differential     Status: Abnormal   Collection Time: 09/01/23  5:39 PM  Result Value Ref Range   WBC 7.6 4.0 - 10.5 K/uL   RBC 5.67 4.22 - 5.81 MIL/uL   Hemoglobin 12.9 (L) 13.0 - 17.0 g/dL   HCT 64.4 03.4 - 74.2 %   MCV 73.7 (L) 80.0 - 100.0 fL   MCH 22.8 (L) 26.0 - 34.0 pg   MCHC 30.9 30.0 - 36.0 g/dL   RDW 59.5 (H) 63.8 - 75.6 %   Platelets 221 150 - 400 K/uL   nRBC 0.0 0.0 - 0.2 %   Neutrophils Relative % 49 %   Neutro Abs 3.7 1.7 - 7.7 K/uL   Lymphocytes Relative 40 %   Lymphs Abs 3.1 0.7 - 4.0 K/uL   Monocytes Relative 8 %   Monocytes Absolute 0.6 0.1 - 1.0 K/uL   Eosinophils Relative 2 %   Eosinophils Absolute 0.2 0.0 - 0.5 K/uL   Basophils Relative 1 %   Basophils Absolute 0.1 0.0 - 0.1 K/uL   Immature Granulocytes 0 %   Abs Immature Granulocytes 0.03 0.00 - 0.07 K/uL    Comment: Performed at [facility], [address], [place], [place] [address]  Troponin I (High Sensitivity)     Status: Abnormal   Collection Time: 09/01/23  8:07 PM  Result Value Ref Range   Troponin I (High Sensitivity) 94 (H) <18 ng/L    Comment: (NOTE) Elevated high sensitivity troponin I (hsTnI) values and significant  changes across serial measurements may suggest ACS but many other  chronic and acute conditions are known to elevate  hsTnI results.  Refer to the "Links" section for chest pain algorithms and additional  guidance. Performed at [facility], [address] [place], [place] [address]   CBG monitoring, ED     Status: Abnormal   Collection Time: 09/02/23  5:19 AM  Result Value Ref Range   Glucose-Capillary 172 (H) 70 - 99 mg/dL  Comment: Glucose reference range applies only to samples taken after fasting for at least 8 hours.  Heparin level (unfractionated)     Status: None   Collection Time: 09/02/23  5:31 AM  Result Value Ref Range   Heparin Unfractionated 0.36 0.30 - 0.70 IU/mL    Comment: (NOTE) The clinical reportable range upper limit is being lowered to >1.10 to align with the FDA approved guidance for the current laboratory assay.  If heparin results are below expected values, and patient dosage has  been confirmed, suggest follow up testing of antithrombin III levels. Performed at [facility], [address] [place], [place] [address]   CBC     Status: Abnormal   Collection Time: 09/02/23  5:43 AM  Result Value Ref Range   WBC 7.7 4.0 - 10.5 K/uL   RBC 5.00 4.22 - 5.81 MIL/uL   Hemoglobin 11.6 (L) 13.0 - 17.0 g/dL   HCT 60.4 (L) 54.0 - 98.1 %   MCV 72.8 (L) 80.0 - 100.0 fL   MCH 23.2 (L) 26.0 - 34.0 pg   MCHC 31.9 30.0 - 36.0 g/dL   RDW 19.1 (H) 47.8 - 29.5 %   Platelets 177 150 - 400 K/uL   nRBC 0.0 0.0 - 0.2 %    Comment: Performed at [facility], [address] [place], [place] [address]  Ferritin     Status: None   Collection Time: 09/02/23  5:43 AM  Result Value Ref Range   Ferritin 100 24 - 336 ng/mL    Comment: Performed at [facility], [address], [place], [place] [address]  Iron and TIBC     Status: None   Collection Time: 09/02/23  5:43 AM  Result Value Ref Range   Iron 92 45 - 182 ug/dL   TIBC 469 629 - 528 ug/dL   Saturation Ratios 34 17.9 - 39.5 %   UIBC 183 ug/dL    Comment: Performed at [facility], [address] [place], [place] [address]   Comprehensive metabolic panel     Status: Abnormal   Collection Time: 09/02/23  5:43 AM  Result Value Ref Range   Sodium 137 135 - 145 mmol/L   Potassium 2.7 (LL) 3.5 - 5.1 mmol/L    Comment: CRITICAL RESULT CALLED TO, READ BACK BY AND VERIFIED WITH KENNON,J AT 6:45AM ON 09/02/23 BY FESTERMAN,C DELTA CHECK NOTED    Chloride 102 98 - 111 mmol/L   CO2 24 22 - 32 mmol/L   Glucose, Bld 168 (H) 70 - 99 mg/dL    Comment: Glucose reference range applies only to samples taken after fasting for at least 8 hours.   BUN 16 8 - 23 mg/dL   Creatinine, Ser 4.01 0.61 - 1.24 mg/dL   Calcium 8.6 (L) 8.9 - 10.3 mg/dL   Total Protein 6.3 (L) 6.5 - 8.1 g/dL   Albumin 3.3 (L) 3.5 - 5.0 g/dL   AST 16 15 - 41 U/L   ALT 19 0 - 44 U/L   Alkaline Phosphatase 67 38 - 126 U/L   Total Bilirubin 0.7 0.0 - 1.2 mg/dL   GFR, Estimated >02 >72 mL/min    Comment: (NOTE) Calculated using the CKD-EPI Creatinine Equation (2021)    Anion gap 11 5 - 15    Comment: Performed at [facility], [address], [place], [place] [address]  Magnesium     Status: Abnormal   Collection Time: 09/02/23  5:43 AM  Result Value Ref Range  Magnesium 1.5 (L) 1.7 - 2.4 mg/dL    Comment: Performed at [facility], [address] [place], [place] [address]  Phosphorus     Status: None   Collection Time: 09/02/23  5:43 AM  Result Value Ref Range   Phosphorus 3.7 2.5 - 4.6 mg/dL    Comment: Performed at [facility], [address], [place], [place] [address]  Troponin I (High Sensitivity)     Status: Abnormal   Collection Time: 09/02/23  5:43 AM  Result Value Ref Range   Troponin I (High Sensitivity) 80 (H) <18 ng/L    Comment: (NOTE) Elevated high sensitivity troponin I (hsTnI) values and significant  changes across serial measurements may suggest ACS but many other  chronic and acute conditions are known to elevate hsTnI results.  Refer to the "Links" section for chest pain algorithms and additional  guidance. Performed at  [facility], [address] [place], [place] [address]   CBG monitoring, ED     Status: Abnormal   Collection Time: 09/02/23  8:08 AM  Result Value Ref Range   Glucose-Capillary 144 (H) 70 - 99 mg/dL    Comment: Glucose reference range applies only to samples taken after fasting for at least 8 hours.  D-dimer, quantitative     Status: None   Collection Time: 09/02/23 10:18 AM  Result Value Ref Range   D-Dimer, Quant 0.43 0.00 - 0.50 ug/mL-FEU    Comment: (NOTE) At the manufacturer cut-off value of 0.5 g/mL FEU, this assay has a negative predictive value of 95-100%.This assay is intended for use in conjunction with a clinical pretest probability (PTP) assessment model to exclude pulmonary embolism (PE) and deep venous thrombosis (DVT) in outpatients suspected of PE or DVT. Results should be correlated with clinical presentation. Performed at [facility], [address] [place], [place] [address]    DG Chest 2 View Result Date: 09/01/2023 CLINICAL DATA:  Right shoulder pain. EXAM: CHEST - 2 VIEW COMPARISON:  June 25, 2022 FINDINGS: The cardiac silhouette is mildly enlarged and unchanged in size. There is marked severity calcification of the aortic arch. Both lungs are clear. The visualized skeletal structures are unremarkable. IMPRESSION: No active cardiopulmonary disease. Electronically Signed   By: Aram Candela M.D.   On: 09/01/2023 19:48    Pending Labs Unresulted Labs (From admission, onward)     Start     Ordered   09/03/23 0500  Lipid panel  Tomorrow morning,   R        09/02/23 0915   09/03/23 0500  CBC  (Procedure Panel)  Tomorrow morning,   R       Placed in "And" Linked Group   09/02/23 1046   09/03/23 0500  Basic metabolic panel  (Procedure Panel)  Tomorrow morning,   R       Placed in "And" Linked Group   09/02/23 1046   09/03/23 0500  Magnesium  (Procedure Panel)  Tomorrow morning,   R       Placed in "And" Linked Group   09/02/23 1046   09/02/23 1400   Basic metabolic panel  Once,   R       Comments: Can check with next Heparin level.    09/02/23 0903            Vitals/Pain Today's Vitals   09/02/23 0900 09/02/23 1000 09/02/23 1100 09/02/23 1146  BP:    122/62  Pulse: (!) 57 (!) 56 (!) 59 65  Resp: Marland Kitchen)  21 17 15 18   Temp:      TempSrc:      SpO2: 94% 94% 95% 93%  Weight:      Height:      PainSc:    1     Isolation Precautions No active isolations  Medications Medications  acetaminophen (TYLENOL) tablet 650 mg (has no administration in time range)    Or  acetaminophen (TYLENOL) suppository 650 mg (has no administration in time range)  ondansetron (ZOFRAN) tablet 4 mg ( Oral See Alternative 09/02/23 0511)    Or  ondansetron (ZOFRAN) injection 4 mg (4 mg Intravenous Given 09/02/23 0511)  hydrALAZINE (APRESOLINE) tablet 50 mg (50 mg Oral Given 09/02/23 1020)  losartan (COZAAR) tablet 100 mg (100 mg Oral Given 09/02/23 1018)  insulin aspart (novoLOG) injection 0-15 Units (2 Units Subcutaneous Given 09/02/23 0819)  atorvastatin (LIPITOR) tablet 40 mg (40 mg Oral Given 09/01/23 2346)  levothyroxine (SYNTHROID) tablet 75 mcg (75 mcg Oral Given 09/02/23 0624)  aspirin EC tablet 81 mg (81 mg Oral Given 09/02/23 1020)  aspirin tablet 325 mg (325 mg Oral Given 09/01/23 2037)  heparin bolus via infusion 4,000 Units (4,000 Units Intravenous Bolus from Bag 09/01/23 2043)  potassium chloride SA (KLOR-CON M) CR tablet 40 mEq (40 mEq Oral Given 09/02/23 1019)  magnesium sulfate IVPB 4 g 100 mL (0 g Intravenous Stopped 09/02/23 0904)    Mobility walks       R Recommendations: See Admitting Provider Note  Report given to:

## 2023-09-02 NOTE — Consult Note (Addendum)
 Cardiology Consultation   Patient ID: Gerald Evans MRN: [id number]; DOB: July 19, 1945  Admit date: 09/01/2023 Date of Consult: 09/02/2023  PCP:  Billie Lade, MD   [facility] Providers Cardiologist:  Dr. Janett Billow  Patient Profile:   Gerald Evans is a 78 y.o. male with a hx of CAD (minimal nonobstructive CAD by cardiac catheterization 2012, coronary CTA in 05/2023 showing mild nonobstructive CAD), HTN, HLD, hypothyroidism and Type 2 DM who is being seen 09/02/2023 for the evaluation of NSTEMI at the request of Dr. Thomes Dinning.  History of Present Illness:   Gerald Evans reports he is usually active at baseline and has been going to the gym at least 3 to 4 days a week and walks on the treadmill without any chest pain or dyspnea on exertion. Was doing this as recently as last week. Starting yesterday, he developed a right sided chest discomfort while walking down steps and reports this was a sharp pain which lasted for several minutes. Believes it radiated into his neck. This prompted him to seek evaluation and he was pain-free upon arrival to the ED. Reports having intermittent episodes of this type of pain since he was a teenager but yesterday's episode was more significant which prompted him to seek evaluation. He denies any recent palpitations, orthopnea, PND or pitting edema. Reports he did have an episode of significant weakness in the setting of vomiting and diarrhea this morning which is new. Denies any recent sick contacts. No recent GI illness.  Initial labs showed WBC 7.6, Hgb 12.9, platelets 221, Na+ 137, K+ 3.7 and creatinine 1.22. Initial and repeat Hs troponin values elevated at 104, 94 and 80. CXR with no active cardiopulmonary disease. EKG shows sinus bradycardia with first-degree AV block, heart rate 59 with LAD and isolated TWI along Lead III.   Received 325mg  ASA and IV Heparin. Atorvastatin 40mg  daily also ordered. Repeat BMP today shows creatinine is stable at  1.21 but K+ low at 2.7. Supplementation has been ordered.  Magnesium also low at 1.5 receiving supplementation for this.   Past Medical History:  Diagnosis Date   CHF (congestive heart failure) (HCC)    Colon polyps    Diabetes mellitus without complication (HCC)    Edema of both lower extremities    Fatty liver    Hypercholesteremia    Hypertension     Past Surgical History:  Procedure Laterality Date   COLONOSCOPY     COLONOSCOPY  04/21/2012   Procedure: COLONOSCOPY;  Surgeon: Malissa Hippo, MD;  [facility];  Service: Endoscopy;  Laterality: N/A;  1200   COLONOSCOPY N/A 09/09/2017   Procedure: COLONOSCOPY;  Surgeon: Malissa Hippo, MD;  Location: [facility];  Service: Endoscopy;  Laterality: N/A;  1200-rescheduled to 3/14/ @12 :00pm per Dewayne Hatch   HERNIA REPAIR     POLYPECTOMY  09/09/2017   Procedure: POLYPECTOMY;  Surgeon: Malissa Hippo, MD;  Location: [facility];  Service: Endoscopy;;  Transverse Colon (CB)     Home Medications:  Prior to Admission medications   Medication Sig Start Date End Date Taking? Authorizing Provider  atorvastatin (LIPITOR) 40 MG tablet Take 1 tablet (40 mg total) by mouth daily. 08/06/23  Yes Billie Lade, MD  chlorthalidone (HYGROTON) 25 MG tablet Take 1 tablet (25 mg total) by mouth daily. 08/06/23  Yes Billie Lade, MD  hydrALAZINE (APRESOLINE) 50 MG tablet TAKE 1 TABLET BY MOUTH THREE TIMES DAILY 06/28/23  Yes Dixon,  Lucina Mellow, MD  insulin aspart protamine - aspart (NOVOLOG 70/30 FLEXPEN) (70-30) 100 UNIT/ML FlexPen Inject 40 Units into the skin 2 (two) times daily with a meal. 03/22/23  Yes [name], NP  levothyroxine (SYNTHROID) 75 MCG tablet Take 1 tablet (75 mcg total) by mouth daily before breakfast. 07/20/23  Yes [name], NP  losartan (COZAAR) 100 MG tablet Take 1 tablet (100 mg total) by mouth daily. 09/01/23  Yes Billie Lade, MD  metFORMIN (GLUCOPHAGE) 1000 MG tablet Take 1 tablet (1,000 mg total)  by mouth daily with breakfast. 07/20/23  Yes [name], NP  sildenafil (VIAGRA) 100 MG tablet Take 1/2 tab in combination with 10mg  of tadalafil. 02/18/23  Yes Bjorn Pippin, MD  tadalafil (CIALIS) 20 MG tablet Use either 1 tab po daily prn or 1/2 tab po with 1/2 tab sildenafil 100mg  po daily prn 08/20/22  Yes Bjorn Pippin, MD  Vitamin D, Ergocalciferol, (DRISDOL) 1.25 MG (50000 UNIT) CAPS capsule Take 1 capsule (50,000 Units total) by mouth every 7 (seven) days. 07/15/23  Yes Opalski, Deborah, DO  blood glucose meter kit and supplies Dispense based on patient and insurance preference. Use up to four times daily as directed. (FOR ICD-10 E11.65) 11/29/19   [name], MD  glucose blood (ONETOUCH ULTRA) test strip USE 1 STRIP TO CHECK GLUCOSE THREE TIMES DAILY AS DIRECTED E11.65 03/11/23   [name], MD  Insulin Pen Needle (BD PEN NEEDLE NANO 2ND GEN) 32G X 4 MM MISC 1 Package by Does not apply route 2 (two) times daily. 05/06/22   Langston Reusing, MD  OneTouch Delica Lancets 30G MISC 1 each by Does not apply route 3 (three) times daily. Use to check blood glucose three times daily 12/09/20   [name], MD    Inpatient Medications: Scheduled Meds:  atorvastatin  40 mg Oral QHS   hydrALAZINE  50 mg Oral TID   insulin aspart  0-15 Units Subcutaneous TID WC   levothyroxine  75 mcg Oral Q0600   losartan  100 mg Oral Daily   potassium chloride  40 mEq Oral Q4H   Continuous Infusions:  heparin 1,150 Units/hr (09/02/23 0554)   magnesium sulfate bolus IVPB 4 g (09/02/23 0704)   PRN Meds: acetaminophen **OR** acetaminophen, ondansetron **OR** ondansetron (ZOFRAN) IV  Allergies:    Allergies  Allergen Reactions   Lisinopril Cough    Social History:   Social History   Socioeconomic History   Marital status: Married    Spouse name: Not on file   Number of children: 2   Years of education: Not on file   Highest education level: Not on file  Occupational  History   Occupation: retired  Tobacco Use   Smoking status: Former    Current packs/day: 0.50    Average packs/day: 0.5 packs/day for 20.0 years (10.0 ttl pk-yrs)    Types: Cigarettes   Smokeless tobacco: Never  Vaping Use   Vaping status: Never Used  Substance and Sexual Activity   Alcohol use: Yes    Alcohol/week: 4.0 standard drinks of alcohol    Types: 4 Standard drinks or equivalent per week   Drug use: No   Sexual activity: Not on file  Other Topics Concern   Not on file  Social History Narrative   Not on file   Social Drivers of Health   Financial Resource Strain: Low Risk  (11/18/2022)   Overall Financial Resource Strain (CARDIA)  Difficulty of Paying Living Expenses: Not hard at all  Food Insecurity: No Food Insecurity (11/18/2022)   Hunger Vital Sign    Worried About Running Out of Food in the Last Year: Never true    Ran Out of Food in the Last Year: Never true  Transportation Needs: No Transportation Needs (11/18/2022)   PRAPARE - Administrator, Civil Service (Medical): No    Lack of Transportation (Non-Medical): No  Physical Activity: Sufficiently Active (11/18/2022)   Exercise Vital Sign    Days of Exercise per Week: 7 days    Minutes of Exercise per Session: 30 min  Stress: No Stress Concern Present (11/18/2022)   [employer] of Occupational Health - Occupational Stress Questionnaire    Feeling of Stress : Not at all  Social Connections: Socially Integrated (11/18/2022)   Social Connection and Isolation Panel [NHANES]    Frequency of Communication with Friends and Family: More than three times a week    Frequency of Social Gatherings with Friends and Family: More than three times a week    Attends Religious Services: More than 4 times per year    Active Member of [employer] or Organizations: Yes    Attends Engineer, structural: More than 4 times per year    Marital Status: Married  Catering manager Violence: Not At Risk (11/18/2022)    Humiliation, Afraid, Rape, and Kick questionnaire    Fear of Current or Ex-Partner: No    Emotionally Abused: No    Physically Abused: No    Sexually Abused: No    Family History:    Family History  Problem Relation Age of Onset   Hypertension Mother    Hypertension Father    Alcoholism Father    Obesity Father    Diabetes Sister    Hypertension Sister    Healthy Daughter    Healthy Son    Colon cancer Neg Hx      ROS:  Please see the history of present illness.   All other ROS reviewed and negative.     Physical Exam/Data:   Vitals:   09/02/23 0530 09/02/23 0553 09/02/23 0600 09/02/23 0630  BP: (!) 95/53  121/61 129/66  Pulse: (!) 55  (!) 53 60  Resp: (!) 26  (!) 22 (!) 21  Temp:  98.6 F (37 C)    TempSrc:  Oral    SpO2: 93%  98% 96%  Weight:      Height:       No intake or output data in the 24 hours ending 09/02/23 0831    09/01/2023    4:35 PM 08/06/2023    9:54 AM 08/05/2023   10:00 AM  Last 3 Weights  Weight (lbs) 246 lb 14.6 oz 245 lb 3.2 oz 240 lb  Weight (kg) 112 kg 111.222 kg 108.863 kg     Body mass index is 37.54 kg/m.  General:  Well nourished, well developed male appearing in no acute distress. HEENT: normal Neck: no JVD Vascular: No carotid bruits; Distal pulses 2+ bilaterally Cardiac:  normal S1, S2; RRR; no murmur  Lungs:  clear to auscultation bilaterally, no wheezing, rhonchi or rales  Abd: soft, nontender, no hepatomegaly  Ext: no pitting edema Musculoskeletal:  No deformities, BUE and BLE strength normal and equal Skin: warm and dry  Neuro:  CNs 2-12 intact, no focal abnormalities noted Psych:  Normal affect   EKG:  The EKG was personally reviewed and demonstrates: Sinus bradycardia with first-degree  AV block, heart rate 59 with LAD and isolated TWI along Lead III.  Telemetry:  Telemetry was personally reviewed and demonstrates:  Sinus bradycardia, HR in 50's to 60's.   Relevant CV Studies:  Echocardiogram: 02/2022 INTERPRETATION   NORMAL LEFT VENTRICULAR SYSTOLIC FUNCTION   WITH MILD LVH  NORMAL RIGHT VENTRICULAR SYSTOLIC FUNCTION  TRIVIAL REGURGITATION NOTED (See above)  NO VALVULAR STENOSIS  TRIVIAL MR, TR, PR  EF >55%   Coronary CTA: 05/2023 IMPRESSION: 1. Coronary calcium score calculated on 04/19/2023. Value of 643. This was 68th percentile for age and sex matched control.   2. Normal coronary origin with right dominance.   3. Mild proximal RCA stenosis (25-49%).   4. Minimal proximal Ramus disease (<25%).   5. CAD-RADS 2. Mild non-obstructive CAD (25-49%). Consider preventive therapy and risk factor modification.  Laboratory Data:  High Sensitivity Troponin:   Recent Labs  Lab 09/01/23 1739 09/01/23 2007 09/02/23 0543  TROPONINIHS 104* 94* 80*     Chemistry Recent Labs  Lab 09/01/23 1739 09/02/23 0543  NA 137 137  K 3.7 2.7*  CL 99 102  CO2 29 24  GLUCOSE 92 168*  BUN 18 16  CREATININE 1.22 1.21  CALCIUM 9.4 8.6*  MG  --  1.5*  GFRNONAA >60 >60  ANIONGAP 9 11    Recent Labs  Lab 09/02/23 0543  PROT 6.3*  ALBUMIN 3.3*  AST 16  ALT 19  ALKPHOS 67  BILITOT 0.7   Lipids No results for input(s): "CHOL", "TRIG", "HDL", "LABVLDL", "LDLCALC", "CHOLHDL" in the last 168 hours.  Hematology Recent Labs  Lab 09/01/23 1739 09/02/23 0543  WBC 7.6 7.7  RBC 5.67 5.00  HGB 12.9* 11.6*  HCT 41.8 36.4*  MCV 73.7* 72.8*  MCH 22.8* 23.2*  MCHC 30.9 31.9  RDW 16.5* 15.9*  PLT 221 177   Thyroid No results for input(s): "TSH", "FREET4" in the last 168 hours.  BNPNo results for input(s): "BNP", "PROBNP" in the last 168 hours.  DDimer No results for input(s): "DDIMER" in the last 168 hours.   Radiology/Studies:  DG Chest 2 View Result Date: 09/01/2023 CLINICAL DATA:  Right shoulder pain. EXAM: CHEST - 2 VIEW COMPARISON:  June 25, 2022 FINDINGS: The cardiac silhouette is mildly enlarged and unchanged in size. There is marked severity calcification of the aortic arch. Both lungs  are clear. The visualized skeletal structures are unremarkable. IMPRESSION: No active cardiopulmonary disease. Electronically Signed   By: Aram Candela M.D.   On: 09/01/2023 19:48     Assessment and Plan:   1. Atypical Chest Pain/Elevated Troponin Values - Presented with an episode of right sided chest pain which occurred with exertion and lasted for several minutes. Reports episodes in the past but this was more significant. Hs troponin values have been mildly elevated at 104, 94 and 80. Will order an echocardiogram to assess for any structural abnormalities. While the patient's pain is atypical, he is very concerned about progressive CAD as his brother recently underwent CABG. He does favor definitive evaluation with a possible repeat cardiac catheterization. The patient understands that risks include but are not limited to stroke (1 in 1000), death (1 in 1000), kidney failure [usually temporary] (1 in 500), bleeding (1 in 200), allergic reaction [possibly serious] (1 in 200). Will discuss further with Dr. Jenene Slicker as recent Coronary CTA 3 months prior showed mild nonobstructive CAD. Already reviewed with the cath lab and given their schedule for today, his procedure would likely be tomorrow if  indicated. This would also allow for electrolyte replacement given his hypokalemia. - Continue IV Heparin for now. Also remains on Atorvastatin 40 mg daily. Will order ASA 81 mg daily. No beta-blocker given baseline bradycardia.  2. CAD - He had minimal nonobstructive CAD by cardiac catheterization 2012 and Coronary CTA in 05/2023 showed mild nonobstructive CAD. Consider repeat ischemic evaluation as discussed above.  - Continue Atorvastatin 40mg  daily. Will order ASA 81mg  daily.   3. HTN - BP overall well-controlled, at 129/66 on most recent check. Continue Losartan 100mg  daily and Hydralazine 50mg  TID.   4. HLD - LDL was at 91 in 06/2023. Will recheck an FLP. If LDL remains above goal, would  titrate Atorvastatin from 40mg  daily to 80mg  daily or switch to Crestor 40mg  daily.   5. Diarrhea/Vomiting - Reports one occurrence overnight. No recent GI issues at home and denies any sick contacts. If recurrence, would check a GI Panel.   6. Hypokalemia/Hypomagnesemia - K+ 2.7 and Mg 1.5 this AM. Supplementation already ordered. Would recheck a BMET later this afternoon. Keep K+ ~ 4.0 and Mg ~ 2.0.   For questions or updates, please contact [facility] Please consult [email or web address] for contact info under    Signed, Ellsworth Lennox, PA-C  09/02/2023 8:31 AM

## 2023-09-02 NOTE — Progress Notes (Signed)
   09/02/23 1433  TOC Brief Assessment  Insurance and Status Reviewed  Patient has primary care physician Yes  Home environment has been reviewed Lives with spouse  Prior level of function: Independent  Prior/Current Home Services No current home services  Readmission risk has been reviewed Yes  Transition of care needs no transition of care needs at this time   TOC following possible transfer to [facility].

## 2023-09-02 NOTE — ED Notes (Signed)
 This RN ambulated the pt to the bathroom and when in the bathroom pt endorses that he is weak. I assisted him to sit on the toliet. Then the pt had BM x2 and pt endorsed he was too weak to stand up. This RN got a wheelchair and assisted the pt to the wheelchair. The pt started vomiting before being able to get out of the wheelchair in the room and stating" I am so weak." Pt had a BM in the wheelchair. This RN asked the Charge to contact the ED MD Bero to come and assess the pt along with contacting MD Adefeso. Pt was assisted back into the bed. Pt was diaphoretics, pale, and was speaking one word sentences. RN Demetrio Lapping obtained a 12 lead MD Bero assessed the 12 lead. Pt BP decreased with a MAP below 65. MD Bero order a bolus of NS 500 ml. MD Adefeso endorsed he was fine with the pts BP as long as the MAP was above 65.

## 2023-09-02 NOTE — Consult Note (Signed)
 PHARMACY - ANTICOAGULATION CONSULT NOTE  Pharmacy Consult for heparin infusion Indication: chest pain/ACS  Allergies  Allergen Reactions   Lisinopril Cough    Patient Measurements: Height: 5\' 8"  (172.7 cm) Weight: 112 kg (246 lb 14.6 oz) IBW/kg (Calculated) : 68.4 Heparin Dosing Weight: 93.4  Vital Signs: Temp: 98.6 F (37 C) (03/06 0553) Temp Source: Oral (03/06 0553) BP: 121/61 (03/06 0600) Pulse Rate: 53 (03/06 0600)  Labs: Recent Labs    09/01/23 1737 09/01/23 1739 09/01/23 2007 09/02/23 0531 09/02/23 0543  HGB  --  12.9*  --   --  11.6*  HCT  --  41.8  --   --  36.4*  PLT  --  221  --   --  177  APTT 30  --   --   --   --   LABPROT 12.8  --   --   --   --   INR 0.9  --   --   --   --   HEPARINUNFRC  --   --   --  0.36  --   CREATININE  --  1.22  --   --   --   TROPONINIHS  --  104* 94*  --   --     Estimated Creatinine Clearance: 62.5 mL/min (by C-G formula based on SCr of 1.22 mg/dL).   Medical History: Past Medical History:  Diagnosis Date   CHF (congestive heart failure) (HCC)    Colon polyps    Diabetes mellitus without complication (HCC)    Edema of both lower extremities    Fatty liver    Hypercholesteremia    Hypertension     Medications:  No home anticoagulants per pharmacist review  Assessment: 78 yo male presented to ED with shoulder pain.  PMH includes DM, HTN, and HLD.  Troponin found to be elevated.  Pharmacy consulted for initiation of heparin infusion.  Baseline PT/INR and aPTT ordered.  3/6 AM update:  Heparin level therapeutic   Goal of Therapy:  Heparin level 0.3-0.7 units/ml Monitor platelets by anticoagulation protocol: Yes   Plan:  Cont heparin 1150 units/hr Heparin level in 8 hours  Abran Duke, PharmD, BCPS Clinical Pharmacist Phone: [phone]

## 2023-09-02 NOTE — Progress Notes (Signed)
 PROGRESS NOTE    Gerald Evans  ZOX:[id number] DOB: 18-Aug-1945 DOA: 09/01/2023 PCP: Billie Lade, MD   Brief Narrative:    Gerald Evans is a 78 y.o. male with medical history significant of hypertension, hyperlipidemia, T2DM, hypothyroidism who presents to the emergency department due to chest pain.  He is noted to have atypical chest pain with elevated troponin and was started on heparin drip empirically.  Initial plans were for transfer to [facility], but he has not been seen by cardiology with plans to further manage while hospitalized at [facility].  Assessment & Plan:   Principal Problem:   NSTEMI (non-ST elevated myocardial infarction) (HCC) Active Problems:   Mixed hyperlipidemia   Essential hypertension   Uncontrolled type 2 diabetes mellitus with hyperglycemia, with long-term current use of insulin (HCC)   Class 2 severe obesity with serious comorbidity and body mass index (BMI) of 37.0 to 37.9 in adult [facility])   Acquired hypothyroidism   Microcytic anemia  Assessment and Plan:   Atypical chest pain with elevated troponin in the setting of CAD Appreciate cardiology recommendations with plans to keep at [facility] Continue IV heparin drip for now Atorvastatin 40 mg daily Aspirin 81 mg daily Avoidance of beta-blocker given bradycardia  Hypokalemia/hypomagnesemia Replete and reevaluate  Mild diarrhea/vomiting Plan to check GI panel   Microcytic anemia MCV 73.7, hemoglobin 12.9 Iron studies within normal limits Continue to monitor daily CBC   Essential hypertension Continue losartan, hydralazine   Mixed hyperlipidemia Continue Lipitor   Acquired hypothyroidism Continue Synthroid   Type 2 diabetes mellitus with hyperglycemia Hemoglobin A1c on 07/19/2023 was 9.3 Continue ISS and hypoglycemia protocol   Class II obesity (BMI 37.54) Diet and lifestyle modification    DVT prophylaxis: Heparin infusion Code Status: Full Family Communication: None at  bedside Disposition Plan:  Status is: Inpatient Remains inpatient appropriate because: Need for IV medications.   Consultants:  Cardiology  Procedures:  None  Antimicrobials:  None   Subjective: Patient seen and evaluated today with no new acute complaints or concerns. No acute concerns or events noted overnight.  Objective: Vitals:   09/02/23 0530 09/02/23 0553 09/02/23 0600 09/02/23 0630  BP: (!) 95/53  121/61 129/66  Pulse: (!) 55  (!) 53 60  Resp: (!) 26  (!) 22 (!) 21  Temp:  98.6 F (37 C)    TempSrc:  Oral    SpO2: 93%  98% 96%  Weight:      Height:        Intake/Output Summary (Last 24 hours) at 09/02/2023 1050 Last data filed at 09/02/2023 0904 Gross per 24 hour  Intake 99 ml  Output --  Net 99 ml   Filed Weights   09/01/23 1635  Weight: 112 kg    Examination:  General exam: Appears calm and comfortable  Respiratory system: Clear to auscultation. Respiratory effort normal. Cardiovascular system: S1 & S2 heard, RRR.  Gastrointestinal system: Abdomen is soft Central nervous system: Alert and awake Extremities: No edema Skin: No significant lesions noted Psychiatry: Flat affect.    Data Reviewed: I have personally reviewed following labs and imaging studies  CBC: Recent Labs  Lab 09/01/23 1739 09/02/23 0543  WBC 7.6 7.7  NEUTROABS 3.7  --   HGB 12.9* 11.6*  HCT 41.8 36.4*  MCV 73.7* 72.8*  PLT 221 177   Basic Metabolic Panel: Recent Labs  Lab 09/01/23 1739 09/02/23 0543  NA 137 137  K 3.7 2.7*  CL 99 102  CO2 29 24  GLUCOSE 92 168*  BUN 18 16  CREATININE 1.22 1.21  CALCIUM 9.4 8.6*  MG  --  1.5*  PHOS  --  3.7   GFR: Estimated Creatinine Clearance: 63 mL/min (by C-G formula based on SCr of 1.21 mg/dL). Liver Function Tests: Recent Labs  Lab 09/02/23 0543  AST 16  ALT 19  ALKPHOS 67  BILITOT 0.7  PROT 6.3*  ALBUMIN 3.3*   No results for input(s): "LIPASE", "AMYLASE" in the last 168 hours. No results for input(s):  "AMMONIA" in the last 168 hours. Coagulation Profile: Recent Labs  Lab 09/01/23 1737  INR 0.9   Cardiac Enzymes: No results for input(s): "CKTOTAL", "CKMB", "CKMBINDEX", "TROPONINI" in the last 168 hours. BNP (last 3 results) No results for input(s): "PROBNP" in the last 8760 hours. HbA1C: No results for input(s): "HGBA1C" in the last 72 hours. CBG: Recent Labs  Lab 09/02/23 0519 09/02/23 0808  GLUCAP 172* 144*   Lipid Profile: No results for input(s): "CHOL", "HDL", "LDLCALC", "TRIG", "CHOLHDL", "LDLDIRECT" in the last 72 hours. Thyroid Function Tests: No results for input(s): "TSH", "T4TOTAL", "FREET4", "T3FREE", "THYROIDAB" in the last 72 hours. Anemia Panel: Recent Labs    09/02/23 0543  FERRITIN 100  TIBC 275  IRON 92   Sepsis Labs: No results for input(s): "PROCALCITON", "LATICACIDVEN" in the last 168 hours.  No results found for this or any previous visit (from the past 240 hours).       Radiology Studies: DG Chest 2 View Result Date: 09/01/2023 CLINICAL DATA:  Right shoulder pain. EXAM: CHEST - 2 VIEW COMPARISON:  June 25, 2022 FINDINGS: The cardiac silhouette is mildly enlarged and unchanged in size. There is marked severity calcification of the aortic arch. Both lungs are clear. The visualized skeletal structures are unremarkable. IMPRESSION: No active cardiopulmonary disease. Electronically Signed   By: Aram Candela M.D.   On: 09/01/2023 19:48        Scheduled Meds:  aspirin EC  81 mg Oral Daily   atorvastatin  40 mg Oral QHS   hydrALAZINE  50 mg Oral TID   insulin aspart  0-15 Units Subcutaneous TID WC   levothyroxine  75 mcg Oral Q0600   losartan  100 mg Oral Daily   Continuous Infusions:  heparin 1,150 Units/hr (09/02/23 0554)     LOS: 1 day    Time spent: 55 minutes    Kinslea Frances D Sherryll Burger, DO [facility]  If 7PM-7AM, please contact night-coverage [email or web address] 09/02/2023, 10:50 AM

## 2023-09-02 NOTE — ED Notes (Signed)
 Date and time results received: 09/02/23 0648 (use smartphrase ".now" to insert current time)  Test: Potassium Critical Value: 2.7  Name of Provider Notified: MD Sherryll Burger and MD Thomes Dinning  Orders Received? Or Actions Taken?: Orders Received - See Orders for details

## 2023-09-03 DIAGNOSIS — I214 Non-ST elevation (NSTEMI) myocardial infarction: Secondary | ICD-10-CM | POA: Diagnosis not present

## 2023-09-03 LAB — LIPID PANEL
Cholesterol: 114 mg/dL (ref 0–200)
HDL: 38 mg/dL — ABNORMAL LOW (ref >40)
LDL Cholesterol: 55 mg/dL (ref 0–99)
Total CHOL/HDL Ratio: 3 ratio
Triglycerides: 103 mg/dL (ref <150)
VLDL: 21 mg/dL (ref 0–40)

## 2023-09-03 LAB — CBC
HCT: 38.2 % — ABNORMAL LOW (ref 39.0–52.0)
Hemoglobin: 12 g/dL — ABNORMAL LOW (ref 13.0–17.0)
MCH: 23.1 pg — ABNORMAL LOW (ref 26.0–34.0)
MCHC: 31.4 g/dL (ref 30.0–36.0)
MCV: 73.5 fL — ABNORMAL LOW (ref 80.0–100.0)
Platelets: 192 10*3/uL (ref 150–400)
RBC: 5.2 MIL/uL (ref 4.22–5.81)
RDW: 15.7 % — ABNORMAL HIGH (ref 11.5–15.5)
WBC: 6.5 10*3/uL (ref 4.0–10.5)
nRBC: 0 % (ref 0.0–0.2)

## 2023-09-03 LAB — BASIC METABOLIC PANEL
Anion gap: 8 (ref 5–15)
BUN: 20 mg/dL (ref 8–23)
CO2: 25 mmol/L (ref 22–32)
Calcium: 8.7 mg/dL — ABNORMAL LOW (ref 8.9–10.3)
Chloride: 102 mmol/L (ref 98–111)
Creatinine, Ser: 1.39 mg/dL — ABNORMAL HIGH (ref 0.61–1.24)
GFR, Estimated: 53 mL/min — ABNORMAL LOW (ref >60)
Glucose, Bld: 185 mg/dL — ABNORMAL HIGH (ref 70–99)
Potassium: 3.9 mmol/L (ref 3.5–5.1)
Sodium: 135 mmol/L (ref 135–145)

## 2023-09-03 LAB — GLUCOSE, CAPILLARY: Glucose-Capillary: 163 mg/dL — ABNORMAL HIGH (ref 70–99)

## 2023-09-03 LAB — MAGNESIUM: Magnesium: 2 mg/dL (ref 1.7–2.4)

## 2023-09-03 NOTE — Discharge Summary (Signed)
 Physician Discharge Summary  Gerald Evans ZOX:[id number] DOB: June 21, 1946 DOA: 09/01/2023  PCP: Billie Lade, MD  Admit date: 09/01/2023  Discharge date: 09/03/2023  Admitted From:Home  Disposition:  Home  Recommendations for Outpatient Follow-up:  Follow up with PCP in 1-2 weeks Follow-up with cardiologist at [facility] as recommended in 3-4 weeks Continue home medications as prior  Home Health: None  Equipment/Devices: None  Discharge Condition:Stable  CODE STATUS: Full  Diet recommendation: Heart Healthy/carb modified  Brief/Interim Summary: Gerald Evans is a 78 y.o. male with medical history significant of hypertension, hyperlipidemia, T2DM, hypothyroidism who presents to the emergency department due to chest pain.  He is noted to have atypical chest pain with elevated troponin and was started on heparin drip empirically.  Initial plans were for transfer to [facility], but he has not been seen by cardiology with plans to further manage while hospitalized at [facility]  He was seen by cardiology noted to have atypical chest pain that was likely noncardiac in etiology.  D-dimer was low and there was low suspicion for PE.  2D echocardiogram performed with EF 55-60% and no regional wall motion abnormalities.  He has been recommended to follow-up with his primary cardiologist Dr. Juliann Pares at [facility] in 3-4 weeks.  No other acute events or concerns noted.  Discharge Diagnoses:  Principal Problem:   NSTEMI (non-ST elevated myocardial infarction) (HCC) Active Problems:   Mixed hyperlipidemia   Essential hypertension   Uncontrolled type 2 diabetes mellitus with hyperglycemia, with long-term current use of insulin (HCC)   Class 2 severe obesity with serious comorbidity and body mass index (BMI) of 37.0 to 37.9 in adult [facility])   Acquired hypothyroidism   Microcytic anemia  Principal discharge diagnosis: Atypical chest pain-noncardiac with mild troponin elevation.  Discharge  Instructions  Discharge Instructions     Diet - low sodium heart healthy   Complete by: As directed    Increase activity slowly   Complete by: As directed       Allergies as of 09/03/2023       Reactions   Lisinopril Cough        Medication List     TAKE these medications    atorvastatin 40 MG tablet Commonly known as: LIPITOR Take 1 tablet (40 mg total) by mouth daily.   BD Pen Needle Nano 2nd Gen 32G X 4 MM Misc Generic drug: Insulin Pen Needle 1 Package by Does not apply route 2 (two) times daily.   blood glucose meter kit and supplies Dispense based on patient and insurance preference. Use up to four times daily as directed. (FOR ICD-10 E11.65)   chlorthalidone 25 MG tablet Commonly known as: HYGROTON Take 1 tablet (25 mg total) by mouth daily.   hydrALAZINE 50 MG tablet Commonly known as: APRESOLINE TAKE 1 TABLET BY MOUTH THREE TIMES DAILY   levothyroxine 75 MCG tablet Commonly known as: SYNTHROID Take 1 tablet (75 mcg total) by mouth daily before breakfast.   losartan 100 MG tablet Commonly known as: COZAAR Take 1 tablet (100 mg total) by mouth daily.   metFORMIN 1000 MG tablet Commonly known as: GLUCOPHAGE Take 1 tablet (1,000 mg total) by mouth daily with breakfast.   NovoLOG 70/30 FlexPen (70-30) 100 UNIT/ML FlexPen Generic drug: insulin aspart protamine - aspart Inject 40 Units into the skin 2 (two) times daily with a meal.   OneTouch Delica Lancets 30G Misc 1 each by Does not apply route 3 (three) times daily. Use to check  blood glucose three times daily   OneTouch Ultra test strip Generic drug: glucose blood USE 1 STRIP TO CHECK GLUCOSE THREE TIMES DAILY AS DIRECTED E11.65   sildenafil 100 MG tablet Commonly known as: VIAGRA Take 1/2 tab in combination with 10mg  of tadalafil.   tadalafil 20 MG tablet Commonly known as: CIALIS Use either 1 tab po daily prn or 1/2 tab po with 1/2 tab sildenafil 100mg  po daily prn   Vitamin D  (Ergocalciferol) 1.25 MG (50000 UNIT) Caps capsule Commonly known as: DRISDOL Take 1 capsule (50,000 Units total) by mouth every 7 (seven) days.        Follow-up Information     Dorothyann Peng D, MD. Schedule an appointment as soon as possible for a visit in 4 week(s).   Specialties: Cardiology, Internal Medicine Contact information: [address] [place] [address] [phone]         Billie Lade, MD. Schedule an appointment as soon as possible for a visit in 2 week(s).   Specialty: Internal Medicine Contact information: [address] [place] [address] [phone]                Allergies  Allergen Reactions   Lisinopril Cough    Consultations: None   Procedures/Studies: ECHOCARDIOGRAM COMPLETE Result Date: 09/02/2023    ECHOCARDIOGRAM REPORT   Patient Name:   Gerald Evans Date of Exam: 09/02/2023 Medical Rec #:  [id number]       Height:       68.0 in Accession #:    [id number]      Weight:       246.3 lb Date of Birth:  [birth year]        BSA:          2.233 m Patient Age:    77 years        BP:           134/60 mmHg Patient Gender: M               HR:           64 bpm. [facility] Procedure: 2D Echo, Cardiac Doppler, Color Doppler and Intracardiac            Opacification Agent (Both Spectral and Color Flow Doppler were            utilized during procedure). Indications:    Nstemi  History:        Patient has no prior history of Echocardiogram examinations.                 CAD; Risk Factors:Hypertension and Diabetes.  Sonographer:    Amy Chionchio Referring Phys: [id number] Ellsworth Lennox IMPRESSIONS  1. Left ventricular ejection fraction, by estimation, is 55 to 60%. The left ventricle has normal function. The left ventricle has no regional wall motion abnormalities. 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 Left ventricular diastolic parameters are consistent with Grade I diastolic dysfunction  (impaired relaxation). Elevated left ventricular end-diastolic pressure.  2. Right ventricular systolic function is normal. The right ventricular size is normal. Tricuspid regurgitation signal is inadequate for assessing PA pressure.  3. Left atrial size was moderately dilated.  4. The mitral valve is grossly normal. No evidence of mitral valve regurgitation. No evidence of mitral stenosis.  5. The aortic valve is tricuspid. There is mild calcification of the aortic valve. Aortic valve regurgitation is not visualized. No aortic  stenosis is present.  6. The inferior vena cava is normal in size with greater than 50% respiratory variability, suggesting right atrial pressure of 3 mmHg. Comparison(s): No prior Echocardiogram. FINDINGS  Left Ventricle: Left ventricular ejection fraction, by estimation, is 55 to 60%. The left ventricle has normal function. The left ventricle has no regional wall motion abnormalities. Definity contrast agent was given IV to delineate the left ventricular  endocardial borders. Strain was performed and the global longitudinal strain is indeterminate. The left ventricular internal cavity size was normal in size. 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 Left ventricular diastolic parameters are consistent with Grade I diastolic dysfunction (impaired relaxation). Elevated left ventricular end-diastolic pressure. Right Ventricle: The right ventricular size is normal. No increase in right ventricular wall thickness. Right ventricular systolic function is normal. Tricuspid regurgitation signal is inadequate for assessing PA pressure. Left Atrium: Left atrial size was moderately dilated. Right Atrium: Right atrial size was normal in size. Pericardium: There is no evidence of pericardial effusion. Mitral Valve: The mitral valve is grossly normal. No evidence of mitral valve regurgitation. No evidence of mitral valve stenosis. MV peak gradient, 4.0 mmHg. The mean mitral  valve gradient is 2.0 mmHg. Tricuspid Valve: The tricuspid valve is grossly normal. Tricuspid valve regurgitation is not demonstrated. No evidence of tricuspid stenosis. Aortic Valve: The aortic valve is tricuspid. There is mild calcification of the aortic valve. Aortic valve regurgitation is not visualized. No aortic stenosis is present. Aortic valve mean gradient measures 4.0 mmHg. Aortic valve peak gradient measures 6.9 mmHg. Aortic valve area, by VTI measures 2.29 cm. Pulmonic Valve: The pulmonic valve was grossly normal. Pulmonic valve regurgitation is not visualized. No evidence of pulmonic stenosis. Aorta: The aortic root and ascending aorta are structurally normal, with no evidence of dilitation. Venous: The inferior vena cava is normal in size with greater than 50% respiratory variability, suggesting right atrial pressure of 3 mmHg. IAS/Shunts: The atrial septum is grossly normal. Additional Comments: 3D was performed not requiring image post processing on an independent workstation and was indeterminate.  LEFT VENTRICLE PLAX 2D LVIDd:         5.00 cm      Diastology LVIDs:         3.50 cm      LV e' medial:    5.55 cm/s LV PW:         1.40 cm      LV E/e' medial:  16.9 LV IVS:        1.60 cm      LV e' lateral:   5.98 cm/s LVOT diam:     2.00 cm      LV E/e' lateral: 15.7 LV SV:         65 LV SV Index:   29 LVOT Area:     3.14 cm  LV Volumes (MOD) LV vol d, MOD A4C: 120.0 ml LV vol s, MOD A4C: 41.9 ml LV SV MOD A4C:     120.0 ml RIGHT VENTRICLE             IVC RV Basal diam:  4.50 cm     IVC diam: 1.70 cm RV S prime:     11.60 cm/s TAPSE (M-mode): 2.3 cm LEFT ATRIUM            Index        RIGHT ATRIUM           Index LA diam:  3.00 cm  1.34 cm/m   RA Area:     19.30 cm LA Vol (A4C): 110.0 ml 49.26 ml/m  RA Volume:   62.10 ml  27.81 ml/m  AORTIC VALVE                    PULMONIC VALVE AV Area (Vmax):    2.25 cm     PV Vmax:       1.08 m/s AV Area (Vmean):   2.07 cm     PV Peak grad:  4.7 mmHg  AV Area (VTI):     2.29 cm AV Vmax:           131.00 cm/s AV Vmean:          98.200 cm/s AV VTI:            0.284 m AV Peak Grad:      6.9 mmHg AV Mean Grad:      4.0 mmHg LVOT Vmax:         94.00 cm/s LVOT Vmean:        64.700 cm/s LVOT VTI:          0.207 m LVOT/AV VTI ratio: 0.73  AORTA Ao Root diam: 2.90 cm MITRAL VALVE MV Area (PHT): 3.37 cm    SHUNTS MV Area VTI:   2.09 cm    Systemic VTI:  0.21 m MV Peak grad:  4.0 mmHg    Systemic Diam: 2.00 cm MV Mean grad:  2.0 mmHg MV Vmax:       1.00 m/s MV Vmean:      56.9 cm/s MV Decel Time: 225 msec MV E velocity: 93.80 cm/s MV A velocity: 81.40 cm/s MV E/A ratio:  1.15 Vishnu Priya Mallipeddi Electronically signed by Winfield Rast Mallipeddi Signature Date/Time: 09/02/2023/5:49:34 PM    Final    DG Chest 2 View Result Date: 09/01/2023 CLINICAL DATA:  Right shoulder pain. EXAM: CHEST - 2 VIEW COMPARISON:  June 25, 2022 FINDINGS: The cardiac silhouette is mildly enlarged and unchanged in size. There is marked severity calcification of the aortic arch. Both lungs are clear. The visualized skeletal structures are unremarkable. IMPRESSION: No active cardiopulmonary disease. Electronically Signed   By: Aram Candela M.D.   On: 09/01/2023 19:48     Discharge Exam: Vitals:   09/02/23 2015 09/03/23 0512  BP: 132/63 105/68  Pulse: 70 72  Resp: 17 18  Temp: 98.3 F (36.8 C) 99.3 F (37.4 C)  SpO2: 97% 93%   Vitals:   09/02/23 1347 09/02/23 1750 09/02/23 2015 09/03/23 0512  BP: 134/60 130/63 132/63 105/68  Pulse: 65 68 70 72  Resp: 16 17 17 18   Temp: 98 F (36.7 C) 98.2 F (36.8 C) 98.3 F (36.8 C) 99.3 F (37.4 C)  TempSrc: Oral  Oral Oral  SpO2: 97% 97% 97% 93%  Weight: 111.7 kg     Height: 5\' 8"  (1.727 m)       General: Pt is alert, awake, not in acute distress Cardiovascular: RRR, S1/S2 +, no rubs, no gallops Respiratory: CTA bilaterally, no wheezing, no rhonchi Abdominal: Soft, NT, ND, bowel sounds + Extremities: no edema, no  cyanosis    The results of significant diagnostics from this hospitalization (including imaging, microbiology, ancillary and laboratory) are listed below for reference.     Microbiology: No results found for this or any previous visit (from the past 240 hours).   Labs: BNP (last 3 results) No results for input(s): "BNP" in  the last 8760 hours. Basic Metabolic Panel: Recent Labs  Lab 09/01/23 1739 09/02/23 0543 09/02/23 1428 09/03/23 0311  NA 137 137 132* 135  K 3.7 2.7* 3.9 3.9  CL 99 102 99 102  CO2 29 24 24 25   GLUCOSE 92 168* 239* 185*  BUN 18 16 16 20   CREATININE 1.22 1.21 1.22 1.39*  CALCIUM 9.4 8.6* 8.9 8.7*  MG  --  1.5*  --  2.0  PHOS  --  3.7  --   --    Liver Function Tests: Recent Labs  Lab 09/02/23 0543  AST 16  ALT 19  ALKPHOS 67  BILITOT 0.7  PROT 6.3*  ALBUMIN 3.3*   No results for input(s): "LIPASE", "AMYLASE" in the last 168 hours. No results for input(s): "AMMONIA" in the last 168 hours. CBC: Recent Labs  Lab 09/01/23 1739 09/02/23 0543 09/03/23 0311  WBC 7.6 7.7 6.5  NEUTROABS 3.7  --   --   HGB 12.9* 11.6* 12.0*  HCT 41.8 36.4* 38.2*  MCV 73.7* 72.8* 73.5*  PLT 221 177 192   Cardiac Enzymes: No results for input(s): "CKTOTAL", "CKMB", "CKMBINDEX", "TROPONINI" in the last 168 hours. BNP: Invalid input(s): "POCBNP" CBG: Recent Labs  Lab 09/02/23 0519 09/02/23 0808 09/02/23 1637 09/02/23 2116 09/03/23 0747  GLUCAP 172* 144* 160* 211* 163*   D-Dimer Recent Labs    09/02/23 1018  DDIMER 0.43   Hgb A1c No results for input(s): "HGBA1C" in the last 72 hours. Lipid Profile Recent Labs    09/03/23 0311  CHOL 114  HDL 38*  LDLCALC 55  TRIG 604  CHOLHDL 3.0   Thyroid function studies No results for input(s): "TSH", "T4TOTAL", "T3FREE", "THYROIDAB" in the last 72 hours.  Invalid input(s): "FREET3" Anemia work up Recent Labs    09/02/23 0543  FERRITIN 100  TIBC 275  IRON 92   Urinalysis    Component Value  Date/Time   COLORURINE YELLOW 08/21/2020 1328   APPEARANCEUR Clear 02/18/2023 1052   LABSPEC 1.025 08/21/2020 1328   PHURINE 6.0 08/21/2020 1328   GLUCOSEU Trace (A) 02/18/2023 1052   HGBUR LARGE (A) 08/21/2020 1328   BILIRUBINUR Negative 02/18/2023 1052   KETONESUR 20 (A) 08/21/2020 1328   PROTEINUR Trace 02/18/2023 1052   PROTEINUR >=300 (A) 08/21/2020 1328   NITRITE Negative 02/18/2023 1052   NITRITE NEGATIVE 08/21/2020 1328   LEUKOCYTESUR Negative 02/18/2023 1052   LEUKOCYTESUR MODERATE (A) 08/21/2020 1328   Sepsis Labs Recent Labs  Lab 09/01/23 1739 09/02/23 0543 09/03/23 0311  WBC 7.6 7.7 6.5   Microbiology No results found for this or any previous visit (from the past 240 hours).   Time coordinating discharge: 35 minutes  SIGNED:   Erick Blinks, DO [facility] 09/03/2023, 9:32 AM  If 7PM-7AM, please contact night-coverage [email or web address]

## 2023-09-03 NOTE — Care Management Important Message (Signed)
 Important Message  Patient Details  Name: Gerald Evans MRN: [id number] Date of Birth: 1945/07/24   Important Message Given:  Yes - Medicare IM     Corey Harold 09/03/2023, 12:14 PM

## 2023-09-03 NOTE — Progress Notes (Signed)
    Notified by Dr. Jenene Slicker there are no plans for additional cardiac workup at this time  D-dimer was negative at 0.43 when checked yesterday and echocardiogram showed a preserved EF of 55 to 60% with no regional wall motion abnormalities. He did have severe LVH of the septal segment, grade 1 diastolic dysfunction, normal RV function and no significant valve abnormalities. Updated Dr. Sherryll Burger as well. Can follow-up with his primary cardiologist (Dr. Juliann Pares) in 3-4 weeks.   Signed, Ellsworth Lennox, PA-C 09/03/2023, 7:59 AM Pager: [phone]

## 2023-09-04 ENCOUNTER — Emergency Department (HOSPITAL_COMMUNITY)

## 2023-09-04 ENCOUNTER — Other Ambulatory Visit: Payer: Self-pay

## 2023-09-04 ENCOUNTER — Encounter (HOSPITAL_COMMUNITY): Payer: Self-pay

## 2023-09-04 ENCOUNTER — Emergency Department (HOSPITAL_COMMUNITY)
Admission: EM | Admit: 2023-09-04 | Discharge: 2023-09-04 | Disposition: A | Discharge status: Home / Self Care | Attending: Emergency Medicine | Admitting: Emergency Medicine

## 2023-09-04 DIAGNOSIS — R112 Nausea with vomiting, unspecified: Secondary | ICD-10-CM | POA: Insufficient documentation

## 2023-09-04 DIAGNOSIS — R197 Diarrhea, unspecified: Secondary | ICD-10-CM | POA: Diagnosis not present

## 2023-09-04 DIAGNOSIS — K529 Noninfective gastroenteritis and colitis, unspecified: Secondary | ICD-10-CM

## 2023-09-04 DIAGNOSIS — R1084 Generalized abdominal pain: Secondary | ICD-10-CM | POA: Diagnosis present

## 2023-09-04 DIAGNOSIS — Z794 Long term (current) use of insulin: Secondary | ICD-10-CM | POA: Insufficient documentation

## 2023-09-04 LAB — CBC WITH DIFFERENTIAL/PLATELET
Abs Immature Granulocytes: 0.04 10*3/uL (ref 0.00–0.07)
Basophils Absolute: 0 10*3/uL (ref 0.0–0.1)
Basophils Relative: 0 %
Eosinophils Absolute: 0.1 10*3/uL (ref 0.0–0.5)
Eosinophils Relative: 1 %
HCT: 44 % (ref 39.0–52.0)
Hemoglobin: 14 g/dL (ref 13.0–17.0)
Immature Granulocytes: 0 %
Lymphocytes Relative: 27 %
Lymphs Abs: 2.6 10*3/uL (ref 0.7–4.0)
MCH: 23.2 pg — ABNORMAL LOW (ref 26.0–34.0)
MCHC: 31.8 g/dL (ref 30.0–36.0)
MCV: 73 fL — ABNORMAL LOW (ref 80.0–100.0)
Monocytes Absolute: 0.8 10*3/uL (ref 0.1–1.0)
Monocytes Relative: 9 %
Neutro Abs: 6 10*3/uL (ref 1.7–7.7)
Neutrophils Relative %: 63 %
Platelets: 249 10*3/uL (ref 150–400)
RBC: 6.03 MIL/uL — ABNORMAL HIGH (ref 4.22–5.81)
RDW: 17 % — ABNORMAL HIGH (ref 11.5–15.5)
WBC: 9.5 10*3/uL (ref 4.0–10.5)
nRBC: 0 % (ref 0.0–0.2)

## 2023-09-04 LAB — RESP PANEL BY RT-PCR (RSV, FLU A&B, COVID)  RVPGX2
Influenza A by PCR: NEGATIVE
Influenza B by PCR: NEGATIVE
Resp Syncytial Virus by PCR: NEGATIVE
SARS Coronavirus 2 by RT PCR: NEGATIVE

## 2023-09-04 LAB — URINALYSIS, ROUTINE W REFLEX MICROSCOPIC
Bilirubin Urine: NEGATIVE
Glucose, UA: NEGATIVE mg/dL
Hgb urine dipstick: NEGATIVE
Ketones, ur: NEGATIVE mg/dL
Leukocytes,Ua: NEGATIVE
Nitrite: NEGATIVE
Protein, ur: 30 mg/dL — AB
Specific Gravity, Urine: 1.023 (ref 1.005–1.030)
pH: 5 (ref 5.0–8.0)

## 2023-09-04 LAB — COMPREHENSIVE METABOLIC PANEL
ALT: 24 U/L (ref 0–44)
AST: 27 U/L (ref 15–41)
Albumin: 4.2 g/dL (ref 3.5–5.0)
Alkaline Phosphatase: 80 U/L (ref 38–126)
Anion gap: 12 (ref 5–15)
BUN: 18 mg/dL (ref 8–23)
CO2: 25 mmol/L (ref 22–32)
Calcium: 10.3 mg/dL (ref 8.9–10.3)
Chloride: 101 mmol/L (ref 98–111)
Creatinine, Ser: 1.3 mg/dL — ABNORMAL HIGH (ref 0.61–1.24)
GFR, Estimated: 57 mL/min — ABNORMAL LOW (ref >60)
Glucose, Bld: 154 mg/dL — ABNORMAL HIGH (ref 70–99)
Potassium: 4.1 mmol/L (ref 3.5–5.1)
Sodium: 138 mmol/L (ref 135–145)
Total Bilirubin: 0.8 mg/dL (ref 0.0–1.2)
Total Protein: 8.1 g/dL (ref 6.5–8.1)

## 2023-09-04 LAB — LIPASE, BLOOD: Lipase: 42 U/L (ref 11–51)

## 2023-09-04 LAB — AMMONIA: Ammonia: 24 umol/L (ref 9–35)

## 2023-09-04 MED ORDER — ONDANSETRON HCL 4 MG/2ML IJ SOLN
4.0000 mg | Freq: Once | INTRAMUSCULAR | Status: AC
  Administered 2023-09-04: 4 mg via INTRAVENOUS
  Filled 2023-09-04: qty 2

## 2023-09-04 MED ORDER — PANTOPRAZOLE SODIUM 40 MG IV SOLR
40.0000 mg | Freq: Once | INTRAVENOUS | Status: AC
  Administered 2023-09-04: 40 mg via INTRAVENOUS
  Filled 2023-09-04: qty 10

## 2023-09-04 MED ORDER — FAMOTIDINE 20 MG PO TABS: 20.0000 mg | ORAL_TABLET | Freq: Every day | ORAL | 0 refills | Status: DC

## 2023-09-04 MED ORDER — ONDANSETRON HCL 4 MG PO TABS: 4.0000 mg | ORAL_TABLET | Freq: Four times a day (QID) | ORAL | 0 refills | Status: DC

## 2023-09-04 MED ORDER — SODIUM CHLORIDE 0.9 % IV BOLUS
500.0000 mL | Freq: Once | INTRAVENOUS | Status: AC
  Administered 2023-09-04: 500 mL via INTRAVENOUS

## 2023-09-04 MED ORDER — IOHEXOL 350 MG/ML SOLN
75.0000 mL | Freq: Once | INTRAVENOUS | Status: AC | PRN
  Administered 2023-09-04: 75 mL via INTRAVENOUS

## 2023-09-04 MED ORDER — FAMOTIDINE IN NACL 20-0.9 MG/50ML-% IV SOLN
20.0000 mg | Freq: Once | INTRAVENOUS | Status: AC
  Administered 2023-09-04: 20 mg via INTRAVENOUS
  Filled 2023-09-04: qty 50

## 2023-09-04 NOTE — ED Provider Triage Note (Signed)
 Emergency Medicine Provider Triage Evaluation Note  ADREAN HEITZ , a 78 y.o. male  was evaluated in triage.  Pt complains of abdominal pain.  Patient reports that he was discharged from the hospital yesterday for NSTEMI and started having diffuse abdominal pain last night with vomiting.  He has had 2 episode of vomiting as well as multiple episodes of diarrhea.  Denies eating any new foods.  Denies fevers.  Review of Systems  Positive: Nausea, vomiting, abdominal pain, diarrhea Negative: Fevers, chest pain, shortness of breath  Physical Exam  BP (!) 149/77 (BP Location: Right Arm)   Pulse 68   Temp 98 F (36.7 C) (Oral)   Resp 16   Ht 5\' 8"  (1.727 m)   Wt 111.7 kg   SpO2 97%   BMI 37.44 kg/m  Gen:   Awake, no distress   Resp:  Normal effort  MSK:   Moves extremities without difficulty  Other:  Generalized abdominal tenderness  Medical Decision Making  Medically screening exam initiated at 2:06 PM.  Appropriate orders placed.  JHORDAN MCKIBBEN was informed that the remainder of the evaluation will be completed by another provider, this initial triage assessment does not replace that evaluation, and the importance of remaining in the ED until their evaluation is complete.   Maxwell Marion, PA-C 09/04/23 1409

## 2023-09-04 NOTE — ED Provider Notes (Signed)
 [facility] EMERGENCY DEPARTMENT AT [facility] Provider Note   CSN: [id number] Arrival date & time: 09/04/23  1340     History  Chief Complaint  Patient presents with   Abdominal Pain   Emesis   Diarrhea    Gerald Evans is a 78 y.o. male.  HPI   78 year old male who was recently discharged yesterday after admission for chest pain that was deemed noncardiac presents the emergency department with generalized abdominal pain, swelling, nausea/vomiting/diarrhea.  Patient states that this started last night after he got home from the hospital.  He is describing generalized abdominal cramping, distention.  Denies any fever.  He states that the emesis and diarrhea is nonbloody.  No dysuria or hematuria.  No ongoing chest pain or shortness of breath.  Home Medications Prior to Admission medications   Medication Sig Start Date End Date Taking? Authorizing Provider  atorvastatin (LIPITOR) 40 MG tablet Take 1 tablet (40 mg total) by mouth daily. 08/06/23   Billie Lade, MD  blood glucose meter kit and supplies Dispense based on patient and insurance preference. Use up to four times daily as directed. (FOR ICD-10 E11.65) 11/29/19   [name], MD  chlorthalidone (HYGROTON) 25 MG tablet Take 1 tablet (25 mg total) by mouth daily. 08/06/23   Billie Lade, MD  glucose blood (ONETOUCH ULTRA) test strip USE 1 STRIP TO CHECK GLUCOSE THREE TIMES DAILY AS DIRECTED E11.65 03/11/23   [name], MD  hydrALAZINE (APRESOLINE) 50 MG tablet TAKE 1 TABLET BY MOUTH THREE TIMES DAILY 06/28/23   Billie Lade, MD  insulin aspart protamine - aspart (NOVOLOG 70/30 FLEXPEN) (70-30) 100 UNIT/ML FlexPen Inject 40 Units into the skin 2 (two) times daily with a meal. 03/22/23   Reardon, Freddi Starr, NP  Insulin Pen Needle (BD PEN NEEDLE NANO 2ND GEN) 32G X 4 MM MISC 1 Package by Does not apply route 2 (two) times daily. 05/06/22   Langston Reusing, MD  levothyroxine (SYNTHROID) 75  MCG tablet Take 1 tablet (75 mcg total) by mouth daily before breakfast. 07/20/23   [name], NP  losartan (COZAAR) 100 MG tablet Take 1 tablet (100 mg total) by mouth daily. 09/01/23   Billie Lade, MD  metFORMIN (GLUCOPHAGE) 1000 MG tablet Take 1 tablet (1,000 mg total) by mouth daily with breakfast. 07/20/23   [name], NP  OneTouch Delica Lancets 30G MISC 1 each by Does not apply route 3 (three) times daily. Use to check blood glucose three times daily 12/09/20   [name], MD  sildenafil (VIAGRA) 100 MG tablet Take 1/2 tab in combination with 10mg  of tadalafil. 02/18/23   Bjorn Pippin, MD  tadalafil (CIALIS) 20 MG tablet Use either 1 tab po daily prn or 1/2 tab po with 1/2 tab sildenafil 100mg  po daily prn 08/20/22   Bjorn Pippin, MD  Vitamin D, Ergocalciferol, (DRISDOL) 1.25 MG (50000 UNIT) CAPS capsule Take 1 capsule (50,000 Units total) by mouth every 7 (seven) days. 07/15/23   [name], DO      Allergies    Lisinopril    Review of Systems   Review of Systems  Constitutional:  Positive for fatigue. Negative for fever.  Respiratory:  Negative for shortness of breath.   Cardiovascular:  Negative for chest pain.  Gastrointestinal:  Positive for abdominal distention, abdominal pain, diarrhea, nausea and vomiting. Negative for blood in stool.  Genitourinary:  Negative for dysuria and hematuria.  Skin:  Negative for rash.  Neurological:  Negative for headaches.    Physical Exam Updated Vital Signs BP (!) 149/77 (BP Location: Right Arm)   Pulse 68   Temp 98 F (36.7 C) (Oral)   Resp 16   Ht 5\' 8"  (1.727 m)   Wt 111.7 kg   SpO2 97%   BMI 37.44 kg/m  Physical Exam Vitals and nursing note reviewed.  Constitutional:      General: He is not in acute distress.    Appearance: Normal appearance.  HENT:     Head: Normocephalic.     Mouth/Throat:     Mouth: Mucous membranes are moist.  Cardiovascular:     Rate and Rhythm: Normal rate.  Pulmonary:      Effort: Pulmonary effort is normal. No respiratory distress.  Abdominal:     General: Abdomen is protuberant. Bowel sounds are normal. There is distension.     Palpations: Abdomen is soft. There is no shifting dullness.     Tenderness: There is generalized abdominal tenderness. There is no guarding.  Skin:    General: Skin is warm.  Neurological:     Mental Status: He is alert and oriented to person, place, and time. Mental status is at baseline.  Psychiatric:        Mood and Affect: Mood normal.     ED Results / Procedures / Treatments   Labs (all labs ordered are listed, but only abnormal results are displayed) Labs Reviewed  COMPREHENSIVE METABOLIC PANEL - Abnormal; Notable for the following components:      Result Value   Glucose, Bld 154 (*)    Creatinine, Ser 1.30 (*)    GFR, Estimated 57 (*)    All other components within normal limits  CBC WITH DIFFERENTIAL/PLATELET - Abnormal; Notable for the following components:   RBC 6.03 (*)    MCV 73.0 (*)    MCH 23.2 (*)    RDW 17.0 (*)    All other components within normal limits  URINALYSIS, ROUTINE W REFLEX MICROSCOPIC - Abnormal; Notable for the following components:   APPearance HAZY (*)    Protein, ur 30 (*)    Bacteria, UA RARE (*)    All other components within normal limits  RESP PANEL BY RT-PCR (RSV, FLU A&B, COVID)  [id number]  LIPASE, BLOOD  AMMONIA    EKG None  Radiology ECHOCARDIOGRAM COMPLETE Result Date: 09/02/2023    ECHOCARDIOGRAM REPORT   Patient Name:   Gerald Evans Date of Exam: 09/02/2023 Medical Rec #:  [id number]       Height:       68.0 in Accession #:    [id number]      Weight:       246.3 lb Date of Birth:  [birth year]        BSA:          2.233 m Patient Age:    77 years        BP:           134/60 mmHg Patient Gender: M               HR:           64 bpm. [facility] Procedure: 2D Echo, Cardiac Doppler, Color Doppler and Intracardiac            Opacification Agent (Both Spectral and  Color Flow Doppler were            utilized during procedure).  Indications:    Nstemi  History:        Patient has no prior history of Echocardiogram examinations.                 CAD; Risk Factors:Hypertension and Diabetes.  Sonographer:    Amy Chionchio Referring Phys: [id number] Ellsworth Lennox IMPRESSIONS  1. Left ventricular ejection fraction, by estimation, is 55 to 60%. The left ventricle has normal function. The left ventricle has no regional wall motion abnormalities. 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 Left ventricular diastolic parameters are consistent with Grade I diastolic dysfunction (impaired relaxation). Elevated left ventricular end-diastolic pressure.  2. Right ventricular systolic function is normal. The right ventricular size is normal. Tricuspid regurgitation signal is inadequate for assessing PA pressure.  3. Left atrial size was moderately dilated.  4. The mitral valve is grossly normal. No evidence of mitral valve regurgitation. No evidence of mitral stenosis.  5. The aortic valve is tricuspid. There is mild calcification of the aortic valve. Aortic valve regurgitation is not visualized. No aortic stenosis is present.  6. The inferior vena cava is normal in size with greater than 50% respiratory variability, suggesting right atrial pressure of 3 mmHg. Comparison(s): No prior Echocardiogram. FINDINGS  Left Ventricle: Left ventricular ejection fraction, by estimation, is 55 to 60%. The left ventricle has normal function. The left ventricle has no regional wall motion abnormalities. Definity contrast agent was given IV to delineate the left ventricular  endocardial borders. Strain was performed and the global longitudinal strain is indeterminate. The left ventricular internal cavity size was normal in size. 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 Left ventricular diastolic parameters are consistent with Grade I diastolic dysfunction  (impaired relaxation). Elevated left ventricular end-diastolic pressure. Right Ventricle: The right ventricular size is normal. No increase in right ventricular wall thickness. Right ventricular systolic function is normal. Tricuspid regurgitation signal is inadequate for assessing PA pressure. Left Atrium: Left atrial size was moderately dilated. Right Atrium: Right atrial size was normal in size. Pericardium: There is no evidence of pericardial effusion. Mitral Valve: The mitral valve is grossly normal. No evidence of mitral valve regurgitation. No evidence of mitral valve stenosis. MV peak gradient, 4.0 mmHg. The mean mitral valve gradient is 2.0 mmHg. Tricuspid Valve: The tricuspid valve is grossly normal. Tricuspid valve regurgitation is not demonstrated. No evidence of tricuspid stenosis. Aortic Valve: The aortic valve is tricuspid. There is mild calcification of the aortic valve. Aortic valve regurgitation is not visualized. No aortic stenosis is present. Aortic valve mean gradient measures 4.0 mmHg. Aortic valve peak gradient measures 6.9 mmHg. Aortic valve area, by VTI measures 2.29 cm. Pulmonic Valve: The pulmonic valve was grossly normal. Pulmonic valve regurgitation is not visualized. No evidence of pulmonic stenosis. Aorta: The aortic root and ascending aorta are structurally normal, with no evidence of dilitation. Venous: The inferior vena cava is normal in size with greater than 50% respiratory variability, suggesting right atrial pressure of 3 mmHg. IAS/Shunts: The atrial septum is grossly normal. Additional Comments: 3D was performed not requiring image post processing on an independent workstation and was indeterminate.  LEFT VENTRICLE PLAX 2D LVIDd:         5.00 cm      Diastology LVIDs:         3.50 cm      LV e' medial:    5.55 cm/s LV PW:         1.40 cm  LV E/e' medial:  16.9 LV IVS:        1.60 cm      LV e' lateral:   5.98 cm/s LVOT diam:     2.00 cm      LV E/e' lateral: 15.7 LV SV:          65 LV SV Index:   29 LVOT Area:     3.14 cm  LV Volumes (MOD) LV vol d, MOD A4C: 120.0 ml LV vol s, MOD A4C: 41.9 ml LV SV MOD A4C:     120.0 ml RIGHT VENTRICLE             IVC RV Basal diam:  4.50 cm     IVC diam: 1.70 cm RV S prime:     11.60 cm/s TAPSE (M-mode): 2.3 cm LEFT ATRIUM            Index        RIGHT ATRIUM           Index LA diam:      3.00 cm  1.34 cm/m   RA Area:     19.30 cm LA Vol (A4C): 110.0 ml 49.26 ml/m  RA Volume:   62.10 ml  27.81 ml/m  AORTIC VALVE                    PULMONIC VALVE AV Area (Vmax):    2.25 cm     PV Vmax:       1.08 m/s AV Area (Vmean):   2.07 cm     PV Peak grad:  4.7 mmHg AV Area (VTI):     2.29 cm AV Vmax:           131.00 cm/s AV Vmean:          98.200 cm/s AV VTI:            0.284 m AV Peak Grad:      6.9 mmHg AV Mean Grad:      4.0 mmHg LVOT Vmax:         94.00 cm/s LVOT Vmean:        64.700 cm/s LVOT VTI:          0.207 m LVOT/AV VTI ratio: 0.73  AORTA Ao Root diam: 2.90 cm MITRAL VALVE MV Area (PHT): 3.37 cm    SHUNTS MV Area VTI:   2.09 cm    Systemic VTI:  0.21 m MV Peak grad:  4.0 mmHg    Systemic Diam: 2.00 cm MV Mean grad:  2.0 mmHg MV Vmax:       1.00 m/s MV Vmean:      56.9 cm/s MV Decel Time: 225 msec MV E velocity: 93.80 cm/s MV A velocity: 81.40 cm/s MV E/A ratio:  1.15 Vishnu Priya Mallipeddi Electronically signed by Winfield Rast Mallipeddi Signature Date/Time: 09/02/2023/5:49:34 PM    Final     Procedures Procedures    Medications Ordered in ED Medications  famotidine (PEPCID) IVPB 20 mg premix (20 mg Intravenous New Bag/Given 09/04/23 1603)  ondansetron (ZOFRAN) injection 4 mg (4 mg Intravenous Given 09/04/23 1602)  sodium chloride 0.9 % bolus 500 mL (500 mLs Intravenous New Bag/Given 09/04/23 1602)  iohexol (OMNIPAQUE) 350 MG/ML injection 75 mL (75 mLs Intravenous Contrast Given 09/04/23 1616)    ED Course/ Medical Decision Making/ A&P  Medical Decision Making Amount and/or Complexity of Data  Reviewed Labs: ordered.  Risk Prescription drug management.   78 year old male presents emergency department with nausea/vomiting/diarrhea, started last night.  Vitals are normal and stable on arrival.  He feels like his abdomen is distended but it is soft, normal bowel sounds on exam.  Blood work is reassuring, shows no acute abnormality outside of a slightly elevated creatinine at 1.3.  Abdominal labs are negative, respiratory panel is negative.  CT of the abdomen pelvis shows findings of diarrheal illness.  I believe he is suffering from a viral gastroenteritis.  They do mention ileus.  He continues to pass gas in the room, abdomen is soft.  Do not feel he needs emergent admission in regards to this.  After IV medicine patient feels improved, has been able to tolerate p.o. without any vomiting.  Patient and family have been educated on possible diagnosis of ileus and are aware of any worsening symptoms that he would need to return for in regards to obstruction.  Patient at this time appears safe and stable for discharge and close outpatient follow up. Discharge plan and strict return to ED precautions discussed, patient verbalizes understanding and agreement.        Final Clinical Impression(s) / ED Diagnoses Final diagnoses:  None    Rx / DC Orders ED Discharge Orders     None         Rozelle Logan, DO 09/04/23 2016

## 2023-09-04 NOTE — ED Triage Notes (Signed)
 Pt c/o N/V/D and abd pain started 2 days ago. Pt D/C'd from hosp 2 days ago

## 2023-09-04 NOTE — Discharge Instructions (Addendum)
 You have been seen and discharged from the emergency department.  Your workup today looks consistent with a diarrheal illness, most likely viral gastroenteritis.  You may have a mild ileus as well identified on imaging.  Start with a bland liquid diet, and you may advance as tolerated.  If you start vomiting anytime you try to eat or you stop passing gas/stool or you have severe bloating of the abdomen you need to be reevaluated immediately.  Called to establish care with gastroenterologist for further evaluation of reflux symptoms.  Otherwise you may take nausea medicine as needed, take Pepcid as prescribed.  Follow-up with your primary provider for further evaluation and further care. Take home medications as prescribed. If you have any worsening symptoms or further concerns for your health please return to an emergency department for further evaluation.

## 2023-09-04 NOTE — ED Notes (Signed)
 PO challenge tolerated well with water and then saltines

## 2023-09-06 ENCOUNTER — Other Ambulatory Visit: Payer: Self-pay

## 2023-09-06 ENCOUNTER — Telehealth: Payer: Self-pay | Admitting: Internal Medicine

## 2023-09-06 ENCOUNTER — Telehealth: Payer: Self-pay | Admitting: *Deleted

## 2023-09-06 ENCOUNTER — Telehealth: Payer: Self-pay | Admitting: Gastroenterology

## 2023-09-06 MED ORDER — METOPROLOL TARTRATE 100 MG PO TABS: 100.0000 mg | ORAL_TABLET | Freq: Two times a day (BID) | ORAL | 0 refills | Status: DC

## 2023-09-06 NOTE — Telephone Encounter (Signed)
 Good morning Dr. Leone Payor  Doc of the Day AM 3/10  We received a call from this patient's daughter wishing for patient to be seen urgently due to a recent ER visit. She states [employer] soonest appointment is in June and she does not feel patient should wait that long to be seen. I advised our soonest appointment would be in May, however she stated that was still sooner than June. She would like transfer request to be made. Would you please review and advise on scheduling?  Thank you.

## 2023-09-06 NOTE — Telephone Encounter (Signed)
 Error

## 2023-09-06 NOTE — Transitions of Care (Post Inpatient/ED Visit) (Signed)
 09/06/2023  Name: Gerald Evans MRN: [id number] DOB: February 22, 1946  Today's TOC FU Call Status: Today's TOC FU Call Status:: Successful TOC FU Call Completed TOC FU Call Complete Date: 09/06/23 Patient's Name and Date of Birth confirmed.  Transition Care Management Follow-up Telephone Call Date of Discharge: 09/03/23 [facility] Type of Discharge: Inpatient Admission Primary Inpatient Discharge Diagnosis:: non-ST elevated myocardial infarction How have you been since you were released from the hospital?: Better (heis better but having some burning acid feeling in back of throat) Any questions or concerns?: Yes Patient Questions/Concerns:: daughter wanted him in to see gastro Patient Questions/Concerns Addressed: Other: (Dr Leone Payor wants patient to see PCP first then if needed for him to refer. RN got a follow up appt with PCP)  Items Reviewed: Did you receive and understand the discharge instructions provided?: Yes Medications obtained,verified, and reconciled?: Yes (Medications Reviewed) Any new allergies since your discharge?: No Dietary orders reviewed?: No Do you have support at home?: Yes People in Home: spouse Name of Support/Comfort Primary Source: Gerald Evans  Medications Reviewed Today: Medications Reviewed Today     Reviewed by Luella Cook, RN (Case Manager) on 09/06/23 at 1127  Med List Status: <None>   Medication Order Taking? Sig Documenting Provider Last Dose Status Informant  atorvastatin (LIPITOR) 40 MG tablet [id number] Yes Take 1 tablet (40 mg total) by mouth daily. Billie Lade, MD Taking Active Self  blood glucose meter kit and supplies [id number] Yes Dispense based on patient and insurance preference. Use up to four times daily as directed. (FOR ICD-10 E11.65) Gerald Kayser, MD Taking Active Self  chlorthalidone (HYGROTON) 25 MG tablet [id number] Yes Take 1 tablet (25 mg total) by mouth daily. Billie Lade, MD Taking  Active Self  famotidine (PEPCID) 20 MG tablet [id number] Yes Take 1 tablet (20 mg total) by mouth daily. Horton, Clabe Seal, DO Taking Active   glucose blood (ONETOUCH ULTRA) test strip [id number] Yes USE 1 STRIP TO CHECK GLUCOSE THREE TIMES DAILY AS DIRECTED E11.65 Gerald Kayser, MD Taking Active Self  hydrALAZINE (APRESOLINE) 50 MG tablet [id number] Yes TAKE 1 TABLET BY MOUTH THREE TIMES DAILY Billie Lade, MD Taking Active Self  insulin aspart protamine - aspart (NOVOLOG 70/30 FLEXPEN) (70-30) 100 UNIT/ML FlexPen [id number] Yes Inject 40 Units into the skin 2 (two) times daily with a meal. Gerald Gobble, NP Taking Active Self  Insulin Pen Needle (BD PEN NEEDLE NANO 2ND GEN) 32G X 4 MM MISC [id number] Yes 1 Package by Does not apply route 2 (two) times daily. Langston Reusing, MD Taking Active Self  levothyroxine (SYNTHROID) 75 MCG tablet [id number] Yes Take 1 tablet (75 mcg total) by mouth daily before breakfast. Gerald Gobble, NP Taking Active Self  losartan (COZAAR) 100 MG tablet [id number] Yes Take 1 tablet (100 mg total) by mouth daily. Billie Lade, MD Taking Active Self  metFORMIN (GLUCOPHAGE) 1000 MG tablet [id number] Yes Take 1 tablet (1,000 mg total) by mouth daily with breakfast. Gerald Gobble, NP Taking Active Self  ondansetron (ZOFRAN) 4 MG tablet [id number] Yes Take 1 tablet (4 mg total) by mouth every 6 (six) hours. Horton, Clabe Seal, DO Taking Active   OneTouch Delica Lancets 30G MISC [id number] Yes 1 each by Does not apply route 3 (three) times daily. Use to check blood glucose three times daily Gerald Kayser, MD Taking Active Self  sildenafil (VIAGRA) 100 MG tablet [id number] Yes Take 1/2 tab in combination  with 10mg  of tadalafil. Bjorn Pippin, MD Taking Active Self  tadalafil (CIALIS) 20 MG tablet [id number] Yes Use either 1 tab po daily prn or 1/2 tab po with 1/2 tab sildenafil 100mg  po daily prn Bjorn Pippin, MD Taking Active Self  Vitamin D,  Ergocalciferol, (DRISDOL) 1.25 MG (50000 UNIT) CAPS capsule [id number] Yes Take 1 capsule (50,000 Units total) by mouth every 7 (seven) days. Gerald Lot, DO Taking Active Self            Home Care and Equipment/Supplies: Were Home Health Services Ordered?: NA Any new equipment or medical supplies ordered?: NA  Functional Questionnaire: Do you need assistance with bathing/showering or dressing?: No Do you need assistance with meal preparation?: Yes Do you need assistance with eating?: No Do you have difficulty maintaining continence: No Do you need assistance with getting out of bed/getting out of a chair/moving?: No Do you have difficulty managing or taking your medications?: No  Follow up appointments reviewed: PCP Follow-up appointment confirmed?: Yes Date of PCP follow-up appointment?: 09/16/23 Follow-up Provider: [facility]  NP Specialist Hospital Follow-up appointment confirmed?: Yes Date of Specialist follow-up appointment?: 09/03/23 Follow-Up Specialty Provider:: Dr Juliann Pares Do you need transportation to your follow-up appointment?: No Do you understand care options if your condition(s) worsen?: Yes-patient verbalized understanding  SDOH Interventions Today    Flowsheet Row Most Recent Value  SDOH Interventions   Food Insecurity Interventions Intervention Not Indicated  Housing Interventions Intervention Not Indicated  Transportation Interventions Intervention Not Indicated, Patient Resources (Friends/Family)  Utilities Interventions Intervention Not Indicated      Interventions Today    Flowsheet Row Most Recent Value  Chronic Disease   Chronic disease during today's visit Other  [non-ST elevated myocardial infarction]  General Interventions   General Interventions Discussed/Reviewed General Interventions Discussed, General Interventions Reviewed, Doctor Visits, Referral to Nurse  Freada Bergeron to Lonia Chimera for 30 day [facility] program]  Doctor Visits  Discussed/Reviewed Doctor Visits Discussed, Doctor Visits Reviewed, PCP, Specialist  PCP/Specialist Visits Compliance with follow-up visit  Pharmacy Interventions   Pharmacy Dicussed/Reviewed Pharmacy Topics Discussed, Pharmacy Topics Reviewed  [facility] discussed the importance of taking the pepcid for acid reflux]       Goals Addressed             This Visit's Progress    TOC Care Plan       Current Barriers: n Knowledge Deficits related to plan of care for management of GERD and   non-ST elevated myocardial infarction   RNCM Clinical Goal(s):  Patient will work with the Care Management team over the next 30 days to address Transition of Care Barriers: Medication Management take all medications exactly as prescribed and will call provider for medication related questions as evidenced by Electronic Health Record attend all scheduled medical appointments: PCP and Specialists as evidenced by Electronic Health Record  through collaboration with RN Care manager, provider, and care team.   Interventions: Evaluation of current treatment plan related to  self management and patient's adherence to plan as established by provider  Transitions of Care:  New goal. Doctor Visits  - discussed the importance of doctor visits Arranged PCP follow-up within 12-14 days (Care Guide Scheduled)  Patient Goals/Self-Care Activities: Participate in Transition of Care Program/Attend [facility] scheduled calls Notify RN Care Manager of TOC call rescheduling needs Take all medications as prescribed Attend all scheduled provider appointments Call pharmacy for medication refills 3-7 days in advance of running out of medications  Follow Up Plan:  Telephone follow up  appointment with care management team member scheduled for:  Lonia Chimera RN Car Coordinator 09/17/2023 11:00 The patient has been provided with contact information for the care management team and has been advised to call with any health related questions or  concerns.  Next PCP appointment scheduled for: 09/16/2023  [facility] NP          Gean Maidens BSN RN [facility] Health Care Management Coordinator [email or web address] Direct Dial: [phone]  Fax: [phone] Website: [email or web address]

## 2023-09-06 NOTE — Telephone Encounter (Signed)
 Chart reviewed  I do not see indication for urgent GI evaluation or to transfer care   I recommend the patient be sen by PCP and then decide if there is a need to be seen sooner by GI

## 2023-09-07 NOTE — Telephone Encounter (Signed)
 Patient advised.

## 2023-09-09 ENCOUNTER — Encounter (INDEPENDENT_AMBULATORY_CARE_PROVIDER_SITE_OTHER): Payer: Self-pay

## 2023-09-16 ENCOUNTER — Encounter: Payer: Self-pay | Admitting: Family Medicine

## 2023-09-16 ENCOUNTER — Ambulatory Visit (INDEPENDENT_AMBULATORY_CARE_PROVIDER_SITE_OTHER): Admitting: Family Medicine

## 2023-09-16 VITALS — BP 154/72 | HR 69 | Ht 68.0 in | Wt 241.8 lb

## 2023-09-16 DIAGNOSIS — K529 Noninfective gastroenteritis and colitis, unspecified: Secondary | ICD-10-CM | POA: Diagnosis not present

## 2023-09-16 NOTE — Progress Notes (Signed)
 Established Patient Office Visit  Subjective:  Patient ID: Gerald Evans, male    DOB: Jul 13, 1945  Age: 77 y.o. MRN: [id number]  CC:  Chief Complaint  Patient presents with   Care Management    HPI Gerald Evans is a 78 y.o. male with past medical history of essential hypertension, acquired hypothyroidism, and chronic kidney disease stage IIIa presents for hospital follow-up.  Gastroenteritis: The patient was seen in the ED on 09/04/2023 for gastroenteritis and was treated with Famotidine 20 mg daily and Zofran 4 mg every 6 hours. Blood work was requested, and a CT scan of the abdomen and pelvis showed findings consistent with a diarrheal illness. The patient was discharged home in stable condition and reports feeling well since discharge. Today, he denies abdominal pain, nausea, vomiting, fever, and chills.   Past Medical History:  Diagnosis Date   CHF (congestive heart failure) (HCC)    Colon polyps    Diabetes mellitus without complication (HCC)    Edema of both lower extremities    Fatty liver    Hypercholesteremia    Hypertension     Past Surgical History:  Procedure Laterality Date   COLONOSCOPY     COLONOSCOPY  04/21/2012   Procedure: COLONOSCOPY;  Surgeon: Malissa Hippo, MD;  Location: [facility];  Service: Endoscopy;  Laterality: N/A;  1200   COLONOSCOPY N/A 09/09/2017   Procedure: COLONOSCOPY;  Surgeon: Malissa Hippo, MD;  Location: [facility];  Service: Endoscopy;  Laterality: N/A;  1200-rescheduled to 3/14/ @12 :00pm per Dewayne Hatch   HERNIA REPAIR     POLYPECTOMY  09/09/2017   Procedure: POLYPECTOMY;  Surgeon: Malissa Hippo, MD;  Location: [facility];  Service: Endoscopy;;  Transverse Colon (CB)    Family History  Problem Relation Age of Onset   Hypertension Mother    Hypertension Father    Alcoholism Father    Obesity Father    Diabetes Sister    Hypertension Sister    Healthy Daughter    Healthy Son    Colon cancer Neg Hx     Social History    Socioeconomic History   Marital status: Married    Spouse name: Not on file   Number of children: 2   Years of education: Not on file   Highest education level: Not on file  Occupational History   Occupation: retired  Tobacco Use   Smoking status: Former    Current packs/day: 0.50    Average packs/day: 0.5 packs/day for 20.0 years (10.0 ttl pk-yrs)    Types: Cigarettes   Smokeless tobacco: Never  Vaping Use   Vaping status: Never Used  Substance and Sexual Activity   Alcohol use: Not Currently   Drug use: No   Sexual activity: Not on file  Other Topics Concern   Not on file  Social History Narrative   Not on file   Social Drivers of Health   Financial Resource Strain: Low Risk  (11/18/2022)   Overall Financial Resource Strain (CARDIA)    Difficulty of Paying Living Expenses: Not hard at all  Food Insecurity: No Food Insecurity (09/06/2023)   Hunger Vital Sign    Worried About Running Out of Food in the Last Year: Never true    Ran Out of Food in the Last Year: Never true  Transportation Needs: No Transportation Needs (09/06/2023)   PRAPARE - Administrator, Civil Service (Medical): No    Lack of Transportation (Non-Medical): No  Physical Activity: Sufficiently Active (11/18/2022)   Exercise Vital Sign    Days of Exercise per Week: 7 days    Minutes of Exercise per Session: 30 min  Stress: No Stress Concern Present (11/18/2022)   [employer] of Occupational Health - Occupational Stress Questionnaire    Feeling of Stress : Not at all  Social Connections: Socially Integrated (09/02/2023)   Social Connection and Isolation Panel [NHANES]    Frequency of Communication with Friends and Family: More than three times a week    Frequency of Social Gatherings with Friends and Family: More than three times a week    Attends Religious Services: More than 4 times per year    Active Member of [employer] or Organizations: Yes    Attends Engineer, structural: More  than 4 times per year    Marital Status: Married  Catering manager Violence: Not At Risk (09/06/2023)   Humiliation, Afraid, Rape, and Kick questionnaire    Fear of Current or Ex-Partner: No    Emotionally Abused: No    Physically Abused: No    Sexually Abused: No    Outpatient Medications Prior to Visit  Medication Sig Dispense Refill   atorvastatin (LIPITOR) 40 MG tablet Take 1 tablet (40 mg total) by mouth daily. 90 tablet 3   blood glucose meter kit and supplies Dispense based on patient and insurance preference. Use up to four times daily as directed. (FOR ICD-10 E11.65) 1 each 0   chlorthalidone (HYGROTON) 25 MG tablet Take 1 tablet (25 mg total) by mouth daily. 90 tablet 3   famotidine (PEPCID) 20 MG tablet Take 1 tablet (20 mg total) by mouth daily. 30 tablet 0   glucose blood (ONETOUCH ULTRA) test strip USE 1 STRIP TO CHECK GLUCOSE THREE TIMES DAILY AS DIRECTED E11.65 300 each 1   hydrALAZINE (APRESOLINE) 50 MG tablet TAKE 1 TABLET BY MOUTH THREE TIMES DAILY 90 tablet 0   insulin aspart protamine - aspart (NOVOLOG 70/30 FLEXPEN) (70-30) 100 UNIT/ML FlexPen Inject 40 Units into the skin 2 (two) times daily with a meal. 75 mL 3   Insulin Pen Needle (BD PEN NEEDLE NANO 2ND GEN) 32G X 4 MM MISC 1 Package by Does not apply route 2 (two) times daily. 100 each 0   levothyroxine (SYNTHROID) 75 MCG tablet Take 1 tablet (75 mcg total) by mouth daily before breakfast. 90 tablet 1   losartan (COZAAR) 100 MG tablet Take 1 tablet (100 mg total) by mouth daily. 90 tablet 0   metFORMIN (GLUCOPHAGE) 1000 MG tablet Take 1 tablet (1,000 mg total) by mouth daily with breakfast. 90 tablet 3   metoprolol tartrate (LOPRESSOR) 100 MG tablet Take 1 tablet (100 mg total) by mouth 2 (two) times daily. 180 tablet 0   OneTouch Delica Lancets 30G MISC 1 each by Does not apply route 3 (three) times daily. Use to check blood glucose three times daily 100 each 3   sildenafil (VIAGRA) 100 MG tablet Take 1/2 tab in  combination with 10mg  of tadalafil. 30 tablet 11   tadalafil (CIALIS) 20 MG tablet Use either 1 tab po daily prn or 1/2 tab po with 1/2 tab sildenafil 100mg  po daily prn 6 tablet 11   Vitamin D, Ergocalciferol, (DRISDOL) 1.25 MG (50000 UNIT) CAPS capsule Take 1 capsule (50,000 Units total) by mouth every 7 (seven) days. 12 capsule 0   ondansetron (ZOFRAN) 4 MG tablet Take 1 tablet (4 mg total) by mouth every 6 (six) hours. (Patient  not taking: Reported on 09/16/2023) 12 tablet 0   No facility-administered medications prior to visit.    Allergies  Allergen Reactions   Lisinopril Cough    ROS Review of Systems  Constitutional:  Negative for fatigue and fever.  Eyes:  Negative for visual disturbance.  Respiratory:  Negative for chest tightness and shortness of breath.   Cardiovascular:  Negative for chest pain and palpitations.  Neurological:  Negative for dizziness and headaches.      Objective:    Physical Exam HENT:     Head: Normocephalic.     Right Ear: External ear normal.     Left Ear: External ear normal.     Nose: No congestion or rhinorrhea.     Mouth/Throat:     Mouth: Mucous membranes are moist.  Cardiovascular:     Rate and Rhythm: Regular rhythm.     Heart sounds: No murmur heard. Pulmonary:     Effort: No respiratory distress.     Breath sounds: Normal breath sounds.  Neurological:     Mental Status: He is alert.     BP (!) 154/72 (BP Location: Right Arm, Patient Position: Sitting, Cuff Size: Large)   Pulse 69   Ht 5\' 8"  (1.727 m)   Wt 241 lb 12.8 oz (109.7 kg)   SpO2 96%   BMI 36.77 kg/m  Wt Readings from Last 3 Encounters:  09/16/23 241 lb 12.8 oz (109.7 kg)  09/04/23 246 lb 4.1 oz (111.7 kg)  09/02/23 246 lb 4.8 oz (111.7 kg)    Lab Results  Component Value Date   TSH 2.080 07/16/2023   Lab Results  Component Value Date   WBC 9.5 09/04/2023   HGB 14.0 09/04/2023   HCT 44.0 09/04/2023   MCV 73.0 (L) 09/04/2023   PLT 249 09/04/2023    Lab Results  Component Value Date   NA 138 09/04/2023   K 4.1 09/04/2023   CO2 25 09/04/2023   GLUCOSE 154 (H) 09/04/2023   BUN 18 09/04/2023   CREATININE 1.30 (H) 09/04/2023   BILITOT 0.8 09/04/2023   ALKPHOS 80 09/04/2023   AST 27 09/04/2023   ALT 24 09/04/2023   PROT 8.1 09/04/2023   ALBUMIN 4.2 09/04/2023   CALCIUM 10.3 09/04/2023   ANIONGAP 12 09/04/2023   EGFR 63 07/16/2023   Lab Results  Component Value Date   CHOL 114 09/03/2023   Lab Results  Component Value Date   HDL 38 (L) 09/03/2023   Lab Results  Component Value Date   LDLCALC 55 09/03/2023   Lab Results  Component Value Date   TRIG 103 09/03/2023   Lab Results  Component Value Date   CHOLHDL 3.0 09/03/2023   Lab Results  Component Value Date   HGBA1C 9.3 (A) 07/20/2023      Assessment & Plan:  Gastroenteritis Assessment & Plan: Labs and imaging studies reviewed Pending BMP Encouraged to continue current treatment and follow up as needed  Orders: -     BMP8+EGFR   Note: This chart has been completed using Engineer, civil (consulting) software, and while attempts have been made to ensure accuracy, certain words and phrases may not be transcribed as intended.    Follow-up: No follow-ups on file.   Gilmore Laroche, FNP

## 2023-09-16 NOTE — Assessment & Plan Note (Signed)
 Labs and imaging studies reviewed Pending BMP Encouraged to continue current treatment and follow up as needed

## 2023-09-16 NOTE — Patient Instructions (Addendum)
 I appreciate the opportunity to provide care to you today!    Labs: please stop by the lab today to get your blood drawn (BMP)    Attached with your AVS, you will find valuable resources for self-education. I highly recommend dedicating some time to thoroughly examine them.   Please continue to a heart-healthy diet and increase your physical activities. Try to exercise for at least five days a week.    It was a pleasure to see you and I look forward to continuing to work together on your health and well-being. Please do not hesitate to call the office if you need care or have questions about your care.  In case of emergency, please visit the Emergency Department for urgent care, or contact our clinic at [phone] to schedule an appointment. We're here to help you!   Have a wonderful day and week. With Gratitude, Gilmore Laroche MSN, FNP-BC

## 2023-09-17 ENCOUNTER — Other Ambulatory Visit: Payer: Self-pay

## 2023-09-17 LAB — BMP8+EGFR
BUN/Creatinine Ratio: 14 (ref 10–24)
BUN: 27 mg/dL (ref 8–27)
CO2: 21 mmol/L (ref 20–29)
Calcium: 9.3 mg/dL (ref 8.6–10.2)
Chloride: 101 mmol/L (ref 96–106)
Creatinine, Ser: 1.89 mg/dL — ABNORMAL HIGH (ref 0.76–1.27)
Glucose: 185 mg/dL — ABNORMAL HIGH (ref 70–99)
Potassium: 3.5 mmol/L (ref 3.5–5.2)
Sodium: 140 mmol/L (ref 134–144)
eGFR: 36 mL/min/{1.73_m2} — ABNORMAL LOW (ref >59)

## 2023-09-17 NOTE — Patient Outreach (Signed)
 Care Management  Transitions of Care Program Transitions of Care Post-discharge week 2  09/17/2023 Name: Gerald Evans MRN: [id number] DOB: 22-Jan-1946  Subjective: Gerald Evans is a 78 y.o. year old male who is a primary care patient of Billie Lade, MD. The Care Management team was unable to reach the patient by phone to assess and address transitions of care needs.   Plan: Additional outreach attempts will be made to reach the patient enrolled in the [facility] (Post Inpatient/ED Visit).  Lonia Chimera, RN, BSN, CEN [employer]- Transition of Care Team.  [facility] [phone]

## 2023-09-18 ENCOUNTER — Encounter: Payer: Self-pay | Admitting: Family Medicine

## 2023-09-20 ENCOUNTER — Telehealth: Payer: Self-pay

## 2023-09-20 NOTE — Patient Outreach (Signed)
 Care Management  Transitions of Care Program Transitions of Care Post-discharge week 2   09/20/2023 Name: Gerald Evans MRN: [id number] DOB: 1945/08/29  Subjective: Gerald Evans is a 78 y.o. year old male who is a primary care patient of Billie Lade, MD. The Care Management team Engaged with patient Engaged with patient by telephone to assess and address transitions of care needs.   Consent to Services:  Patient was given information about care management services, agreed to services, and gave verbal consent to participate.   Assessment: 2nd outreach attempt to patient. He reports that he is busy right now and can not talk. Reviewed TOC program and patient agreed to call tomorrow.           SDOH Interventions    Flowsheet Row Telephone from 09/06/2023 in [place] POPULATION HEALTH DEPARTMENT Clinical Support from 11/18/2022 in [facility]  SDOH Interventions    Food Insecurity Interventions Intervention Not Indicated Intervention Not Indicated  Housing Interventions Intervention Not Indicated Intervention Not Indicated  Transportation Interventions Intervention Not Indicated, Patient Resources (Friends/Family) Intervention Not Indicated  Utilities Interventions Intervention Not Indicated Intervention Not Indicated  Alcohol Usage Interventions -- Intervention Not Indicated (Score <7)  Financial Strain Interventions -- Intervention Not Indicated  Physical Activity Interventions -- Intervention Not Indicated  Stress Interventions -- Intervention Not Indicated  Social Connections Interventions -- Intervention Not Indicated          Plan: Telephone follow up appointment with care management team member scheduled for: 09/21/2023  Lonia Chimera, RN, BSN, CEN Population Health- Transition of Care Team.  [facility] [phone]

## 2023-09-21 ENCOUNTER — Other Ambulatory Visit: Payer: Self-pay

## 2023-09-21 NOTE — Patient Outreach (Signed)
 Care Management  Transitions of Care Program Transitions of Care Post-discharge week 2   09/21/2023 Name: Gerald Evans MRN: [id number] DOB: June 04, 1946  Subjective: Gerald Evans is a 78 y.o. year old male who is a primary care patient of Billie Lade, MD. The Care Management team Engaged with patient Engaged with patient by telephone to assess and address transitions of care needs.   Consent to Services:  Patient was given information about care management services, agreed to services, and gave verbal consent to participate.   Assessment: Patient reports that he is doing well. Reports no new problems or concerns. States that he has all his medications and is taking his medications as prescribed.  Reports he is self managing well and denies need for additional calls.           SDOH Interventions    Flowsheet Row Telephone from 09/06/2023 in [place] POPULATION HEALTH DEPARTMENT Clinical Support from 11/18/2022 in [facility]  SDOH Interventions    Food Insecurity Interventions Intervention Not Indicated Intervention Not Indicated  Housing Interventions Intervention Not Indicated Intervention Not Indicated  Transportation Interventions Intervention Not Indicated, Patient Resources (Friends/Family) Intervention Not Indicated  Utilities Interventions Intervention Not Indicated Intervention Not Indicated  Alcohol Usage Interventions -- Intervention Not Indicated (Score <7)  Financial Strain Interventions -- Intervention Not Indicated  Physical Activity Interventions -- Intervention Not Indicated  Stress Interventions -- Intervention Not Indicated  Social Connections Interventions -- Intervention Not Indicated        Goals Addressed             This Visit's Progress    COMPLETED: TOC Care Plan       Current Barriers: n Knowledge Deficits related to plan of care for management of GERD and   non-ST elevated myocardial infarction 09/21/2023  Patient  reports that he is doing well and denies any needs.   RNCM Clinical Goal(s):  Patient will work with the Care Management team over the next 30 days to address Transition of Care Barriers: Medication Management take all medications exactly as prescribed and will call provider for medication related questions as evidenced by Electronic Health Record attend all scheduled medical appointments: PCP and Specialists as evidenced by Electronic Health Record  through collaboration with RN Care manager, provider, and care team.   Interventions: Evaluation of current treatment plan related to  self management and patient's adherence to plan as established by provider  Transitions of Care:  Goal Met- denies any additional needs  Doctor Visits  - discussed the importance of doctor visits Arranged PCP follow-up within 12-14 days (Care Guide Scheduled)  Patient Goals/Self-Care Activities: Take all medications as prescribed Attend all scheduled provider appointments Call pharmacy for medication refills 3-7 days in advance of running out of medications  Follow Up Plan:  The patient has been provided with contact information for the care management team and has been advised to call with any health related questions or concerns.  Patient denies any additional needs at this time. Denies any additional calls.  Case closed.         Plan:  patient denies any additional calls. Case closed. Patient to follow up with PCP for any additional concerns  Lonia Chimera, RN, BSN, [employer]- Transition of Care Team.  [facility] [phone]

## 2023-10-20 ENCOUNTER — Other Ambulatory Visit: Payer: Self-pay | Admitting: Internal Medicine

## 2023-10-20 DIAGNOSIS — I10 Essential (primary) hypertension: Secondary | ICD-10-CM

## 2023-10-21 ENCOUNTER — Encounter: Payer: Self-pay | Admitting: Nurse Practitioner

## 2023-10-21 ENCOUNTER — Ambulatory Visit: Payer: Medicare HMO | Admitting: Nurse Practitioner

## 2023-10-21 VITALS — BP 118/66 | HR 68 | Ht 68.0 in | Wt 248.8 lb

## 2023-10-21 DIAGNOSIS — E782 Mixed hyperlipidemia: Secondary | ICD-10-CM

## 2023-10-21 DIAGNOSIS — Z794 Long term (current) use of insulin: Secondary | ICD-10-CM

## 2023-10-21 DIAGNOSIS — I10 Essential (primary) hypertension: Secondary | ICD-10-CM | POA: Diagnosis not present

## 2023-10-21 DIAGNOSIS — E1165 Type 2 diabetes mellitus with hyperglycemia: Secondary | ICD-10-CM | POA: Diagnosis not present

## 2023-10-21 DIAGNOSIS — Z7985 Long-term (current) use of injectable non-insulin antidiabetic drugs: Secondary | ICD-10-CM

## 2023-10-21 DIAGNOSIS — Z7984 Long term (current) use of oral hypoglycemic drugs: Secondary | ICD-10-CM

## 2023-10-21 DIAGNOSIS — E039 Hypothyroidism, unspecified: Secondary | ICD-10-CM

## 2023-10-21 LAB — POCT GLYCOSYLATED HEMOGLOBIN (HGB A1C): Hemoglobin A1C: 8.2 % — AB (ref 4.0–5.6)

## 2023-10-21 MED ORDER — NOVOLOG 70/30 FLEXPEN RELION (70-30) 100 UNIT/ML ~~LOC~~ SUPN: 40.0000 [IU] | PEN_INJECTOR | Freq: Two times a day (BID) | SUBCUTANEOUS | 3 refills | Status: AC

## 2023-10-21 NOTE — Progress Notes (Signed)
 10/21/2023, 11:18 AM  Endocrinology follow-up note   Subjective:    Patient ID: Gerald Evans, male    DOB: 23-Feb-1946.  Gerald Evans is being seen in follow-up after he was seen in consultation for management of currently uncontrolled symptomatic diabetes requested by  Tobi Fortes, MD.   Past Medical History:  Diagnosis Date   CHF (congestive heart failure) (HCC)    Colon polyps    Diabetes mellitus without complication (HCC)    Edema of both lower extremities    Fatty liver    Hypercholesteremia    Hypertension     Past Surgical History:  Procedure Laterality Date   COLONOSCOPY     COLONOSCOPY  04/21/2012   Procedure: COLONOSCOPY;  Surgeon: Ruby Corporal, MD;  Location: [facility];  Service: Endoscopy;  Laterality: N/A;  1200   COLONOSCOPY N/A 09/09/2017   Procedure: COLONOSCOPY;  Surgeon: Ruby Corporal, MD;  Location: [facility];  Service: Endoscopy;  Laterality: N/A;  1200-rescheduled to 3/14/ @12 :00pm per Lorelee Roger   HERNIA REPAIR     POLYPECTOMY  09/09/2017   Procedure: POLYPECTOMY;  Surgeon: Ruby Corporal, MD;  Location: [facility];  Service: Endoscopy;;  Transverse Colon (CB)    Social History   Socioeconomic History   Marital status: Married    Spouse name: Not on file   Number of children: 2   Years of education: Not on file   Highest education level: Not on file  Occupational History   Occupation: retired  Tobacco Use   Smoking status: Former    Current packs/day: 0.50    Average packs/day: 0.5 packs/day for 20.0 years (10.0 ttl pk-yrs)    Types: Cigarettes   Smokeless tobacco: Never  Vaping Use   Vaping status: Never Used  Substance and Sexual Activity   Alcohol  use: Not Currently   Drug use: No   Sexual activity: Not on file  Other Topics Concern   Not on file  Social History Narrative   Not on file   Social Drivers of Health   Financial  Resource Strain: Low Risk  (11/18/2022)   Overall Financial Resource Strain (CARDIA)    Difficulty of Paying Living Expenses: Not hard at all  Food Insecurity: No Food Insecurity (09/06/2023)   Hunger Vital Sign    Worried About Running Out of Food in the Last Year: Never true    Ran Out of Food in the Last Year: Never true  Transportation Needs: No Transportation Needs (09/06/2023)   PRAPARE - Administrator, Civil Service (Medical): No    Lack of Transportation (Non-Medical): No  Physical Activity: Sufficiently Active (11/18/2022)   Exercise Vital Sign    Days of Exercise per Week: 7 days    Minutes of Exercise per Session: 30 min  Stress: No Stress Concern Present (11/18/2022)   [employer] of Occupational Health - Occupational Stress Questionnaire    Feeling of Stress : Not at all  Social Connections: Socially Integrated (09/02/2023)   Social Connection and Isolation Panel [NHANES]    Frequency of Communication with Friends and Family: More than three times  a week    Frequency of Social Gatherings with Friends and Family: More than three times a week    Attends Religious Services: More than 4 times per year    Active Member of [employer] or Organizations: Yes    Attends Engineer, structural: More than 4 times per year    Marital Status: Married    Family History  Problem Relation Age of Onset   Hypertension Mother    Hypertension Father    Alcoholism Father    Obesity Father    Diabetes Sister    Hypertension Sister    Healthy Daughter    Healthy Son    Colon cancer Neg Hx     Outpatient Encounter Medications as of 10/21/2023  Medication Sig   atorvastatin  (LIPITOR) 40 MG tablet Take 1 tablet (40 mg total) by mouth daily.   blood glucose meter kit and supplies Dispense based on patient and insurance preference. Use up to four times daily as directed. (FOR ICD-10 E11.65)   chlorthalidone  (HYGROTON ) 25 MG tablet Take 1 tablet (25 mg total) by mouth daily.    famotidine  (PEPCID ) 20 MG tablet Take 1 tablet (20 mg total) by mouth daily.   glucose blood (ONETOUCH ULTRA) test strip USE 1 STRIP TO CHECK GLUCOSE THREE TIMES DAILY AS DIRECTED E11.65   hydrALAZINE  (APRESOLINE ) 50 MG tablet TAKE 1 TABLET BY MOUTH THREE TIMES DAILY   insulin  aspart protamine - aspart (NOVOLOG  70/30 FLEXPEN) (70-30) 100 UNIT/ML FlexPen Inject 40 Units into the skin 2 (two) times daily with a meal.   Insulin  Pen Needle (BD PEN NEEDLE NANO 2ND GEN) 32G X 4 MM MISC 1 Package by Does not apply route 2 (two) times daily.   levothyroxine  (SYNTHROID ) 75 MCG tablet Take 1 tablet (75 mcg total) by mouth daily before breakfast.   losartan  (COZAAR ) 100 MG tablet Take 1 tablet (100 mg total) by mouth daily.   metoprolol  tartrate (LOPRESSOR ) 100 MG tablet Take 1 tablet (100 mg total) by mouth 2 (two) times daily.   ondansetron  (ZOFRAN ) 4 MG tablet Take 1 tablet (4 mg total) by mouth every 6 (six) hours. (Patient not taking: Reported on 09/16/2023)   OneTouch Delica Lancets 30G MISC 1 each by Does not apply route 3 (three) times daily. Use to check blood glucose three times daily   sildenafil  (VIAGRA ) 100 MG tablet Take 1/2 tab in combination with 10mg  of tadalafil .   tadalafil  (CIALIS ) 20 MG tablet Use either 1 tab po daily prn or 1/2 tab po with 1/2 tab sildenafil  100mg  po daily prn   Vitamin D , Ergocalciferol , (DRISDOL ) 1.25 MG (50000 UNIT) CAPS capsule Take 1 capsule (50,000 Units total) by mouth every 7 (seven) days.   [DISCONTINUED] hydrALAZINE  (APRESOLINE ) 50 MG tablet TAKE 1 TABLET BY MOUTH THREE TIMES DAILY   [DISCONTINUED] insulin  aspart protamine - aspart (NOVOLOG  70/30 FLEXPEN) (70-30) 100 UNIT/ML FlexPen Inject 40 Units into the skin 2 (two) times daily with a meal.   [DISCONTINUED] metFORMIN  (GLUCOPHAGE ) 1000 MG tablet Take 1 tablet (1,000 mg total) by mouth daily with breakfast.   No facility-administered encounter medications on file as of 10/21/2023.    ALLERGIES: Allergies   Allergen Reactions   Lisinopril Cough    VACCINATION STATUS: Immunization History  Administered Date(s) Administered   Fluad Trivalent(High Dose 65+) 05/07/2023   PFIZER Comirnaty(Gray Top)Covid-19 Tri-Sucrose Vaccine 11/12/2020   PFIZER(Purple Top)SARS-COV-2 Vaccination 08/20/2019, 09/12/2019, 04/25/2020   PNEUMOCOCCAL CONJUGATE-20 01/06/2022   Pfizer Covid-19 Vaccine Bivalent Booster 38yrs &  up 07/17/2021   Pneumococcal Polysaccharide-23 05/10/2018   Tdap 12/20/2017   Zoster Recombinant(Shingrix) 01/06/2022, 03/18/2022    Diabetes He presents for his follow-up diabetic visit. He has type 2 diabetes mellitus. Onset time: He was diagnosed at approximate age of 11 years. His disease course has been improving. There are no hypoglycemic associated symptoms. Pertinent negatives for hypoglycemia include no confusion, headaches, nervousness/anxiousness, pallor or seizures. Associated symptoms include fatigue. Pertinent negatives for diabetes include no chest pain, no polydipsia, no polyphagia, no polyuria, no weakness and no weight loss. There are no hypoglycemic complications. Symptoms are stable. Diabetic complications include heart disease, impotence and nephropathy. Risk factors for coronary artery disease include dyslipidemia, diabetes mellitus, family history, tobacco exposure, sedentary lifestyle, male sex, obesity and hypertension. Current diabetic treatment includes oral agent (monotherapy) and insulin  injections (and Ozempic ). He is compliant with treatment most of the time. His weight is fluctuating minimally. He is following a generally unhealthy diet. When asked about meal planning, he reported none. He has not had a previous visit with a dietitian. He participates in exercise three times a week. His home blood glucose trend is decreasing steadily. His overall blood glucose range is 140-180 mg/dl. (He presents today with his meter, no logs, showing inconsistent glucose monitoring (only  checking glucose on average once daily), with above target glycemic profile- slowly improving.  His POCT A1c today is 8.2%, improving from last visit of 9.3%.  Analysis of his meter shows 7-day average of 55, 14-day average of 157, 30-day average of 161.  ) An ACE inhibitor/angiotensin II receptor blocker is being taken. He does not see a podiatrist.Eye exam is current.  Hyperlipidemia This is a chronic problem. The current episode started more than 1 year ago. The problem is controlled. Recent lipid tests were reviewed and are normal. Exacerbating diseases include chronic renal disease, diabetes and obesity. Factors aggravating his hyperlipidemia include fatty foods, beta blockers and thiazides. Pertinent negatives include no chest pain, myalgias or shortness of breath. Current antihyperlipidemic treatment includes statins. The current treatment provides mild improvement of lipids. Compliance problems include adherence to diet and adherence to exercise.  Risk factors for coronary artery disease include diabetes mellitus, dyslipidemia, hypertension, male sex, obesity, a sedentary lifestyle and family history.  Hypertension This is a chronic problem. The current episode started more than 1 year ago. The problem has been waxing and waning since onset. The problem is uncontrolled. Pertinent negatives include no chest pain, headaches, neck pain, palpitations or shortness of breath. There are no associated agents to hypertension. Risk factors for coronary artery disease include dyslipidemia, diabetes mellitus, male gender, obesity and smoking/tobacco exposure. Past treatments include angiotensin blockers, diuretics, beta blockers and central alpha agonists. The current treatment provides mild improvement. There are no compliance problems.  Hypertensive end-organ damage includes kidney disease and CAD/MI. Identifiable causes of hypertension include chronic renal disease and a thyroid problem.  Thyroid  Problem Presents for initial visit. Symptoms include fatigue and weight gain. Patient reports no anxiety, cold intolerance, constipation, depressed mood, hair loss, leg swelling, palpitations or weight loss. The symptoms have been stable. Past treatments include nothing. His past medical history is significant for diabetes, hyperlipidemia and obesity. There are no known risk factors.    Review of systems  Constitutional: + minimally fluctuating body weight,  current Body mass index is 37.83 kg/m. , no fatigue, no subjective hyperthermia, no subjective hypothermia Eyes: no blurry vision, no xerophthalmia ENT: no sore throat, no nodules palpated in throat, no dysphagia/odynophagia,  no hoarseness Cardiovascular: no chest pain, no shortness of breath, no palpitations, no leg swelling Respiratory: no cough, no shortness of breath Gastrointestinal: no nausea/vomiting/diarrhea Musculoskeletal: no muscle/joint aches Skin: no rashes, no hyperemia Neurological: no tremors, no numbness, no tingling, no dizziness Psychiatric: no depression, no anxiety  Objective:    BP 118/66   Pulse 68   Ht 5\' 8"  (1.727 m)   Wt 248 lb 12.8 oz (112.9 kg)   BMI 37.83 kg/m   Wt Readings from Last 3 Encounters:  10/21/23 248 lb 12.8 oz (112.9 kg)  09/16/23 241 lb 12.8 oz (109.7 kg)  09/04/23 246 lb 4.1 oz (111.7 kg)    BP Readings from Last 3 Encounters:  10/21/23 118/66  09/16/23 (!) 154/72  09/04/23 (!) 142/72     Physical Exam- Limited  Constitutional:  Body mass index is 37.83 kg/m. , not in acute distress, normal state of mind Eyes:  EOMI, no exophthalmos Musculoskeletal: no gross deformities, strength intact in all four extremities, no gross restriction of joint movements Skin:  no rashes, no hyperemia Neurological: no tremor with outstretched hands  Diabetic Foot Exam - Simple   Simple Foot Form Diabetic Foot exam was performed with the following findings: Yes 10/21/2023 11:08 AM  Visual  Inspection No deformities, no ulcerations, no other skin breakdown bilaterally: Yes Sensation Testing Intact to touch and monofilament testing bilaterally: Yes Pulse Check Posterior Tibialis and Dorsalis pulse intact bilaterally: Yes Comments     CMP ( most recent) CMP     Component Value Date/Time   NA 140 09/16/2023 1334   K 3.5 09/16/2023 1334   CL 101 09/16/2023 1334   CO2 21 09/16/2023 1334   GLUCOSE 185 (H) 09/16/2023 1334   GLUCOSE 154 (H) 09/04/2023 1413   BUN 27 09/16/2023 1334   CREATININE 1.89 (H) 09/16/2023 1334   CREATININE 1.00 03/18/2020 1321   CALCIUM  9.3 09/16/2023 1334   PROT 8.1 09/04/2023 1413   PROT 6.5 07/16/2023 1301   ALBUMIN 4.2 09/04/2023 1413   ALBUMIN 4.0 07/16/2023 1301   AST 27 09/04/2023 1413   ALT 24 09/04/2023 1413   ALKPHOS 80 09/04/2023 1413   BILITOT 0.8 09/04/2023 1413   BILITOT 0.4 07/16/2023 1301   GFRNONAA 57 (L) 09/04/2023 1413   GFRNONAA 74 03/18/2020 1321   GFRAA 67 07/17/2020 0953   GFRAA 86 03/18/2020 1321    Diabetic Labs (most recent): Lab Results  Component Value Date   HGBA1C 9.3 (A) 07/20/2023   HGBA1C 9.4 (A) 04/15/2023   HGBA1C 8.2 (A) 01/13/2023   MICROALBUR 80mg /L 04/15/2023   MICROALBUR 80 mg/l 05/11/2022   MICROALBUR 150 04/15/2021     Assessment & Plan:   1) Uncontrolled type 2 diabetes mellitus with hyperglycemia (HCC)  - Gerald Evans has currently uncontrolled symptomatic type 2 DM since  78 years of age.  He presents today with his meter, no logs, showing inconsistent glucose monitoring (only checking glucose on average once daily), with above target glycemic profile- slowly improving.  His POCT A1c today is 8.2%, improving from last visit of 9.3%.  Analysis of his meter shows 7-day average of 55, 14-day average of 157, 30-day average of 161.     Recent labs reviewed.  Kidney function is declining some, will stop Metformin  and recheck prior to next visit.  - I had a long discussion with him  about the progressive nature of diabetes and the pathology behind its complications.  -his diabetes is complicated by obesity/sedentary life  and he remains at a high risk for more acute and chronic complications which include CAD, CVA, CKD, retinopathy, and neuropathy. These are all discussed in detail with him.  - Nutritional counseling repeated at each appointment due to patients tendency to fall back in to old habits.  - The patient admits there is a room for improvement in their diet and drink choices. -  Suggestion is made for the patient to avoid simple carbohydrates from their diet including Cakes, Sweet Desserts / Pastries, Ice Cream, Soda (diet and regular), Sweet Tea, Candies, Chips, Cookies, Sweet Pastries, Store Bought Juices, Alcohol  in Excess of 1-2 drinks a day, Artificial Sweeteners, Coffee Creamer, and "Sugar-free" Products. This will help patient to have stable blood glucose profile and potentially avoid unintended weight gain.   - I encouraged the patient to switch to unprocessed or minimally processed complex starch and increased protein intake (animal or plant source), fruits, and vegetables.   - Patient is advised to stick to a routine mealtimes to eat 3 meals a day and avoid unnecessary snacks (to snack only to correct hypoglycemia).  - I have approached him with the following individualized plan to manage  his diabetes and patient agrees:   - he will continue to require insulin  treatment in order for him to achieve and maintain control of diabetes to target.    -He is benefiting from simplified insulin  regimen with premixed insulin .    -He is advised to continue his 70/30 40 units SQ BID with meals if glucose is above 90 and he is eating and will increase his Ozempic  to 1 mg SQ weekly (when he gets this from [facility]).  He notes he is on his last pen of 70/30.  I did send some to local pharmacy just in case we are unable to get his [facility] meds here soon enough.  -He is encouraged  to do better monitoring his blood glucose.  He is urged to monitor glucose at least twice per day, before injecting insulin  (at breakfast and supper) and call the clinic if readings are less than 70 or greater than 300 for 3 tests in a row.  He could benefit from CGM device but is not interested at this time.  - Specific targets for  A1c;  LDL, HDL,  and Triglycerides were discussed with the patient.  2) Blood Pressure /Hypertension:  His blood pressure is controlled to target for his age.  He is advised to continue Clonidine  0.2 mg po twice daily, HCTZ 25 mg po daily, Losartan  100 mg po daily, and Metoprolol  100 mg po twice daily.    3) Lipids/Hyperlipidemia:  His most recent lipid panel from 09/03/23 shows controlled LDL of 55.  He is advised to continue Simvastatin  20 mg po daily at bedtime.  Side effects and precautions discussed with him.    4)  Weight/Diet:  His Body mass index is 37.83 kg/m.  -   clearly complicating his diabetes care.   he is  a candidate for weight loss. I discussed with him the fact that loss of 5 - 10% of his  current body weight will have the most impact on his diabetes management.  Exercise, and detailed carbohydrates information provided  -  detailed on discharge instructions.  5) Hypothyroidism-acquired He does not have personal or family history of thyroid dysfunction.  His antibody testing was negative ruling out autoimmune thyroid dysfunction.    There are no recent TFTs to review.  He is advised to continue his  Levothyroxine  75 mcg po daily before breakfast.  Will recheck TFTs prior to next visit and adjust accordingly.   - The correct intake of thyroid hormone (Levothyroxine , Synthroid ), is on empty stomach first thing in the morning, with water , separated by at least 30 minutes from breakfast and other medications,  and separated by more than 4 hours from calcium , iron, multivitamins, acid reflux medications (PPIs).  - This medication is a life-long medication  and will be needed to correct thyroid hormone imbalances for the rest of your life.  The dose may change from time to time, based on thyroid blood work.  - It is extremely important to be consistent taking this medication, near the same time each morning.  -AVOID TAKING PRODUCTS CONTAINING BIOTIN (commonly found in Hair, Skin, Nails vitamins) AS IT INTERFERES WITH THE VALIDITY OF THYROID FUNCTION BLOOD TESTS.  6) Chronic Care/Health Maintenance: -he is on ACEI/ARB and Statin medications and is encouraged to initiate and continue to follow up with Ophthalmology, Dentist,  Podiatrist at least yearly or according to recommendations, and advised to  stay away from smoking. I have recommended yearly flu vaccine and pneumonia vaccine at least every 5 years; moderate intensity exercise for up to 150 minutes weekly; and  sleep for at least 7 hours a day.  - he is advised to maintain close follow up with Kermit Ped, Heath Litten, MD for primary care needs, as well as his other providers for optimal and coordinated care.     I spent  47  minutes in the care of the patient today including review of labs from CMP, Lipids, Thyroid Function, Hematology (current and previous including abstractions from other facilities); face-to-face time discussing  his blood glucose readings/logs, discussing hypoglycemia and hyperglycemia episodes and symptoms, medications doses, his options of short and long term treatment based on the latest standards of care / guidelines;  discussion about incorporating lifestyle medicine;  and documenting the encounter. Risk reduction counseling performed per USPSTF guidelines to reduce obesity and cardiovascular risk factors.     Please refer to Patient Instructions for Blood Glucose Monitoring and Insulin /Medications Dosing Guide"  in media tab for additional information. Please  also refer to " Patient Self Inventory" in the Media  tab for reviewed elements of pertinent patient history.  Gerald Evans participated in the discussions, expressed understanding, and voiced agreement with the above plans.  All questions were answered to his satisfaction. he is encouraged to contact clinic should he have any questions or concerns prior to his return visit.   Follow up plan: - Return in about 4 months (around 02/20/2024) for Diabetes F/U with A1c in office, Thyroid follow up, Previsit labs, Bring meter and logs.  Hulon Magic, [facility] [address] [place], [place] [address] Phone: [phone] Fax: [phone]  10/21/2023, 11:18 AM

## 2023-11-04 ENCOUNTER — Telehealth: Payer: Self-pay | Admitting: Nurse Practitioner

## 2023-11-04 NOTE — Telephone Encounter (Signed)
 Called pt to let him know Novo Nordisk is here for p/u - Pen needles, novolog  & ozempic 

## 2023-11-05 ENCOUNTER — Encounter (INDEPENDENT_AMBULATORY_CARE_PROVIDER_SITE_OTHER): Payer: Self-pay | Admitting: *Deleted

## 2023-11-05 ENCOUNTER — Telehealth: Payer: Self-pay | Admitting: *Deleted

## 2023-11-05 ENCOUNTER — Encounter: Payer: Self-pay | Admitting: Internal Medicine

## 2023-11-05 ENCOUNTER — Ambulatory Visit (INDEPENDENT_AMBULATORY_CARE_PROVIDER_SITE_OTHER): Payer: Medicare HMO | Admitting: Internal Medicine

## 2023-11-05 VITALS — BP 127/66 | HR 74 | Ht 68.0 in | Wt 249.0 lb

## 2023-11-05 DIAGNOSIS — E782 Mixed hyperlipidemia: Secondary | ICD-10-CM | POA: Diagnosis not present

## 2023-11-05 DIAGNOSIS — E66812 Obesity, class 2: Secondary | ICD-10-CM

## 2023-11-05 DIAGNOSIS — I10 Essential (primary) hypertension: Secondary | ICD-10-CM

## 2023-11-05 DIAGNOSIS — E1165 Type 2 diabetes mellitus with hyperglycemia: Secondary | ICD-10-CM

## 2023-11-05 DIAGNOSIS — Z794 Long term (current) use of insulin: Secondary | ICD-10-CM

## 2023-11-05 DIAGNOSIS — Z6837 Body mass index (BMI) 37.0-37.9, adult: Secondary | ICD-10-CM

## 2023-11-05 NOTE — Assessment & Plan Note (Signed)
 Current weight 249 pounds.  BMI 37.8.  He recognizes that his weight is negatively contributing to multiple aspects of his health and is actively working to lose weight.  He is currently prescribed Ozempic  as well.

## 2023-11-05 NOTE — Telephone Encounter (Signed)
 Talked with the patient and verified that he is to be injecting the 1 mg weekly of Ozempic . Patient is on his way to pick up the medication that was delivered today from [facility].

## 2023-11-05 NOTE — Patient Instructions (Signed)
It was a pleasure to see you today.  Thank you for giving [place] the opportunity to be involved in your care.  Below is a brief recap of your visit and next steps.  We will plan to see you again in 6 months  Summary No medication changes today Follow up in 6 months

## 2023-11-05 NOTE — Progress Notes (Signed)
 Established Patient Office Visit  Subjective   Patient ID: Gerald Evans, male    DOB: 06-24-1946  Age: 78 y.o. MRN: [id number]  Chief Complaint  Patient presents with   Care Management    Three month follow up   Gerald Evans returns to care Evans for routine follow-up.  He was last evaluated by me on 2/7.  His blood pressure was poorly controlled at that time and I recommended resuming chlorthalidone  25 mg daily.  Simvastatin  was also discontinued in favor of atorvastatin  40 mg daily due to uncontrolled hyperlipidemia.  Repeat labs were ordered and 39-month follow-up was arranged for reassessment.  In the interim, he was admitted to [facility] 3/5 - 3/7 in the setting of non-cardiac chest pain. Seen by his cardiologist for follow up 3/7. Gerald Evans and has no acute concerns to discuss.  Past Medical History:  Diagnosis Date   CHF (congestive heart failure) (HCC)    Colon polyps    Diabetes mellitus without complication (HCC)    Edema of both lower extremities    Fatty liver    Hypercholesteremia    Hypertension    Past Surgical History:  Procedure Laterality Date   COLONOSCOPY     COLONOSCOPY  04/21/2012   Procedure: COLONOSCOPY;  Surgeon: Ruby Corporal, MD;  Location: [facility];  Service: Endoscopy;  Laterality: N/A;  1200   COLONOSCOPY N/A 09/09/2017   Procedure: COLONOSCOPY;  Surgeon: Ruby Corporal, MD;  Location: [facility];  Service: Endoscopy;  Laterality: N/A;  1200-rescheduled to 3/14/ @12 :00pm per Lorelee Roger   HERNIA REPAIR     POLYPECTOMY  09/09/2017   Procedure: POLYPECTOMY;  Surgeon: Ruby Corporal, MD;  Location: [facility];  Service: Endoscopy;;  Transverse Colon (CB)   Social History   Tobacco Use   Smoking status: Former    Current packs/day: 0.50    Average packs/day: 0.5 packs/day for 20.0 years (10.0 ttl pk-yrs)    Types: Cigarettes   Smokeless tobacco: Never  Vaping Use   Vaping status: Never Used  Substance  Use Topics   Alcohol  use: Not Currently   Drug use: No   Family History  Problem Relation Age of Onset   Hypertension Mother    Hypertension Father    Alcoholism Father    Obesity Father    Diabetes Sister    Hypertension Sister    Healthy Daughter    Healthy Son    Colon cancer Neg Hx    Allergies  Allergen Reactions   Lisinopril Cough   Review of Systems  Constitutional:  Negative for chills and fever.  HENT:  Negative for sore throat.   Respiratory:  Negative for cough and shortness of breath.   Cardiovascular:  Negative for chest pain, palpitations and leg swelling.  Gastrointestinal:  Negative for abdominal pain, blood in stool, constipation, diarrhea, nausea and vomiting.  Genitourinary:  Negative for dysuria and hematuria.  Musculoskeletal:  Negative for myalgias.  Skin:  Negative for itching and rash.  Neurological:  Negative for dizziness and headaches.  Psychiatric/Behavioral:  Negative for depression and suicidal ideas.      Objective:     BP 127/66   Pulse 74   Ht 5\' 8"  (1.727 m)   Wt 249 lb (112.9 kg)   SpO2 92%   BMI 37.86 kg/m  BP Readings from Last 3 Encounters:  11/05/23 127/66  10/21/23 118/66  09/16/23 (!) 154/72   Physical Exam  Vitals reviewed.  Constitutional:      General: He is not in acute distress.    Appearance: Normal appearance. He is obese. He is not ill-appearing.  HENT:     Head: Normocephalic and atraumatic.     Right Ear: External ear normal.     Left Ear: External ear normal.     Nose: Nose normal. No congestion or rhinorrhea.     Mouth/Throat:     Mouth: Mucous membranes are moist.     Pharynx: Oropharynx is clear.  Eyes:     General: No scleral icterus.    Extraocular Movements: Extraocular movements intact.     Conjunctiva/sclera: Conjunctivae normal.     Pupils: Pupils are equal, round, and reactive to light.  Cardiovascular:     Rate and Rhythm: Normal rate and regular rhythm.     Pulses: Normal pulses.      Heart sounds: Normal heart sounds. No murmur heard. Pulmonary:     Effort: Pulmonary effort is normal.     Breath sounds: Normal breath sounds. No wheezing, rhonchi or rales.  Abdominal:     General: Abdomen is flat. Bowel sounds are normal. There is no distension.     Palpations: Abdomen is soft.     Tenderness: There is no abdominal tenderness.  Musculoskeletal:        General: No swelling or deformity. Normal range of motion.     Cervical back: Normal range of motion.  Skin:    General: Skin is warm and dry.     Capillary Refill: Capillary refill takes less than 2 seconds.  Neurological:     General: No focal deficit present.     Mental Status: He is alert and oriented to person, place, and time.     Motor: No weakness.  Psychiatric:        Mood and Affect: Mood normal.        Behavior: Behavior normal.        Thought Content: Thought content normal.   Last CBC Lab Results  Component Value Date   WBC 9.5 09/04/2023   HGB 14.0 09/04/2023   HCT 44.0 09/04/2023   MCV 73.0 (L) 09/04/2023   MCH 23.2 (L) 09/04/2023   RDW 17.0 (H) 09/04/2023   PLT 249 09/04/2023   Last metabolic panel Lab Results  Component Value Date   GLUCOSE 185 (H) 09/16/2023   NA 140 09/16/2023   K 3.5 09/16/2023   CL 101 09/16/2023   CO2 21 09/16/2023   BUN 27 09/16/2023   CREATININE 1.89 (H) 09/16/2023   EGFR 36 (L) 09/16/2023   CALCIUM  9.3 09/16/2023   PHOS 3.7 09/02/2023   PROT 8.1 09/04/2023   ALBUMIN 4.2 09/04/2023   LABGLOB 2.5 07/16/2023   AGRATIO 1.8 07/23/2022   BILITOT 0.8 09/04/2023   ALKPHOS 80 09/04/2023   AST 27 09/04/2023   ALT 24 09/04/2023   ANIONGAP 12 09/04/2023   Last lipids Lab Results  Component Value Date   CHOL 114 09/03/2023   HDL 38 (L) 09/03/2023   LDLCALC 55 09/03/2023   TRIG 103 09/03/2023   CHOLHDL 3.0 09/03/2023   Last hemoglobin A1c Lab Results  Component Value Date   HGBA1C 8.2 (A) 10/21/2023   Last thyroid functions Lab Results  Component  Value Date   TSH 2.080 07/16/2023   T3TOTAL 98 03/04/2021   Last vitamin D  Lab Results  Component Value Date   VD25OH 59.0 05/12/2023   Last vitamin B12 and Folate Lab  Results  Component Value Date   VITAMINB12 422 07/23/2022   FOLATE 16.9 05/25/2016     Assessment & Plan:   Problem List Items Addressed This Visit       Essential hypertension - Primary   Chlorthalidone  was resumed at his last appointment.  BP is adequately controlled on his currently prescribed antihypertensive regimen.  No medication changes are indicated Evans.      Uncontrolled type 2 diabetes mellitus with hyperglycemia, with long-term current use of insulin  (HCC)   Followed by endocrinology.  A1c 8.2 on labs from April, improved from 9.3 previously.  He remains on Ozempic .  Endocrinology follow-up is scheduled for August.      Mixed hyperlipidemia   Lipid panel updated in March.  Total cholesterol 114 and LDL 55.  Both values significantly improved with switching to atorvastatin .  No additional changes are indicated at this time.      Class 2 severe obesity with serious comorbidity and body mass index (BMI) of 37.0 to 37.9 in adult [facility])   Current weight 249 pounds.  BMI 37.8.  He recognizes that his weight is negatively contributing to multiple aspects of his health and is actively working to lose weight.  He is currently prescribed Ozempic  as well.      Return in about 6 months (around 05/07/2024).   Tobi Fortes, MD

## 2023-11-05 NOTE — Assessment & Plan Note (Signed)
 Lipid panel updated in March.  Total cholesterol 114 and LDL 55.  Both values significantly improved with switching to atorvastatin .  No additional changes are indicated at this time.

## 2023-11-05 NOTE — Assessment & Plan Note (Signed)
 Chlorthalidone  was resumed at his last appointment.  BP is adequately controlled on his currently prescribed antihypertensive regimen.  No medication changes are indicated today.

## 2023-11-05 NOTE — Telephone Encounter (Signed)
 Pt picked up pt assistance of ozempic  1 mg

## 2023-11-05 NOTE — Telephone Encounter (Signed)
 Patient was called to verify his Ozempic  dose. Patient picked  up assistance on 11/04/23, then today we received another shipment of Ozempic  1 mg. At the time of his office visit in April the patient 's dosage was increased to the 1 mg. At the time of this call , there was no answer and the patient's voicemail was full.  We will try to reach back out to the patient.

## 2023-11-05 NOTE — Assessment & Plan Note (Signed)
 Followed by endocrinology.  A1c 8.2 on labs from April, improved from 9.3 previously.  He remains on Ozempic .  Endocrinology follow-up is scheduled for August.

## 2023-11-08 ENCOUNTER — Other Ambulatory Visit: Payer: Self-pay

## 2023-11-08 MED ORDER — FAMOTIDINE 20 MG PO TABS: 20.0000 mg | ORAL_TABLET | Freq: Every day | ORAL | 0 refills | Status: AC

## 2023-11-12 ENCOUNTER — Other Ambulatory Visit: Payer: Medicare HMO

## 2023-11-14 ENCOUNTER — Other Ambulatory Visit: Payer: Self-pay | Admitting: Internal Medicine

## 2023-11-14 DIAGNOSIS — I10 Essential (primary) hypertension: Secondary | ICD-10-CM

## 2023-11-16 ENCOUNTER — Other Ambulatory Visit

## 2023-11-16 DIAGNOSIS — R972 Elevated prostate specific antigen [PSA]: Secondary | ICD-10-CM

## 2023-11-17 ENCOUNTER — Ambulatory Visit: Payer: Self-pay

## 2023-11-17 LAB — PSA, TOTAL AND FREE
PSA, Free Pct: 21.4 %
PSA, Free: 1.26 ng/mL
Prostate Specific Ag, Serum: 5.9 ng/mL — ABNORMAL HIGH (ref 0.0–4.0)

## 2023-11-18 ENCOUNTER — Ambulatory Visit: Payer: Medicare HMO | Admitting: Urology

## 2023-11-24 ENCOUNTER — Ambulatory Visit: Payer: Medicare HMO

## 2023-11-25 ENCOUNTER — Ambulatory Visit: Payer: Medicare HMO | Admitting: Urology

## 2023-11-25 ENCOUNTER — Telehealth: Payer: Self-pay

## 2023-11-25 ENCOUNTER — Encounter: Payer: Self-pay | Admitting: Urology

## 2023-11-25 VITALS — BP 129/69 | HR 83

## 2023-11-25 DIAGNOSIS — N401 Enlarged prostate with lower urinary tract symptoms: Secondary | ICD-10-CM

## 2023-11-25 DIAGNOSIS — R3915 Urgency of urination: Secondary | ICD-10-CM | POA: Diagnosis not present

## 2023-11-25 DIAGNOSIS — N138 Other obstructive and reflux uropathy: Secondary | ICD-10-CM

## 2023-11-25 DIAGNOSIS — R351 Nocturia: Secondary | ICD-10-CM

## 2023-11-25 DIAGNOSIS — R972 Elevated prostate specific antigen [PSA]: Secondary | ICD-10-CM

## 2023-11-25 DIAGNOSIS — N5201 Erectile dysfunction due to arterial insufficiency: Secondary | ICD-10-CM

## 2023-11-25 DIAGNOSIS — Z87440 Personal history of urinary (tract) infections: Secondary | ICD-10-CM

## 2023-11-25 LAB — URINALYSIS, ROUTINE W REFLEX MICROSCOPIC
Bilirubin, UA: NEGATIVE
Glucose, UA: NEGATIVE
Ketones, UA: NEGATIVE
Leukocytes,UA: NEGATIVE
Nitrite, UA: NEGATIVE
Protein,UA: NEGATIVE
RBC, UA: NEGATIVE
Specific Gravity, UA: 1.01 (ref 1.005–1.030)
Urobilinogen, Ur: 0.2 mg/dL (ref 0.2–1.0)
pH, UA: 6 (ref 5.0–7.5)

## 2023-11-25 MED ORDER — TADALAFIL 20 MG PO TABS: ORAL_TABLET | ORAL | 11 refills | Status: AC

## 2023-11-25 MED ORDER — SILDENAFIL CITRATE 100 MG PO TABS: ORAL_TABLET | ORAL | 11 refills | Status: AC

## 2023-11-25 NOTE — Progress Notes (Unsigned)
 Subjective: 1. BPH with urinary obstruction   2. Elevated PSA   3. Nocturia   4. Urgency of urination   5. Erectile dysfunction due to arterial insufficiency   6. Personal history of urinary infection       11/25/23: Gerald Evans returns today in f/u.  His PSA is 5.9 with a 21.4% f/t ratio.   His UA is clear and he is voiding well with an IPSS of 6 with nocturia x 1.  He has a clear urine.  He has some left flank pain for about 2 weeks but it is mild and intermittent and improving. It is worse with movement particularly in the morning.  He remains on the sildenafil  with success.   He had a CT AP in 3/25 and his prostate was about 52ml on that study.  He had no stones on that study.   05/20/23: Gerald Evans returns today in f/u.   His PSA is back down to 5.6 with an 18.4 % f/t ratio.  He is voiding well with an IPSS of 1 and nocturia x 1.  He has had no hematuria or dysuria.   He remains on sildenafil  and tadalafil  with success.   02/18/23: Gerald Evans returns today in f/u.  His PSA is up to 6.2 with a 17.3% f/t ration.  He remains on sildenafil  for the ED with success.  He continues to void well with nocturia x 1.  He has had no UTI symptoms. HIs UA is clear.  He had COVID a month ago.   08/20/22: Gerald Evans returns today in f/u.  His PSA is stable at 5 with an 18.8% f/t ratio.  He remains on sildenafil  for ED with some decline in function. He continues to void well with an IPSS of 2 and nocturia x 1.  He is off of the furosemide.  He has no symptoms to suggest a recurrent UTI.  His UA is clear.   01/15/22: Gerald Evans returns today in f/u for his elevated PSA of 5.  It is down to 4.9 prior to this visit.   His UA is clear.  His IPSS is 4 with nocturia x 2. He is taking an antiacid with the sildenafil  which has eliminated the heartburn.   He has been started on furosemide for ankle edema but isn't having too much frequency with that.  He has had prior UTI's but his UA is clear today.   10/23/21: Gerald Evans  returns today in f/u for his history of an elevated PSA associated with a UTI.   The PSA was back down to 2.6 after treatment but is back up to 5.0 with an 18.4% f/t ratio prior to this visit.  He has had no recurrent UTI's.  His IPSS today is 5 with nocturia x 2.  He has ED and continues to use sildenafil  but he gets heartburn with it.  His UA is unremarkable.  He has an issue with the 3rd finger on the right hand to straightening completely.    10/24/20: Gerald Evans returns today with a repeat PSA which is down to 2.6 with a 26% f/t ratio after completing his antibiotics.   His UA is clear.     GU Hx: Gerald Evans is a 78 yo male who is sent by Dr. Eldon Greenland for the evaluation of an elevated PSA of 36 on 08/23/20.  He thinks his PSA was about 3.1 in 2021.  He had been seen in the ER on 2/23 and was  found to have a e. Coli UTI.   He was treated with a 7 day course of keflex .  A CT showed changes consistent with cystitis.  He had previously seen Dr. Joie Narrow for ED. His UA is clear today.  He has a little residual initial dysuria.  He has moderate LUTS with urgency and occasional UUI. He doesn't always feel empty.  He has a good stream.  He has nocturia x 2.   He has had no prior UTI's or GU surgery.   He is a diabetic and has HTN.  ROS:  Review of Systems  Cardiovascular:  Positive for leg swelling.  All other systems reviewed and are negative.   Allergies  Allergen Reactions   Lisinopril Cough    Past Medical History:  Diagnosis Date   CHF (congestive heart failure) (HCC)    Colon polyps    Diabetes mellitus without complication (HCC)    Edema of both lower extremities    Fatty liver    Hypercholesteremia    Hypertension     Past Surgical History:  Procedure Laterality Date   COLONOSCOPY     COLONOSCOPY  04/21/2012   Procedure: COLONOSCOPY;  Surgeon: Ruby Corporal, MD;  [facility];  Service: Endoscopy;  Laterality: N/A;  1200   COLONOSCOPY N/A 09/09/2017   Procedure:  COLONOSCOPY;  Surgeon: Ruby Corporal, MD;  [facility];  Service: Endoscopy;  Laterality: N/A;  1200-rescheduled to 3/14/ @12 :00pm per Ann   HERNIA REPAIR     POLYPECTOMY  09/09/2017   Procedure: POLYPECTOMY;  Surgeon: Ruby Corporal, MD;  [facility];  Service: Endoscopy;;  Transverse Colon (CB)    Social History   Socioeconomic History   Marital status: Married    Spouse name: Not on file   Number of children: 2   Years of education: Not on file   Highest education level: Not on file  Occupational History   Occupation: retired  Tobacco Use   Smoking status: Former    Current packs/day: 0.50    Average packs/day: 0.5 packs/day for 20.0 years (10.0 ttl pk-yrs)    Types: Cigarettes   Smokeless tobacco: Never  Vaping Use   Vaping status: Never Used  Substance and Sexual Activity   Alcohol  use: Not Currently   Drug use: No   Sexual activity: Not on file  Other Topics Concern   Not on file  Social History Narrative   Not on file   Social Drivers of Health   Financial Resource Strain: Low Risk  (11/18/2022)   Overall Financial Resource Strain (CARDIA)    Difficulty of Paying Living Expenses: Not hard at all  Food Insecurity: No Food Insecurity (09/06/2023)   Hunger Vital Sign    Worried About Running Out of Food in the Last Year: Never true    Ran Out of Food in the Last Year: Never true  Transportation Needs: No Transportation Needs (09/06/2023)   PRAPARE - Administrator, Civil Service (Medical): No    Lack of Transportation (Non-Medical): No  Physical Activity: Sufficiently Active (11/18/2022)   Exercise Vital Sign    Days of Exercise per Week: 7 days    Minutes of Exercise per Session: 30 min  Stress: No Stress Concern Present (11/18/2022)   [employer] of Occupational Health - Occupational Stress Questionnaire    Feeling of Stress : Not at all  Social Connections: Socially Integrated (09/02/2023)   Social Connection and  Isolation  Panel [NHANES]    Frequency of Communication with Friends and Family: More than three times a week    Frequency of Social Gatherings with Friends and Family: More than three times a week    Attends Religious Services: More than 4 times per year    Active Member of [employer] or Organizations: Yes    Attends Engineer, structural: More than 4 times per year    Marital Status: Married  Catering manager Violence: Not At Risk (09/06/2023)   Humiliation, Afraid, Rape, and Kick questionnaire    Fear of Current or Ex-Partner: No    Emotionally Abused: No    Physically Abused: No    Sexually Abused: No    Family History  Problem Relation Age of Onset   Hypertension Mother    Hypertension Father    Alcoholism Father    Obesity Father    Diabetes Sister    Hypertension Sister    Healthy Daughter    Healthy Son    Colon cancer Neg Hx     Anti-infectives: Anti-infectives (From admission, onward)    None       Current Outpatient Medications  Medication Sig Dispense Refill   atorvastatin  (LIPITOR) 40 MG tablet Take 1 tablet (40 mg total) by mouth daily. 90 tablet 3   blood glucose meter kit and supplies Dispense based on patient and insurance preference. Use up to four times daily as directed. (FOR ICD-10 E11.65) 1 each 0   chlorthalidone  (HYGROTON ) 25 MG tablet Take 1 tablet (25 mg total) by mouth daily. 90 tablet 3   famotidine  (PEPCID ) 20 MG tablet Take 1 tablet (20 mg total) by mouth daily. 30 tablet 0   glucose blood (ONETOUCH ULTRA) test strip USE 1 STRIP TO CHECK GLUCOSE THREE TIMES DAILY AS DIRECTED E11.65 300 each 1   hydrALAZINE  (APRESOLINE ) 50 MG tablet TAKE 1 TABLET BY MOUTH THREE TIMES DAILY 90 tablet 0   insulin  aspart protamine - aspart (NOVOLOG  70/30 FLEXPEN) (70-30) 100 UNIT/ML FlexPen Inject 40 Units into the skin 2 (two) times daily with a meal. 75 mL 3   Insulin  Pen Needle (BD PEN NEEDLE NANO 2ND GEN) 32G X 4 MM MISC 1 Package by Does not apply route 2  (two) times daily. 100 each 0   levothyroxine  (SYNTHROID ) 75 MCG tablet Take 1 tablet (75 mcg total) by mouth daily before breakfast. 90 tablet 1   losartan  (COZAAR ) 100 MG tablet Take 1 tablet (100 mg total) by mouth daily. 90 tablet 0   metoprolol  tartrate (LOPRESSOR ) 100 MG tablet Take 1 tablet (100 mg total) by mouth 2 (two) times daily. 180 tablet 0   OneTouch Delica Lancets 30G MISC 1 each by Does not apply route 3 (three) times daily. Use to check blood glucose three times daily 100 each 3   Vitamin D , Ergocalciferol , (DRISDOL ) 1.25 MG (50000 UNIT) CAPS capsule Take 1 capsule (50,000 Units total) by mouth every 7 (seven) days. 12 capsule 0   sildenafil  (VIAGRA ) 100 MG tablet Take 1/2 tab in combination with 10mg  of tadalafil . 30 tablet 11   tadalafil  (CIALIS ) 20 MG tablet Use either 1 tab po daily prn or 1/2 tab po with 1/2 tab sildenafil  100mg  po daily prn 6 tablet 11   No current facility-administered medications for this visit.     Objective: Vital signs in last 24 hours: BP 129/69   Pulse 83   Intake/Output from previous day: No intake/output data recorded. Intake/Output this shift: @IOTHISSHIFT @  Physical Exam Vitals reviewed.  Constitutional:      Appearance: Normal appearance.  Genitourinary:    Comments: AP NST without mass. Prostate is 2+ benign. SV non-palpable.  Neurological:     Mental Status: He is alert.     Lab Results:  Recent Results (from the past 2160 hours)  APTT     Status: None   Collection Time: 09/01/23  5:37 PM  Result Value Ref Range   aPTT 30 24 - 36 seconds    Comment: Performed at [facility], [address] [place], [place] [address]  Protime-INR     Status: None   Collection Time: 09/01/23  5:37 PM  Result Value Ref Range   Prothrombin Time 12.8 11.4 - 15.2 seconds   INR 0.9 0.8 - 1.2    Comment: (NOTE) INR goal varies based on device and disease states. Performed at [facility], [address] [place], [place] [address]    Basic metabolic panel     Status: None   Collection Time: 09/01/23  5:39 PM  Result Value Ref Range   Sodium 137 135 - 145 mmol/L   Potassium 3.7 3.5 - 5.1 mmol/L   Chloride 99 98 - 111 mmol/L   CO2 29 22 - 32 mmol/L   Glucose, Bld 92 70 - 99 mg/dL    Comment: Glucose reference range applies only to samples taken after fasting for at least 8 hours.   BUN 18 8 - 23 mg/dL   Creatinine, Ser 8.65 0.61 - 1.24 mg/dL   Calcium  9.4 8.9 - 10.3 mg/dL   GFR, Estimated >78 >46 mL/min    Comment: (NOTE) Calculated using the CKD-EPI Creatinine Equation (2021)    Anion gap 9 5 - 15    Comment: Performed at [facility], [address] [place], [place] [address]  Troponin I (High Sensitivity)     Status: Abnormal   Collection Time: 09/01/23  5:39 PM  Result Value Ref Range   Troponin I (High Sensitivity) 104 (HH) <18 ng/L    Comment: CRITICAL RESULT CALLED TO, READ BACK BY AND VERIFIED WITH D FOWLER RN [phone] K FORSYTH (NOTE) Elevated high sensitivity troponin I (hsTnI) values and significant  changes across serial measurements may suggest ACS but many other  chronic and acute conditions are known to elevate hsTnI results.  Refer to the "Links" section for chest pain algorithms and additional  guidance. Performed at [facility], [address], [place], [place] [address]   CBC with Differential     Status: Abnormal   Collection Time: 09/01/23  5:39 PM  Result Value Ref Range   WBC 7.6 4.0 - 10.5 K/uL   RBC 5.67 4.22 - 5.81 MIL/uL   Hemoglobin 12.9 (L) 13.0 - 17.0 g/dL   HCT 36.6 44.0 - 34.7 %   MCV 73.7 (L) 80.0 - 100.0 fL   MCH 22.8 (L) 26.0 - 34.0 pg   MCHC 30.9 30.0 - 36.0 g/dL   RDW 42.5 (H) 95.6 - 38.7 %   Platelets 221 150 - 400 K/uL   nRBC 0.0 0.0 - 0.2 %   Neutrophils Relative % 49 %   Neutro Abs 3.7 1.7 - 7.7 K/uL   Lymphocytes Relative 40 %   Lymphs Abs 3.1 0.7 - 4.0 K/uL   Monocytes Relative 8 %   Monocytes Absolute 0.6 0.1 - 1.0 K/uL   Eosinophils Relative 2 %    Eosinophils Absolute 0.2 0.0 - 0.5 K/uL   Basophils Relative  1 %   Basophils Absolute 0.1 0.0 - 0.1 K/uL   Immature Granulocytes 0 %   Abs Immature Granulocytes 0.03 0.00 - 0.07 K/uL    Comment: Performed at [facility], [address], [place], [place] [address]  Troponin I (High Sensitivity)     Status: Abnormal   Collection Time: 09/01/23  8:07 PM  Result Value Ref Range   Troponin I (High Sensitivity) 94 (H) <18 ng/L    Comment: (NOTE) Elevated high sensitivity troponin I (hsTnI) values and significant  changes across serial measurements may suggest ACS but many other  chronic and acute conditions are known to elevate hsTnI results.  Refer to the "Links" section for chest pain algorithms and additional  guidance. Performed at [facility], [address] [place], [place] [address]   CBG monitoring, ED     Status: Abnormal   Collection Time: 09/02/23  5:19 AM  Result Value Ref Range   Glucose-Capillary 172 (H) 70 - 99 mg/dL    Comment: Glucose reference range applies only to samples taken after fasting for at least 8 hours.  Heparin  level (unfractionated)     Status: None   Collection Time: 09/02/23  5:31 AM  Result Value Ref Range   Heparin  Unfractionated 0.36 0.30 - 0.70 IU/mL    Comment: (NOTE) The clinical reportable range upper limit is being lowered to >1.10 to align with the FDA approved guidance for the current laboratory assay.  If heparin  results are below expected values, and patient dosage has  been confirmed, suggest follow up testing of antithrombin III levels. Performed at [facility], [address], [place], [place] [address]   CBC     Status: Abnormal   Collection Time: 09/02/23  5:43 AM  Result Value Ref Range   WBC 7.7 4.0 - 10.5 K/uL   RBC 5.00 4.22 - 5.81 MIL/uL   Hemoglobin 11.6 (L) 13.0 - 17.0 g/dL   HCT 21.3 (L) 08.6 - 57.8 %   MCV 72.8 (L) 80.0 - 100.0 fL   MCH 23.2 (L) 26.0 - 34.0 pg   MCHC 31.9 30.0 - 36.0 g/dL   RDW 46.9 (H) 62.9 - 52.8 %    Platelets 177 150 - 400 K/uL   nRBC 0.0 0.0 - 0.2 %    Comment: Performed at [facility], [address] [place], [place] [address]  Ferritin     Status: None   Collection Time: 09/02/23  5:43 AM  Result Value Ref Range   Ferritin 100 24 - 336 ng/mL    Comment: Performed at [facility], [address], [place], [place] [address]  Iron and TIBC     Status: None   Collection Time: 09/02/23  5:43 AM  Result Value Ref Range   Iron 92 45 - 182 ug/dL   TIBC 725 366 - 440 ug/dL   Saturation Ratios 34 17.9 - 39.5 %   UIBC 183 ug/dL    Comment: Performed at [facility], [address], [place], [place] [address]  Comprehensive metabolic panel     Status: Abnormal   Collection Time: 09/02/23  5:43 AM  Result Value Ref Range   Sodium 137 135 - 145 mmol/L   Potassium 2.7 (LL) 3.5 - 5.1 mmol/L    Comment: CRITICAL RESULT CALLED TO, READ BACK BY AND VERIFIED WITH KENNON,J AT 6:45AM ON 09/02/23 BY FESTERMAN,C DELTA CHECK NOTED    Chloride 102 98 - 111 mmol/L   CO2 24 22 - 32 mmol/L  Glucose, Bld 168 (H) 70 - 99 mg/dL    Comment: Glucose reference range applies only to samples taken after fasting for at least 8 hours.   BUN 16 8 - 23 mg/dL   Creatinine, Ser 1.61 0.61 - 1.24 mg/dL   Calcium  8.6 (L) 8.9 - 10.3 mg/dL   Total Protein 6.3 (L) 6.5 - 8.1 g/dL   Albumin 3.3 (L) 3.5 - 5.0 g/dL   AST 16 15 - 41 U/L   ALT 19 0 - 44 U/L   Alkaline Phosphatase 67 38 - 126 U/L   Total Bilirubin 0.7 0.0 - 1.2 mg/dL   GFR, Estimated >09 >60 mL/min    Comment: (NOTE) Calculated using the CKD-EPI Creatinine Equation (2021)    Anion gap 11 5 - 15    Comment: Performed at [facility], [address] [place], [place] [address]  Magnesium      Status: Abnormal   Collection Time: 09/02/23  5:43 AM  Result Value Ref Range   Magnesium  1.5 (L) 1.7 - 2.4 mg/dL    Comment: Performed at [facility], [address], [place], [place] [address]  Phosphorus     Status: None   Collection Time: 09/02/23  5:43 AM   Result Value Ref Range   Phosphorus 3.7 2.5 - 4.6 mg/dL    Comment: Performed at [facility], [address], [place], [place] [address]  Troponin I (High Sensitivity)     Status: Abnormal   Collection Time: 09/02/23  5:43 AM  Result Value Ref Range   Troponin I (High Sensitivity) 80 (H) <18 ng/L    Comment: (NOTE) Elevated high sensitivity troponin I (hsTnI) values and significant  changes across serial measurements may suggest ACS but many other  chronic and acute conditions are known to elevate hsTnI results.  Refer to the "Links" section for chest pain algorithms and additional  guidance. Performed at [facility], [address] [place], [place] [address]   CBG monitoring, ED     Status: Abnormal   Collection Time: 09/02/23  8:08 AM  Result Value Ref Range   Glucose-Capillary 144 (H) 70 - 99 mg/dL    Comment: Glucose reference range applies only to samples taken after fasting for at least 8 hours.  D-dimer, quantitative     Status: None   Collection Time: 09/02/23 10:18 AM  Result Value Ref Range   D-Dimer, Quant 0.43 0.00 - 0.50 ug/mL-FEU    Comment: (NOTE) At the manufacturer cut-off value of 0.5 g/mL FEU, this assay has a negative predictive value of 95-100%.This assay is intended for use in conjunction with a clinical pretest probability (PTP) assessment model to exclude pulmonary embolism (PE) and deep venous thrombosis (DVT) in outpatients suspected of PE or DVT. Results should be correlated with clinical presentation. Performed at [facility], [address], [place], [place] [address]   Basic metabolic panel     Status: Abnormal   Collection Time: 09/02/23  2:28 PM  Result Value Ref Range   Sodium 132 (L) 135 - 145 mmol/L   Potassium 3.9 3.5 - 5.1 mmol/L   Chloride 99 98 - 111 mmol/L   CO2 24 22 - 32 mmol/L   Glucose, Bld 239 (H) 70 - 99 mg/dL    Comment: Glucose reference range applies only to samples taken after fasting for at least 8 hours.   BUN 16 8 - 23  mg/dL   Creatinine, Ser 8.46 0.61 - 1.24 mg/dL   Calcium  8.9 8.9 - 10.3  mg/dL   GFR, Estimated >47 >82 mL/min    Comment: (NOTE) Calculated using the CKD-EPI Creatinine Equation (2021)    Anion gap 9 5 - 15    Comment: Performed at [facility], [address], [place], [place] [address]  Glucose, capillary     Status: Abnormal   Collection Time: 09/02/23  4:37 PM  Result Value Ref Range   Glucose-Capillary 160 (H) 70 - 99 mg/dL    Comment: Glucose reference range applies only to samples taken after fasting for at least 8 hours.  ECHOCARDIOGRAM COMPLETE     Status: None   Collection Time: 09/02/23  4:50 PM  Result Value Ref Range   Weight 3,940.8 oz   Height 68 in   BP 134/60 mmHg   Single Plane A4C EF 65.1 %   AR max vel 2.25 cm2   AV Area VTI 2.29 cm2   AV Mean grad 4.0 mmHg   AV Peak grad 6.9 mmHg   Ao pk vel 1.31 m/s   AV Area mean vel 2.07 cm2   MV VTI 2.09 cm2   Area-P 1/2 3.37 cm2   S' Lateral 3.50 cm   Est EF 55 - 60%   Glucose, capillary     Status: Abnormal   Collection Time: 09/02/23  9:16 PM  Result Value Ref Range   Glucose-Capillary 211 (H) 70 - 99 mg/dL    Comment: Glucose reference range applies only to samples taken after fasting for at least 8 hours.   Comment 1 Notify RN    Comment 2 Document in Chart   Lipid panel     Status: Abnormal   Collection Time: 09/03/23  3:11 AM  Result Value Ref Range   Cholesterol 114 0 - 200 mg/dL   Triglycerides 308 <657 mg/dL   HDL 38 (L) >84 mg/dL   Total CHOL/HDL Ratio 3.0 RATIO   VLDL 21 0 - 40 mg/dL   LDL Cholesterol 55 0 - 99 mg/dL    Comment:        Total Cholesterol/HDL:CHD Risk Coronary Heart Disease Risk Table                     Men   Women  1/2 Average Risk   3.4   3.3  Average Risk       5.0   4.4  2 X Average Risk   9.6   7.1  3 X Average Risk  23.4   11.0        Use the calculated Patient Ratio above and the CHD Risk Table to determine the patient's CHD Risk.        ATP III CLASSIFICATION  (LDL):  <100     mg/dL   Optimal  696-295  mg/dL   Near or Above                    Optimal  130-159  mg/dL   Borderline  284-132  mg/dL   High  >440     mg/dL   Very High Performed at [facility], [address] [place], [place] [address]   CBC     Status: Abnormal   Collection Time: 09/03/23  3:11 AM  Result Value Ref Range   WBC 6.5 4.0 - 10.5 K/uL   RBC 5.20 4.22 - 5.81 MIL/uL   Hemoglobin 12.0 (L) 13.0 - 17.0 g/dL   HCT 53.6 (L) 64.4 - 03.4 %   MCV 73.5 (L) 80.0 -  100.0 fL   MCH 23.1 (L) 26.0 - 34.0 pg   MCHC 31.4 30.0 - 36.0 g/dL   RDW 16.1 (H) 09.6 - 04.5 %   Platelets 192 150 - 400 K/uL   nRBC 0.0 0.0 - 0.2 %    Comment: Performed at [facility] [place] [facility], [address] [place], [place] [address]  Basic metabolic panel     Status: Abnormal   Collection Time: 09/03/23  3:11 AM  Result Value Ref Range   Sodium 135 135 - 145 mmol/L   Potassium 3.9 3.5 - 5.1 mmol/L   Chloride 102 98 - 111 mmol/L   CO2 25 22 - 32 mmol/L   Glucose, Bld 185 (H) 70 - 99 mg/dL    Comment: Glucose reference range applies only to samples taken after fasting for at least 8 hours.   BUN 20 8 - 23 mg/dL   Creatinine, Ser 1.91 (H) 0.61 - 1.24 mg/dL   Calcium  8.7 (L) 8.9 - 10.3 mg/dL   GFR, Estimated 53 (L) >60 mL/min    Comment: (NOTE) Calculated using the CKD-EPI Creatinine Equation (2021)    Anion gap 8 5 - 15    Comment: Performed at [facility], [address] [place], [place] [address]  Magnesium      Status: None   Collection Time: 09/03/23  3:11 AM  Result Value Ref Range   Magnesium  2.0 1.7 - 2.4 mg/dL    Comment: Performed at [facility], [address] [place], [place] [address]  Glucose, capillary     Status: Abnormal   Collection Time: 09/03/23  7:47 AM  Result Value Ref Range   Glucose-Capillary 163 (H) 70 - 99 mg/dL    Comment: Glucose reference range applies only to samples taken after fasting for at least 8 hours.   Comment 1 Notify RN    Comment 2 Document in Chart   Resp  panel by RT-PCR (RSV, Flu A&B, Covid) Anterior Nasal Swab     Status: None   Collection Time: 09/04/23  2:07 PM   Specimen: Anterior Nasal Swab  Result Value Ref Range   SARS Coronavirus 2 by RT PCR NEGATIVE NEGATIVE   Influenza A by PCR NEGATIVE NEGATIVE   Influenza B by PCR NEGATIVE NEGATIVE    Comment: (NOTE) The Xpert Xpress SARS-CoV-2/FLU/RSV plus assay is intended as an aid in the diagnosis of influenza from Nasopharyngeal swab specimens and should not be used as a sole basis for treatment. Nasal washings and aspirates are unacceptable for Xpert Xpress SARS-CoV-2/FLU/RSV testing.  Fact Sheet for Patients: BloggerCourse.com  Fact Sheet for Healthcare Providers: SeriousBroker.it  This test is not yet approved or cleared by the [place]  FDA and has been authorized for detection and/or diagnosis of SARS-CoV-2 by FDA under an Emergency Use Authorization (EUA). This EUA will remain in effect (meaning this test can be used) for the duration of the COVID-19 declaration under Section 564(b)(1) of the Act, 21 U.S.C. section 360bbb-3(b)(1), unless the authorization is terminated or revoked.     Resp Syncytial Virus by PCR NEGATIVE NEGATIVE    Comment: (NOTE) Fact Sheet for Patients: BloggerCourse.com  Fact Sheet for Healthcare Providers: SeriousBroker.it  This test is not yet approved or cleared by the [place]  FDA and has been authorized for detection and/or diagnosis of SARS-CoV-2 by FDA under an Emergency Use Authorization (EUA). This EUA will remain in effect (meaning this test can be used) for the duration of the COVID-19 declaration under Section 564(b)(1) of the Act,  21 U.S.C. section 360bbb-3(b)(1), unless the authorization is terminated or revoked.  Performed at [facility], [address], [place], [place] [address]   Urinalysis, Routine w reflex  microscopic -Urine, Clean Catch     Status: Abnormal   Collection Time: 09/04/23  2:08 PM  Result Value Ref Range   Color, Urine YELLOW YELLOW   APPearance HAZY (A) CLEAR   Specific Gravity, Urine 1.023 1.005 - 1.030   pH 5.0 5.0 - 8.0   Glucose, UA NEGATIVE NEGATIVE mg/dL   Hgb urine dipstick NEGATIVE NEGATIVE   Bilirubin Urine NEGATIVE NEGATIVE   Ketones, ur NEGATIVE NEGATIVE mg/dL   Protein, ur 30 (A) NEGATIVE mg/dL   Nitrite NEGATIVE NEGATIVE   Leukocytes,Ua NEGATIVE NEGATIVE   RBC / HPF 0-5 0 - 5 RBC/hpf   WBC, UA 0-5 0 - 5 WBC/hpf   Bacteria, UA RARE (A) NONE SEEN   Squamous Epithelial / HPF 0-5 0 - 5 /HPF   Mucus PRESENT     Comment: Performed at [facility] [facility], [address], [place], [place] [address]  Lipase, blood     Status: None   Collection Time: 09/04/23  2:13 PM  Result Value Ref Range   Lipase 42 11 - 51 U/L    Comment: Performed at [facility], [address] [place], [place] [address]  Comprehensive metabolic panel     Status: Abnormal   Collection Time: 09/04/23  2:13 PM  Result Value Ref Range   Sodium 138 135 - 145 mmol/L   Potassium 4.1 3.5 - 5.1 mmol/L   Chloride 101 98 - 111 mmol/L   CO2 25 22 - 32 mmol/L   Glucose, Bld 154 (H) 70 - 99 mg/dL    Comment: Glucose reference range applies only to samples taken after fasting for at least 8 hours.   BUN 18 8 - 23 mg/dL   Creatinine, Ser 2.13 (H) 0.61 - 1.24 mg/dL   Calcium  10.3 8.9 - 10.3 mg/dL   Total Protein 8.1 6.5 - 8.1 g/dL   Albumin 4.2 3.5 - 5.0 g/dL   AST 27 15 - 41 U/L   ALT 24 0 - 44 U/L   Alkaline Phosphatase 80 38 - 126 U/L   Total Bilirubin 0.8 0.0 - 1.2 mg/dL   GFR, Estimated 57 (L) >60 mL/min    Comment: (NOTE) Calculated using the CKD-EPI Creatinine Equation (2021)    Anion gap 12 5 - 15    Comment: Performed at [facility], [address], [place], [place] [address]  CBC with Differential     Status: Abnormal   Collection Time: 09/04/23  2:13 PM  Result Value  Ref Range   WBC 9.5 4.0 - 10.5 K/uL   RBC 6.03 (H) 4.22 - 5.81 MIL/uL   Hemoglobin 14.0 13.0 - 17.0 g/dL   HCT 84.6 96.2 - 95.2 %   MCV 73.0 (L) 80.0 - 100.0 fL   MCH 23.2 (L) 26.0 - 34.0 pg   MCHC 31.8 30.0 - 36.0 g/dL   RDW 84.1 (H) 32.4 - 40.1 %   Platelets 249 150 - 400 K/uL   nRBC 0.0 0.0 - 0.2 %   Neutrophils Relative % 63 %   Neutro Abs 6.0 1.7 - 7.7 K/uL   Lymphocytes Relative 27 %   Lymphs Abs 2.6 0.7 - 4.0 K/uL   Monocytes Relative 9 %   Monocytes Absolute 0.8 0.1 - 1.0 K/uL   Eosinophils Relative  1 %   Eosinophils Absolute 0.1 0.0 - 0.5 K/uL   Basophils Relative 0 %   Basophils Absolute 0.0 0.0 - 0.1 K/uL   Immature Granulocytes 0 %   Abs Immature Granulocytes 0.04 0.00 - 0.07 K/uL    Comment: Performed at [facility], [address], [place], [place] [address]  Ammonia     Status: None   Collection Time: 09/04/23  4:50 PM  Result Value Ref Range   Ammonia 24 9 - 35 umol/L    Comment: Performed at [facility], [address], [place], [place] [address]  BMP8+EGFR     Status: Abnormal   Collection Time: 09/16/23  1:34 PM  Result Value Ref Range   Glucose 185 (H) 70 - 99 mg/dL   BUN 27 8 - 27 mg/dL   Creatinine, Ser 0.98 (H) 0.76 - 1.27 mg/dL   eGFR 36 (L) >11 BJ/YNW/2.95   BUN/Creatinine Ratio 14 10 - 24   Sodium 140 134 - 144 mmol/L   Potassium 3.5 3.5 - 5.2 mmol/L   Chloride 101 96 - 106 mmol/L   CO2 21 20 - 29 mmol/L   Calcium  9.3 8.6 - 10.2 mg/dL  HgB A2Z     Status: Abnormal   Collection Time: 10/21/23 11:19 AM  Result Value Ref Range   Hemoglobin A1C 8.2 (A) 4.0 - 5.6 %   HbA1c POC (<> result, manual entry)     HbA1c, POC (prediabetic range)     HbA1c, POC (controlled diabetic range)    PSA, total and free     Status: Abnormal   Collection Time: 11/16/23  1:47 PM  Result Value Ref Range   Prostate Specific Ag, Serum 5.9 (H) 0.0 - 4.0 ng/mL    Comment: Roche ECLIA methodology. According to the American Urological Association, Serum  PSA should decrease and remain at undetectable levels after radical prostatectomy. The AUA defines biochemical recurrence as an initial PSA value 0.2 ng/mL or greater followed by a subsequent confirmatory PSA value 0.2 ng/mL or greater. Values obtained with different assay methods or kits cannot be used interchangeably. Results cannot be interpreted as absolute evidence of the presence or absence of malignant disease.    PSA, Free 1.26 N/A ng/mL    Comment: Roche ECLIA methodology.   PSA, Free Pct 21.4 %    Comment: The table below lists the probability of prostate cancer for men with non-suspicious DRE results and total PSA between 4 and 10 ng/mL, by patient age Gerald Evans, JAMA 1998, [id number]).                   % Free PSA       50-64 yr        65-75 yr                   0.00-10.00%        56%             55%                  10.01-15.00%        24%             35%                  15.01-20.00%        17%             23%  20.01-25.00%        10%             20%                       >25.00%         5%              9% Please note:  Catalona et al did not make specific               recommendations regarding the use of               percent free PSA for any other population               of men.   Urinalysis, Routine w reflex microscopic     Status: None   Collection Time: 11/25/23 11:31 AM  Result Value Ref Range   Specific Gravity, UA 1.010 1.005 - 1.030   pH, UA 6.0 5.0 - 7.5   Color, UA Yellow Yellow   Appearance Ur Clear Clear   Leukocytes,UA Negative Negative   Protein,UA Negative Negative/Trace   Glucose, UA Negative Negative   Ketones, UA Negative Negative   RBC, UA Negative Negative   Bilirubin, UA Negative Negative   Urobilinogen, Ur 0.2 0.2 - 1.0 mg/dL   Nitrite, UA Negative Negative   Microscopic Examination Comment     Comment: Microscopic not indicated and not performed.   UA is clear.  Studies/Results: No results  found.   Assessment/Plan: Elevated PSA.  His PSA is back down some so I think I will just recheck it in 6 months.  BPH with BOO.  He only has mild LUTS.   History of UTI/Prostatitis.  He will get a UA today.   ED.  He is responding tp the combination of tadalafil  20mg   1/2 and sildenafil  100mg  1/2 as needed.     Meds ordered this encounter  Medications   tadalafil  (CIALIS ) 20 MG tablet    Sig: Use either 1 tab po daily prn or 1/2 tab po with 1/2 tab sildenafil  100mg  po daily prn    Dispense:  6 tablet    Refill:  11   sildenafil  (VIAGRA ) 100 MG tablet    Sig: Take 1/2 tab in combination with 10mg  of tadalafil .    Dispense:  30 tablet    Refill:  11     Orders Placed This Encounter  Procedures   Urinalysis, Routine w reflex microscopic   PSA, total and free    Standing Status:   Future    Expected Date:   05/27/2024    Expiration Date:   11/24/2024   Ambulatory referral to Urology    Referral Priority:   Routine    Referral Type:   Consultation    Referral Reason:   Specialty Services Required    Referred to Provider:   Homero Luster, MD    Requested Specialty:   Urology    Number of Visits Requested:   1     Return in about 6 months (around 05/27/2024) for in [place].    CC: Dr. Tesfaye Fanta.      Talyn Eddie 11/26/[phone] ID: Gerald Evans, male   DOB: 1946/05/11, 78 y.o.   MRN: [id number]

## 2023-11-25 NOTE — Telephone Encounter (Signed)
 Called pt to make him aware to do a urine drop off per MD for 24 hour urine test pt voiced understanding

## 2023-11-29 ENCOUNTER — Encounter (INDEPENDENT_AMBULATORY_CARE_PROVIDER_SITE_OTHER): Payer: Self-pay | Admitting: Gastroenterology

## 2023-11-29 ENCOUNTER — Ambulatory Visit (INDEPENDENT_AMBULATORY_CARE_PROVIDER_SITE_OTHER): Admitting: Gastroenterology

## 2023-11-29 VITALS — BP 131/74 | HR 68 | Temp 97.3°F | Ht 68.0 in | Wt 252.3 lb

## 2023-11-29 DIAGNOSIS — K7581 Nonalcoholic steatohepatitis (NASH): Secondary | ICD-10-CM | POA: Diagnosis not present

## 2023-11-29 DIAGNOSIS — R1010 Upper abdominal pain, unspecified: Secondary | ICD-10-CM | POA: Diagnosis not present

## 2023-11-29 DIAGNOSIS — K7402 Hepatic fibrosis, advanced fibrosis: Secondary | ICD-10-CM

## 2023-11-29 DIAGNOSIS — K7400 Hepatic fibrosis, unspecified: Secondary | ICD-10-CM

## 2023-11-29 NOTE — Patient Instructions (Addendum)
 Perform blood workup Explained to the patient the etiology and consequences of his current liver disease. Patient was counseled about the benefit of implementing a Mediterranean diet and exercise plan to decrease at least 5% of weight. The patient understood about the importance of lifestyle changes to potentially reverse his liver involvement. If the blood Suggest ongoing advanced fibrosis, we will need to start on Rezdiffra Continue Pepcid  as needed for upper abdominal discomfort

## 2023-11-29 NOTE — Progress Notes (Signed)
 Samantha Cress, M.D. Gastroenterology & Hepatology [facility] [address] [place], [place] [address]  Primary Care Physician: Tobi Fortes, MD [address] [place] [address]  I will communicate my assessment and recommendations to the referring MD via EMR.  Problems: NASH with F3/F4 fibrosis IBS   History of Present Illness: Gerald Evans is a 78 y.o. male with past medical history NASH with F3/F4 fibrosis, diabetes, IBS, hyperlipidemia, hypertension, who presents for follow up of NASH fibrosis.  The patient was last seen on 05/11/2022. At that time, the patient was ordered to have elastography and had Delight Felts checked.  He was also referred to pulmonology and was advised to follow-up with cardiologist regarding shortness of breath.  Blood workup on 05/11/2022 showed F2 fibrosis without steatosis.  Repeat elastography was performed on 05/28/2022 that showed median K PA of 3.9 but IQR was elevated at 0.46.  Repeat elastography on 12/04/2022 showed a K PA of 2.7 with elevated IQR of 0.33.  Patient reports that he has had chronic issues with pain his upper abdomen intermittently. States that he has had improvement of his pain with the use of PRN Pepcid . He may have some pain when eats certain types of food (greasy foods and beans, or foods that cause gas production).  The patient denies having any nausea, vomiting, fever, chills, hematochezia, melena, hematemesis, abdominal distention, diarrhea, jaundice, pruritus. Haqs been gaining weight recently.  Last labs were performed on 09/04/23.  CMP with creatinine 1.30, BUN 18, normal electrolytes, AST 27, ALT 24, lipase 42, alkaline phosphatase 80, total bili 0.8, CBC with WBC 6.0, platelets 249 and hemoglobin 14.0.  Last Colonoscopy: 2019 - One small polyp in the transverse colon. Biopsied - path TA. - Diverticulosis in the sigmoid colon. - Internal hemorrhoids.    Recommended repeat in 7 years  Past Medical History: Past Medical History:  Diagnosis Date   CHF (congestive heart failure) (HCC)    Colon polyps    Diabetes mellitus without complication (HCC)    Edema of both lower extremities    Fatty liver    Hypercholesteremia    Hypertension     Past Surgical History: Past Surgical History:  Procedure Laterality Date   COLONOSCOPY     COLONOSCOPY  04/21/2012   Procedure: COLONOSCOPY;  Surgeon: Ruby Corporal, MD;  [facility];  Service: Endoscopy;  Laterality: N/A;  1200   COLONOSCOPY N/A 09/09/2017   Procedure: COLONOSCOPY;  Surgeon: Ruby Corporal, MD;  [facility]: [facility];  Service: Endoscopy;  Laterality: N/A;  1200-rescheduled to 3/14/ @12 :00pm per Lorelee Roger   HERNIA REPAIR     POLYPECTOMY  09/09/2017   Procedure: POLYPECTOMY;  Surgeon: Ruby Corporal, MD;  [facility]: [facility];  Service: Endoscopy;;  Transverse Colon (CB)    Family History: Family History  Problem Relation Age of Onset   Hypertension Mother    Hypertension Father    Alcoholism Father    Obesity Father    Diabetes Sister    Hypertension Sister    Healthy Daughter    Healthy Son    Colon cancer Neg Hx     Social History: Social History   Tobacco Use  Smoking Status Former   Current packs/day: 0.50   Average packs/day: 0.5 packs/day for 20.0 years (10.0 ttl pk-yrs)   Types: Cigarettes  Smokeless Tobacco Never   Social History   Substance and Sexual Activity  Alcohol  Use Not  Currently   Social History   Substance and Sexual Activity  Drug Use No    Allergies: Allergies  Allergen Reactions   Lisinopril Cough    Medications: Current Outpatient Medications  Medication Sig Dispense Refill   atorvastatin  (LIPITOR) 40 MG tablet Take 1 tablet (40 mg total) by mouth daily. 90 tablet 3   blood glucose meter kit and supplies Dispense based on patient and insurance preference. Use up to four times daily as directed. (FOR ICD-10  E11.65) 1 each 0   chlorthalidone  (HYGROTON ) 25 MG tablet Take 1 tablet (25 mg total) by mouth daily. 90 tablet 3   famotidine  (PEPCID ) 20 MG tablet Take 1 tablet (20 mg total) by mouth daily. 30 tablet 0   glucose blood (ONETOUCH ULTRA) test strip USE 1 STRIP TO CHECK GLUCOSE THREE TIMES DAILY AS DIRECTED E11.65 300 each 1   hydrALAZINE  (APRESOLINE ) 50 MG tablet TAKE 1 TABLET BY MOUTH THREE TIMES DAILY 90 tablet 0   insulin  aspart protamine - aspart (NOVOLOG  70/30 FLEXPEN) (70-30) 100 UNIT/ML FlexPen Inject 40 Units into the skin 2 (two) times daily with a meal. 75 mL 3   Insulin  Pen Needle (BD PEN NEEDLE NANO 2ND GEN) 32G X 4 MM MISC 1 Package by Does not apply route 2 (two) times daily. 100 each 0   levothyroxine  (SYNTHROID ) 75 MCG tablet Take 1 tablet (75 mcg total) by mouth daily before breakfast. 90 tablet 1   losartan  (COZAAR ) 100 MG tablet Take 1 tablet (100 mg total) by mouth daily. 90 tablet 0   metoprolol  tartrate (LOPRESSOR ) 100 MG tablet Take 1 tablet (100 mg total) by mouth 2 (two) times daily. 180 tablet 0   OneTouch Delica Lancets 30G MISC 1 each by Does not apply route 3 (three) times daily. Use to check blood glucose three times daily 100 each 3   sildenafil  (VIAGRA ) 100 MG tablet Take 1/2 tab in combination with 10mg  of tadalafil . 30 tablet 11   tadalafil  (CIALIS ) 20 MG tablet Use either 1 tab po daily prn or 1/2 tab po with 1/2 tab sildenafil  100mg  po daily prn 6 tablet 11   Vitamin D , Ergocalciferol , (DRISDOL ) 1.25 MG (50000 UNIT) CAPS capsule Take 1 capsule (50,000 Units total) by mouth every 7 (seven) days. 12 capsule 0   No current facility-administered medications for this visit.    Review of Systems: GENERAL: negative for malaise, night sweats HEENT: No changes in hearing or vision, no nose bleeds or other nasal problems. NECK: Negative for lumps, goiter, pain and significant neck swelling RESPIRATORY: Negative for cough, wheezing CARDIOVASCULAR: Negative for chest  pain, leg swelling, palpitations, orthopnea GI: SEE HPI MUSCULOSKELETAL: Negative for joint pain or swelling, back pain, and muscle pain. SKIN: Negative for lesions, rash PSYCH: Negative for sleep disturbance, mood disorder and recent psychosocial stressors. HEMATOLOGY Negative for prolonged bleeding, bruising easily, and swollen nodes. ENDOCRINE: Negative for cold or heat intolerance, polyuria, polydipsia and goiter. NEURO: negative for tremor, gait imbalance, syncope and seizures. The remainder of the review of systems is noncontributory.   Physical Exam: BP 131/74 (BP Location: Left Arm, Patient Position: Sitting, Cuff Size: Large)   Pulse 68   Temp (!) 97.3 F (36.3 C) (Temporal)   Ht 5\' 8"  (1.727 m)   Wt 252 lb 4.8 oz (114.4 kg)   BMI 38.36 kg/m  GENERAL: The patient is AO x3, in no acute distress. HEENT: Head is normocephalic and atraumatic. EOMI are intact. Mouth is well hydrated and without  lesions. NECK: Supple. No masses LUNGS: Clear to auscultation. No presence of rhonchi/wheezing/rales. Adequate chest expansion HEART: RRR, normal s1 and s2. ABDOMEN: Soft, nontender, no guarding, no peritoneal signs, and nondistended. BS +.  Presence of a large ventral hernia. EXTREMITIES: Without any cyanosis, clubbing, rash, lesions or edema. NEUROLOGIC: AOx3, no focal motor deficit. SKIN: no jaundice, no rashes  Imaging/Labs: as above  I personally reviewed and interpreted the available labs, imaging and endoscopic files.  Impression and Plan: Gerald Evans is a 78 y.o. male with past medical history NASH with F3/F4 fibrosis, diabetes, IBS, hyperlipidemia, hypertension, who presents for follow up of NASH fibrosis.  Patient has had a longstanding history of Nash fibrosis without changes suggestive of further decompensation to cirrhosis.  He has had a hard time losing weight and has actually been gaining recently.  I emphasized the importance of achieving further weight loss to  prevent progression of his liver disease which he understood.  He would benefit from implementing a Mediterranean diet.  Will obtain surveillance labs today, but also will check ELF test as part of the investigations to determine his fibrosis staging and candidacy for Rezdiffra.  He has presented seldom episodes of abdominal pain without red flag signs.  He will benefit from continuing the use of Pepcid  as needed.  -Perform blood workup -Explained to the patient the etiology and consequences of his current liver disease. Patient was counseled about the benefit of implementing a Mediterranean diet and exercise plan to decrease at least 5% of weight. The patient understood about the importance of lifestyle changes to potentially reverse his liver involvement. -If the serology suggest ongoing advanced fibrosis, we will need to start on Rezdiffra 100 mg daily -Continue Pepcid  as needed for upper abdominal discomfort  All questions were answered.      Samantha Cress, MD Gastroenterology and Hepatology [facility]

## 2023-11-30 ENCOUNTER — Other Ambulatory Visit: Payer: Self-pay | Admitting: Internal Medicine

## 2023-11-30 LAB — CBC WITH DIFFERENTIAL/PLATELET
Basophils Absolute: 0 10*3/uL (ref 0.0–0.2)
Basos: 1 %
EOS (ABSOLUTE): 0.3 10*3/uL (ref 0.0–0.4)
Eos: 4 %
Hematocrit: 39.6 % (ref 37.5–51.0)
Hemoglobin: 12.1 g/dL — ABNORMAL LOW (ref 13.0–17.7)
Immature Grans (Abs): 0 10*3/uL (ref 0.0–0.1)
Immature Granulocytes: 0 %
Lymphocytes Absolute: 3 10*3/uL (ref 0.7–3.1)
Lymphs: 42 %
MCH: 23.7 pg — ABNORMAL LOW (ref 26.6–33.0)
MCHC: 30.6 g/dL — ABNORMAL LOW (ref 31.5–35.7)
MCV: 78 fL — ABNORMAL LOW (ref 79–97)
Monocytes Absolute: 0.6 10*3/uL (ref 0.1–0.9)
Monocytes: 9 %
Neutrophils Absolute: 3.2 10*3/uL (ref 1.4–7.0)
Neutrophils: 44 %
Platelets: 212 10*3/uL (ref 150–450)
RBC: 5.11 x10E6/uL (ref 4.14–5.80)
RDW: 16.9 % — ABNORMAL HIGH (ref 11.6–15.4)
WBC: 7.1 10*3/uL (ref 3.4–10.8)

## 2023-11-30 LAB — COMPREHENSIVE METABOLIC PANEL WITH GFR
ALT: 17 IU/L (ref 0–44)
AST: 20 IU/L (ref 0–40)
Albumin: 4.2 g/dL (ref 3.8–4.8)
Alkaline Phosphatase: 92 IU/L (ref 44–121)
BUN/Creatinine Ratio: 14 (ref 10–24)
BUN: 20 mg/dL (ref 8–27)
Bilirubin Total: 0.3 mg/dL (ref 0.0–1.2)
CO2: 19 mmol/L — ABNORMAL LOW (ref 20–29)
Calcium: 9.2 mg/dL (ref 8.6–10.2)
Chloride: 103 mmol/L (ref 96–106)
Creatinine, Ser: 1.47 mg/dL — ABNORMAL HIGH (ref 0.76–1.27)
Globulin, Total: 2.5 g/dL (ref 1.5–4.5)
Glucose: 86 mg/dL (ref 70–99)
Potassium: 3.9 mmol/L (ref 3.5–5.2)
Sodium: 139 mmol/L (ref 134–144)
Total Protein: 6.7 g/dL (ref 6.0–8.5)
eGFR: 49 mL/min/{1.73_m2} — ABNORMAL LOW (ref >59)

## 2023-11-30 LAB — ENHANCED LIVER FIBROSIS (ELF): ELF(TM) Score: 11.09 — ABNORMAL HIGH (ref <9.80)

## 2023-12-01 ENCOUNTER — Ambulatory Visit (INDEPENDENT_AMBULATORY_CARE_PROVIDER_SITE_OTHER): Payer: Self-pay | Admitting: Gastroenterology

## 2023-12-01 ENCOUNTER — Encounter (INDEPENDENT_AMBULATORY_CARE_PROVIDER_SITE_OTHER): Payer: Self-pay

## 2023-12-01 ENCOUNTER — Other Ambulatory Visit (INDEPENDENT_AMBULATORY_CARE_PROVIDER_SITE_OTHER): Payer: Self-pay

## 2023-12-01 DIAGNOSIS — K7581 Nonalcoholic steatohepatitis (NASH): Secondary | ICD-10-CM

## 2023-12-01 DIAGNOSIS — K7400 Hepatic fibrosis, unspecified: Secondary | ICD-10-CM

## 2023-12-01 MED ORDER — REZDIFFRA 100 MG PO TABS: 100.0000 mg | ORAL_TABLET | Freq: Every day | ORAL | 11 refills | Status: AC

## 2023-12-06 ENCOUNTER — Ambulatory Visit

## 2023-12-07 ENCOUNTER — Ambulatory Visit

## 2023-12-09 ENCOUNTER — Ambulatory Visit (INDEPENDENT_AMBULATORY_CARE_PROVIDER_SITE_OTHER): Payer: Medicare HMO | Admitting: Gastroenterology

## 2023-12-09 ENCOUNTER — Ambulatory Visit (INDEPENDENT_AMBULATORY_CARE_PROVIDER_SITE_OTHER)

## 2023-12-09 DIAGNOSIS — N138 Other obstructive and reflux uropathy: Secondary | ICD-10-CM

## 2023-12-09 DIAGNOSIS — N401 Enlarged prostate with lower urinary tract symptoms: Secondary | ICD-10-CM

## 2023-12-09 LAB — URINALYSIS, ROUTINE W REFLEX MICROSCOPIC
Bilirubin, UA: NEGATIVE
Glucose, UA: NEGATIVE
Ketones, UA: NEGATIVE
Leukocytes,UA: NEGATIVE
Nitrite, UA: NEGATIVE
Protein,UA: NEGATIVE
RBC, UA: NEGATIVE
Specific Gravity, UA: 1.01 (ref 1.005–1.030)
Urobilinogen, Ur: 0.2 mg/dL (ref 0.2–1.0)
pH, UA: 7.5 (ref 5.0–7.5)

## 2023-12-09 NOTE — Progress Notes (Addendum)
 Patient was made aware that his urinalysis for urine drop off was clear for infection and he chart was check for additional testing and there was nothing that indicated additional testing. Patient voiced understanding

## 2023-12-21 ENCOUNTER — Other Ambulatory Visit: Payer: Self-pay | Admitting: Internal Medicine

## 2023-12-21 ENCOUNTER — Other Ambulatory Visit (INDEPENDENT_AMBULATORY_CARE_PROVIDER_SITE_OTHER): Payer: Self-pay | Admitting: Gastroenterology

## 2023-12-21 DIAGNOSIS — E785 Hyperlipidemia, unspecified: Secondary | ICD-10-CM

## 2023-12-21 MED ORDER — ATORVASTATIN CALCIUM 20 MG PO TABS: 20.0000 mg | ORAL_TABLET | Freq: Every day | ORAL | 0 refills | Status: DC

## 2023-12-22 ENCOUNTER — Ambulatory Visit (INDEPENDENT_AMBULATORY_CARE_PROVIDER_SITE_OTHER): Admitting: Family Medicine

## 2023-12-22 ENCOUNTER — Encounter (INDEPENDENT_AMBULATORY_CARE_PROVIDER_SITE_OTHER): Payer: Self-pay | Admitting: Family Medicine

## 2023-12-22 VITALS — BP 138/74 | HR 94 | Temp 97.9°F | Ht 68.0 in | Wt 236.0 lb

## 2023-12-22 DIAGNOSIS — E1165 Type 2 diabetes mellitus with hyperglycemia: Secondary | ICD-10-CM

## 2023-12-22 DIAGNOSIS — K7581 Nonalcoholic steatohepatitis (NASH): Secondary | ICD-10-CM | POA: Diagnosis not present

## 2023-12-22 DIAGNOSIS — Z6835 Body mass index (BMI) 35.0-35.9, adult: Secondary | ICD-10-CM

## 2023-12-22 DIAGNOSIS — E669 Obesity, unspecified: Secondary | ICD-10-CM

## 2023-12-22 DIAGNOSIS — E559 Vitamin D deficiency, unspecified: Secondary | ICD-10-CM

## 2023-12-22 DIAGNOSIS — Z7985 Long-term (current) use of injectable non-insulin antidiabetic drugs: Secondary | ICD-10-CM

## 2023-12-22 DIAGNOSIS — Z794 Long term (current) use of insulin: Secondary | ICD-10-CM

## 2023-12-22 DIAGNOSIS — K7400 Hepatic fibrosis, unspecified: Secondary | ICD-10-CM

## 2023-12-22 MED ORDER — VITAMIN D (ERGOCALCIFEROL) 1.25 MG (50000 UNIT) PO CAPS: 50000.0000 [IU] | ORAL_CAPSULE | ORAL | 0 refills | Status: DC

## 2023-12-22 NOTE — Progress Notes (Signed)
 Gerald Evans, D.O.  ABFM, ABOM Specializing in Clinical Bariatric Medicine  Office located at: [address] [place], [place]  [address]   Assessment and Plan:  No orders of Gerald defined types were placed in this encounter.   Medications Discontinued During This Encounter  Medication Reason   Vitamin D , Ergocalciferol , (DRISDOL ) 1.25 MG (50000 UNIT) CAPS capsule Reorder     Meds ordered this encounter  Medications   Vitamin D , Ergocalciferol , (DRISDOL ) 1.25 MG (50000 UNIT) CAPS capsule    Sig: Take 1 capsule (50,000 Units total) by mouth every 7 (seven) days.    Dispense:  12 capsule    Refill:  0      FOR Gerald Evans of 36.8 Evans 35.0-35.9,adult -- Current Evans 35.89 Assessment & Plan: Since last office visit on 08/05/23 patient's muscle mass has increased by 0.6 lbs. Fat mass has decreased by 4.8 lbs. Total body water  has decreased by 1 lb.  Counseling done on how various foods will affect these numbers and how to maximize success  Total lbs lost to date: 6 lbs Total weight loss percentage to date: -2.48%    Recommended Dietary Goals Gerald Evans is currently in Gerald action stage of change. As such, his goal is to continue weight management plan.  He has agreed to: continue current plan   Behavioral Intervention We discussed Gerald following today: increasing lean protein intake to established goals, decreasing simple carbohydrates , avoiding skipping meals, increasing water  intake , and decreasing eating out or consumption of processed foods, and making healthy choices when eating convenient foods  Additional resources provided today: Handout on CAT 2 meal plan  Evidence-based interventions for health behavior change were utilized today including Gerald discussion of self monitoring techniques, problem-solving barriers and SMART goal setting techniques.   Regarding patient's less desirable eating habits and patterns, we employed Gerald  technique of small changes.   Pt will specifically work on: Gerald Evans    Recommended Physical Activity Goals Gerald Evans to work up to 300-450 minutes of moderate intensity aerobic activity a week and strengthening exercises 2-3 times per week for cardiovascular health, weight loss maintenance and preservation of muscle mass.   He has agreed to: Continue current level of physical activity  and Increase Gerald intensity, frequency or duration of strengthening exercises    Pharmacotherapy We both agreed to: Continue with current nutritional and behavioral strategies   ASSOCIATED CONDITIONS ADDRESSED TODAY:   Type 2 diabetes mellitus with hyperglycemia, with long-term current use of insulin  [facility]) Assessment & Plan: Lab Results  Component Value Date   HGBA1C 8.2 (A) 10/21/2023   HGBA1C 9.3 (A) 07/20/2023   HGBA1C 9.4 (A) 04/15/2023   INSULIN  25.6 (H) 07/23/2022    Relevant medications: Novolog  30 units BID and Ozempic  1 mg once weekly. He has been compliant with his Novolog  with good tolerance, however, he is not taking his Ozempic . He notes he is not eating enough protein or drinking adequate amounts of water  per day. Evans pt to work on compliance of Gerald medications prescribed to him. Encouraged pt to decrease his simple carb/sugar intake and limit his Evans eating. Encouraged pt to make his health a priority and reviewed how following his meal plan and exercising can help improve his chronic conditions. Avoid skipping meals. Increase his water  intake to ensure proper hydration on a daily basis.     Vitamin D   deficiency Assessment & Plan: Lab Results  Component Value Date   VD25OH 59.0 05/12/2023   VD25OH 35.4 03/17/2023   VD25OH 48.0 07/23/2022   Vit D was at goal when last checked in 04/2023. Pt is on ERGO 50K units once weekly with good compliance and tolerance. No acute concerns today. Continue current  supplementation regimen; will refill today.     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 Eartha OV notes from 11/13/ 23 and 11/29/23. Recent imaging at Gerald time showed presence of advanced fibrosis. He also gained weight which was believed to possibly be related to Gerald progression of his NASH fibrosis. He recently followed up with Dr. Eartha on 11/29/23 who noted his h of NASH fibrosis was without changes suggestive of further decompensation to cirrhosis.   Stressed Gerald importance of prioritizing his health and needs to do better. Aim to improve his control by decreasing simple carbs and sugars, increasing his lean protein intake, and limit Gerald frequency of eating out. At his request, reviewed Gerald physiological progression of liver disease and fibrosis, and provided education on how these diseases relate to his weight loss journey. .     Follow up:   Return in about 22 days (around 01/13/2024) for 3 week f/u on 7/17 @ 9:20 AM. Make annother appt if pt desires.SABRA He was informed of Gerald importance of frequent follow up visits to maximize his success with intensive lifestyle modifications for his multiple health conditions.  Subjective:   Chief complaint: Obesity [name]. He is on Gerald Category 2 Plan and states he is following his eating plan approximately 60% of Gerald time. He states he is walking/treadmill for 30 minutes 4 days per week and weight training for 15 minutes 4 days per week, which he started recently.   Interval History:  Gerald Evans is here for a follow up office visit. Since last OV on 08/05/23, he has lost 4 lbs. On 3/8 he went to Gerald ED for right sided chest pain, which was later dx as gastroenteritis. He admits to drinking  inadequate amounts of water  per day and not meeting his protein goals. He also reports a decreased appetite recently and has not been able to eat all his food on a daily basis. He is determined to improve his health with getting back on track with his meal plan and exercising. He recently started his current exercise routine.   Pharmacotherapy for weight loss: He is currently taking Ozempic  1 mg once weekly.   Review of Systems:  Pertinent positives were addressed with patient today.  Reviewed by clinician on day of visit: allergies, medications, problem list, medical history, surgical history, family history, social history, and previous encounter notes.  Weight Summary and Biometrics   Weight Lost Since Last Visit: 4lb  Weight Gained Since Last Visit: 0lb    Vitals Temp: 97.9 F (36.6 C) BP: 138/74 Pulse Rate: 94 SpO2: 98 %   Anthropometric Measurements Height: 5' 8 (1.727 m) Weight: 236 lb (107 kg) Evans (Calculated): 35.89 Weight at Last Visit: 240lb Weight Lost Since Last Visit: 4lb Weight Gained Since Last Visit: 0lb Starting Weight: 242lb Total Weight Loss (lbs): 6 lb (2.722 kg)   Body Composition  Body Fat %:  34.8 % Fat Mass (lbs): 82.2 lbs Muscle Mass (lbs): 146.6 lbs Total Body Water  (lbs): 110.6 lbs Visceral Fat Rating : 23   Other Clinical Data Fasting: No Labs: no Today's Visit #: 31 Starting Date: 03/04/21    Objective:   PHYSICAL EXAM: Blood pressure 138/74, pulse 94, temperature 97.9 F (36.6 C), height 5' 8 (1.727 m), weight 236 lb (107 kg), SpO2 98%. Body mass index is 35.88 kg/m.  General: he is overweight, cooperative and in no acute distress. PSYCH: Has normal mood, affect and thought process.   HEENT: EOMI, sclerae are anicteric. Lungs: Normal breathing effort, no conversational dyspnea. Extremities: Moves * 4 Neurologic: A and O * 3, good insight  DIAGNOSTIC DATA REVIEWED: BMET    Component Value Date/Time   NA 139  11/29/2023 1045   K 3.9 11/29/2023 1045   CL 103 11/29/2023 1045   CO2 19 (L) 11/29/2023 1045   GLUCOSE 86 11/29/2023 1045   GLUCOSE 154 (H) 09/04/2023 1413   BUN 20 11/29/2023 1045   CREATININE 1.47 (H) 11/29/2023 1045   CREATININE 1.00 03/18/2020 1321   CALCIUM  9.2 11/29/2023 1045   GFRNONAA 57 (L) 09/04/2023 1413   GFRNONAA 74 03/18/2020 1321   GFRAA 67 07/17/2020 0953   GFRAA 86 03/18/2020 1321   Lab Results  Component Value Date   HGBA1C 8.2 (A) 10/21/2023   HGBA1C 8.5 10/10/2019   Lab Results  Component Value Date   INSULIN  25.6 (H) 07/23/2022   Lab Results  Component Value Date   TSH 2.080 07/16/2023   CBC    Component Value Date/Time   WBC 7.1 11/29/2023 1045   WBC 9.5 09/04/2023 1413   RBC 5.11 11/29/2023 1045   RBC 6.03 (H) 09/04/2023 1413   HGB 12.1 (L) 11/29/2023 1045   HCT 39.6 11/29/2023 1045   PLT 212 11/29/2023 1045   MCV 78 (L) 11/29/2023 1045   MCH 23.7 (L) 11/29/2023 1045   MCH 23.2 (L) 09/04/2023 1413   MCHC 30.6 (L) 11/29/2023 1045   MCHC 31.8 09/04/2023 1413   RDW 16.9 (H) 11/29/2023 1045   Iron Studies    Component Value Date/Time   IRON 92 09/02/2023 0543   IRON 54 07/23/2022 0921   TIBC 275 09/02/2023 0543   TIBC 285 07/23/2022 0921   FERRITIN 100 09/02/2023 0543   FERRITIN 124 07/23/2022 0921   IRONPCTSAT 34 09/02/2023 0543   IRONPCTSAT 19 07/23/2022 0921   IRONPCTSAT 24 05/25/2016 0814   Lipid Panel     Component Value Date/Time   CHOL 114 09/03/2023 0311   CHOL 151 07/16/2023 1301   CHOL 163 05/11/2022 1217   TRIG 103 09/03/2023 0311   TRIG 125 05/11/2022 1217   HDL 38 (L) 09/03/2023 0311   HDL 46 07/16/2023 1301   CHOLHDL 3.0 09/03/2023 0311   VLDL 21 09/03/2023 0311   LDLCALC 55 09/03/2023 0311   LDLCALC 91 07/16/2023 1301   LDLCALC 74 03/18/2020 1321   Hepatic Function Panel     Component Value Date/Time   PROT 6.7 11/29/2023 1045   ALBUMIN 4.2 11/29/2023 1045   AST 20 11/29/2023 1045   ALT 17 11/29/2023  1045   ALKPHOS 92 11/29/2023 1045   BILITOT 0.3 11/29/2023 1045   BILIDIR 0.1 05/14/2017 1333   IBILI 0.2 05/14/2017 1333      Component Value Date/Time   TSH 2.080 07/16/2023 1301   Nutritional Lab Results  Component Value Date   VD25OH 59.0 05/12/2023   VD25OH  35.4 03/17/2023   VD25OH 48.0 07/23/2022    Attestations:   I, Vernell Forest, acting as a medical scribe for Gerald Jenkins, DO., have compiled all relevant documentation for today's office visit on behalf of Gerald Jenkins, DO, while in Gerald presence of [employer], DO.  I have spent 40 minutes in Gerald care of Gerald patient today including: direct face-to-face time with patient counseling him on Gerald progression of his NASH with fibrosis and how this relates to his weight loss journey. Also discussed importance of prioritizing his own health, reviewed healthy eating habits and strategies, reviewed notes from his specialist.   I have reviewed Gerald above documentation for accuracy and completeness, and I agree with Gerald above. Gerald JINNY Evans, D.O.  Gerald 21st Century Cures Act was signed into law in 2016 which includes Gerald topic of electronic health records.  This provides immediate access to information in MyChart.  This includes consultation notes, operative notes, office notes, lab results and pathology reports.  If you have any questions about what you read please let us  know at your next visit so we can discuss your concerns and take corrective action if need be.  We are right here with you.

## 2023-12-24 ENCOUNTER — Other Ambulatory Visit (INDEPENDENT_AMBULATORY_CARE_PROVIDER_SITE_OTHER): Payer: Self-pay

## 2023-12-24 DIAGNOSIS — N1831 Chronic kidney disease, stage 3a: Secondary | ICD-10-CM

## 2023-12-24 DIAGNOSIS — D509 Iron deficiency anemia, unspecified: Secondary | ICD-10-CM

## 2023-12-24 DIAGNOSIS — K7581 Nonalcoholic steatohepatitis (NASH): Secondary | ICD-10-CM

## 2023-12-24 DIAGNOSIS — K7400 Hepatic fibrosis, unspecified: Secondary | ICD-10-CM

## 2023-12-30 ENCOUNTER — Other Ambulatory Visit: Payer: Self-pay | Admitting: "Endocrinology

## 2023-12-30 DIAGNOSIS — E1165 Type 2 diabetes mellitus with hyperglycemia: Secondary | ICD-10-CM

## 2024-01-13 ENCOUNTER — Ambulatory Visit (INDEPENDENT_AMBULATORY_CARE_PROVIDER_SITE_OTHER): Admitting: Family Medicine

## 2024-01-20 ENCOUNTER — Ambulatory Visit (INDEPENDENT_AMBULATORY_CARE_PROVIDER_SITE_OTHER): Payer: Self-pay | Admitting: Gastroenterology

## 2024-01-20 LAB — COMPREHENSIVE METABOLIC PANEL WITH GFR
AG Ratio: 1.4 (calc) (ref 1.0–2.5)
ALT: 20 U/L (ref 9–46)
AST: 17 U/L (ref 10–35)
Albumin: 4.1 g/dL (ref 3.6–5.1)
Alkaline phosphatase (APISO): 85 U/L (ref 35–144)
BUN/Creatinine Ratio: 17 (calc) (ref 6–22)
BUN: 24 mg/dL (ref 7–25)
CO2: 26 mmol/L (ref 20–32)
Calcium: 9.5 mg/dL (ref 8.6–10.3)
Chloride: 105 mmol/L (ref 98–110)
Creat: 1.43 mg/dL — ABNORMAL HIGH (ref 0.70–1.28)
Globulin: 2.9 g/dL (ref 1.9–3.7)
Glucose, Bld: 140 mg/dL — ABNORMAL HIGH (ref 65–99)
Potassium: 3.4 mmol/L — ABNORMAL LOW (ref 3.5–5.3)
Sodium: 139 mmol/L (ref 135–146)
Total Bilirubin: 0.3 mg/dL (ref 0.2–1.2)
Total Protein: 7 g/dL (ref 6.1–8.1)
eGFR: 50 mL/min/1.73m2 — ABNORMAL LOW (ref >=60)

## 2024-01-20 LAB — CBC WITH DIFFERENTIAL/PLATELET
Absolute Lymphocytes: 2836 {cells}/uL (ref 850–3900)
Absolute Monocytes: 551 {cells}/uL (ref 200–950)
Basophils Absolute: 48 {cells}/uL (ref 0–200)
Basophils Relative: 0.7 %
Eosinophils Absolute: 143 {cells}/uL (ref 15–500)
Eosinophils Relative: 2.1 %
HCT: 39.1 % (ref 38.5–50.0)
Hemoglobin: 12 g/dL — ABNORMAL LOW (ref 13.2–17.1)
MCH: 23.3 pg — ABNORMAL LOW (ref 27.0–33.0)
MCHC: 30.7 g/dL — ABNORMAL LOW (ref 32.0–36.0)
MCV: 75.9 fL — ABNORMAL LOW (ref 80.0–100.0)
MPV: 10.6 fL (ref 7.5–12.5)
Monocytes Relative: 8.1 %
Neutro Abs: 3223 {cells}/uL (ref 1500–7800)
Neutrophils Relative %: 47.4 %
Platelets: 195 Thousand/uL (ref 140–400)
RBC: 5.15 Million/uL (ref 4.20–5.80)
RDW: 15.5 % — ABNORMAL HIGH (ref 11.0–15.0)
Total Lymphocyte: 41.7 %
WBC: 6.8 Thousand/uL (ref 3.8–10.8)

## 2024-02-04 ENCOUNTER — Other Ambulatory Visit: Payer: Self-pay | Admitting: Internal Medicine

## 2024-02-04 DIAGNOSIS — I10 Essential (primary) hypertension: Secondary | ICD-10-CM

## 2024-02-14 ENCOUNTER — Ambulatory Visit (INDEPENDENT_AMBULATORY_CARE_PROVIDER_SITE_OTHER): Admitting: Family Medicine

## 2024-02-14 ENCOUNTER — Other Ambulatory Visit: Payer: Self-pay | Admitting: Nurse Practitioner

## 2024-02-14 ENCOUNTER — Encounter (INDEPENDENT_AMBULATORY_CARE_PROVIDER_SITE_OTHER): Payer: Self-pay | Admitting: Family Medicine

## 2024-02-14 VITALS — BP 135/69 | HR 71 | Temp 98.0°F | Ht 68.0 in | Wt 242.0 lb

## 2024-02-14 DIAGNOSIS — Z6835 Body mass index (BMI) 35.0-35.9, adult: Secondary | ICD-10-CM

## 2024-02-14 DIAGNOSIS — Z6836 Body mass index (BMI) 36.0-36.9, adult: Secondary | ICD-10-CM | POA: Diagnosis not present

## 2024-02-14 DIAGNOSIS — E559 Vitamin D deficiency, unspecified: Secondary | ICD-10-CM

## 2024-02-14 DIAGNOSIS — E1169 Type 2 diabetes mellitus with other specified complication: Secondary | ICD-10-CM | POA: Diagnosis not present

## 2024-02-14 DIAGNOSIS — E669 Obesity, unspecified: Secondary | ICD-10-CM | POA: Diagnosis not present

## 2024-02-14 DIAGNOSIS — K760 Fatty (change of) liver, not elsewhere classified: Secondary | ICD-10-CM

## 2024-02-14 DIAGNOSIS — E65 Localized adiposity: Secondary | ICD-10-CM

## 2024-02-14 DIAGNOSIS — K7581 Nonalcoholic steatohepatitis (NASH): Secondary | ICD-10-CM

## 2024-02-14 DIAGNOSIS — Z7985 Long-term (current) use of injectable non-insulin antidiabetic drugs: Secondary | ICD-10-CM

## 2024-02-14 MED ORDER — VITAMIN D (ERGOCALCIFEROL) 1.25 MG (50000 UNIT) PO CAPS: 50000.0000 [IU] | ORAL_CAPSULE | ORAL | 0 refills | Status: DC

## 2024-02-14 NOTE — Progress Notes (Signed)
 Gerald Evans, D.O.  ABFM, ABOM Specializing in Clinical Bariatric Medicine  Office located at: [address] [place], [place]  [address]   Assessment and Plan:   Medications Discontinued During This Encounter  Medication Reason   Vitamin D , Ergocalciferol , (DRISDOL ) 1.25 MG (50000 UNIT) CAPS capsule Reorder     Meds ordered this encounter  Medications   Vitamin D , Ergocalciferol , (DRISDOL ) 1.25 MG (50000 UNIT) CAPS capsule    Sig: Take 1 capsule (50,000 Units total) by mouth every 7 (seven) days.    Dispense:  12 capsule    Refill:  0     FOR THE DISEASE OF OBESITY:  BMI 35.0-35.9,adult -- Current BMI 36.8 Obesity with starting BMI of 36.8 Assessment & Plan: Since last office visit on 12/22/2023 patient's muscle mass has increased by 0.6 lbs. Fat mass has decreased by 5.2 lbs. Total body water  has increased by 1.6 lbs.  Body fat % has increased by 1.3 %. Counseling done on how various foods will affect these numbers and how to maximize success  Total lbs lost to date: 0 lbs Total weight loss percentage to date: 0 %   Recommended Dietary Goals Marqueze is currently in the action stage of change. As such, his goal is to continue weight management plan.  He has agreed to: continue current plan   Behavioral Intervention We discussed the following today: continue to work on maintaining a reduced calorie state, getting the recommended amount of protein, incorporating whole foods, making healthy choices, staying well hydrated and practicing mindfulness when eating.  Additional resources provided today: Handout on CAT 2 meal plan  Evidence-based interventions for health behavior change were utilized today including the discussion of self monitoring techniques, problem-solving barriers and SMART goal setting techniques.   Regarding patient's less desirable eating habits and patterns, we employed the technique of small changes.   Goal: eating everything on  plan   Recommended Physical Activity Goals Manu has been advised to work up to 300-450 minutes of moderate intensity aerobic activity a week and strengthening exercises 2-3 times per week for cardiovascular health, weight loss maintenance and preservation of muscle mass.   He may continue to gradually increase the amount and intensity of exercise routine   Pharmacotherapy Continue same regimen.    ASSOCIATED CONDITIONS ADDRESSED TODAY:   Type 2 diabetes mellitus with obesity [facility]) Assessment & Plan: Lab Results  Component Value Date   HGBA1C 8.2 (A) 10/21/2023   HGBA1C 9.3 (A) 07/20/2023   HGBA1C 9.4 (A) 04/15/2023   INSULIN  25.6 (H) 07/23/2022   Most recent HgbA1c was very poorly controlled for age and comorbid conditions. On Ozempic  1 mg weekly and Novolog  40 units BID. Denies symptoms of hypoglycemia or hyperglycemia. His fasting blood sugars average 135-140. He acknowledges eating many sweets when his son and granddaughter were visiting him recently.   Continue all medications. Continue working on nutrition plan to decrease simple carbohydrates, increase lean proteins and exercise to promote weight loss. Pt declines labs today and wants everything checked with Ms.Reardon in 3 weeks.     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  ELF test from 11/29/2023  was elevated to 11.09 and was consistent with advanced fibrosis. He was started on Rezdiffra  daily and a daily [place] multivitamin with iron. Counseled patient on the potential risk of progression to liver cirrhosis. Discussed the importance of intensive lifestyle modification including diet, exercise  and weight loss. Continue care per GI. Will continue to monitor alongside specialist.    Visceral obesity Assessment & Plan: Current visceral fat rating: 24.  The visceral fat rating  should be < 10 in a male.  Visceral adipose tissue is a hormonally active component of total body fat. This body composition phenotype is associated with medical disorders such as metabolic syndrome, cardiovascular disease and several malignancies including prostate, breast, and colorectal cancers. Goal: Lose 7-10% of weight via prudent nutritional plan and lifestyle changes.     Vitamin D  deficiency Assessment & Plan: Lab Results  Component Value Date   VD25OH 59.0 05/12/2023   VD25OH 35.4 03/17/2023   VD25OH 48.0 07/23/2022   Currently on Ergocalciferol  50,000 units once weekly with good compliance and tolerance. Continue regimen. He declines to have Vit D checked today and would like it checked when he obtains other labs during his OV with Ms.Reardon on 02/29/2024.    Follow up:   Return 03/16/2024 at 11:40 AM.  He was informed of the importance of frequent follow up visits to maximize his success with intensive lifestyle modifications for his multiple health conditions.   Subjective:   Chief complaint: Obesity [name] is here to discuss his progress with his obesity treatment plan. He is on the Category 2 Plan and states he is following his eating plan approximately 60% of the time.    Interval History:  [name] is here for a follow up office visit. Since last OV on 12/22/2023 , he is up 6 lbs. He attributes his weight gain to eating too many sweets (ice-cream, cakes, etc) when his son and grand-daughter was visiting him. Exercise wise,  he is walking 30 minutes 4 days per week    Pharmacotherapy that aid with weight loss: He is currently taking Ozempic  1 mg wkly.   Review of Systems:  Pertinent positives were addressed with patient today.  Reviewed by clinician on day of visit: allergies, medications, problem list, medical history, surgical history, family history, social history, and previous encounter notes.  Weight Summary and Biometrics   Weight Lost Since Last  Visit: 0  Weight Gained Since Last Visit: 6lb   Vitals Temp: 98 F (36.7 C) BP: 135/69 Pulse Rate: 71 SpO2: 98 %   Anthropometric Measurements Height: 5' 8 (1.727 m) Weight: 242 lb (109.8 kg) BMI (Calculated): 36.8 Weight at Last Visit: 236lb Weight Lost Since Last Visit: 0 Weight Gained Since Last Visit: 6lb Starting Weight: 242lb Total Weight Loss (lbs): 0 lb (0 kg)   Body Composition  Body Fat %: 36.1 % Fat Mass (lbs): 87.4 lbs Muscle Mass (lbs): 147.2 lbs Total Body Water  (lbs): 112.2 lbs Visceral Fat Rating : 24   Other Clinical Data Fasting: yes Labs: no Today's Visit #: 32 Starting Date: 03/04/21    Objective:   PHYSICAL EXAM: Blood pressure 135/69, pulse 71, temperature 98 F (36.7 C), height 5' 8 (1.727 m), weight 242 lb (109.8 kg), SpO2 98%. Body mass index is 36.8 kg/m.  General: he is overweight, cooperative and in no acute distress. PSYCH: Has normal mood, affect and thought process.   HEENT: EOMI, sclerae are anicteric. Lungs: Normal breathing effort, no conversational dyspnea. Extremities: Moves * 4 Neurologic: A and O * 3, good insight  DIAGNOSTIC DATA REVIEWED: BMET    Component Value Date/Time   NA 139 01/19/2024 1320   NA 139 11/29/2023 1045   K 3.4 (L) 01/19/2024 1320   CL 105 01/19/2024 1320  CO2 26 01/19/2024 1320   GLUCOSE 140 (H) 01/19/2024 1320   BUN 24 01/19/2024 1320   BUN 20 11/29/2023 1045   CREATININE 1.43 (H) 01/19/2024 1320   CALCIUM  9.5 01/19/2024 1320   GFRNONAA 57 (L) 09/04/2023 1413   GFRNONAA 74 03/18/2020 1321   GFRAA 67 07/17/2020 0953   GFRAA 86 03/18/2020 1321   Lab Results  Component Value Date   HGBA1C 8.2 (A) 10/21/2023   HGBA1C 8.5 10/10/2019   Lab Results  Component Value Date   INSULIN  25.6 (H) 07/23/2022   Lab Results  Component Value Date   TSH 2.080 07/16/2023   CBC    Component Value Date/Time   WBC 6.8 01/19/2024 1320   RBC 5.15 01/19/2024 1320   HGB 12.0 (L) 01/19/2024  1320   HGB 12.1 (L) 11/29/2023 1045   HCT 39.1 01/19/2024 1320   HCT 39.6 11/29/2023 1045   PLT 195 01/19/2024 1320   PLT 212 11/29/2023 1045   MCV 75.9 (L) 01/19/2024 1320   MCV 78 (L) 11/29/2023 1045   MCH 23.3 (L) 01/19/2024 1320   MCHC 30.7 (L) 01/19/2024 1320   RDW 15.5 (H) 01/19/2024 1320   RDW 16.9 (H) 11/29/2023 1045   Iron Studies    Component Value Date/Time   IRON 92 09/02/2023 0543   IRON 54 07/23/2022 0921   TIBC 275 09/02/2023 0543   TIBC 285 07/23/2022 0921   FERRITIN 100 09/02/2023 0543   FERRITIN 124 07/23/2022 0921   IRONPCTSAT 34 09/02/2023 0543   IRONPCTSAT 19 07/23/2022 0921   IRONPCTSAT 24 05/25/2016 0814   Lipid Panel     Component Value Date/Time   CHOL 114 09/03/2023 0311   CHOL 151 07/16/2023 1301   CHOL 163 05/11/2022 1217   TRIG 103 09/03/2023 0311   TRIG 125 05/11/2022 1217   HDL 38 (L) 09/03/2023 0311   HDL 46 07/16/2023 1301   CHOLHDL 3.0 09/03/2023 0311   VLDL 21 09/03/2023 0311   LDLCALC 55 09/03/2023 0311   LDLCALC 91 07/16/2023 1301   LDLCALC 74 03/18/2020 1321   Hepatic Function Panel     Component Value Date/Time   PROT 7.0 01/19/2024 1320   PROT 6.7 11/29/2023 1045   ALBUMIN 4.2 11/29/2023 1045   AST 17 01/19/2024 1320   ALT 20 01/19/2024 1320   ALKPHOS 92 11/29/2023 1045   BILITOT 0.3 01/19/2024 1320   BILITOT 0.3 11/29/2023 1045   BILIDIR 0.1 05/14/2017 1333   IBILI 0.2 05/14/2017 1333      Component Value Date/Time   TSH 2.080 07/16/2023 1301   Nutritional Lab Results  Component Value Date   VD25OH 59.0 05/12/2023   VD25OH 35.4 03/17/2023   VD25OH 48.0 07/23/2022    Attestations:   I, Special Puri, acting as a Stage manager for [employer], DO., have compiled all relevant documentation for today's office visit on behalf of Gerald Jenkins, DO, while in the presence of [employer], DO.  I have spent 42 minutes in the care of the patient today including 36 minutes face-to-face assessing and  reviewing listed medical problems above as outlined in office visit note and providing nutritional and behavioral counseling as outlined in obesity care plan.   I have reviewed the above documentation for accuracy and completeness, and I agree with the above. Gerald JINNY Evans, D.O.  The 21st Century Cures Act was signed into law in 2016 which includes the topic of electronic health records.  This provides immediate access to information  in MyChart.  This includes consultation notes, operative notes, office notes, lab results and pathology reports.  If you have any questions about what you read please let us  know at your next visit so we can discuss your concerns and take corrective action if need be.  We are right here with you.

## 2024-02-15 ENCOUNTER — Telehealth: Payer: Self-pay | Admitting: Nurse Practitioner

## 2024-02-15 NOTE — Telephone Encounter (Signed)
 Let pt know pt assistance of ozempic  is ready for pick up

## 2024-02-15 NOTE — Telephone Encounter (Signed)
Pt picked up pt assistance of ozempic

## 2024-02-21 ENCOUNTER — Ambulatory Visit: Admitting: Nurse Practitioner

## 2024-02-29 ENCOUNTER — Ambulatory Visit: Admitting: Nurse Practitioner

## 2024-02-29 DIAGNOSIS — E559 Vitamin D deficiency, unspecified: Secondary | ICD-10-CM

## 2024-02-29 DIAGNOSIS — Z7985 Long-term (current) use of injectable non-insulin antidiabetic drugs: Secondary | ICD-10-CM

## 2024-02-29 DIAGNOSIS — E782 Mixed hyperlipidemia: Secondary | ICD-10-CM

## 2024-02-29 DIAGNOSIS — I10 Essential (primary) hypertension: Secondary | ICD-10-CM

## 2024-02-29 DIAGNOSIS — Z7984 Long term (current) use of oral hypoglycemic drugs: Secondary | ICD-10-CM

## 2024-02-29 DIAGNOSIS — E039 Hypothyroidism, unspecified: Secondary | ICD-10-CM

## 2024-02-29 DIAGNOSIS — Z794 Long term (current) use of insulin: Secondary | ICD-10-CM

## 2024-03-01 ENCOUNTER — Other Ambulatory Visit: Payer: Self-pay | Admitting: Internal Medicine

## 2024-03-02 ENCOUNTER — Other Ambulatory Visit: Payer: Self-pay

## 2024-03-02 DIAGNOSIS — I10 Essential (primary) hypertension: Secondary | ICD-10-CM

## 2024-03-03 LAB — VITAMIN D 25 HYDROXY (VIT D DEFICIENCY, FRACTURES): Vit D, 25-Hydroxy: 75.1 ng/mL (ref 30.0–100.0)

## 2024-03-07 ENCOUNTER — Ambulatory Visit: Admitting: Nurse Practitioner

## 2024-03-07 DIAGNOSIS — E782 Mixed hyperlipidemia: Secondary | ICD-10-CM

## 2024-03-07 DIAGNOSIS — Z7984 Long term (current) use of oral hypoglycemic drugs: Secondary | ICD-10-CM

## 2024-03-07 DIAGNOSIS — I10 Essential (primary) hypertension: Secondary | ICD-10-CM

## 2024-03-07 DIAGNOSIS — Z794 Long term (current) use of insulin: Secondary | ICD-10-CM

## 2024-03-07 DIAGNOSIS — E1165 Type 2 diabetes mellitus with hyperglycemia: Secondary | ICD-10-CM

## 2024-03-07 DIAGNOSIS — Z7985 Long-term (current) use of injectable non-insulin antidiabetic drugs: Secondary | ICD-10-CM

## 2024-03-07 DIAGNOSIS — E039 Hypothyroidism, unspecified: Secondary | ICD-10-CM

## 2024-03-07 DIAGNOSIS — E559 Vitamin D deficiency, unspecified: Secondary | ICD-10-CM

## 2024-03-07 LAB — COMPREHENSIVE METABOLIC PANEL WITH GFR
ALT: 21 IU/L (ref 0–44)
ALT: 17 IU/L (ref 0–44)
AST: 23 IU/L (ref 0–40)
AST: 19 IU/L (ref 0–40)
Albumin: 4 g/dL (ref 3.8–4.8)
Albumin: 4 g/dL (ref 3.8–4.8)
Alkaline Phosphatase: 106 IU/L (ref 44–121)
Alkaline Phosphatase: 105 IU/L (ref 44–121)
BUN/Creatinine Ratio: 15 (ref 10–24)
BUN/Creatinine Ratio: 18 (ref 10–24)
BUN: 20 mg/dL (ref 8–27)
BUN: 25 mg/dL (ref 8–27)
Bilirubin Total: 0.2 mg/dL (ref 0.0–1.2)
Bilirubin Total: 0.3 mg/dL (ref 0.0–1.2)
CO2: 18 mmol/L — ABNORMAL LOW (ref 20–29)
CO2: 20 mmol/L (ref 20–29)
Calcium: 9.3 mg/dL (ref 8.6–10.2)
Calcium: 9 mg/dL (ref 8.6–10.2)
Chloride: 101 mmol/L (ref 96–106)
Chloride: 104 mmol/L (ref 96–106)
Creatinine, Ser: 1.37 mg/dL — ABNORMAL HIGH (ref 0.76–1.27)
Creatinine, Ser: 1.36 mg/dL — ABNORMAL HIGH (ref 0.76–1.27)
Globulin, Total: 2.7 g/dL (ref 1.5–4.5)
Globulin, Total: 2.5 g/dL (ref 1.5–4.5)
Glucose: 166 mg/dL — ABNORMAL HIGH (ref 70–99)
Glucose: 145 mg/dL — ABNORMAL HIGH (ref 70–99)
Potassium: 3.9 mmol/L (ref 3.5–5.2)
Potassium: 3.7 mmol/L (ref 3.5–5.2)
Sodium: 137 mmol/L (ref 134–144)
Sodium: 139 mmol/L (ref 134–144)
Total Protein: 6.7 g/dL (ref 6.0–8.5)
Total Protein: 6.5 g/dL (ref 6.0–8.5)
eGFR: 53 mL/min/1.73 — ABNORMAL LOW (ref >59)
eGFR: 54 mL/min/1.73 — ABNORMAL LOW (ref >59)

## 2024-03-07 LAB — T4, FREE
Free T4: 1.02 ng/dL (ref 0.82–1.77)
Free T4: 1.1 ng/dL (ref 0.82–1.77)

## 2024-03-07 LAB — TSH
TSH: 3.43 u[IU]/mL (ref 0.450–4.500)
TSH: 2.56 u[IU]/mL (ref 0.450–4.500)

## 2024-03-07 LAB — SPECIMEN STATUS REPORT

## 2024-03-10 ENCOUNTER — Other Ambulatory Visit: Payer: Self-pay | Admitting: Nurse Practitioner

## 2024-03-10 DIAGNOSIS — E038 Other specified hypothyroidism: Secondary | ICD-10-CM

## 2024-03-14 ENCOUNTER — Other Ambulatory Visit (INDEPENDENT_AMBULATORY_CARE_PROVIDER_SITE_OTHER): Payer: Self-pay | Admitting: Gastroenterology

## 2024-03-14 ENCOUNTER — Ambulatory Visit: Admitting: Nurse Practitioner

## 2024-03-14 ENCOUNTER — Encounter: Payer: Self-pay | Admitting: Nurse Practitioner

## 2024-03-14 VITALS — BP 132/60 | HR 77 | Ht 68.0 in | Wt 245.6 lb

## 2024-03-14 DIAGNOSIS — Z794 Long term (current) use of insulin: Secondary | ICD-10-CM | POA: Diagnosis not present

## 2024-03-14 DIAGNOSIS — E785 Hyperlipidemia, unspecified: Secondary | ICD-10-CM

## 2024-03-14 DIAGNOSIS — E1165 Type 2 diabetes mellitus with hyperglycemia: Secondary | ICD-10-CM | POA: Diagnosis not present

## 2024-03-14 DIAGNOSIS — Z7984 Long term (current) use of oral hypoglycemic drugs: Secondary | ICD-10-CM | POA: Diagnosis not present

## 2024-03-14 DIAGNOSIS — I10 Essential (primary) hypertension: Secondary | ICD-10-CM

## 2024-03-14 DIAGNOSIS — E039 Hypothyroidism, unspecified: Secondary | ICD-10-CM

## 2024-03-14 DIAGNOSIS — E782 Mixed hyperlipidemia: Secondary | ICD-10-CM

## 2024-03-14 DIAGNOSIS — Z7985 Long-term (current) use of injectable non-insulin antidiabetic drugs: Secondary | ICD-10-CM

## 2024-03-14 DIAGNOSIS — E559 Vitamin D deficiency, unspecified: Secondary | ICD-10-CM

## 2024-03-14 LAB — POCT GLYCOSYLATED HEMOGLOBIN (HGB A1C): Hemoglobin A1C: 7.9 % — AB (ref 4.0–5.6)

## 2024-03-14 MED ORDER — ATORVASTATIN CALCIUM 20 MG PO TABS: 20.0000 mg | ORAL_TABLET | Freq: Every day | ORAL | 0 refills | Status: DC

## 2024-03-14 MED ORDER — NOVOLIN 70/30 FLEXPEN (70-30) 100 UNIT/ML ~~LOC~~ SUPN: 40.0000 [IU] | PEN_INJECTOR | Freq: Two times a day (BID) | SUBCUTANEOUS | 0 refills | Status: DC

## 2024-03-14 NOTE — Telephone Encounter (Signed)
 I spoke with the patient and he says he does not have a pcp currently. He has an upcoming appointment on 05/01/2024 as a new patient with his new pcp. Patient would like to know if you can fill this to last until his New patient appointment with new pcp on 05/01/2024? If so please submit to [facility], [place].

## 2024-03-14 NOTE — Telephone Encounter (Signed)
 Thanks

## 2024-03-14 NOTE — Progress Notes (Signed)
 03/14/2024, 2:17 PM  Endocrinology follow-up note   Subjective:    Patient ID: Gerald Evans, male    DOB: 03-Oct-1945.  Gerald Evans is being seen in follow-up after he was seen in consultation for management of currently uncontrolled symptomatic diabetes requested by  Bevely Doffing, FNP.   Past Medical History:  Diagnosis Date   CHF (congestive heart failure) (HCC)    Colon polyps    Diabetes mellitus without complication (HCC)    Edema of both lower extremities    Fatty liver    Hypercholesteremia    Hypertension     Past Surgical History:  Procedure Laterality Date   COLONOSCOPY     COLONOSCOPY  04/21/2012   Procedure: COLONOSCOPY;  Surgeon: Claudis RAYMOND Rivet, MD;  [facility]: [facility];  Service: Endoscopy;  Laterality: N/A;  1200   COLONOSCOPY N/A 09/09/2017   Procedure: COLONOSCOPY;  Surgeon: Rivet Claudis RAYMOND, MD;  Location: [facility];  Service: Endoscopy;  Laterality: N/A;  1200-rescheduled to 3/14/ @12 :00pm per Jenkins   HERNIA REPAIR     POLYPECTOMY  09/09/2017   Procedure: POLYPECTOMY;  Surgeon: Rivet Claudis RAYMOND, MD;  Location: [facility];  Service: Endoscopy;;  Transverse Colon (CB)    Social History   Socioeconomic History   Marital status: Married    Spouse name: Not on file   Number of children: 2   Years of education: Not on file   Highest education level: Not on file  Occupational History   Occupation: retired  Tobacco Use   Smoking status: Former    Current packs/day: 0.50    Average packs/day: 0.5 packs/day for 20.0 years (10.0 ttl pk-yrs)    Types: Cigarettes   Smokeless tobacco: Never  Vaping Use   Vaping status: Never Used  Substance and Sexual Activity   Alcohol  use: Not Currently   Drug use: No   Sexual activity: Not on file  Other Topics Concern   Not on file  Social History Narrative   Not on file   Social Drivers of Health   Financial Resource  Strain: Low Risk  (11/18/2022)   Overall Financial Resource Strain (CARDIA)    Difficulty of Paying Living Expenses: Not hard at all  Food Insecurity: No Food Insecurity (09/06/2023)   Hunger Vital Sign    Worried About Running Out of Food in the Last Year: Never true    Ran Out of Food in the Last Year: Never true  Transportation Needs: No Transportation Needs (09/06/2023)   PRAPARE - Administrator, Civil Service (Medical): No    Lack of Transportation (Non-Medical): No  Physical Activity: Sufficiently Active (11/18/2022)   Exercise Vital Sign    Days of Exercise per Week: 7 days    Minutes of Exercise per Session: 30 min  Stress: No Stress Concern Present (11/18/2022)   [employer] of Occupational Health - Occupational Stress Questionnaire    Feeling of Stress : Not at all  Social Connections: Socially Integrated (09/02/2023)   Social Connection and Isolation Panel    Frequency of Communication with Friends and Family: More than three times a week  Frequency of Social Gatherings with Friends and Family: More than three times a week    Attends Religious Services: More than 4 times per year    Active Member of [employer] or Organizations: Yes    Attends Engineer, structural: More than 4 times per year    Marital Status: Married    Family History  Problem Relation Age of Onset   Hypertension Mother    Hypertension Father    Alcoholism Father    Obesity Father    Diabetes Sister    Hypertension Sister    Healthy Daughter    Healthy Son    Colon cancer Neg Hx     Outpatient Encounter Medications as of 03/14/2024  Medication Sig   atorvastatin  (LIPITOR) 20 MG tablet Take 1 tablet (20 mg total) by mouth daily.   blood glucose meter kit and supplies Dispense based on patient and insurance preference. Use up to four times daily as directed. (FOR ICD-10 E11.65)   chlorthalidone  (HYGROTON ) 25 MG tablet Take 1 tablet (25 mg total) by mouth daily.   famotidine   (PEPCID ) 20 MG tablet Take 1 tablet (20 mg total) by mouth daily.   glucose blood (ACCU-CHEK GUIDE TEST) test strip Use to check blood glucose twice daily as instructed   hydrALAZINE  (APRESOLINE ) 50 MG tablet TAKE 1 TABLET BY MOUTH THREE TIMES DAILY   insulin  aspart protamine - aspart (NOVOLOG  70/30 FLEXPEN) (70-30) 100 UNIT/ML FlexPen Inject 40 Units into the skin 2 (two) times daily with a meal.   insulin  isophane & regular human KwikPen (NOVOLIN  70/30 KWIKPEN) (70-30) 100 UNIT/ML KwikPen Inject 40 Units into the skin 2 (two) times daily before a meal.   Insulin  Pen Needle (BD PEN NEEDLE NANO 2ND GEN) 32G X 4 MM MISC 1 Package by Does not apply route 2 (two) times daily.   levothyroxine  (SYNTHROID ) 75 MCG tablet TAKE 1 TABLET BY MOUTH ONCE DAILY BEFORE BREAKFAST   losartan  (COZAAR ) 100 MG tablet Take 1 tablet by mouth once daily   metoprolol  tartrate (LOPRESSOR ) 100 MG tablet Take 1 tablet by mouth twice daily   OneTouch Delica Lancets 30G MISC 1 each by Does not apply route 3 (three) times daily. Use to check blood glucose three times daily   OVER THE COUNTER MEDICATION daily. [place] Multivitamin with Iron   Resmetirom  (REZDIFFRA ) 100 MG TABS Take 100 mg by mouth daily.   sildenafil  (VIAGRA ) 100 MG tablet Take 1/2 tab in combination with 10mg  of tadalafil .   tadalafil  (CIALIS ) 20 MG tablet Use either 1 tab po daily prn or 1/2 tab po with 1/2 tab sildenafil  100mg  po daily prn   Vitamin D , Ergocalciferol , (DRISDOL ) 1.25 MG (50000 UNIT) CAPS capsule Take 1 capsule (50,000 Units total) by mouth every 7 (seven) days.   [DISCONTINUED] atorvastatin  (LIPITOR) 20 MG tablet Take 1 tablet (20 mg total) by mouth daily.   No facility-administered encounter medications on file as of 03/14/2024.    ALLERGIES: Allergies  Allergen Reactions   Lisinopril Cough    VACCINATION STATUS: Immunization History  Administered Date(s) Administered   Fluad Trivalent(High Dose 65+) 05/07/2023   PFIZER  Comirnaty(Gray Top)Covid-19 Tri-Sucrose Vaccine 11/12/2020   PFIZER(Purple Top)SARS-COV-2 Vaccination 08/20/2019, 09/12/2019, 04/25/2020   PNEUMOCOCCAL CONJUGATE-20 01/06/2022   Pfizer Covid-19 Vaccine Bivalent Booster 63yrs & up 07/17/2021   Pneumococcal Polysaccharide-23 05/10/2018   Tdap 12/20/2017   Zoster Recombinant(Shingrix) 01/06/2022, 03/18/2022    Diabetes He presents for his follow-up diabetic visit. He has type 2 diabetes mellitus.  Onset time: He was diagnosed at approximate age of 46 years. His disease course has been improving. There are no hypoglycemic associated symptoms. Pertinent negatives for hypoglycemia include no confusion, nervousness/anxiousness, pallor or seizures. Associated symptoms include fatigue. Pertinent negatives for diabetes include no polydipsia, no polyphagia, no polyuria, no weakness and no weight loss. There are no hypoglycemic complications. Symptoms are stable. Diabetic complications include heart disease, impotence and nephropathy. Risk factors for coronary artery disease include dyslipidemia, diabetes mellitus, family history, tobacco exposure, sedentary lifestyle, male sex, obesity and hypertension. Current diabetic treatment includes oral agent (monotherapy) and insulin  injections (and Ozempic ). He is compliant with treatment most of the time. His weight is fluctuating minimally. He is following a generally unhealthy diet. When asked about meal planning, he reported none. He has not had a previous visit with a dietitian. He participates in exercise three times a week. His home blood glucose trend is fluctuating minimally. His overall blood glucose range is 140-180 mg/dl. (He presents today with his meter, no logs, showing inconsistent glucose monitoring (only checking glucose on average once daily), with slightly above target glycemic profile.  His POCT A1c today is 7.9%, improving from last visit of 8.2%.  Analysis of his meter shows 7-day average of 165, 14-day  average of 172, 30-day average of 162.  He admits he has over-indulged in foods on occasion but denies any hypoglycemia.  He notes he is nearly out of his premixed insulin , it did not come with his last PAP shipment.) An ACE inhibitor/angiotensin II receptor blocker is being taken. He does not see a podiatrist.Eye exam is current.  Thyroid Problem Presents for follow-up visit. Symptoms include fatigue. Patient reports no anxiety, cold intolerance, constipation, depressed mood, hair loss, leg swelling, weight gain or weight loss. The symptoms have been improving. Past treatments include nothing. His past medical history is significant for obesity. There are no known risk factors.    Review of systems  Constitutional: + minimally fluctuating body weight,  current Body mass index is 37.34 kg/m. , no fatigue, no subjective hyperthermia, no subjective hypothermia Eyes: no blurry vision, no xerophthalmia ENT: no sore throat, no nodules palpated in throat, no dysphagia/odynophagia, no hoarseness Cardiovascular: no chest pain, no shortness of breath, no palpitations, no leg swelling Respiratory: no cough, no shortness of breath Gastrointestinal: no nausea/vomiting/diarrhea Musculoskeletal: no muscle/joint aches Skin: no rashes, no hyperemia Neurological: no tremors, no numbness, no tingling, no dizziness Psychiatric: no depression, no anxiety  Objective:    BP 132/60 (BP Location: Left Arm, Patient Position: Sitting, Cuff Size: Large)   Pulse 77   Ht 5' 8 (1.727 m)   Wt 245 lb 9.6 oz (111.4 kg)   BMI 37.34 kg/m   Wt Readings from Last 3 Encounters:  03/14/24 245 lb 9.6 oz (111.4 kg)  02/14/24 242 lb (109.8 kg)  12/22/23 236 lb (107 kg)    BP Readings from Last 3 Encounters:  03/14/24 132/60  02/14/24 135/69  12/22/23 138/74     Physical Exam- Limited  Constitutional:  Body mass index is 37.34 kg/m. , not in acute distress, normal state of mind Eyes:  EOMI, no  exophthalmos Musculoskeletal: no gross deformities, strength intact in all four extremities, no gross restriction of joint movements Skin:  no rashes, no hyperemia Neurological: no tremor with outstretched hands  Diabetic Foot Exam - Simple   No data filed     CMP ( most recent) CMP     Component Value Date/Time   NA 139  03/06/2024 1031   K 3.7 03/06/2024 1031   CL 104 03/06/2024 1031   CO2 20 03/06/2024 1031   GLUCOSE 145 (H) 03/06/2024 1031   GLUCOSE 140 (H) 01/19/2024 1320   BUN 25 03/06/2024 1031   CREATININE 1.36 (H) 03/06/2024 1031   CREATININE 1.43 (H) 01/19/2024 1320   CALCIUM  9.0 03/06/2024 1031   PROT 6.5 03/06/2024 1031   ALBUMIN 4.0 03/06/2024 1031   AST 19 03/06/2024 1031   ALT 17 03/06/2024 1031   ALKPHOS 105 03/06/2024 1031   BILITOT 0.3 03/06/2024 1031   GFRNONAA 57 (L) 09/04/2023 1413   GFRNONAA 74 03/18/2020 1321   GFRAA 67 07/17/2020 0953   GFRAA 86 03/18/2020 1321    Diabetic Labs (most recent): Lab Results  Component Value Date   HGBA1C 7.9 (A) 03/14/2024   HGBA1C 8.2 (A) 10/21/2023   HGBA1C 9.3 (A) 07/20/2023   MICROALBUR 80mg /L 04/15/2023   MICROALBUR 80 mg/l 05/11/2022   MICROALBUR 150 04/15/2021     Assessment & Plan:   1) Uncontrolled type 2 diabetes mellitus with hyperglycemia (HCC)  - Gerald Evans has currently uncontrolled symptomatic type 2 DM since  78 years of age.  He presents today with his meter, no logs, showing inconsistent glucose monitoring (only checking glucose on average once daily), with slightly above target glycemic profile.  His POCT A1c today is 7.9%, improving from last visit of 8.2%.  Analysis of his meter shows 7-day average of 165, 14-day average of 172, 30-day average of 162.  He admits he has over-indulged in foods on occasion but denies any hypoglycemia.  He notes he is nearly out of his premixed insulin , it did not come with his last PAP shipment.   Recent labs reviewed.  Kidney function is declining  some, will stop Metformin  and recheck prior to next visit.  - I had a long discussion with him about the progressive nature of diabetes and the pathology behind its complications.  -his diabetes is complicated by obesity/sedentary life and he remains at a high risk for more acute and chronic complications which include CAD, CVA, CKD, retinopathy, and neuropathy. These are all discussed in detail with him.  - Nutritional counseling repeated at each appointment due to patients tendency to fall back in to old habits.  - The patient admits there is a room for improvement in their diet and drink choices. -  Suggestion is made for the patient to avoid simple carbohydrates from their diet including Cakes, Sweet Desserts / Pastries, Ice Cream, Soda (diet and regular), Sweet Tea, Candies, Chips, Cookies, Sweet Pastries, Store Bought Juices, Alcohol  in Excess of 1-2 drinks a day, Artificial Sweeteners, Coffee Creamer, and Sugar-free Products. This will help patient to have stable blood glucose profile and potentially avoid unintended weight gain.   - I encouraged the patient to switch to unprocessed or minimally processed complex starch and increased protein intake (animal or plant source), fruits, and vegetables.   - Patient is advised to stick to a routine mealtimes to eat 3 meals a day and avoid unnecessary snacks (to snack only to correct hypoglycemia).  - I have approached him with the following individualized plan to manage  his diabetes and patient agrees:   - he will continue to require insulin  treatment in order for him to achieve and maintain control of diabetes to target.    -He is benefiting from simplified insulin  regimen with premixed insulin .    -He is advised to continue his 70/30 40 units  SQ BID with meals if glucose is above 90 and he is eating and will increase his Ozempic  to 1 mg SQ weekly (when he gets this from PAP).  He notes he is on his last pen of 70/30.  I did send some to  local pharmacy just in case we are unable to get his PAP meds here soon enough.  -He is encouraged to do better monitoring his blood glucose.  He is urged to monitor glucose at least twice per day, before injecting insulin  (at breakfast and supper) and call the clinic if readings are less than 70 or greater than 300 for 3 tests in a row.  He could benefit from CGM device but is not interested at this time.  - Specific targets for  A1c;  LDL, HDL,  and Triglycerides were discussed with the patient.  2) Blood Pressure /Hypertension:  His blood pressure is controlled to target for his age.  He is advised to continue Clonidine  0.2 mg po twice daily, HCTZ 25 mg po daily, Losartan  100 mg po daily, and Metoprolol  100 mg po twice daily.    3) Lipids/Hyperlipidemia:  His most recent lipid panel from 09/03/23 shows controlled LDL of 55.  He is advised to continue Simvastatin  20 mg po daily at bedtime.  Side effects and precautions discussed with him.    4)  Weight/Diet:  His Body mass index is 37.34 kg/m.  -   clearly complicating his diabetes care.   he is  a candidate for weight loss. I discussed with him the fact that loss of 5 - 10% of his  current body weight will have the most impact on his diabetes management.  Exercise, and detailed carbohydrates information provided  -  detailed on discharge instructions.  5) Hypothyroidism-acquired He does not have personal or family history of thyroid dysfunction.  His antibody testing was negative ruling out autoimmune thyroid dysfunction.    His previsit TFTs are consistent with appropriate hormone replacement.  He is advised to continue his Levothyroxine  75 mcg po daily before breakfast.     - The correct intake of thyroid hormone (Levothyroxine , Synthroid ), is on empty stomach first thing in the morning, with water , separated by at least 30 minutes from breakfast and other medications,  and separated by more than 4 hours from calcium , iron, multivitamins,  acid reflux medications (PPIs).  - This medication is a life-long medication and will be needed to correct thyroid hormone imbalances for the rest of your life.  The dose may change from time to time, based on thyroid blood work.  - It is extremely important to be consistent taking this medication, near the same time each morning.  -AVOID TAKING PRODUCTS CONTAINING BIOTIN (commonly found in Hair, Skin, Nails vitamins) AS IT INTERFERES WITH THE VALIDITY OF THYROID FUNCTION BLOOD TESTS.  6) Chronic Care/Health Maintenance: -he is on ACEI/ARB and Statin medications and is encouraged to initiate and continue to follow up with Ophthalmology, Dentist,  Podiatrist at least yearly or according to recommendations, and advised to  stay away from smoking. I have recommended yearly flu vaccine and pneumonia vaccine at least every 5 years; moderate intensity exercise for up to 150 minutes weekly; and  sleep for at least 7 hours a day.  - he is advised to maintain close follow up with Bevely Doffing, FNP for primary care needs, as well as his other providers for optimal and coordinated care.     I spent  37  minutes in  the care of the patient today including review of labs from CMP, Lipids, Thyroid Function, Hematology (current and previous including abstractions from other facilities); face-to-face time discussing  his blood glucose readings/logs, discussing hypoglycemia and hyperglycemia episodes and symptoms, medications doses, his options of short and long term treatment based on the latest standards of care / guidelines;  discussion about incorporating lifestyle medicine;  and documenting the encounter. Risk reduction counseling performed per USPSTF guidelines to reduce obesity and cardiovascular risk factors.     Please refer to Patient Instructions for Blood Glucose Monitoring and Insulin /Medications Dosing Guide  in media tab for additional information. Please  also refer to  Patient Self Inventory in  the Media  tab for reviewed elements of pertinent patient history.  Gerald Evans participated in the discussions, expressed understanding, and voiced agreement with the above plans.  All questions were answered to his satisfaction. he is encouraged to contact clinic should he have any questions or concerns prior to his return visit.   Follow up plan: - Return in about 4 months (around 07/14/2024) for Diabetes F/U with A1c in office, No previsit labs, Bring meter and logs.  Benton Rio, [facility] [address] [place], [place] [address] Phone: [phone] Fax: [phone]  03/14/2024, 2:17 PM

## 2024-03-14 NOTE — Telephone Encounter (Signed)
 I spoke with the patient and made him aware  Medication refilled for 3 months    I did advise to make sure his new pcp takes this over. Patient states understanding and says he appreciates you filling for him.

## 2024-03-14 NOTE — Telephone Encounter (Signed)
 Medication refilled for 3 months Thanks

## 2024-03-14 NOTE — Telephone Encounter (Signed)
 Patient needs to request these refill to his PCP, I sent them once at lower dose as he is on Rezdiffra . Please ask him to reach PCP

## 2024-03-15 ENCOUNTER — Other Ambulatory Visit: Payer: Self-pay

## 2024-03-16 ENCOUNTER — Ambulatory Visit (INDEPENDENT_AMBULATORY_CARE_PROVIDER_SITE_OTHER): Admitting: Family Medicine

## 2024-03-20 ENCOUNTER — Telehealth: Payer: Self-pay | Admitting: Nurse Practitioner

## 2024-03-20 NOTE — Telephone Encounter (Signed)
 Called pt to let him know that his patient assistance is ready for pick up

## 2024-03-21 NOTE — Telephone Encounter (Signed)
 Pt picked up pt assistance of novolog 

## 2024-03-28 ENCOUNTER — Ambulatory Visit (INDEPENDENT_AMBULATORY_CARE_PROVIDER_SITE_OTHER): Admitting: Family Medicine

## 2024-03-28 ENCOUNTER — Encounter (INDEPENDENT_AMBULATORY_CARE_PROVIDER_SITE_OTHER): Payer: Self-pay | Admitting: Family Medicine

## 2024-03-28 DIAGNOSIS — E119 Type 2 diabetes mellitus without complications: Secondary | ICD-10-CM | POA: Insufficient documentation

## 2024-03-28 DIAGNOSIS — Z794 Long term (current) use of insulin: Secondary | ICD-10-CM

## 2024-03-28 DIAGNOSIS — E1122 Type 2 diabetes mellitus with diabetic chronic kidney disease: Secondary | ICD-10-CM | POA: Diagnosis not present

## 2024-03-28 DIAGNOSIS — E1169 Type 2 diabetes mellitus with other specified complication: Secondary | ICD-10-CM | POA: Diagnosis not present

## 2024-03-28 DIAGNOSIS — E669 Obesity, unspecified: Secondary | ICD-10-CM

## 2024-03-28 DIAGNOSIS — K7581 Nonalcoholic steatohepatitis (NASH): Secondary | ICD-10-CM | POA: Diagnosis not present

## 2024-03-28 DIAGNOSIS — E559 Vitamin D deficiency, unspecified: Secondary | ICD-10-CM | POA: Diagnosis not present

## 2024-03-28 DIAGNOSIS — Z7985 Long-term (current) use of injectable non-insulin antidiabetic drugs: Secondary | ICD-10-CM

## 2024-03-28 DIAGNOSIS — Z6835 Body mass index (BMI) 35.0-35.9, adult: Secondary | ICD-10-CM

## 2024-03-28 DIAGNOSIS — N1831 Chronic kidney disease, stage 3a: Secondary | ICD-10-CM

## 2024-03-28 DIAGNOSIS — E65 Localized adiposity: Secondary | ICD-10-CM | POA: Insufficient documentation

## 2024-03-28 DIAGNOSIS — Z6836 Body mass index (BMI) 36.0-36.9, adult: Secondary | ICD-10-CM

## 2024-03-28 MED ORDER — VITAMIN D (ERGOCALCIFEROL) 1.25 MG (50000 UNIT) PO CAPS: ORAL_CAPSULE | ORAL | 0 refills | Status: DC

## 2024-03-28 NOTE — Progress Notes (Signed)
 Gerald Evans, D.O.  ABFM, ABOM Specializing in Clinical Bariatric Medicine  Office located at: [address] [place], [place]  [address]      A) FOR THE CHRONIC DISEASE OF OBESITY:  Chief complaint: Obesity Gerald Evans is here to discuss his progress with his obesity treatment plan.   History of present illness / Interval history:  Gerald Evans is here today for his follow-up office visit.  Since last OV on 02/14/24, pt is down 1 lb. Pt states that he snacks at home a lot more than he should. He denies eating out because he does not like it.     02/14/24 12:00 03/28/24 10:00   Body Fat % 36.1 % 35.4 %  Muscle Mass (lbs) 147.2 lbs 148.2 lbs  Fat Mass (lbs) 87.4 lbs 85.6 lbs  Total Body Water  (lbs) 112.2 lbs 110.4 lbs   Counseling done on how various foods will affect these numbers and how to maximize success   Total lbs lost to date: - 1 lbs Total Fat Mass in lbs lost to date: + 2.2 lbs Total weight loss percentage to date: - 0.41 %    Obesity with starting BMI of 36.8 BMI 35.0-35.9,adult -- Current BMI 36.65  Nutrition Therapy He is on the Category 2 Plan and states he is following his eating plan approximately 60 % of the time.   - Tracking Calories/Macros: no   - Eating More Whole Foods: yes  - Adequate Protein Intake: no   - Adequate Water  Intake: yes  - Skipping Meals: no   - Sleeping 7-9 Hours/ Night: yes   Gerald Evans is currently in the action stage of change. As such, his goal is to continue weight management plan.  He has agreed to: continue current plan   Physical Activity Pt is on the treadmill 35 minutes 4 days per week   Gerald Evans has been advised to work up to 300-450 minutes of moderate intensity aerobic activity a week and strengthening exercises 2-3 times per week for cardiovascular health, weight loss maintenance and preservation of muscle mass.  He has agreed to : Think about enjoyable ways to increase daily physical activity and  overcoming barriers to exercise, Increase physical activity in their day and reduce sedentary time (increase NEAT)., Increase volume of physical activity to a goal of 240 minutes a week, and Combine aerobic and strengthening exercises for efficiency and improved cardiometabolic health.   Behavioral Modifications Evidence-based interventions for health behavior change were utilized today including the discussion of  1) self monitoring techniques:    -Create supportive environment at home   - Exercise everyday   2) problem-solving barriers:    - Remove unhealthy snacks from home   3) self care:    4) SMART goals for next OV:    -Some form of exercise everyday   -Stick to plan most of the time.   Regarding patient's less desirable eating habits and patterns, we employed the technique of small changes.   We discussed the following today: increasing lean protein intake to established goals, decreasing simple carbohydrates , keeping healthy foods at home, and better snacking choices Additional resources provided today: None   Medical Interventions/ Pharmacotherapy Previous Bariatric surgery: n/a Pharmacotherapy for weight loss: He is currently taking Ozempic  1 mg weekly for medical weight loss.    We discussed various medication options to help Gerald Evans with his weight loss efforts and we both agreed to : Continue with current nutritional and behavioral strategies and  weight loss medication   B) OBESITY RELATED CONDITIONS ADDRESSED TODAY:  Type 2 diabetes mellitus with obesity Assessment & Plan Lab Results  Component Value Date   HGBA1C 7.9 (A) 03/14/2024   HGBA1C 8.2 (A) 10/21/2023   HGBA1C 9.3 (A) 07/20/2023   INSULIN  25.6 (H) 07/23/2022    On Ozempic  1 mg weekly and Novolog  40 units BID. Denies any symptoms of hypoglycemia or hyperglycemia. Pt states that he has good control of hunger and cravings but is still over eatings carbs. Pts A1C on 03/14/24 went down to 7.9. For it to be  under good control A1C should be under 6.4. Continue medications, following meal plans and decreasing simple carbs. Increase exercise as tolerated.     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   Labs reviewed. ELF test from 11/29/2023  was elevated to 11.09 and was consistent with advanced fibrosis. He was started on Rezdiffra  100 mg daily and a daily Flintstone multivitamin with iron.  Current visceral fat rating: 24.  The visceral fat rating should be < 10 in a male.  Visceral adipose tissue is a hormonally active component of total body fat. This body composition phenotype is associated with medical disorders such as metabolic syndrome, cardiovascular disease and several malignancies including prostate, breast, and colorectal cancers. Lose 7-10% of weight via prudent nutritional plan and lifestyle changes  Continue care per GI. Will continue to monitor alongside specialist.     Vitamin D  deficiency Assessment & Plan Lab Results  Component Value Date   VD25OH 75.1 03/02/2024   VD25OH 59.0 05/12/2023   VD25OH 35.4 03/17/2023   Reviewed labs obtained by PCP. Vit D levels are above goal. Pt taking ERGO 50K units weekly. With good compliance and tolerance. Will change to every 10 days.    Chronic kidney disease, stage 3a [facility]) Lab Results  Component Value Date   CREATININE 1.36 (H) 03/06/2024   BUN 25 03/06/2024   NA 139 03/06/2024   K 3.7 03/06/2024   CL 104 03/06/2024   CO2 20 03/06/2024      Component Value Date/Time   PROT 6.5 03/06/2024 1031   ALBUMIN 4.0 03/06/2024 1031   AST 19 03/06/2024 1031   ALT 17 03/06/2024 1031   ALKPHOS 105 03/06/2024 1031   BILITOT 0.3 03/06/2024 1031   BILIDIR 0.1 05/14/2017 1333   IBILI 0.2 05/14/2017 1333   Reviewed labs obtained by PCP. Answered any pertinent questions. Creatinine levels are at 1.37 levels are still elevated but significantly  better than previous labs show. Recommended to to find a specialist to help control and monitor levels.      Medications Discontinued During This Encounter  Medication Reason   Vitamin D , Ergocalciferol , (DRISDOL ) 1.25 MG (50000 UNIT) CAPS capsule Reorder     Meds ordered this encounter  Medications   Vitamin D , Ergocalciferol , (DRISDOL ) 1.25 MG (50000 UNIT) CAPS capsule    Sig: 1 po q 10 days    Dispense:  9 capsule    Refill:  0      Follow up:   Return 04/26/2024 at 10:40 AM  He was informed of the importance of frequent follow up visits to maximize his success with intensive lifestyle modifications for his multiple health conditions.   Weight Summary and Biometrics   Weight Lost Since Last Visit: 1lb  Weight Gained Since Last Visit: 0lb    Vitals Temp: 97.7 F (36.5 C) BP: 113/68 Pulse Rate: 68  SpO2: 97 %   Anthropometric Measurements Height: 5' 8 (1.727 m) Weight: 241 lb (109.3 kg) BMI (Calculated): 36.65 Weight at Last Visit: 242lb Weight Lost Since Last Visit: 1lb Weight Gained Since Last Visit: 0lb Starting Weight: 242lb Total Weight Loss (lbs): 1 lb (0.454 kg)   Body Composition  Body Fat %: 35.4 % Fat Mass (lbs): 85.6 lbs Muscle Mass (lbs): 148.2 lbs Total Body Water  (lbs): 110.4 lbs Visceral Fat Rating : 24   Other Clinical Data Fasting: no Labs: no Today's Visit #: 33 Starting Date: 03/04/21    Objective:   PHYSICAL EXAM: Blood pressure 113/68, pulse 68, temperature 97.7 F (36.5 C), height 5' 8 (1.727 m), weight 241 lb (109.3 kg), SpO2 97%. Body mass index is 36.64 kg/m.  General: he is overweight, cooperative and in no acute distress. PSYCH: Has normal mood, affect and thought process.   HEENT: EOMI, sclerae are anicteric. Lungs: Normal breathing effort, no conversational dyspnea. Extremities: Moves * 4 Neurologic: A and O * 3, good insight  DIAGNOSTIC DATA REVIEWED: BMET    Component Value Date/Time   NA 139  03/06/2024 1031   K 3.7 03/06/2024 1031   CL 104 03/06/2024 1031   CO2 20 03/06/2024 1031   GLUCOSE 145 (H) 03/06/2024 1031   GLUCOSE 140 (H) 01/19/2024 1320   BUN 25 03/06/2024 1031   CREATININE 1.36 (H) 03/06/2024 1031   CREATININE 1.43 (H) 01/19/2024 1320   CALCIUM  9.0 03/06/2024 1031   GFRNONAA 57 (L) 09/04/2023 1413   GFRNONAA 74 03/18/2020 1321   GFRAA 67 07/17/2020 0953   GFRAA 86 03/18/2020 1321   Lab Results  Component Value Date   HGBA1C 7.9 (A) 03/14/2024   HGBA1C 8.5 10/10/2019   Lab Results  Component Value Date   INSULIN  25.6 (H) 07/23/2022   Lab Results  Component Value Date   TSH 2.560 03/06/2024   CBC    Component Value Date/Time   WBC 6.8 01/19/2024 1320   RBC 5.15 01/19/2024 1320   HGB 12.0 (L) 01/19/2024 1320   HGB 12.1 (L) 11/29/2023 1045   HCT 39.1 01/19/2024 1320   HCT 39.6 11/29/2023 1045   PLT 195 01/19/2024 1320   PLT 212 11/29/2023 1045   MCV 75.9 (L) 01/19/2024 1320   MCV 78 (L) 11/29/2023 1045   MCH 23.3 (L) 01/19/2024 1320   MCHC 30.7 (L) 01/19/2024 1320   RDW 15.5 (H) 01/19/2024 1320   RDW 16.9 (H) 11/29/2023 1045   Iron Studies    Component Value Date/Time   IRON 92 09/02/2023 0543   IRON 54 07/23/2022 0921   TIBC 275 09/02/2023 0543   TIBC 285 07/23/2022 0921   FERRITIN 100 09/02/2023 0543   FERRITIN 124 07/23/2022 0921   IRONPCTSAT 34 09/02/2023 0543   IRONPCTSAT 19 07/23/2022 0921   IRONPCTSAT 24 05/25/2016 0814   Lipid Panel     Component Value Date/Time   CHOL 114 09/03/2023 0311   CHOL 151 07/16/2023 1301   CHOL 163 05/11/2022 1217   TRIG 103 09/03/2023 0311   TRIG 125 05/11/2022 1217   HDL 38 (L) 09/03/2023 0311   HDL 46 07/16/2023 1301   CHOLHDL 3.0 09/03/2023 0311   VLDL 21 09/03/2023 0311   LDLCALC 55 09/03/2023 0311   LDLCALC 91 07/16/2023 1301   LDLCALC 74 03/18/2020 1321   Hepatic Function Panel     Component Value Date/Time   PROT 6.5 03/06/2024 1031   ALBUMIN 4.0 03/06/2024 1031   AST 19  03/06/2024 1031   ALT 17 03/06/2024 1031   ALKPHOS 105 03/06/2024 1031   BILITOT 0.3 03/06/2024 1031   BILIDIR 0.1 05/14/2017 1333   IBILI 0.2 05/14/2017 1333      Component Value Date/Time   TSH 2.560 03/06/2024 1031   Nutritional Lab Results  Component Value Date   VD25OH 75.1 03/02/2024   VD25OH 59.0 05/12/2023   VD25OH 35.4 03/17/2023    Attestations:   I, Sonny Laroche, acting as a Stage manager for Gerald Jenkins, DO., have compiled all relevant documentation for today's office visit on behalf of Gerald Jenkins, DO, while in the presence of [employer], DO.   I have reviewed the above documentation for accuracy and completeness, and I agree with the above. Gerald Evans, D.O.  The 21st Century Cures Act was signed into law in 2016 which includes the topic of electronic health records.  This provides immediate access to information in MyChart.  This includes consultation notes, operative notes, office notes, lab results and pathology reports.  If you have any questions about what you read please let us  know at your next visit so we can discuss your concerns and take corrective action if need be.  We are right here with you.

## 2024-03-30 ENCOUNTER — Other Ambulatory Visit: Payer: Self-pay | Admitting: Internal Medicine

## 2024-04-06 ENCOUNTER — Ambulatory Visit

## 2024-04-12 ENCOUNTER — Encounter (INDEPENDENT_AMBULATORY_CARE_PROVIDER_SITE_OTHER): Payer: Self-pay | Admitting: Gastroenterology

## 2024-04-26 ENCOUNTER — Encounter (INDEPENDENT_AMBULATORY_CARE_PROVIDER_SITE_OTHER): Payer: Self-pay | Admitting: Family Medicine

## 2024-04-26 ENCOUNTER — Ambulatory Visit (INDEPENDENT_AMBULATORY_CARE_PROVIDER_SITE_OTHER): Admitting: Family Medicine

## 2024-04-26 VITALS — BP 119/62 | HR 56 | Temp 97.6°F | Ht 68.0 in | Wt 243.0 lb

## 2024-04-26 DIAGNOSIS — Z7985 Long-term (current) use of injectable non-insulin antidiabetic drugs: Secondary | ICD-10-CM

## 2024-04-26 DIAGNOSIS — K7581 Nonalcoholic steatohepatitis (NASH): Secondary | ICD-10-CM

## 2024-04-26 DIAGNOSIS — E1165 Type 2 diabetes mellitus with hyperglycemia: Secondary | ICD-10-CM | POA: Diagnosis not present

## 2024-04-26 DIAGNOSIS — E1159 Type 2 diabetes mellitus with other circulatory complications: Secondary | ICD-10-CM

## 2024-04-26 DIAGNOSIS — Z6835 Body mass index (BMI) 35.0-35.9, adult: Secondary | ICD-10-CM

## 2024-04-26 DIAGNOSIS — I152 Hypertension secondary to endocrine disorders: Secondary | ICD-10-CM

## 2024-04-26 DIAGNOSIS — E65 Localized adiposity: Secondary | ICD-10-CM

## 2024-04-26 DIAGNOSIS — Z6836 Body mass index (BMI) 36.0-36.9, adult: Secondary | ICD-10-CM

## 2024-04-26 DIAGNOSIS — E669 Obesity, unspecified: Secondary | ICD-10-CM

## 2024-04-26 DIAGNOSIS — Z794 Long term (current) use of insulin: Secondary | ICD-10-CM

## 2024-04-26 NOTE — Progress Notes (Signed)
 Gerald Evans, D.O.  ABFM, ABOM Specializing in Clinical Bariatric Medicine  Office located at: [address] [place], [place]  [address]    FOR THE CHRONIC DISEASE OF OBESITY:   Obesity with starting BMI of 36.8; BMI 35.0-35.9,adult -- Current BMI 36.96  Weight Summary and Body Composition Analysis  Weight Lost Since Last Visit: 0  Weight Gained Since Last Visit: 2lb    Vitals Temp: 97.6 F (36.4 C) BP: 119/62 Pulse Rate: (!) 56 SpO2: 97 %   Anthropometric Measurements Height: 5' 8 (1.727 m) Weight: 243 lb (110.2 kg) BMI (Calculated): 36.96 Weight at Last Visit: 241lb Weight Lost Since Last Visit: 0 Weight Gained Since Last Visit: 2lb Starting Weight: 242lb Total Weight Loss (lbs): 0 lb (0 kg)   Body Composition  Body Fat %: 35.9 % Fat Mass (lbs): 87.4 lbs Muscle Mass (lbs): 148.4 lbs Total Body Water  (lbs): 112 lbs Visceral Fat Rating : 24   Other Clinical Data Fasting: yes Labs: no Today's Visit #: 34 Starting Date: 03/04/21    Chief complaint: Obesity  Interval History Gerald Evans is here for a follow-up office visit to discuss his progress with his obesity treatment plan. He is on the Category 2 Plan and states he is following his eating plan approximately 60 % of the time. He is going to the gym 60  minutes 3 days per week  He has experienced a weight gain of 2 lbs since last OV on 03/28/2024.   His dietary and life habits include:  - Tracking Calories/Macros: no - he is not on a journaling plan yet  - Eating More Whole Foods: yes  - Adequate Protein Intake: yes  - Adequate Water  Intake: yes  - Skipping Meals: no  - Sleeping 7-9 Hours/ Night: yes    03/28/24 10:00 04/26/24 10:00   Body Fat % 35.4 % 35.9 %  Muscle Mass (lbs) 148.2 lbs 148.4 lbs  Fat Mass (lbs) 85.6 lbs 87.4 lbs  Total Body Water  (lbs) 110.4 lbs 112 lbs  Visceral Fat Rating  24 24   Counseling done on how various foods will affect these numbers and  how to maximize success  Total lbs lost to date: + 1 lbs Total Fat Mass lost to date: + 4 lbs Total weight loss percentage to date: + 0.41 %   Nutritional and Behavioral Counseling:  We discussed the following today:  increasing lean protein intake to established goals, decreasing simple carbohydrates , and work on tracking and journaling calories using tracking application  Additional resources provided today: Physician provided patient with handouts and personalized instruction on tracking and journaling using Apps (or how to handwrite in notebook) and using logs provided   Evidence-based interventions for health behavior change were utilized today including the discussion of self monitoring techniques, problem-solving barriers and SMART goal setting techniques.   Regarding patient's less desirable eating habits and patterns, we employed the technique of small changes.   SMART Goal(s) created today: n/a   Recommended Dietary Goals [name] is currently in the action stage of change. As such, his goal is to continue weight management plan.  He has agreed to switch to a journaling plan of 1200 - 1400 cal and 90 + grams protein daily.   Recommended Physical Activity Goals [name] has been advised to work up to 300-450 minutes of moderate intensity aerobic activity a week and strengthening exercises 2-3 times per week for cardiovascular health, weight loss maintenance and preservation of muscle  mass.   He was encouraged to Continue to gradually increase the amount and intensity of exercise routine   Medical Interventions and Pharmacotherapy Previous Bariatric surgery: n/a Pharmacotherapy: cont Ozempic  at current dose   OBESITY RELATED CONDITIONS ADDRESSED TODAY:    Uncontrolled type 2 diabetes mellitus with hyperglycemia, with long-term current use of insulin  [facility]) Assessment & Plan: Lab Results  Component Value Date   HGBA1C 7.9 (A) 03/14/2024   HGBA1C 8.2 (A) 10/21/2023    HGBA1C 9.3 (A) 07/20/2023   INSULIN  25.6 (H) 07/23/2022    Reviewed most recent endocrinology note from 03/14/2024. At that time, Metformin  was discontinued b/c kidney function is declining some and Ozempic  was increased to 1 mg SQ weekly. Tolerating Ozempic  well. He also continues Novolog  70/30 40 units SQ BID. A1c has improved to 7.9; goal < 7. Fasting sugars average 138-140. He admits to overindulging in foods like cake on occasion but denies any hypoglycemia. Discussed the importance of decreasing simple carbs, increasing proteins/fiber and how certain foods they eat will affect their blood sugars. Increase exercise as able. Cont regimen per Endo.     Hypertension associated with type 2 diabetes mellitus [facility]) Assessment & Plan: BP Readings from Last 3 Encounters:  04/26/24 119/62  03/28/24 113/68  03/14/24 132/60   BP controlled today. No acute concerns. Cont Chlorthalidone  25 mg daily, Hydralazine  50 mg TID, Losartan  100 mg daily, and Metoprolol  100 mg twice daily. Continue with low sodium diet, advance exercise as tolerated.    NASH (nonalcoholic steatohepatitis) - with fibrosis/Visceral obesity Assessment & Plan: Managed by Dr.Mayorga of gastroenterology. On Resmetirom  100 mg daily and a daily Flintstone multivitamin with iron. Additionally, he has visceral obesity  with a current visceral fat rating of 24. Goal visceral rating < 10 for a male.These diagnoses were reviewed with the patient and education was provided. Continue care per gastroenterology. Continue goal of losing 7-10% of weight via prudent nutritional plan and lifestyle changes.     Objective:   PHYSICAL EXAM: Blood pressure 119/62, pulse (!) 56, temperature 97.6 F (36.4 C), height 5' 8 (1.727 m), weight 243 lb (110.2 kg), SpO2 97%. Body mass index is 36.95 kg/m.  General: he is overweight, cooperative and in no acute distress. PSYCH: Has normal mood, affect and thought process.   HEENT: EOMI, sclerae are  anicteric. Lungs: Normal breathing effort, no conversational dyspnea. Extremities: Moves * 4 Neurologic: A and O * 3, good insight  DIAGNOSTIC DATA REVIEWED: BMET    Component Value Date/Time   NA 139 03/06/2024 1031   K 3.7 03/06/2024 1031   CL 104 03/06/2024 1031   CO2 20 03/06/2024 1031   GLUCOSE 145 (H) 03/06/2024 1031   GLUCOSE 140 (H) 01/19/2024 1320   BUN 25 03/06/2024 1031   CREATININE 1.36 (H) 03/06/2024 1031   CREATININE 1.43 (H) 01/19/2024 1320   CALCIUM  9.0 03/06/2024 1031   GFRNONAA 57 (L) 09/04/2023 1413   GFRNONAA 74 03/18/2020 1321   GFRAA 67 07/17/2020 0953   GFRAA 86 03/18/2020 1321   Lab Results  Component Value Date   HGBA1C 7.9 (A) 03/14/2024   HGBA1C 8.5 10/10/2019   Lab Results  Component Value Date   INSULIN  25.6 (H) 07/23/2022   Lab Results  Component Value Date   TSH 2.560 03/06/2024   CBC    Component Value Date/Time   WBC 6.8 01/19/2024 1320   RBC 5.15 01/19/2024 1320   HGB 12.0 (L) 01/19/2024 1320   HGB 12.1 (L) 11/29/2023 1045  HCT 39.1 01/19/2024 1320   HCT 39.6 11/29/2023 1045   PLT 195 01/19/2024 1320   PLT 212 11/29/2023 1045   MCV 75.9 (L) 01/19/2024 1320   MCV 78 (L) 11/29/2023 1045   MCH 23.3 (L) 01/19/2024 1320   MCHC 30.7 (L) 01/19/2024 1320   RDW 15.5 (H) 01/19/2024 1320   RDW 16.9 (H) 11/29/2023 1045   Iron Studies    Component Value Date/Time   IRON 92 09/02/2023 0543   IRON 54 07/23/2022 0921   TIBC 275 09/02/2023 0543   TIBC 285 07/23/2022 0921   FERRITIN 100 09/02/2023 0543   FERRITIN 124 07/23/2022 0921   IRONPCTSAT 34 09/02/2023 0543   IRONPCTSAT 19 07/23/2022 0921   IRONPCTSAT 24 05/25/2016 0814   Lipid Panel     Component Value Date/Time   CHOL 114 09/03/2023 0311   CHOL 151 07/16/2023 1301   CHOL 163 05/11/2022 1217   TRIG 103 09/03/2023 0311   TRIG 125 05/11/2022 1217   HDL 38 (L) 09/03/2023 0311   HDL 46 07/16/2023 1301   CHOLHDL 3.0 09/03/2023 0311   VLDL 21 09/03/2023 0311   LDLCALC  55 09/03/2023 0311   LDLCALC 91 07/16/2023 1301   LDLCALC 74 03/18/2020 1321   Hepatic Function Panel     Component Value Date/Time   PROT 6.5 03/06/2024 1031   ALBUMIN 4.0 03/06/2024 1031   AST 19 03/06/2024 1031   ALT 17 03/06/2024 1031   ALKPHOS 105 03/06/2024 1031   BILITOT 0.3 03/06/2024 1031   BILIDIR 0.1 05/14/2017 1333   IBILI 0.2 05/14/2017 1333      Component Value Date/Time   TSH 2.560 03/06/2024 1031   Nutritional Lab Results  Component Value Date   VD25OH 75.1 03/02/2024   VD25OH 59.0 05/12/2023   VD25OH 35.4 03/17/2023     Follow up:   Return 06/01/2024 at 12:00 PM.  He was informed of the importance of frequent follow up visits to maximize his success with intensive lifestyle modifications for his multiple health conditions.   Attestations:   I, Special Puri, acting as a stage manager for [employer], DO., have compiled all relevant documentation for today's office visit on behalf of Gerald Jenkins, DO, while in the presence of [employer], DO.  Pertinent positives were addressed with patient today. Reviewed by clinician on day of visit: allergies, medications, problem list, medical history, surgical history, family history, social history, and previous encounter notes.  I have reviewed the above documentation for accuracy and completeness, and I agree with the above. Gerald Evans, D.O.  The 21st Century Cures Act was signed into law in 2016 which includes the topic of electronic health records.  This provides immediate access to information in MyChart. This includes consultation notes, operative notes, office notes, lab results and pathology reports.  If you have any questions about what you read please let us  know at your next visit so we can discuss your concerns and take corrective action if need be.  We are right here with you.

## 2024-05-01 ENCOUNTER — Ambulatory Visit

## 2024-05-12 ENCOUNTER — Ambulatory Visit: Payer: Self-pay

## 2024-05-12 ENCOUNTER — Ambulatory Visit

## 2024-05-12 NOTE — Telephone Encounter (Signed)
 FYI Only or Action Required?: FYI only for provider: appointment scheduled on 11/17.  Patient was last seen in primary care on 04/26/2024 by Midge Sober, DO.  Called Nurse Triage reporting Appointment.  Triage Disposition: See PCP Within 2 Weeks  Patient/caregiver understands and will follow disposition?: Yes      Copied from CRM #[id number]. Topic: Clinical - Red Word Triage >> May 12, 2024 12:25 PM Wess RAMAN wrote: Red Word that prompted transfer to Nurse Triage: Patient fell on 05/10/24 and is now in pain. He stated he twisted his knee and ankle as well as hitting his back and head.        Reason for Disposition  Nursing judgment or information in reference  Answer Assessment - Initial Assessment Questions 1. REASON FOR CALL: What is your main concern right now?     Patient reports he missed an appointment today and wanted to reschedule it. He denies any symptoms at this time.  Protocols used: No Guideline Available-A-AH

## 2024-05-12 NOTE — Telephone Encounter (Signed)
 Noted

## 2024-05-15 ENCOUNTER — Ambulatory Visit: Payer: Self-pay

## 2024-05-15 ENCOUNTER — Ambulatory Visit: Payer: Self-pay | Admitting: Physician Assistant

## 2024-05-15 NOTE — Telephone Encounter (Signed)
 scheduled

## 2024-05-15 NOTE — Telephone Encounter (Signed)
 Pt needs appt with [facility], states that he received a VM today canceling his appt today with [facility]. Please call pt back and schedule appropriate appts.  Copied from CRM #[id number]. Topic: Clinical - Red Word Triage >> May 15, 2024 11:48 AM Mia F wrote: Red Word that prompted transfer to Nurse Triage: PT was triaged last week for a fall. He was rescheduled by office stating e2c2 scheduled him with the wrong provider. He is still having pain and need to be seen. Not sure if the canceled appt was supposed to been an acute appt or follow up. Pt states he is still having pain in his ankle and waist

## 2024-05-17 ENCOUNTER — Ambulatory Visit (INDEPENDENT_AMBULATORY_CARE_PROVIDER_SITE_OTHER)

## 2024-05-17 VITALS — BP 115/59 | HR 67 | Ht 68.0 in | Wt 250.0 lb

## 2024-05-17 DIAGNOSIS — Z23 Encounter for immunization: Secondary | ICD-10-CM

## 2024-05-17 DIAGNOSIS — G8929 Other chronic pain: Secondary | ICD-10-CM

## 2024-05-17 DIAGNOSIS — M25551 Pain in right hip: Secondary | ICD-10-CM | POA: Diagnosis not present

## 2024-05-17 DIAGNOSIS — I10 Essential (primary) hypertension: Secondary | ICD-10-CM | POA: Diagnosis not present

## 2024-05-17 MED ORDER — MELOXICAM 7.5 MG PO TABS: 7.5000 mg | ORAL_TABLET | Freq: Every day | ORAL | 2 refills | Status: AC | PRN

## 2024-05-17 MED ORDER — HYDRALAZINE HCL 25 MG PO TABS: 25.0000 mg | ORAL_TABLET | Freq: Three times a day (TID) | ORAL | 2 refills | Status: AC

## 2024-05-17 MED ORDER — LOSARTAN POTASSIUM 100 MG PO TABS: 100.0000 mg | ORAL_TABLET | Freq: Every day | ORAL | 2 refills | Status: AC

## 2024-05-17 MED ORDER — CHLORTHALIDONE 25 MG PO TABS: 25.0000 mg | ORAL_TABLET | Freq: Every day | ORAL | 2 refills | Status: AC

## 2024-05-17 MED ORDER — COVID-19 MRNA VAC-TRIS(PFIZER) 30 MCG/0.3ML IM SUSY: 0.3000 mL | PREFILLED_SYRINGE | Freq: Once | INTRAMUSCULAR | 0 refills | Status: AC

## 2024-05-17 MED ORDER — METOPROLOL TARTRATE 100 MG PO TABS: 100.0000 mg | ORAL_TABLET | Freq: Two times a day (BID) | ORAL | 2 refills | Status: AC

## 2024-05-17 NOTE — Progress Notes (Signed)
 Established Patient Office Visit  Subjective   Patient ID: Gerald Evans, male    DOB: [birth year]  Age: 78 y.o. MRN: [id number]  No chief complaint on file.   HPI Discussed the use of AI scribe software for clinical note transcription with the patient, who gave verbal consent to proceed.  History of Present Illness    Gerald Evans is a 78 year old male with arthritis who presents with hip pain.  Right hip arthralgia - Pain localized to the right hip attributed to arthritis - Not currently taking medication for arthritis - Previously used meloxicam  with good effect  Hypertension and associated symptoms - Takes hydralazine  three times daily, losartan , and metoprolol  for blood pressure control - Blood pressure has been on the lower side - Low energy reported, possibly related to antihypertensive therapy  Diabetes mellitus and lower extremity symptoms - History of diabetes mellitus - Twisted ankle, currently improving - No numbness or tingling in the feet - Does not wear socks unless dressed up  Weight changes and fluid retention - No recent weight loss - Gained two pounds, possibly due to water  retention     Patient Active Problem List   Diagnosis Date Noted   Chronic right hip pain 05/19/2024   Type 2 diabetes mellitus with obesity 03/28/2024   Visceral obesity 03/28/2024   Upper abdominal pain 11/29/2023   Gastroenteritis 09/16/2023   NSTEMI (non-ST elevated myocardial infarction) (HCC) 09/01/2023   Microcytic anemia 09/01/2023   Chronic kidney disease, stage 3a (HCC) 05/07/2023   Need for influenza vaccination 05/07/2023   Onychomycosis 05/07/2023   Acquired hypothyroidism 09/29/2022   Erectile dysfunction 09/29/2022   Elevated PSA 09/29/2022   Encounter for general adult medical examination with abnormal findings 09/29/2022   Hypertension associated with diabetes (HCC) 09/07/2022   NASH (nonalcoholic steatohepatitis) 05/11/2022   Liver fibrosis 05/11/2022    DOE (dyspnea on exertion) 05/11/2022   Trigger finger, right middle finger 10/30/2021   Vitamin D  deficiency 06/24/2021   Eating disorder 06/24/2021   Class 2 severe obesity with serious comorbidity and body mass index (BMI) of 37.0 to 37.9 in adult 05/07/2021   Elevated TSH 03/05/2021   Uncontrolled type 2 diabetes mellitus with hyperglycemia, with long-term current use of insulin  (HCC) 12/19/2019   History of colonic polyps 05/13/2017   Diabetes mellitus (HCC) 04/21/2010   PURE HYPERCHOLESTEROLEMIA 04/21/2010   Mixed hyperlipidemia 04/21/2010   HYPOPOTASSEMIA 04/21/2010   Essential hypertension 04/21/2010   CORONARY ATHEROSCLEROSIS NATIVE CORONARY ARTERY 04/21/2010   PERS HX NONCOMPLIANCE W/MED TX PRS HAZARDS HLTH 04/21/2010   TOBACCO ABUSE, HX OF 04/21/2010    ROS    Objective:     BP (!) 115/59   Pulse 67   Ht 5' 8 (1.727 m)   Wt 250 lb 0.6 oz (113.4 kg)   SpO2 92%   BMI 38.02 kg/m  BP Readings from Last 3 Encounters:  05/17/24 (!) 115/59  04/26/24 119/62  03/28/24 113/68   Wt Readings from Last 3 Encounters:  05/17/24 250 lb 0.6 oz (113.4 kg)  04/26/24 243 lb (110.2 kg)  03/28/24 241 lb (109.3 kg)      Physical Exam Vitals and nursing note reviewed.  Constitutional:      Appearance: Normal appearance.  HENT:     Head: Normocephalic.  Eyes:     Extraocular Movements: Extraocular movements intact.     Pupils: Pupils are equal, round, and reactive to light.  Cardiovascular:     Rate and Rhythm:  Normal rate and regular rhythm.  Pulmonary:     Effort: Pulmonary effort is normal.     Breath sounds: Normal breath sounds.  Musculoskeletal:     Cervical back: Normal range of motion and neck supple.  Neurological:     Mental Status: He is alert and oriented to person, place, and time.  Psychiatric:        Mood and Affect: Mood normal.        Thought Content: Thought content normal.      No results found for any visits on 05/17/24.  Last CBC Lab  Results  Component Value Date   WBC 6.8 01/19/2024   HGB 12.0 (L) 01/19/2024   HCT 39.1 01/19/2024   MCV 75.9 (L) 01/19/2024   MCH 23.3 (L) 01/19/2024   RDW 15.5 (H) 01/19/2024   PLT 195 01/19/2024   Last metabolic panel Lab Results  Component Value Date   GLUCOSE 145 (H) 03/06/2024   NA 139 03/06/2024   K 3.7 03/06/2024   CL 104 03/06/2024   CO2 20 03/06/2024   BUN 25 03/06/2024   CREATININE 1.36 (H) 03/06/2024   EGFR 54 (L) 03/06/2024   CALCIUM  9.0 03/06/2024   PHOS 3.7 09/02/2023   PROT 6.5 03/06/2024   ALBUMIN 4.0 03/06/2024   LABGLOB 2.5 03/06/2024   AGRATIO 1.8 07/23/2022   BILITOT 0.3 03/06/2024   ALKPHOS 105 03/06/2024   AST 19 03/06/2024   ALT 17 03/06/2024   ANIONGAP 12 09/04/2023   Last lipids Lab Results  Component Value Date   CHOL 114 09/03/2023   HDL 38 (L) 09/03/2023   LDLCALC 55 09/03/2023   TRIG 103 09/03/2023   CHOLHDL 3.0 09/03/2023   Last hemoglobin A1c Lab Results  Component Value Date   HGBA1C 7.9 (A) 03/14/2024   Last thyroid functions Lab Results  Component Value Date   TSH 2.560 03/06/2024   T3TOTAL 98 03/04/2021   FREET4 1.10 03/06/2024   THYROIDAB <9 10/07/2021   Last vitamin D  Lab Results  Component Value Date   VD25OH 75.1 03/02/2024   Last vitamin B12 and Folate Lab Results  Component Value Date   VITAMINB12 422 07/23/2022   FOLATE 16.9 05/25/2016      The ASCVD Risk score (Arnett DK, et al., 2019) failed to calculate for the following reasons:   Risk score cannot be calculated because patient has a medical history suggesting prior/existing ASCVD    Assessment & Plan:   Problem List Items Addressed This Visit       Cardiovascular and Mediastinum   Essential hypertension - Primary   Blood pressure low, diastolic at 59. On four antihypertensives including hydralazine , losartan , and metoprolol . Low BP may cause low energy. - Lowered dose of hydralazine . - Continue hydralazine  three times a day. - Sent  refills for blood pressure medications to pharmacy.      Relevant Medications   chlorthalidone  (HYGROTON ) 25 MG tablet   hydrALAZINE  (APRESOLINE ) 25 MG tablet   losartan  (COZAAR ) 100 MG tablet   metoprolol  tartrate (LOPRESSOR ) 100 MG tablet     Other   Chronic right hip pain   Chronic right hip pain likely due to arthritis. Meloxicam  provided relief. Concerns about NSAID interactions and kidney function. - Prescribed meloxicam  as needed for hip pain.         Relevant Medications   meloxicam  (MOBIC ) 7.5 MG tablet   Other Visit Diagnoses       Encounter for immunization  Relevant Orders   Flu vaccine HIGH DOSE PF(Fluzone Trivalent) (Completed)      Return in about 6 months (around 11/14/2024) for chronic follow-up with PCP.    Leita Longs, FNP

## 2024-05-19 DIAGNOSIS — G8929 Other chronic pain: Secondary | ICD-10-CM | POA: Insufficient documentation

## 2024-05-19 NOTE — Assessment & Plan Note (Signed)
 Blood pressure low, diastolic at 59. On four antihypertensives including hydralazine , losartan , and metoprolol . Low BP may cause low energy. - Lowered dose of hydralazine . - Continue hydralazine  three times a day. - Sent refills for blood pressure medications to pharmacy.

## 2024-05-19 NOTE — Assessment & Plan Note (Signed)
 Chronic right hip pain likely due to arthritis. Meloxicam  provided relief. Concerns about NSAID interactions and kidney function. - Prescribed meloxicam  as needed for hip pain.

## 2024-05-22 ENCOUNTER — Other Ambulatory Visit

## 2024-05-22 DIAGNOSIS — R972 Elevated prostate specific antigen [PSA]: Secondary | ICD-10-CM

## 2024-05-23 ENCOUNTER — Ambulatory Visit: Payer: Self-pay

## 2024-05-23 LAB — PSA, TOTAL AND FREE
PSA, Free Pct: 16.7 %
PSA, Free: 1.44 ng/mL
Prostate Specific Ag, Serum: 8.6 ng/mL — ABNORMAL HIGH (ref 0.0–4.0)

## 2024-05-24 ENCOUNTER — Other Ambulatory Visit: Payer: Self-pay

## 2024-05-24 DIAGNOSIS — I10 Essential (primary) hypertension: Secondary | ICD-10-CM

## 2024-06-01 ENCOUNTER — Ambulatory Visit (INDEPENDENT_AMBULATORY_CARE_PROVIDER_SITE_OTHER): Admitting: Family Medicine

## 2024-06-04 ENCOUNTER — Other Ambulatory Visit: Payer: Self-pay | Admitting: Nurse Practitioner

## 2024-06-04 DIAGNOSIS — E038 Other specified hypothyroidism: Secondary | ICD-10-CM

## 2024-06-13 ENCOUNTER — Other Ambulatory Visit (INDEPENDENT_AMBULATORY_CARE_PROVIDER_SITE_OTHER): Payer: Self-pay | Admitting: Gastroenterology

## 2024-06-13 DIAGNOSIS — E785 Hyperlipidemia, unspecified: Secondary | ICD-10-CM

## 2024-06-13 NOTE — Telephone Encounter (Signed)
 Last seen by Dr. Eartha, on 11/29/2023

## 2024-06-14 ENCOUNTER — Other Ambulatory Visit (INDEPENDENT_AMBULATORY_CARE_PROVIDER_SITE_OTHER): Payer: Self-pay | Admitting: Gastroenterology

## 2024-06-14 DIAGNOSIS — E785 Hyperlipidemia, unspecified: Secondary | ICD-10-CM

## 2024-06-14 NOTE — Telephone Encounter (Signed)
 This should be refilled by PCP, I refilled it once in the past but we had previously advised to request it to primary physician. FYI Thanks

## 2024-06-14 NOTE — Telephone Encounter (Signed)
 I spoke with the patient and made him aware he will need to have this filled by PCP. Patient says he did not know why the pharmacy sent it to us  as he has asked the PCP to take this over. I also made him aware to let the PCP know he should not increase the atorvastatin  due to being on Rezdiffra , to keep atorvastatin  to 20 mg per day. Patient states understanding.

## 2024-06-14 NOTE — Telephone Encounter (Signed)
 So when he has his pcp to fill, should it be for the 20 mg daily or decrease to 10 mg daily?

## 2024-06-14 NOTE — Telephone Encounter (Signed)
 Should not increase dosage due to interaction with Rezdiffra  FYI

## 2024-06-14 NOTE — Telephone Encounter (Signed)
 No, keep the 20 mg (max dose)

## 2024-06-19 ENCOUNTER — Other Ambulatory Visit: Payer: Self-pay

## 2024-06-19 DIAGNOSIS — E785 Hyperlipidemia, unspecified: Secondary | ICD-10-CM

## 2024-06-19 MED ORDER — ATORVASTATIN CALCIUM 20 MG PO TABS: 20.0000 mg | ORAL_TABLET | Freq: Every day | ORAL | 3 refills | Status: AC

## 2024-06-21 ENCOUNTER — Other Ambulatory Visit (INDEPENDENT_AMBULATORY_CARE_PROVIDER_SITE_OTHER): Payer: Self-pay | Admitting: Family Medicine

## 2024-06-21 DIAGNOSIS — E559 Vitamin D deficiency, unspecified: Secondary | ICD-10-CM

## 2024-06-26 ENCOUNTER — Other Ambulatory Visit: Payer: Self-pay | Admitting: *Deleted

## 2024-06-28 ENCOUNTER — Other Ambulatory Visit: Payer: Self-pay | Admitting: *Deleted

## 2024-06-28 DIAGNOSIS — Z794 Long term (current) use of insulin: Secondary | ICD-10-CM

## 2024-06-28 DIAGNOSIS — E782 Mixed hyperlipidemia: Secondary | ICD-10-CM

## 2024-06-28 DIAGNOSIS — E559 Vitamin D deficiency, unspecified: Secondary | ICD-10-CM

## 2024-06-28 DIAGNOSIS — I10 Essential (primary) hypertension: Secondary | ICD-10-CM

## 2024-06-28 DIAGNOSIS — Z7985 Long-term (current) use of injectable non-insulin antidiabetic drugs: Secondary | ICD-10-CM

## 2024-06-28 DIAGNOSIS — E1165 Type 2 diabetes mellitus with hyperglycemia: Secondary | ICD-10-CM

## 2024-06-28 DIAGNOSIS — Z7984 Long term (current) use of oral hypoglycemic drugs: Secondary | ICD-10-CM

## 2024-06-28 DIAGNOSIS — E038 Other specified hypothyroidism: Secondary | ICD-10-CM

## 2024-06-28 DIAGNOSIS — E039 Hypothyroidism, unspecified: Secondary | ICD-10-CM

## 2024-06-28 MED ORDER — NOVOLIN 70/30 FLEXPEN (70-30) 100 UNIT/ML ~~LOC~~ SUPN: 40.0000 [IU] | PEN_INJECTOR | Freq: Two times a day (BID) | SUBCUTANEOUS | 0 refills | Status: DC

## 2024-07-06 ENCOUNTER — Ambulatory Visit (INDEPENDENT_AMBULATORY_CARE_PROVIDER_SITE_OTHER): Admitting: Family Medicine

## 2024-07-06 ENCOUNTER — Encounter (INDEPENDENT_AMBULATORY_CARE_PROVIDER_SITE_OTHER): Payer: Self-pay | Admitting: Family Medicine

## 2024-07-06 DIAGNOSIS — E559 Vitamin D deficiency, unspecified: Secondary | ICD-10-CM | POA: Diagnosis not present

## 2024-07-06 DIAGNOSIS — K7400 Hepatic fibrosis, unspecified: Secondary | ICD-10-CM

## 2024-07-06 DIAGNOSIS — Z6835 Body mass index (BMI) 35.0-35.9, adult: Secondary | ICD-10-CM | POA: Diagnosis not present

## 2024-07-06 DIAGNOSIS — E1165 Type 2 diabetes mellitus with hyperglycemia: Secondary | ICD-10-CM

## 2024-07-06 DIAGNOSIS — I10 Essential (primary) hypertension: Secondary | ICD-10-CM

## 2024-07-06 DIAGNOSIS — E669 Obesity, unspecified: Secondary | ICD-10-CM

## 2024-07-06 DIAGNOSIS — Z794 Long term (current) use of insulin: Secondary | ICD-10-CM | POA: Diagnosis not present

## 2024-07-06 DIAGNOSIS — N1831 Chronic kidney disease, stage 3a: Secondary | ICD-10-CM

## 2024-07-06 DIAGNOSIS — E1159 Type 2 diabetes mellitus with other circulatory complications: Secondary | ICD-10-CM

## 2024-07-06 MED ORDER — TIRZEPATIDE 2.5 MG/0.5ML ~~LOC~~ SOAJ: 2.5000 mg | SUBCUTANEOUS | 1 refills | Status: DC

## 2024-07-06 MED ORDER — VITAMIN D (ERGOCALCIFEROL) 1.25 MG (50000 UNIT) PO CAPS: ORAL_CAPSULE | ORAL | 0 refills | Status: AC

## 2024-07-06 NOTE — Progress Notes (Signed)
 "  Gerald Evans, D.O.  ABFM, ABOM Specializing in Clinical Bariatric Medicine  Office located at: [address] [place], [place]  [address]     Labs obtained today-   will review at next OV.  Orders Placed This Encounter  Procedures   Basic metabolic panel with GFR   VITAMIN D  25 Hydroxy (Vit-D Deficiency, Fractures)   Hemoglobin A1c    Medications Discontinued During This Encounter  Medication Reason   Vitamin D , Ergocalciferol , (DRISDOL ) 1.25 MG (50000 UNIT) CAPS capsule Reorder     Meds ordered this encounter  Medications   tirzepatide  (MOUNJARO ) 2.5 MG/0.5ML Pen    Sig: Inject 2.5 mg into the skin once a week.    Dispense:  2 mL    Refill:  1   Vitamin D , Ergocalciferol , (DRISDOL ) 1.25 MG (50000 UNIT) CAPS capsule    Sig: 1 po q 10 days    Dispense:  9 capsule    Refill:  0    A) FOR THE CHRONIC DISEASE OF OBESITY:  Chief complaint: Obesity Normon is here to discuss his progress with his obesity treatment plan.   History of present illness / Interval history:  [name] is a high risk individual with history of CAD s/p AMI, Liver Fibrosis, Poorly controlled DM with CKD here today for his follow-up office visit.  Since last OV on 04/26/24, pt is up 1 lb. Patient states that he eats late at night and has very poor choices at this time.  Denies adequate protein intake.   04/26/24 10:00 07/06/24   Body Fat % 35.9 % 35.8 %  Muscle Mass (lbs) 148.4 lbs 149.4 lbs  Fat Mass (lbs) 87.4 lbs 87.6 lbs  Total Body Water  (lbs) 112 lbs 113 lbs  Visceral Fat Rating  24 24   Total lbs lost to date: +2 lbs Total Fat Mass in lbs lost to date: + 4.2 lbs Total weight loss percentage to date: +0.83 %    Obesity with starting BMI of 36.8 BMI 35.0-35.9,adult -- Current BMI  Nutrition Therapy He is journaling plan of 1200 - 1400 cal and 90 + grams protein daily  and states he is following his eating plan approximately 60 % of the time.   - Tracking  Calories/Macros: no   - Eating More Whole Foods: yes  - Adequate Protein Intake: yes ( actually not)  - Adequate Water  Intake: yes  - Skipping Meals: no   - Sleeping 7-9 Hours/ Night: yes   [name] is currently in the action stage of change. As such, his goal is to continue weight management plan.  He has agreed to: continue current plan   Physical Activity Skeeter is going to the gym and walking 45  minutes 4 days per week   [name] has been advised to work up to 300-450 minutes of moderate intensity aerobic activity a week and strengthening exercises 2-3 times per week for cardiovascular health, weight loss maintenance and preservation of muscle mass.  He has agreed to : Increase volume of physical activity to a goal of 240 minutes a week and Combine aerobic and strengthening exercises for efficiency and improved cardiometabolic health.   Behavioral Modifications Evidence-based interventions for health behavior change were utilized today  Regarding patient's less desirable eating habits and patterns, we employed the technique of small changes.   We discussed the following today: increasing lean protein intake to established goals, avoiding skipping meals, increasing water  intake , work on meal planning and  preparation, decreasing eating out or consumption of processed foods, and making healthy choices when eating convenient foods, and avoiding temptations and identifying enticing environmental cues Additional resources provided today: None   Medical Interventions/ Pharmacotherapy Previous Bariatric surgery: n/a Pharmacotherapy for weight loss: He is not currently medication for medical weight loss.   Patient was previously on Ozempic  but has stopped use since November due to cost.  Patient was previously on Mounjaro  in distant past.  We discussed various medication options to help [place] with his weight loss efforts.  Due to his high risk medical conditions such as Poorly  controlled DM with CKD, Liver fibrosis, CAD s/p AMI, pt understands the risks of GLP-1 use esp in someone higher risk like himself.  No C/I identified.     The patient was counseled on potential risks, expected benefits, treatment duration, and potential adverse effects, including serious risks requiring monitoring. Management of prescription medication with potential for significant adverse effects places the patient at HIGH risk of morbidity without appropriate follow-up.  After discussion of treatment options, mechanisms of action, benefits, side effects, contraindications and shared decision making he is agreeable to starting Mounjaro  2.5 mg once weekly. Patient also made aware that medication is indicated for long-term management of obesity and the risk of weight regain following discontinuation of treatment and hence the importance of adhering to medical weight loss plan.   B) OBESITY RELATED CONDITIONS ADDRESSED TODAY:  Uncontrolled type 2 diabetes mellitus with hyperglycemia, with long-term current use of insulin  [facility]) Assessment & Plan Lab Results  Component Value Date   HGBA1C 7.9 (A) 03/14/2024   HGBA1C 8.2 (A) 10/21/2023   HGBA1C 9.3 (A) 07/20/2023   INSULIN  25.6 (H) 07/23/2022    Patient was previously on Ozempic  but has stopped the use since November due to cost. Explained to patient how an A1C under 7 would be controlled and do less damage to the kidneys. Patient states that he is checking his blood sugar levels and this morning they were 147 and yesterday they were 119 fasting. After eating they were 200. Explained to patient goal blood sugars, fasting blood sugars and after meal blood sugars. Patient was previously on Mounjaro  and would benefit being back on Mounjaro  for wt loss as well as his DM. Mutually agreed to begin Mounjaro  2.5 mg once weekly but pt is well aware that his DM needs to be followed closely by his ENDO doc.  Call for f/up appt.   Continue following meal plan and  decreasing simple carbs and sugars.   Will check labs today    Hypertension associated with type 2 diabetes mellitus [facility]) Assessment & Plan BP Readings from Last 3 Encounters:  07/06/24 (!) 142/64  05/17/24 (!) 115/59  04/26/24 119/62   The ASCVD Risk score (Arnett DK, et al., 2019) failed to calculate for the following reasons:   Risk score cannot be calculated because patient has a medical history suggesting prior/existing ASCVD   * - Cholesterol units were assumed  Lab Results  Component Value Date   CREATININE 1.36 (H) 03/06/2024   Chlorthalidone  25 mg daily, Hydralazine  25 mg TID, Losartan  100 mg daily, and Metoprolol  100 mg twice daily with reported good compliance and tolerance. BP today is not at goal it is on the high end - poorly controlled. Patient reports checking his BP at home every morning and both numbers are in the green they average 118 and 120. Reviewed OV noted from 05/17/24.  Patient feels tired and sluggish and wishes  to lower a medication in the future due to his heart rate in the 50s.    - Pt told to contact the treating providers for further direction on txmnt of his BP   - Recommended to pt to discuss with specialists/ PCP if Metoprolol  may be lowered if possible in the near future d/t pt sx and since it is an obesogenic medication. Pt verbalized understanding - will check labs today   Vitamin D  deficiency Assessment & Plan Lab Results  Component Value Date   VD25OH 75.1 03/02/2024   VD25OH 59.0 05/12/2023   VD25OH 35.4 03/17/2023   Taking ERGO 50K units once every 10 days with reported good compliance and tolerance. This was changed to a lower frequency d/t recent vit D lab results. No acute concerns. Will refill today and Check labs today     Medications Discontinued During This Encounter  Medication Reason   Vitamin D , Ergocalciferol , (DRISDOL ) 1.25 MG (50000 UNIT) CAPS capsule Reorder     Meds ordered this encounter  Medications    tirzepatide  (MOUNJARO ) 2.5 MG/0.5ML Pen    Sig: Inject 2.5 mg into the skin once a week.    Dispense:  2 mL    Refill:  1   Vitamin D , Ergocalciferol , (DRISDOL ) 1.25 MG (50000 UNIT) CAPS capsule    Sig: 1 po q 10 days    Dispense:  9 capsule    Refill:  0     Follow up:    Return 08/03/2024 at 11:40 AM  He was informed of the importance of frequent follow up visits to maximize his success with intensive lifestyle modifications for his multiple health conditions.   Weight Summary and Biometrics   Weight Lost Since Last Visit: 0lb  Weight Gained Since Last Visit: 1lb   Vitals Temp: 97.6 F (36.4 C) BP: (!) 142/64 Pulse Rate: 64 SpO2: 97 %   Anthropometric Measurements Height: 5' 8 (1.727 m) Weight: 244 lb (110.7 kg) BMI (Calculated): 37.11 Weight at Last Visit: 243lb Weight Lost Since Last Visit: 0lb Weight Gained Since Last Visit: 1lb Starting Weight: 242lb Total Weight Loss (lbs): 0 lb (0 kg)   Body Composition  Body Fat %: 35.8 % Fat Mass (lbs): 87.6 lbs Muscle Mass (lbs): 149.4 lbs Total Body Water  (lbs): 113 lbs Visceral Fat Rating : 24   Other Clinical Data Fasting: yes Labs: no Today's Visit #: 35 Starting Date: 03/04/21    Objective:   PHYSICAL EXAM: Blood pressure (!) 142/64, pulse 64, temperature 97.6 F (36.4 C), height 5' 8 (1.727 m), weight 244 lb (110.7 kg), SpO2 97%. Body mass index is 37.1 kg/m.  General: he is overweight, cooperative and in no acute distress. PSYCH: Has normal mood, affect and thought process.   HEENT: EOMI, sclerae are anicteric. Lungs: Normal breathing effort, no conversational dyspnea. Extremities: Moves * 4 Neurologic: A and O * 3, good insight  DIAGNOSTIC DATA REVIEWED: BMET    Component Value Date/Time   NA 139 03/06/2024 1031   K 3.7 03/06/2024 1031   CL 104 03/06/2024 1031   CO2 20 03/06/2024 1031   GLUCOSE 145 (H) 03/06/2024 1031   GLUCOSE 140 (H) 01/19/2024 1320   BUN 25 03/06/2024 1031    CREATININE 1.36 (H) 03/06/2024 1031   CREATININE 1.43 (H) 01/19/2024 1320   CALCIUM  9.0 03/06/2024 1031   GFRNONAA 57 (L) 09/04/2023 1413   GFRNONAA 74 03/18/2020 1321   GFRAA 67 07/17/2020 0953   GFRAA 86 03/18/2020 1321   Lab  Results  Component Value Date   HGBA1C 7.9 (A) 03/14/2024   HGBA1C 8.5 10/10/2019   Lab Results  Component Value Date   INSULIN  25.6 (H) 07/23/2022   Lab Results  Component Value Date   TSH 2.560 03/06/2024   CBC    Component Value Date/Time   WBC 6.8 01/19/2024 1320   RBC 5.15 01/19/2024 1320   HGB 12.0 (L) 01/19/2024 1320   HGB 12.1 (L) 11/29/2023 1045   HCT 39.1 01/19/2024 1320   HCT 39.6 11/29/2023 1045   PLT 195 01/19/2024 1320   PLT 212 11/29/2023 1045   MCV 75.9 (L) 01/19/2024 1320   MCV 78 (L) 11/29/2023 1045   MCH 23.3 (L) 01/19/2024 1320   MCHC 30.7 (L) 01/19/2024 1320   RDW 15.5 (H) 01/19/2024 1320   RDW 16.9 (H) 11/29/2023 1045   Iron Studies    Component Value Date/Time   IRON 92 09/02/2023 0543   IRON 54 07/23/2022 0921   TIBC 275 09/02/2023 0543   TIBC 285 07/23/2022 0921   FERRITIN 100 09/02/2023 0543   FERRITIN 124 07/23/2022 0921   IRONPCTSAT 34 09/02/2023 0543   IRONPCTSAT 19 07/23/2022 0921   IRONPCTSAT 24 05/25/2016 0814   Lipid Panel     Component Value Date/Time   CHOL 114 09/03/2023 0311   CHOL 151 07/16/2023 1301   CHOL 163 05/11/2022 1217   TRIG 103 09/03/2023 0311   TRIG 125 05/11/2022 1217   HDL 38 (L) 09/03/2023 0311   HDL 46 07/16/2023 1301   CHOLHDL 3.0 09/03/2023 0311   VLDL 21 09/03/2023 0311   LDLCALC 55 09/03/2023 0311   LDLCALC 91 07/16/2023 1301   LDLCALC 74 03/18/2020 1321   Hepatic Function Panel     Component Value Date/Time   PROT 6.5 03/06/2024 1031   ALBUMIN 4.0 03/06/2024 1031   AST 19 03/06/2024 1031   ALT 17 03/06/2024 1031   ALKPHOS 105 03/06/2024 1031   BILITOT 0.3 03/06/2024 1031   BILIDIR 0.1 05/14/2017 1333   IBILI 0.2 05/14/2017 1333      Component Value  Date/Time   TSH 2.560 03/06/2024 1031   Nutritional Lab Results  Component Value Date   VD25OH 75.1 03/02/2024   VD25OH 59.0 05/12/2023   VD25OH 35.4 03/17/2023    Attestations:   I, Sonny Laroche, acting as a stage manager for Gerald Jenkins, DO., have compiled all relevant documentation for today's office visit on behalf of Gerald Jenkins, DO, while in the presence of [employer], DO.  I have reviewed the above documentation for accuracy and completeness, and I agree with the above. Gerald Evans, D.O.  The 21st Century Cures Act was signed into law in 2016 which includes the topic of electronic health records.  This provides immediate access to information in MyChart.  This includes consultation notes, operative notes, office notes, lab results and pathology reports.  If you have any questions about what you read please let us  know at your next visit so we can discuss your concerns and take corrective action if need be.  We are right here with you.  "

## 2024-07-07 ENCOUNTER — Other Ambulatory Visit: Payer: Self-pay

## 2024-07-07 LAB — VITAMIN D 25 HYDROXY (VIT D DEFICIENCY, FRACTURES): Vit D, 25-Hydroxy: 46.3 ng/mL (ref 30.0–100.0)

## 2024-07-07 LAB — HEMOGLOBIN A1C
Est. average glucose Bld gHb Est-mCnc: 252 mg/dL
Hgb A1c MFr Bld: 10.4 % — ABNORMAL HIGH (ref 4.8–5.6)

## 2024-07-07 LAB — BASIC METABOLIC PANEL WITH GFR
BUN/Creatinine Ratio: 16 (ref 10–24)
BUN: 21 mg/dL (ref 8–27)
CO2: 21 mmol/L (ref 20–29)
Calcium: 9.5 mg/dL (ref 8.6–10.2)
Chloride: 104 mmol/L (ref 96–106)
Creatinine, Ser: 1.34 mg/dL — ABNORMAL HIGH (ref 0.76–1.27)
Glucose: 100 mg/dL — ABNORMAL HIGH (ref 70–99)
Potassium: 3.9 mmol/L (ref 3.5–5.2)
Sodium: 142 mmol/L (ref 134–144)
eGFR: 55 mL/min/1.73 — ABNORMAL LOW

## 2024-07-07 NOTE — Telephone Encounter (Signed)
 Noted. Thanks.

## 2024-07-13 ENCOUNTER — Ambulatory Visit (INDEPENDENT_AMBULATORY_CARE_PROVIDER_SITE_OTHER): Payer: Self-pay | Admitting: Family Medicine

## 2024-07-14 ENCOUNTER — Encounter: Payer: Self-pay | Admitting: Nurse Practitioner

## 2024-07-14 ENCOUNTER — Ambulatory Visit: Admitting: Nurse Practitioner

## 2024-07-14 VITALS — BP 116/74 | HR 88 | Ht 68.0 in | Wt 246.0 lb

## 2024-07-14 DIAGNOSIS — E559 Vitamin D deficiency, unspecified: Secondary | ICD-10-CM | POA: Diagnosis not present

## 2024-07-14 DIAGNOSIS — Z794 Long term (current) use of insulin: Secondary | ICD-10-CM | POA: Diagnosis not present

## 2024-07-14 DIAGNOSIS — E782 Mixed hyperlipidemia: Secondary | ICD-10-CM | POA: Diagnosis not present

## 2024-07-14 DIAGNOSIS — I10 Essential (primary) hypertension: Secondary | ICD-10-CM

## 2024-07-14 DIAGNOSIS — Z7985 Long-term (current) use of injectable non-insulin antidiabetic drugs: Secondary | ICD-10-CM | POA: Diagnosis not present

## 2024-07-14 DIAGNOSIS — Z7984 Long term (current) use of oral hypoglycemic drugs: Secondary | ICD-10-CM

## 2024-07-14 DIAGNOSIS — E038 Other specified hypothyroidism: Secondary | ICD-10-CM

## 2024-07-14 DIAGNOSIS — E1165 Type 2 diabetes mellitus with hyperglycemia: Secondary | ICD-10-CM

## 2024-07-14 DIAGNOSIS — E039 Hypothyroidism, unspecified: Secondary | ICD-10-CM | POA: Diagnosis not present

## 2024-07-14 MED ORDER — LEVOTHYROXINE SODIUM 75 MCG PO TABS: 75.0000 ug | ORAL_TABLET | Freq: Every day | ORAL | 3 refills | Status: AC

## 2024-07-14 MED ORDER — DEXCOM G7 RECEIVER DEVI: 1.0000 | Freq: Once | 0 refills | Status: AC

## 2024-07-14 MED ORDER — DEXCOM G7 SENSOR MISC: 1.0000 | 3 refills | Status: AC

## 2024-07-14 NOTE — Patient Instructions (Signed)

## 2024-07-14 NOTE — Progress Notes (Signed)
 "                                                                               07/14/2024, 11:30 AM  Endocrinology follow-up note   Subjective:    Patient ID: Gerald Evans, male    DOB: [birth year].  Gerald Evans is being seen in follow-up after he was seen in consultation for management of currently uncontrolled symptomatic diabetes requested by  Bevely Doffing, FNP.   Past Medical History:  Diagnosis Date   CHF (congestive heart failure) (HCC)    Colon polyps    Diabetes mellitus without complication (HCC)    Edema of both lower extremities    Fatty liver    Hypercholesteremia    Hypertension     Past Surgical History:  Procedure Laterality Date   COLONOSCOPY     COLONOSCOPY  04/21/2012   Procedure: COLONOSCOPY;  Surgeon: Claudis RAYMOND Rivet, MD;  [facility]: [facility];  Service: Endoscopy;  Laterality: N/A;  1200   COLONOSCOPY N/A 09/09/2017   Procedure: COLONOSCOPY;  Surgeon: Rivet Claudis RAYMOND, MD;  Location: [facility];  Service: Endoscopy;  Laterality: N/A;  1200-rescheduled to 3/14/ @12 :00pm per Jenkins   HERNIA REPAIR     POLYPECTOMY  09/09/2017   Procedure: POLYPECTOMY;  Surgeon: Rivet Claudis RAYMOND, MD;  Location: [facility];  Service: Endoscopy;;  Transverse Colon (CB)    Social History   Socioeconomic History   Marital status: Married    Spouse name: Not on file   Number of children: 2   Years of education: Not on file   Highest education level: Not on file  Occupational History   Occupation: retired  Tobacco Use   Smoking status: Former    Current packs/day: 0.50    Average packs/day: 0.5 packs/day for 20.0 years (10.0 ttl pk-yrs)    Types: Cigarettes   Smokeless tobacco: Never  Vaping Use   Vaping status: Never Used  Substance and Sexual Activity   Alcohol  use: Not Currently   Drug use: No   Sexual activity: Not on file  Other Topics Concern   Not on file  Social History Narrative   Not on file   Social Drivers of Health   Tobacco Use:  Medium Risk (07/14/2024)   Patient History    Smoking Tobacco Use: Former    Smokeless Tobacco Use: Never    Passive Exposure: Not on file  Financial Resource Strain: Low Risk (11/18/2022)   Overall Financial Resource Strain (CARDIA)    Difficulty of Paying Living Expenses: Not hard at all  Food Insecurity: No Food Insecurity (09/06/2023)   Hunger Vital Sign    Worried About Running Out of Food in the Last Year: Never true    Ran Out of Food in the Last Year: Never true  Transportation Needs: No Transportation Needs (09/06/2023)   PRAPARE - Administrator, Civil Service (Medical): No    Lack of Transportation (Non-Medical): No  Physical Activity: Sufficiently Active (11/18/2022)   Exercise Vital Sign    Days of Exercise per Week: 7 days    Minutes of Exercise per Session: 30 min  Stress: No Stress Concern Present (11/18/2022)  [employer] of Occupational Health - Occupational Stress Questionnaire    Feeling of Stress : Not at all  Social Connections: Socially Integrated (09/02/2023)   Social Connection and Isolation Panel    Frequency of Communication with Friends and Family: More than three times a week    Frequency of Social Gatherings with Friends and Family: More than three times a week    Attends Religious Services: More than 4 times per year    Active Member of Clubs or Organizations: Yes    Attends Banker Meetings: More than 4 times per year    Marital Status: Married  Depression (PHQ2-9): Low Risk (05/17/2024)   Depression (PHQ2-9)    PHQ-2 Score: 0  Alcohol  Screen: Low Risk (11/18/2022)   Alcohol  Screen    Last Alcohol  Screening Score (AUDIT): 0  Housing: Low Risk (09/06/2023)   Housing Stability Vital Sign    Unable to Pay for Housing in the Last Year: No    Number of Times Moved in the Last Year: 0    Homeless in the Last Year: No  Utilities: Not At Risk (09/06/2023)   AHC Utilities    Threatened with loss of utilities: No  Health Literacy:  Not on file    Family History  Problem Relation Age of Onset   Hypertension Mother    Hypertension Father    Alcoholism Father    Obesity Father    Diabetes Sister    Hypertension Sister    Healthy Daughter    Healthy Son    Colon cancer Neg Hx     Outpatient Encounter Medications as of 07/14/2024  Medication Sig   atorvastatin  (LIPITOR) 20 MG tablet Take 1 tablet (20 mg total) by mouth daily.   blood glucose meter kit and supplies Dispense based on patient and insurance preference. Use up to four times daily as directed. (FOR ICD-10 E11.65)   chlorthalidone  (HYGROTON ) 25 MG tablet Take 1 tablet (25 mg total) by mouth daily.   Continuous Glucose Receiver (DEXCOM G7 RECEIVER) DEVI 1 Device by Does not apply route once for 1 dose.   Continuous Glucose Sensor (DEXCOM G7 SENSOR) MISC Inject 1 Application into the skin as directed. Change sensor every 10 days as directed.   famotidine  (PEPCID ) 20 MG tablet Take 1 tablet (20 mg total) by mouth daily.   glucose blood (ACCU-CHEK GUIDE TEST) test strip Use to check blood glucose twice daily as instructed   hydrALAZINE  (APRESOLINE ) 25 MG tablet Take 1 tablet (25 mg total) by mouth 3 (three) times daily.   insulin  aspart protamine - aspart (NOVOLOG  70/30 FLEXPEN) (70-30) 100 UNIT/ML FlexPen Inject 40 Units into the skin 2 (two) times daily with a meal.   insulin  isophane & regular human KwikPen (NOVOLIN  70/30 KWIKPEN) (70-30) 100 UNIT/ML KwikPen Inject 40 Units into the skin 2 (two) times daily before a meal.   Insulin  Pen Needle (BD PEN NEEDLE NANO 2ND GEN) 32G X 4 MM MISC 1 Package by Does not apply route 2 (two) times daily.   losartan  (COZAAR ) 100 MG tablet Take 1 tablet (100 mg total) by mouth daily.   meloxicam  (MOBIC ) 7.5 MG tablet Take 1 tablet (7.5 mg total) by mouth daily as needed for pain.   metoprolol  tartrate (LOPRESSOR ) 100 MG tablet Take 1 tablet (100 mg total) by mouth 2 (two) times daily.   OneTouch Delica Lancets 30G MISC 1  each by Does not apply route 3 (three) times daily. Use to check blood glucose  three times daily   OVER THE COUNTER MEDICATION daily. [place] Multivitamin with Iron   Resmetirom  (REZDIFFRA ) 100 MG TABS Take 100 mg by mouth daily.   sildenafil  (VIAGRA ) 100 MG tablet Take 1/2 tab in combination with 10mg  of tadalafil .   tadalafil  (CIALIS ) 20 MG tablet Use either 1 tab po daily prn or 1/2 tab po with 1/2 tab sildenafil  100mg  po daily prn   Vitamin D , Ergocalciferol , (DRISDOL ) 1.25 MG (50000 UNIT) CAPS capsule 1 po q 10 days   [DISCONTINUED] levothyroxine  (SYNTHROID ) 75 MCG tablet TAKE 1 TABLET BY MOUTH ONCE DAILY BEFORE BREAKFAST   levothyroxine  (SYNTHROID ) 75 MCG tablet Take 1 tablet (75 mcg total) by mouth daily before breakfast.   [DISCONTINUED] tirzepatide  (MOUNJARO ) 2.5 MG/0.5ML Pen Inject 2.5 mg into the skin once a week. (Patient not taking: Reported on 07/14/2024)   No facility-administered encounter medications on file as of 07/14/2024.    ALLERGIES: Allergies  Allergen Reactions   Lisinopril Cough    VACCINATION STATUS: Immunization History  Administered Date(s) Administered   Fluad Trivalent(High Dose 65+) 05/07/2023   INFLUENZA, HIGH DOSE SEASONAL PF 05/17/2024   PFIZER Comirnaty(Gray Top)Covid-19 Tri-Sucrose Vaccine 11/12/2020   PFIZER(Purple Top)SARS-COV-2 Vaccination 08/20/2019, 09/12/2019, 04/25/2020   PNEUMOCOCCAL CONJUGATE-20 01/06/2022   Pfizer Covid-19 Vaccine Bivalent Booster 19yrs & up 07/17/2021   Pneumococcal Polysaccharide-23 05/10/2018   Tdap 12/20/2017   Zoster Recombinant(Shingrix) 01/06/2022, 03/18/2022    Diabetes He presents for his follow-up diabetic visit. He has type 2 diabetes mellitus. Onset time: He was diagnosed at approximate age of 18 years. His disease course has been worsening. There are no hypoglycemic associated symptoms. Pertinent negatives for hypoglycemia include no confusion, nervousness/anxiousness, pallor or seizures. Associated  symptoms include fatigue. Pertinent negatives for diabetes include no polydipsia, no polyphagia, no polyuria, no weakness and no weight loss. There are no hypoglycemic complications. Symptoms are stable. Diabetic complications include heart disease, impotence and nephropathy. Risk factors for coronary artery disease include dyslipidemia, diabetes mellitus, family history, tobacco exposure, sedentary lifestyle, male sex, obesity and hypertension. Current diabetic treatment includes oral agent (monotherapy) and insulin  injections (and Ozempic ). He is compliant with treatment most of the time. His weight is fluctuating minimally. He is following a generally unhealthy diet. When asked about meal planning, he reported none. He has not had a previous visit with a dietitian. He participates in exercise three times a week. His home blood glucose trend is fluctuating dramatically. His overall blood glucose range is 140-180 mg/dl. (He presents today with his meter, no logs, showing inconsistent glucose monitoring (only checking glucose on average once daily), with fluctuating and mostly above target glycemic profile.  His most recent A1c on 1/8 was 10.4%, increasing from last visit of 7.9%.  Analysis of his meter shows 7-day average of 140, 14-day average of 158, 30-day average of 184 with glucose ranging between 78-473.  He admits he has been indulging in foods over the holidays that have more sugar.  He has been taking his insulin  at dinner without checking his glucose first.) An ACE inhibitor/angiotensin II receptor blocker is being taken. He does not see a podiatrist.Eye exam is current.  Thyroid Problem Presents for follow-up visit. Symptoms include fatigue. Patient reports no anxiety, cold intolerance, constipation, depressed mood, hair loss, leg swelling, weight gain or weight loss. The symptoms have been improving.    Review of systems  Constitutional: + decreasing body weight,  current Body mass index is 37.4  kg/m. , no fatigue, no subjective hyperthermia, no subjective  hypothermia Eyes: no blurry vision, no xerophthalmia ENT: no sore throat, no nodules palpated in throat, no dysphagia/odynophagia, no hoarseness Cardiovascular: no chest pain, no shortness of breath, no palpitations, no leg swelling Respiratory: no cough, no shortness of breath Gastrointestinal: no nausea/vomiting/diarrhea Musculoskeletal: no muscle/joint aches Skin: no rashes, no hyperemia Neurological: no tremors, no numbness, no tingling, no dizziness Psychiatric: no depression, no anxiety  Objective:    BP 116/74 (BP Location: Right Arm, Patient Position: Sitting, Cuff Size: Large)   Pulse 88   Ht 5' 8 (1.727 m)   Wt 246 lb (111.6 kg)   BMI 37.40 kg/m   Wt Readings from Last 3 Encounters:  07/14/24 246 lb (111.6 kg)  07/06/24 244 lb (110.7 kg)  05/17/24 250 lb 0.6 oz (113.4 kg)    BP Readings from Last 3 Encounters:  07/14/24 116/74  07/06/24 (!) 142/64  05/17/24 (!) 115/59     Physical Exam- Limited  Constitutional:  Body mass index is 37.4 kg/m. , not in acute distress, normal state of mind Eyes:  EOMI, no exophthalmos Musculoskeletal: no gross deformities, strength intact in all four extremities, no gross restriction of joint movements Skin:  no rashes, no hyperemia Neurological: no tremor with outstretched hands   Diabetic Foot Exam - Simple   No data filed     CMP ( most recent) CMP     Component Value Date/Time   NA 142 07/06/2024 1228   K 3.9 07/06/2024 1228   CL 104 07/06/2024 1228   CO2 21 07/06/2024 1228   GLUCOSE 100 (H) 07/06/2024 1228   GLUCOSE 140 (H) 01/19/2024 1320   BUN 21 07/06/2024 1228   CREATININE 1.34 (H) 07/06/2024 1228   CREATININE 1.43 (H) 01/19/2024 1320   CALCIUM  9.5 07/06/2024 1228   PROT 6.5 03/06/2024 1031   ALBUMIN 4.0 03/06/2024 1031   AST 19 03/06/2024 1031   ALT 17 03/06/2024 1031   ALKPHOS 105 03/06/2024 1031   BILITOT 0.3 03/06/2024 1031   GFRNONAA  57 (L) 09/04/2023 1413   GFRNONAA 74 03/18/2020 1321   GFRAA 67 07/17/2020 0953   GFRAA 86 03/18/2020 1321    Diabetic Labs (most recent): Lab Results  Component Value Date   HGBA1C 10.4 (H) 07/06/2024   HGBA1C 7.9 (A) 03/14/2024   HGBA1C 8.2 (A) 10/21/2023   MICROALBUR 80mg /L 04/15/2023   MICROALBUR 80 mg/l 05/11/2022   MICROALBUR 150 04/15/2021     Assessment & Plan:   1) Uncontrolled type 2 diabetes mellitus with hyperglycemia (HCC)  - Gerald Evans has currently uncontrolled symptomatic type 2 DM since  79 years of age.  He presents today with his meter, no logs, showing inconsistent glucose monitoring (only checking glucose on average once daily), with fluctuating and mostly above target glycemic profile.  His most recent A1c on 1/8 was 10.4%, increasing from last visit of 7.9%.  Analysis of his meter shows 7-day average of 140, 14-day average of 158, 30-day average of 184 with glucose ranging between 78-473.  He admits he has been indulging in foods over the holidays that have more sugar.  He has been taking his insulin  at dinner without checking his glucose first.   Recent labs reviewed.    - I had a long discussion with him about the progressive nature of diabetes and the pathology behind its complications.  -his diabetes is complicated by obesity/sedentary life and he remains at a high risk for more acute and chronic complications which include CAD, CVA, CKD, retinopathy,  and neuropathy. These are all discussed in detail with him.  - Nutritional counseling repeated/built upon at each appointment.  - The patient admits there is a room for improvement in their diet and drink choices. -  Suggestion is made for the patient to avoid simple carbohydrates from their diet including Cakes, Sweet Desserts / Pastries, Ice Cream, Soda (diet and regular), Sweet Tea, Candies, Chips, Cookies, Sweet Pastries, Store Bought Juices, Alcohol  in Excess of 1-2 drinks a day, Artificial  Sweeteners, Coffee Creamer, and Sugar-free Products. This will help patient to have stable blood glucose profile and potentially avoid unintended weight gain.   - I encouraged the patient to switch to unprocessed or minimally processed complex starch and increased protein intake (animal or plant source), fruits, and vegetables.   - Patient is advised to stick to a routine mealtimes to eat 3 meals a day and avoid unnecessary snacks (to snack only to correct hypoglycemia).  - I have approached him with the following individualized plan to manage  his diabetes and patient agrees:   - he will continue to require insulin  treatment in order for him to achieve and maintain control of diabetes to target.    -He is benefiting from simplified insulin  regimen with premixed insulin .    -He is advised to continue his 70/30 40 units SQ BID with meals if glucose is above 90 and he is eating.  He needs to be consistent with checking evening glucose so I can make necessary adjustments to regimen to help him regain control.  Unfortunately, he is no longer able to get his Ozempic  from [facility].  He was prescribed Mounjaro  but it was too expensive.  I did advise him he could ask for split payments for this or go to the senior center to inquire about medication grant.  He also need to renew his patient assistance application for 2026.  I did forget to give him an application today, but will mail him one.  -He is encouraged to do better monitoring his blood glucose.  He is urged to monitor glucose at least twice per day, before injecting insulin  (at breakfast and supper) and call the clinic if readings are less than 70 or greater than 300 for 3 tests in a row.  He could benefit from CGM device and is willing to retry it.  - Specific targets for  A1c;  LDL, HDL,  and Triglycerides were discussed with the patient.  2) Blood Pressure /Hypertension:  His blood pressure is controlled to target for his age.  He is  advised to continue meds as prescribed by PCP.  3) Lipids/Hyperlipidemia:  His most recent lipid panel from 09/03/23 shows controlled LDL of 55.  He is advised to continue Simvastatin  20 mg po daily at bedtime.  Side effects and precautions discussed with him.    4)  Weight/Diet:  His Body mass index is 37.4 kg/m.  -   clearly complicating his diabetes care.   he is  a candidate for weight loss. I discussed with him the fact that loss of 5 - 10% of his  current body weight will have the most impact on his diabetes management.  Exercise, and detailed carbohydrates information provided  -  detailed on discharge instructions.  5) Hypothyroidism-acquired He does not have personal or family history of thyroid dysfunction.  His antibody testing was negative ruling out autoimmune thyroid dysfunction.    There are no recent TFTs to review.  He is advised to continue his Levothyroxine   75 mcg po daily before breakfast.  Will recheck TFTs prior to next visit and adjust dose accordingly.   - The correct intake of thyroid hormone (Levothyroxine , Synthroid ), is on empty stomach first thing in the morning, with water , separated by at least 30 minutes from breakfast and other medications,  and separated by more than 4 hours from calcium , iron, multivitamins, acid reflux medications (PPIs).  - This medication is a life-long medication and will be needed to correct thyroid hormone imbalances for the rest of your life.  The dose may change from time to time, based on thyroid blood work.  - It is extremely important to be consistent taking this medication, near the same time each morning.  -AVOID TAKING PRODUCTS CONTAINING BIOTIN (commonly found in Hair, Skin, Nails vitamins) AS IT INTERFERES WITH THE VALIDITY OF THYROID FUNCTION BLOOD TESTS.  6) Chronic Care/Health Maintenance: -he is on ACEI/ARB and Statin medications and is encouraged to initiate and continue to follow up with Ophthalmology, Dentist,  Podiatrist  at least yearly or according to recommendations, and advised to  stay away from smoking. I have recommended yearly flu vaccine and pneumonia vaccine at least every 5 years; moderate intensity exercise for up to 150 minutes weekly; and  sleep for at least 7 hours a day.  - he is advised to maintain close follow up with Bevely Doffing, FNP for primary care needs, as well as his other providers for optimal and coordinated care.     I spent  51  minutes in the care of the patient today including review of labs from CMP, Lipids, Thyroid Function, Hematology (current and previous including abstractions from other facilities); face-to-face time discussing  his blood glucose readings/logs, discussing hypoglycemia and hyperglycemia episodes and symptoms, medications doses, his options of short and long term treatment based on the latest standards of care / guidelines;  discussion about incorporating lifestyle medicine;  and documenting the encounter. Risk reduction counseling performed per USPSTF guidelines to reduce obesity and cardiovascular risk factors.     Please refer to Patient Instructions for Blood Glucose Monitoring and Insulin /Medications Dosing Guide  in media tab for additional information. Please  also refer to  Patient Self Inventory in the Media  tab for reviewed elements of pertinent patient history.  Gerald Evans participated in the discussions, expressed understanding, and voiced agreement with the above plans.  All questions were answered to his satisfaction. he is encouraged to contact clinic should he have any questions or concerns prior to his return visit.   Follow up plan: - Return in about 3 months (around 10/12/2024) for Diabetes F/U with A1c in office, No previsit labs, Bring meter and logs.  Benton Rio, [facility] [address] [place], [place] [address] Phone: [phone] Fax: [phone]  07/14/2024, 11:30 AM    "

## 2024-07-30 ENCOUNTER — Other Ambulatory Visit: Payer: Self-pay | Admitting: Nurse Practitioner

## 2024-07-30 DIAGNOSIS — Z794 Long term (current) use of insulin: Secondary | ICD-10-CM

## 2024-07-30 DIAGNOSIS — Z7985 Long-term (current) use of injectable non-insulin antidiabetic drugs: Secondary | ICD-10-CM

## 2024-07-30 DIAGNOSIS — E1165 Type 2 diabetes mellitus with hyperglycemia: Secondary | ICD-10-CM

## 2024-07-30 DIAGNOSIS — Z7984 Long term (current) use of oral hypoglycemic drugs: Secondary | ICD-10-CM

## 2024-07-30 DIAGNOSIS — E559 Vitamin D deficiency, unspecified: Secondary | ICD-10-CM

## 2024-07-30 DIAGNOSIS — E782 Mixed hyperlipidemia: Secondary | ICD-10-CM

## 2024-07-30 DIAGNOSIS — I10 Essential (primary) hypertension: Secondary | ICD-10-CM

## 2024-07-30 DIAGNOSIS — E038 Other specified hypothyroidism: Secondary | ICD-10-CM

## 2024-07-30 DIAGNOSIS — E039 Hypothyroidism, unspecified: Secondary | ICD-10-CM

## 2024-08-03 ENCOUNTER — Ambulatory Visit (INDEPENDENT_AMBULATORY_CARE_PROVIDER_SITE_OTHER): Admitting: Family Medicine

## 2024-08-04 ENCOUNTER — Encounter (INDEPENDENT_AMBULATORY_CARE_PROVIDER_SITE_OTHER): Payer: Self-pay | Admitting: Gastroenterology

## 2024-08-16 ENCOUNTER — Ambulatory Visit (INDEPENDENT_AMBULATORY_CARE_PROVIDER_SITE_OTHER): Admitting: Family Medicine

## 2024-09-25 ENCOUNTER — Ambulatory Visit (INDEPENDENT_AMBULATORY_CARE_PROVIDER_SITE_OTHER): Admitting: Gastroenterology

## 2024-10-20 ENCOUNTER — Ambulatory Visit: Admitting: Nurse Practitioner

## 2024-11-15 ENCOUNTER — Ambulatory Visit
